# Patient Record
Sex: Male | Born: 1940 | Race: White | Hispanic: No | Marital: Married | Smoking: Never smoker
Health system: Southern US, Community
[De-identification: ages and names within clinical notes are randomized; demographics above are authoritative.]

## PROBLEM LIST (undated history)

## (undated) DIAGNOSIS — M1990 Unspecified osteoarthritis, unspecified site: Secondary | ICD-10-CM

## (undated) DIAGNOSIS — K219 Gastro-esophageal reflux disease without esophagitis: Secondary | ICD-10-CM

## (undated) DIAGNOSIS — Z8639 Personal history of other endocrine, nutritional and metabolic disease: Secondary | ICD-10-CM

## (undated) DIAGNOSIS — Z87442 Personal history of urinary calculi: Secondary | ICD-10-CM

## (undated) DIAGNOSIS — M1712 Unilateral primary osteoarthritis, left knee: Secondary | ICD-10-CM

## (undated) DIAGNOSIS — F0390 Unspecified dementia without behavioral disturbance: Secondary | ICD-10-CM

## (undated) DIAGNOSIS — K589 Irritable bowel syndrome without diarrhea: Secondary | ICD-10-CM

## (undated) DIAGNOSIS — I10 Essential (primary) hypertension: Secondary | ICD-10-CM

## (undated) DIAGNOSIS — K449 Diaphragmatic hernia without obstruction or gangrene: Secondary | ICD-10-CM

## (undated) DIAGNOSIS — I480 Paroxysmal atrial fibrillation: Secondary | ICD-10-CM

## (undated) DIAGNOSIS — Z9989 Dependence on other enabling machines and devices: Secondary | ICD-10-CM

## (undated) DIAGNOSIS — N200 Calculus of kidney: Secondary | ICD-10-CM

## (undated) DIAGNOSIS — N201 Calculus of ureter: Secondary | ICD-10-CM

## (undated) DIAGNOSIS — Z8709 Personal history of other diseases of the respiratory system: Secondary | ICD-10-CM

## (undated) DIAGNOSIS — G4733 Obstructive sleep apnea (adult) (pediatric): Secondary | ICD-10-CM

## (undated) HISTORY — DX: Unspecified dementia, unspecified severity, without behavioral disturbance, psychotic disturbance, mood disturbance, and anxiety: F03.90

## (undated) HISTORY — PX: EXTRACORPOREAL SHOCK WAVE LITHOTRIPSY: SHX1557

## (undated) HISTORY — PX: TONSILLECTOMY: SUR1361

## (undated) HISTORY — PX: KIDNEY STONE SURGERY: SHX686

## (undated) HISTORY — PX: CARPAL TUNNEL RELEASE: SHX101

## (undated) HISTORY — DX: Irritable bowel syndrome, unspecified: K58.9

## (undated) HISTORY — PX: CATARACT EXTRACTION W/ INTRAOCULAR LENS  IMPLANT, BILATERAL: SHX1307

---

## 1965-06-30 HISTORY — PX: THYROID LOBECTOMY: SHX420

## 1998-01-07 ENCOUNTER — Inpatient Hospital Stay (HOSPITAL_COMMUNITY): Admission: EM | Admit: 1998-01-07 | Discharge: 1998-01-09 | Payer: Self-pay

## 1999-08-01 ENCOUNTER — Ambulatory Visit (HOSPITAL_COMMUNITY): Admission: RE | Admit: 1999-08-01 | Discharge: 1999-08-01 | Payer: Self-pay | Admitting: Urology

## 1999-08-01 ENCOUNTER — Encounter: Payer: Self-pay | Admitting: Urology

## 1999-08-05 ENCOUNTER — Emergency Department (HOSPITAL_COMMUNITY): Admission: EM | Admit: 1999-08-05 | Discharge: 1999-08-05 | Payer: Self-pay | Admitting: Emergency Medicine

## 1999-08-06 ENCOUNTER — Encounter: Payer: Self-pay | Admitting: Emergency Medicine

## 2000-06-30 HISTORY — PX: OTHER SURGICAL HISTORY: SHX169

## 2001-01-26 ENCOUNTER — Encounter: Admission: RE | Admit: 2001-01-26 | Discharge: 2001-01-26 | Payer: Self-pay | Admitting: Gastroenterology

## 2001-01-26 ENCOUNTER — Encounter: Payer: Self-pay | Admitting: Gastroenterology

## 2003-05-23 ENCOUNTER — Encounter: Admission: RE | Admit: 2003-05-23 | Discharge: 2003-05-23 | Payer: Self-pay | Admitting: Gastroenterology

## 2004-06-30 HISTORY — PX: TRIGGER FINGER RELEASE: SHX641

## 2005-06-30 HISTORY — PX: OTHER SURGICAL HISTORY: SHX169

## 2006-03-26 ENCOUNTER — Inpatient Hospital Stay (HOSPITAL_COMMUNITY): Admission: RE | Admit: 2006-03-26 | Discharge: 2006-03-27 | Payer: Self-pay | Admitting: Orthopedic Surgery

## 2011-08-28 ENCOUNTER — Other Ambulatory Visit: Payer: Self-pay | Admitting: Orthopedic Surgery

## 2011-09-26 ENCOUNTER — Encounter (HOSPITAL_COMMUNITY): Payer: Self-pay | Admitting: Pharmacy Technician

## 2011-10-02 ENCOUNTER — Other Ambulatory Visit: Payer: Self-pay

## 2011-10-02 ENCOUNTER — Encounter (HOSPITAL_COMMUNITY)
Admission: RE | Admit: 2011-10-02 | Discharge: 2011-10-02 | Disposition: A | Discharge status: Home / Self Care | Payer: Medicare Other | Source: Ambulatory Visit | Attending: Orthopedic Surgery | Admitting: Orthopedic Surgery

## 2011-10-02 ENCOUNTER — Encounter (HOSPITAL_COMMUNITY): Payer: Self-pay

## 2011-10-02 ENCOUNTER — Encounter (HOSPITAL_COMMUNITY)
Admission: RE | Admit: 2011-10-02 | Discharge: 2011-10-02 | Disposition: A | Discharge status: Home / Self Care | Payer: Medicare Other | Source: Ambulatory Visit | Attending: Anesthesiology | Admitting: Anesthesiology

## 2011-10-02 HISTORY — DX: Gastro-esophageal reflux disease without esophagitis: K21.9

## 2011-10-02 HISTORY — DX: Essential (primary) hypertension: I10

## 2011-10-02 HISTORY — DX: Unspecified osteoarthritis, unspecified site: M19.90

## 2011-10-02 LAB — CBC
Platelets: 347 10*3/uL (ref 150–400)
RBC: 4.93 MIL/uL (ref 4.22–5.81)
WBC: 7.5 10*3/uL (ref 4.0–10.5)

## 2011-10-02 LAB — BASIC METABOLIC PANEL
Calcium: 9 mg/dL (ref 8.4–10.5)
GFR calc non Af Amer: 68 mL/min — ABNORMAL LOW (ref >90)
Potassium: 3.1 mEq/L — ABNORMAL LOW (ref 3.5–5.1)
Sodium: 140 mEq/L (ref 135–145)

## 2011-10-02 LAB — SEDIMENTATION RATE: Sed Rate: 14 mm/hr (ref 0–16)

## 2011-10-02 LAB — TYPE AND SCREEN
ABO/RH(D): O POS
Antibody Screen: NEGATIVE

## 2011-10-02 LAB — SURGICAL PCR SCREEN: MRSA, PCR: NEGATIVE

## 2011-10-02 MED ORDER — CHLORHEXIDINE GLUCONATE 4 % EX LIQD: 60.0000 mL | Freq: Once | CUTANEOUS | Status: DC

## 2011-10-02 NOTE — Progress Notes (Signed)
Spoke with Nicki Guadalajara at dr. Karma Greaser office ... She will fax sleep study .

## 2011-10-02 NOTE — Pre-Procedure Instructions (Signed)
20 Darrell Perez  10/02/2011  Your procedure is scheduled on:  10/09/11  Report to [facility] at 800amAM.  Call this number if you have problems the morning of surgery: [phone]   Remember:   Do not eat food:After Midnight.  May have clear liquids: up to 4 Hours before arrival.do not drink liquids after 400am the day of surgery  Clear liquids include soda, tea, black coffee, apple or grape juice, broth.  Take these medicines the morning of surgery with A SIP OF WATER: hydrocodone(vicodin)  lozol  protonix  pindolol   Do not wear jewelry, make-up or nail polish.  Do not wear lotions, powders, or perfumes. You may wear deodorant.  Do not shave 48 hours prior to surgery.  Do not bring valuables to the hospital.  Contacts, dentures or bridgework may not be worn into surgery.  Leave suitcase in the car. After surgery it may be brought to your room.  For patients admitted to the hospital, checkout time is 11:00 AM the day of discharge.   Patients discharged the day of surgery will not be allowed to drive home.  Name and phone number of your driver: [name]  [phone]  Special Instructions: CHG Shower Use Special Wash: 1/2 bottle night before surgery and 1/2 bottle morning of surgery.   Please read over the following fact sheets that you were given: Pain Booklet, Coughing and Deep Breathing, MRSA Information and Surgical Site Infection Prevention

## 2011-10-03 NOTE — Consult Note (Signed)
Anesthesia:  Patient is a 71 year old male scheduled for a right shoulder revision arthroplasty possible reverse shoulder arthroplasty on 10/09/11.  History includes HTN, OSA, GERD, arthritis, HH, kidney stones, asthma.  Smoking status was not assessed at PAT.  Labs noted. K 3.1  Cr 1.08. H/H 12.8/39.7.  CXR on 10/02/11 showed stable cardiopulmonary appearance with no new focal or acute abnormality seen.  EKG on 10/02/11 showed SB at 50 bpm, LVH.  It was not felt significantly changed form her EKGs from 2001 or 2007.    Plan to proceed.

## 2011-10-08 MED ORDER — CEFAZOLIN SODIUM-DEXTROSE 2-3 GM-% IV SOLR
2.0000 g | INTRAVENOUS | Status: AC
  Administered 2011-10-09: 2 g via INTRAVENOUS
  Filled 2011-10-08: qty 50

## 2011-10-09 ENCOUNTER — Encounter (HOSPITAL_COMMUNITY): Payer: Self-pay | Admitting: Vascular Surgery

## 2011-10-09 ENCOUNTER — Ambulatory Visit (HOSPITAL_COMMUNITY): Payer: Medicare Other | Admitting: Vascular Surgery

## 2011-10-09 ENCOUNTER — Encounter (HOSPITAL_COMMUNITY)
Admission: RE | Disposition: A | Discharge status: Home / Self Care | Payer: Self-pay | Source: Ambulatory Visit | Attending: Orthopedic Surgery

## 2011-10-09 ENCOUNTER — Encounter (HOSPITAL_COMMUNITY): Payer: Self-pay | Admitting: General Practice

## 2011-10-09 ENCOUNTER — Encounter (HOSPITAL_COMMUNITY): Payer: Self-pay | Admitting: Surgery

## 2011-10-09 ENCOUNTER — Inpatient Hospital Stay (HOSPITAL_COMMUNITY)
Admission: RE | Admit: 2011-10-09 | Discharge: 2011-10-10 | DRG: 517 | Disposition: A | Discharge status: Home / Self Care | Payer: Medicare Other | Source: Ambulatory Visit | Attending: Orthopedic Surgery | Admitting: Orthopedic Surgery

## 2011-10-09 DIAGNOSIS — M25519 Pain in unspecified shoulder: Secondary | ICD-10-CM | POA: Diagnosis present

## 2011-10-09 DIAGNOSIS — T8489XA Other specified complication of internal orthopedic prosthetic devices, implants and grafts, initial encounter: Principal | ICD-10-CM | POA: Diagnosis present

## 2011-10-09 DIAGNOSIS — N200 Calculus of kidney: Secondary | ICD-10-CM | POA: Diagnosis present

## 2011-10-09 DIAGNOSIS — Z01818 Encounter for other preprocedural examination: Secondary | ICD-10-CM

## 2011-10-09 DIAGNOSIS — Z96619 Presence of unspecified artificial shoulder joint: Secondary | ICD-10-CM

## 2011-10-09 DIAGNOSIS — Y849 Medical procedure, unspecified as the cause of abnormal reaction of the patient, or of later complication, without mention of misadventure at the time of the procedure: Secondary | ICD-10-CM | POA: Diagnosis present

## 2011-10-09 DIAGNOSIS — Z01810 Encounter for preprocedural cardiovascular examination: Secondary | ICD-10-CM

## 2011-10-09 DIAGNOSIS — Z01812 Encounter for preprocedural laboratory examination: Secondary | ICD-10-CM

## 2011-10-09 HISTORY — PX: REVERSE SHOULDER ARTHROPLASTY: SHX5054

## 2011-10-09 SURGERY — ARTHROPLASTY, SHOULDER, TOTAL, REVERSE
Anesthesia: General | Site: Shoulder | Laterality: Right | Wound class: Clean

## 2011-10-09 MED ORDER — INDAPAMIDE 2.5 MG PO TABS
2.5000 mg | ORAL_TABLET | Freq: Every day | ORAL | Status: DC
  Administered 2011-10-09 – 2011-10-10 (×2): 2.5 mg via ORAL
  Filled 2011-10-09 (×3): qty 1

## 2011-10-09 MED ORDER — ACETAMINOPHEN 10 MG/ML IV SOLN
INTRAVENOUS | Status: AC
  Filled 2011-10-09: qty 100

## 2011-10-09 MED ORDER — MIDAZOLAM HCL 2 MG/2ML IJ SOLN
INTRAMUSCULAR | Status: AC
  Filled 2011-10-09: qty 2

## 2011-10-09 MED ORDER — CEFAZOLIN SODIUM 1-5 GM-% IV SOLN
1.0000 g | Freq: Four times a day (QID) | INTRAVENOUS | Status: AC
  Administered 2011-10-09 – 2011-10-10 (×3): 1 g via INTRAVENOUS
  Filled 2011-10-09 (×4): qty 50

## 2011-10-09 MED ORDER — LACTATED RINGERS IV SOLN
INTRAVENOUS | Status: DC | PRN
  Administered 2011-10-09 (×3): via INTRAVENOUS

## 2011-10-09 MED ORDER — LIDOCAINE HCL 4 % MT SOLN
OROMUCOSAL | Status: DC | PRN
  Administered 2011-10-09: 4 mL via TOPICAL

## 2011-10-09 MED ORDER — ONDANSETRON HCL 4 MG PO TABS: 4.0000 mg | ORAL_TABLET | Freq: Four times a day (QID) | ORAL | Status: DC | PRN

## 2011-10-09 MED ORDER — ACETAMINOPHEN 325 MG PO TABS: 650.0000 mg | ORAL_TABLET | Freq: Four times a day (QID) | ORAL | Status: DC | PRN

## 2011-10-09 MED ORDER — LACTATED RINGERS IV SOLN
INTRAVENOUS | Status: DC
  Administered 2011-10-09: 50 mL/h via INTRAVENOUS

## 2011-10-09 MED ORDER — ALLOPURINOL 100 MG PO TABS
100.0000 mg | ORAL_TABLET | Freq: Every day | ORAL | Status: DC
  Administered 2011-10-09: 100 mg via ORAL
  Filled 2011-10-09 (×3): qty 1

## 2011-10-09 MED ORDER — LACTATED RINGERS IV SOLN
INTRAVENOUS | Status: DC
  Administered 2011-10-10: 02:00:00 via INTRAVENOUS

## 2011-10-09 MED ORDER — ACETAMINOPHEN 10 MG/ML IV SOLN
INTRAVENOUS | Status: DC | PRN
  Administered 2011-10-09: 1000 mg via INTRAVENOUS

## 2011-10-09 MED ORDER — PROPOFOL 10 MG/ML IV EMUL
INTRAVENOUS | Status: DC | PRN
  Administered 2011-10-09: 160 mg via INTRAVENOUS

## 2011-10-09 MED ORDER — ROCURONIUM BROMIDE 100 MG/10ML IV SOLN
INTRAVENOUS | Status: DC | PRN
  Administered 2011-10-09: 50 mg via INTRAVENOUS

## 2011-10-09 MED ORDER — PANTOPRAZOLE SODIUM 40 MG PO TBEC
40.0000 mg | DELAYED_RELEASE_TABLET | Freq: Every day | ORAL | Status: DC
  Administered 2011-10-09 – 2011-10-10 (×2): 40 mg via ORAL
  Filled 2011-10-09 (×2): qty 1

## 2011-10-09 MED ORDER — HYDROMORPHONE HCL PF 1 MG/ML IJ SOLN: 0.5000 mg | INTRAMUSCULAR | Status: DC | PRN

## 2011-10-09 MED ORDER — METHOCARBAMOL 500 MG PO TABS
500.0000 mg | ORAL_TABLET | Freq: Four times a day (QID) | ORAL | Status: DC | PRN
  Administered 2011-10-10: 500 mg via ORAL
  Filled 2011-10-09: qty 1

## 2011-10-09 MED ORDER — FENTANYL CITRATE 0.05 MG/ML IJ SOLN
INTRAMUSCULAR | Status: DC | PRN
  Administered 2011-10-09: 75 ug via INTRAVENOUS

## 2011-10-09 MED ORDER — METOCLOPRAMIDE HCL 5 MG/ML IJ SOLN: 5.0000 mg | Freq: Three times a day (TID) | INTRAMUSCULAR | Status: DC | PRN

## 2011-10-09 MED ORDER — SUFENTANIL CITRATE 50 MCG/ML IV SOLN
INTRAVENOUS | Status: DC | PRN
  Administered 2011-10-09 (×3): 10 ug via INTRAVENOUS

## 2011-10-09 MED ORDER — OXYCODONE-ACETAMINOPHEN 5-325 MG PO TABS
1.0000 | ORAL_TABLET | ORAL | Status: DC | PRN
  Administered 2011-10-10 (×2): 2 via ORAL
  Filled 2011-10-09 (×2): qty 2

## 2011-10-09 MED ORDER — MIDAZOLAM HCL 5 MG/5ML IJ SOLN
INTRAMUSCULAR | Status: DC | PRN
  Administered 2011-10-09: 1 mg via INTRAVENOUS
  Administered 2011-10-09: 2 mg via INTRAVENOUS

## 2011-10-09 MED ORDER — EPHEDRINE SULFATE 50 MG/ML IJ SOLN
INTRAMUSCULAR | Status: DC | PRN
  Administered 2011-10-09 (×5): 10 mg via INTRAVENOUS

## 2011-10-09 MED ORDER — ZOLPIDEM TARTRATE 5 MG PO TABS: 5.0000 mg | ORAL_TABLET | Freq: Every evening | ORAL | Status: DC | PRN

## 2011-10-09 MED ORDER — SODIUM CHLORIDE 0.9 % IR SOLN
Status: DC | PRN
  Administered 2011-10-09: 1000 mL

## 2011-10-09 MED ORDER — GLYCOPYRROLATE 0.2 MG/ML IJ SOLN
INTRAMUSCULAR | Status: DC | PRN
  Administered 2011-10-09: 0.4 mg via INTRAVENOUS
  Administered 2011-10-09: 0.2 mg via INTRAVENOUS

## 2011-10-09 MED ORDER — DEXAMETHASONE SODIUM PHOSPHATE 4 MG/ML IJ SOLN
INTRAMUSCULAR | Status: DC | PRN
  Administered 2011-10-09: 4 mg via INTRAVENOUS

## 2011-10-09 MED ORDER — METHOCARBAMOL 100 MG/ML IJ SOLN
500.0000 mg | Freq: Four times a day (QID) | INTRAVENOUS | Status: DC | PRN
  Filled 2011-10-09: qty 5

## 2011-10-09 MED ORDER — ACETAMINOPHEN 650 MG RE SUPP: 650.0000 mg | Freq: Four times a day (QID) | RECTAL | Status: DC | PRN

## 2011-10-09 MED ORDER — FENTANYL CITRATE 0.05 MG/ML IJ SOLN
INTRAMUSCULAR | Status: AC
  Filled 2011-10-09: qty 2

## 2011-10-09 MED ORDER — ASPIRIN EC 81 MG PO TBEC
81.0000 mg | DELAYED_RELEASE_TABLET | Freq: Every day | ORAL | Status: DC
  Administered 2011-10-09 – 2011-10-10 (×2): 81 mg via ORAL
  Filled 2011-10-09 (×2): qty 1

## 2011-10-09 MED ORDER — MENTHOL 3 MG MT LOZG: 1.0000 | LOZENGE | OROMUCOSAL | Status: DC | PRN

## 2011-10-09 MED ORDER — NEOSTIGMINE METHYLSULFATE 1 MG/ML IJ SOLN
INTRAMUSCULAR | Status: DC | PRN
  Administered 2011-10-09: 3 mg via INTRAVENOUS

## 2011-10-09 MED ORDER — ONDANSETRON HCL 4 MG/2ML IJ SOLN: 4.0000 mg | Freq: Four times a day (QID) | INTRAMUSCULAR | Status: DC | PRN

## 2011-10-09 MED ORDER — DROPERIDOL 2.5 MG/ML IJ SOLN
INTRAMUSCULAR | Status: DC | PRN
  Administered 2011-10-09: 0.625 mg via INTRAVENOUS

## 2011-10-09 MED ORDER — METOCLOPRAMIDE HCL 10 MG PO TABS: 5.0000 mg | ORAL_TABLET | Freq: Three times a day (TID) | ORAL | Status: DC | PRN

## 2011-10-09 MED ORDER — PINDOLOL 5 MG PO TABS
5.0000 mg | ORAL_TABLET | Freq: Every day | ORAL | Status: DC
  Administered 2011-10-09 – 2011-10-10 (×2): 5 mg via ORAL
  Filled 2011-10-09 (×2): qty 1

## 2011-10-09 MED ORDER — ONDANSETRON HCL 4 MG/2ML IJ SOLN
INTRAMUSCULAR | Status: DC | PRN
  Administered 2011-10-09: 4 mg via INTRAVENOUS

## 2011-10-09 MED ORDER — ALBUMIN HUMAN 5 % IV SOLN
INTRAVENOUS | Status: DC | PRN
  Administered 2011-10-09: 12:00:00 via INTRAVENOUS

## 2011-10-09 MED ORDER — DOCUSATE SODIUM 100 MG PO CAPS
100.0000 mg | ORAL_CAPSULE | Freq: Two times a day (BID) | ORAL | Status: DC
  Administered 2011-10-09 – 2011-10-10 (×2): 100 mg via ORAL
  Filled 2011-10-09 (×3): qty 1

## 2011-10-09 MED ORDER — PHENOL 1.4 % MT LIQD: 1.0000 | OROMUCOSAL | Status: DC | PRN

## 2011-10-09 MED ORDER — DIPHENHYDRAMINE HCL 12.5 MG/5ML PO ELIX: 12.5000 mg | ORAL_SOLUTION | ORAL | Status: DC | PRN

## 2011-10-09 SURGICAL SUPPLY — 81 items
BIT DRILL 170X2.5X (BIT) ×1 IMPLANT
BIT DRL 170X2.5X (BIT) ×1
BLADE SAW SGTL 83.5X18.5 (BLADE) ×2 IMPLANT
BRUSH FEMORAL CANAL (MISCELLANEOUS) ×2 IMPLANT
CEMENT BONE DEPUY (Cement) ×3 IMPLANT
CLOTH BEACON ORANGE TIMEOUT ST (SAFETY) ×2 IMPLANT
CLSR STERI-STRIP ANTIMIC 1/2X4 (GAUZE/BANDAGES/DRESSINGS) ×1 IMPLANT
CONT SPEC 4OZ CLIKSEAL STRL BL (MISCELLANEOUS) ×1 IMPLANT
COVER SURGICAL LIGHT HANDLE (MISCELLANEOUS) ×2 IMPLANT
DRAPE INCISE IOBAN 66X45 STRL (DRAPES) ×2 IMPLANT
DRAPE SURG 17X11 SM STRL (DRAPES) ×2 IMPLANT
DRAPE U-SHAPE 47X51 STRL (DRAPES) ×2 IMPLANT
DRILL 2.5 (BIT) ×2
DRILL BIT 7/64X5 (BIT) ×1 IMPLANT
DRSG MEPILEX BORDER 4X8 (GAUZE/BANDAGES/DRESSINGS) IMPLANT
DURAPREP 26ML APPLICATOR (WOUND CARE) ×2 IMPLANT
ELECT BLADE 4.0 EZ CLEAN MEGAD (MISCELLANEOUS) ×2
ELECT CAUTERY BLADE 6.4 (BLADE) ×2 IMPLANT
ELECT REM PT RETURN 9FT ADLT (ELECTROSURGICAL) ×2
ELECTRODE BLDE 4.0 EZ CLN MEGD (MISCELLANEOUS) ×1 IMPLANT
ELECTRODE REM PT RTRN 9FT ADLT (ELECTROSURGICAL) ×1 IMPLANT
FACESHIELD LNG OPTICON STERILE (SAFETY) ×6 IMPLANT
GLOVE BIO SURGEON STRL SZ7.5 (GLOVE) ×2 IMPLANT
GLOVE BIO SURGEON STRL SZ8 (GLOVE) ×2 IMPLANT
GLOVE BIOGEL PI IND STRL 6.5 (GLOVE) IMPLANT
GLOVE BIOGEL PI IND STRL 8 (GLOVE) ×1 IMPLANT
GLOVE BIOGEL PI INDICATOR 6.5 (GLOVE) ×1
GLOVE BIOGEL PI INDICATOR 8 (GLOVE) ×1
GLOVE EUDERMIC 7 POWDERFREE (GLOVE) ×3 IMPLANT
GLOVE SS BIOGEL STRL SZ 6.5 (GLOVE) ×1 IMPLANT
GLOVE SS BIOGEL STRL SZ 7.5 (GLOVE) ×1 IMPLANT
GLOVE SUPERSENSE BIOGEL SZ 6.5 (GLOVE) ×1
GLOVE SUPERSENSE BIOGEL SZ 7.5 (GLOVE) ×2
GLOVE SURG SS PI 7.5 STRL IVOR (GLOVE) ×2 IMPLANT
GOWN STRL NON-REIN LRG LVL3 (GOWN DISPOSABLE) ×2 IMPLANT
GOWN STRL REIN XL XLG (GOWN DISPOSABLE) ×6 IMPLANT
HANDPIECE INTERPULSE COAX TIP (DISPOSABLE)
HUMERAL SPACER (Trauma) ×2 IMPLANT
KIT BASIN OR (CUSTOM PROCEDURE TRAY) ×2 IMPLANT
KIT ROOM TURNOVER OR (KITS) ×2 IMPLANT
MANIFOLD NEPTUNE II (INSTRUMENTS) ×2 IMPLANT
MARKER SKIN DUAL TIP RULER LAB (MISCELLANEOUS) ×2 IMPLANT
NDL HYPO 25GX1X1/2 BEV (NEEDLE) IMPLANT
NDL SUT 6 .5 CRC .975X.05 MAYO (NEEDLE) IMPLANT
NEEDLE HYPO 25GX1X1/2 BEV (NEEDLE) IMPLANT
NEEDLE MAYO TAPER (NEEDLE) ×2
NS IRRIG 1000ML POUR BTL (IV SOLUTION) ×2 IMPLANT
PACK SHOULDER (CUSTOM PROCEDURE TRAY) ×2 IMPLANT
PAD ARMBOARD 7.5X6 YLW CONV (MISCELLANEOUS) ×4 IMPLANT
PASSER SUT SWANSON 36MM LOOP (INSTRUMENTS) ×2 IMPLANT
PIN METAGLENE 2.5 (PIN) IMPLANT
PRESSURIZER FEMORAL UNIV (MISCELLANEOUS) IMPLANT
RESTRICTOR CEMENT PE SZ 2 (Cement) ×1 IMPLANT
SET HNDPC FAN SPRY TIP SCT (DISPOSABLE) IMPLANT
SLING ARM FOAM STRAP LRG (SOFTGOODS) ×1 IMPLANT
SLING ARM IMMOBILIZER LRG (SOFTGOODS) IMPLANT
SPACER HUMERAL (Trauma) IMPLANT
SPONGE LAP 18X18 X RAY DECT (DISPOSABLE) ×4 IMPLANT
SPONGE LAP 4X18 X RAY DECT (DISPOSABLE) ×1 IMPLANT
STRIP CLOSURE SKIN 1/2X4 (GAUZE/BANDAGES/DRESSINGS) ×1 IMPLANT
SUCTION FRAZIER TIP 10 FR DISP (SUCTIONS) ×2 IMPLANT
SUT BONE WAX W31G (SUTURE) ×1 IMPLANT
SUT FIBERWIRE #2 38 T-5 BLUE (SUTURE) ×4
SUT MNCRL AB 3-0 PS2 18 (SUTURE) ×3 IMPLANT
SUT VIC AB 1 CT1 27 (SUTURE) ×2
SUT VIC AB 1 CT1 27XBRD ANBCTR (SUTURE) ×3 IMPLANT
SUT VIC AB 2-0 CT1 27 (SUTURE) ×2
SUT VIC AB 2-0 CT1 TAPERPNT 27 (SUTURE) ×2 IMPLANT
SUT VIC AB 2-0 SH 27 (SUTURE)
SUT VIC AB 2-0 SH 27X BRD (SUTURE) IMPLANT
SUTURE FIBERWR #2 38 T-5 BLUE (SUTURE) ×2 IMPLANT
SWAB COLLECTION DEVICE MRSA (MISCELLANEOUS) ×1 IMPLANT
SYR 30ML SLIP (SYRINGE) ×1 IMPLANT
SYR BULB IRRIGATION 50ML (SYRINGE) ×2 IMPLANT
SYR CONTROL 10ML LL (SYRINGE) IMPLANT
TOWEL OR 17X24 6PK STRL BLUE (TOWEL DISPOSABLE) ×2 IMPLANT
TOWEL OR 17X26 10 PK STRL BLUE (TOWEL DISPOSABLE) ×2 IMPLANT
TOWER CARTRIDGE SMART MIX (DISPOSABLE) ×1 IMPLANT
TRAY FOLEY CATH 14FR (SET/KITS/TRAYS/PACK) IMPLANT
TUBE ANAEROBIC SPECIMEN COL (MISCELLANEOUS) ×2 IMPLANT
WATER STERILE IRR 1000ML POUR (IV SOLUTION) ×2 IMPLANT

## 2011-10-09 NOTE — Op Note (Signed)
10/09/2011  1:20 PM  PATIENT:   Darrell Perez  71 y.o. male  PRE-OPERATIVE DIAGNOSIS:  right shoulder painful hemiarthroplasty  POST-OPERATIVE DIAGNOSIS:  Same with find of glenoid erosion  PROCEDURE:  Conversion to reverse shoulder arthroplasty right  SURGEON:  Zora Glendenning, Vania Rea M.D.  ASSISTANTS: Shuford, pac   ANESTHESIA:   GET + ISB  EBL: 300  SPECIMEN:  Joint fluid for routine culture, tissue for frozen section  Drains: none   PATIENT DISPOSITION:  PACU - hemodynamically stable.    PLAN OF CARE: Admit to inpatient   Dictation# [id number]

## 2011-10-09 NOTE — Transfer of Care (Signed)
Immediate Anesthesia Transfer of Care Note  Patient: Darrell Perez  Procedure(s) Performed: Procedure(s) (LRB): REVERSE SHOULDER ARTHROPLASTY (Right)  Patient Location: PACU  Anesthesia Type: General  Level of Consciousness: sedated  Airway & Oxygen Therapy: Patient Spontanous Breathing  Post-op Assessment: Report given to PACU RN and Post -op Vital signs reviewed and stable  Post vital signs: Reviewed and stable  Complications: No apparent anesthesia complications

## 2011-10-09 NOTE — Anesthesia Procedure Notes (Addendum)
Anesthesia Regional Block:  Interscalene brachial plexus block  Pre-Anesthetic Checklist: ,, timeout performed, Correct Patient, Correct Site, Correct Laterality, Correct Procedure, Correct Position, site marked, Risks and benefits discussed,  Surgical consent,  Pre-op evaluation,  At surgeon's request and post-op pain management  Laterality: Right  Prep: chloraprep       Needles:  Injection technique: Single-shot  Needle Type: Stimulator Needle - 40          Additional Needles:  Procedures: Doppler guided and ultrasound guided Interscalene brachial plexus block  Nerve Stimulator or Paresthesia:  Response: 0.5 mA,   Additional Responses:   Narrative:  Start time: 10/09/2011 9:40 AM End time: 10/09/2011 10:00 AM  Performed by: Personally  Anesthesiologist: Dr. Chaney Malling   Anesthesia Regional Block:   Narrative:     LA: 0.5% marcaine with epi 1:200,000 50:50 1.5% lidocaine with epi 1:200,000 30 cc total with 5cc inc dosing. CE

## 2011-10-09 NOTE — Anesthesia Postprocedure Evaluation (Signed)
  Anesthesia Post-op Note  Patient: Darrell Perez  Procedure(s) Performed: Procedure(s) (LRB): REVERSE SHOULDER ARTHROPLASTY (Right)  Patient Location: PACU  Anesthesia Type: General  Level of Consciousness: awake  Airway and Oxygen Therapy: Patient Spontanous Breathing  Post-op Pain: mild  Post-op Assessment: Post-op Vital signs reviewed  Post-op Vital Signs: Reviewed  Complications: No apparent anesthesia complications 

## 2011-10-09 NOTE — H&P (Signed)
Darrell Perez    Chief Complaint: right shoulder painful hemiarthroplasty HPI: The patient is a 71 y.o. male s/p previous right shoulder hemiarthroplasty and now with progressively increasing right shoulder pain related to glenoid erosion.  Past Medical History  Diagnosis Date  . Hypertension   . Asthma     15-20 yrs ago  . Sleep apnea     2-3 yrs ago   . Kidney stones     chronic   . GERD (gastroesophageal reflux disease)   . H/O hiatal hernia 20 yrs ago     no problems now   . Arthritis     Past Surgical History  Procedure Date  . Tonsillectomy   . Eye surgery 2012   2005    cat ext bilateral   . Joint replacement 2007    right  shoulder   . Carpal tunnel release 89  91    bilateral releases  . Trigger finger release 2006    right  . 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 Kidney stone surgery 1972 and 1977    removal of stones.     Family History  Problem Relation Age of Onset  . Anesthesia problems Neg Hx   . Hypotension Neg Hx   . Malignant hyperthermia Neg Hx   . Pseudochol deficiency Neg Hx     Social History:  reports that he has never smoked. He does not have any smokeless tobacco history on file. He reports that he does not drink alcohol or use illicit drugs.  Allergies:  Allergies  Allergen Reactions  . Naprosyn (Naproxen) Other (See Comments)    "makes me jittery/nervous"    Medications Prior to Admission  Medication Dose Route Frequency Provider Last Rate Last Dose  . ceFAZolin (ANCEF) IVPB 2 g/50 mL premix  2 g Intravenous 60 min Pre-Op Ralene Bathe, PA      . lactated ringers infusion   Intravenous Continuous Ralene Bathe, PA 50 mL/hr at 10/09/11 0912 50 mL/hr at 10/09/11 0912   Medications Prior to Admission  Medication Sig Dispense Refill  . allopurinol (ZYLOPRIM) 100 MG tablet Take 100 mg by mouth daily at 12 noon.       [name] aspirin EC 81 MG tablet Take 81 mg by mouth daily.      . cholecalciferol (VITAMIN D) 1000 UNITS tablet Take  1,000 Units by mouth daily.      [name] HYDROcodone-acetaminophen (VICODIN) 5-500 MG per tablet Take 1 tablet by mouth daily as needed. For pain      . indapamide (LOZOL) 2.5 MG tablet Take 2.5 mg by mouth daily.      . pantoprazole (PROTONIX) 40 MG tablet Take 40 mg by mouth daily.      . pindolol (VISKEN) 5 MG tablet Take 5 mg by mouth daily.      . vitamin B-12 (CYANOCOBALAMIN) 1000 MCG tablet Take 1,000 mcg by mouth daily.         Physical Exam: Right shoulder examined with motion as noted at 2/25 13 office visit.[name] xrays with glenoid erosion  Vitals  Temp:  [97.9 F (36.6 C)] 97.9 F (36.6 C) (04/11 0807) Pulse Rate:  [47] 47  (04/11 0807) Resp:  [18] 18  (04/11 0807) BP: (135)/(79) 135/79 mmHg (04/11 0807) SpO2:  [97 %] 97 % (04/11 0807)  Assessment/Plan  Impression: right shoulder painful hemiarthroplasty  Plan of Action: Procedure(s): REVERSE SHOULDER ARTHROPLASTY vs conversion to total shoulder arthroplasty  Darrell Perez M 10/09/2011,  9:42 AM

## 2011-10-09 NOTE — Anesthesia Preprocedure Evaluation (Addendum)
Anesthesia Evaluation  Patient identified by MRN, date of birth, ID band Patient awake    Reviewed: Allergy & Precautions, H&P , NPO status , Patient's Chart, lab work & pertinent test results  Airway Mallampati: II      Dental   Pulmonary asthma , sleep apnea ,  breath sounds clear to auscultation        Cardiovascular hypertension, Pt. on medications Rhythm:Regular Rate:Normal     Neuro/Psych negative neurological ROS     GI/Hepatic Neg liver ROS, hiatal hernia, GERD-  ,  Endo/Other  negative endocrine ROS  Renal/GU negative Renal ROS     Musculoskeletal   Abdominal   Peds  Hematology negative hematology ROS (+)   Anesthesia Other Findings   Reproductive/Obstetrics                          Anesthesia Physical Anesthesia Plan  ASA: III  Anesthesia Plan: General   Post-op Pain Management:    Induction: Intravenous  Airway Management Planned: Oral ETT  Additional Equipment:   Intra-op Plan:   Post-operative Plan: Extubation in OR  Informed Consent: I have reviewed the patients History and Physical, chart, labs and discussed the procedure including the risks, benefits and alternatives for the proposed anesthesia with the patient or authorized representative who has indicated his/her understanding and acceptance.   Dental advisory given  Plan Discussed with: CRNA, Anesthesiologist and Surgeon  Anesthesia Plan Comments:        Anesthesia Quick Evaluation

## 2011-10-09 NOTE — Anesthesia Postprocedure Evaluation (Signed)
  Anesthesia Post-op Note  Patient: Darrell Perez  Procedure(s) Performed: Procedure(s) (LRB): REVERSE SHOULDER ARTHROPLASTY (Right)  Patient Location: PACU  Anesthesia Type: General  Level of Consciousness: awake  Airway and Oxygen Therapy: Patient Spontanous Breathing  Post-op Pain: mild  Post-op Assessment: Post-op Vital signs reviewed  Post-op Vital Signs: Reviewed  Complications: No apparent anesthesia complications

## 2011-10-10 ENCOUNTER — Encounter (HOSPITAL_COMMUNITY): Payer: Self-pay | Admitting: Orthopedic Surgery

## 2011-10-10 MED ORDER — OXYCODONE-ACETAMINOPHEN 5-325 MG PO TABS: 1.0000 | ORAL_TABLET | ORAL | Status: AC | PRN

## 2011-10-10 MED ORDER — METHOCARBAMOL 500 MG PO TABS: 500.0000 mg | ORAL_TABLET | Freq: Three times a day (TID) | ORAL | Status: AC | PRN

## 2011-10-10 NOTE — Progress Notes (Signed)
Occupational Therapy Evaluation and Discharge Patient Details Name: Darrell Perez MRN: [id number] DOB: 12-17-1940 Today's Date: 10/10/2011  Problem List: There is no problem list on file for this patient.   Past Medical History:  Past Medical History  Diagnosis Date  . Hypertension   . Asthma     15-20 yrs ago  . Sleep apnea     2-3 yrs ago   . Kidney stones     chronic   . GERD (gastroesophageal reflux disease)   . H/O hiatal hernia 20 yrs ago     no problems now   . Arthritis    Past Surgical History:  Past Surgical History  Procedure Date  . Tonsillectomy   . Eye surgery 2012   2005    cat ext bilateral   . Joint replacement 2007    right  shoulder   . Carpal tunnel release 89  91    bilateral releases  . Trigger finger release 2006    right  . 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 Kidney stone surgery 1972 and 1977    removal of stones.   . Reverse shoulder arthroplasty 10/09/2011   *See shadow chart for full evaluation*  OT Assessment/Plan/Recommendation  Pt. Moving well and recommend D/C home with wife assist PRN.      Asahd Can, OTR/L Pager [phone] 10/10/2011, 12:00 PM

## 2011-10-10 NOTE — Op Note (Signed)
NAMEMarland Perez  Darrell Perez, PRESSNELL NO.:  [id number]  MEDICAL RECORD NO.:  [id number]  LOCATION:  5002                         FACILITY:  [facility]  PHYSICIAN:  Darrell Perez. Darrell Perez, M.D.  DATE OF BIRTH:  1941-02-15  DATE OF PROCEDURE:  10/09/2011 DATE OF DISCHARGE:                              OPERATIVE REPORT   PREOPERATIVE DIAGNOSIS:  Painful right shoulder hemiarthroplasty.  POSTOPERATIVE DIAGNOSIS:  Painful right shoulder hemiarthroplasty with intraoperative findings of severe erosion of the glenoid.  PROCEDURE:  Right shoulder conversion to a reverse shoulder arthroplasty with removal of the initial implant.  SURGEON:  Darrell Perez. Darrell Glassco, MD  ASSISTANT:  Darrell Perez. Shuford, PA-C  ANESTHESIA:  General endotracheal as well as an interscalene block.  ESTIMATED BLOOD LOSS:  300 mL.  DRAINS:  None.  SPECIMENS:  Joint fluid was sent for routine culture.  Also sent tissue for frozen section, which did come back as normal with no evidence for increased cellularity.  HISTORY:  Darrell Perez is a 71 year old gentleman who is status post a previous right shoulder hemiarthroplasty for glenohumeral arthrosis.  At the time, it was found to be predominantly involving the humeral head, but has unfortunately gone onto progressive increasing right shoulder pain with restricted mobility with current radiograph showing marked erosion of glenoid and medialization.  He has also had significantly increased pain as well as functional limitations.  He was brought to the operating room at this time for planned right shoulder conversion to total shoulder arthroplasty versus conversion to a reverse shoulder arthroplasty.  Preoperatively, I counseled Darrell Perez on treatment options as well as risks versus benefits thereof.  Possible surgical complications were reviewed including the potential for bleeding, infection, neurovascular injury, persistent pain, loss of motion, failure of the  implant, anesthetic complication, possible need for additional surgery.  He understands and accepts and agrees with our planned procedure.  PROCEDURE IN DETAIL:  After undergoing routine preop evaluation, the patient received prophylactic antibiotics.  Brought to the operating room and placed supine on the operating table, underwent smooth induction of a general endotracheal anesthesia.  Placed in the beach- chair position and appropriately padded and protected.  The right shoulder region was then sterilely prepped and draped in standard fashion.  Time-out was called.  An anterior 20-cm incision was made along the deltopectoral interval along the lines of the previous incision beginning at the coracoid extending distally and laterally. Skin flaps were elevated, and electrocautery was used for hemostasis. We then identified the deltopectoral interval, which was markedly scarified related to his previous surgery.  This was then developed from proximal to distal.  Adhesions throughout the subacromial/subdeltoid bursa were divided and also mobilized the pectoralis and then identified and mobilized the conjoined tendon and dissected this free from the underlying subscapularis.  Dissection carried distally and the upper 2 cm of the pec major was tenotomized and we also dissected subperiosteally about the humeral neck to allow delivery of the humeral head.  We then dissected the subscapularis away from the lesser tuberosity and then split the rotator interval back to the base of the coracoid and then tagged the free margin of the subscapularis with a series of  grasping #2 FiberWire sutures.  We did find, however, that the quality of the subscapularis tissue was markedly compromised with very little elasticity suggesting that the muscle had scarified with fatty infiltration and atrophy.  We were subsequently able to deliver the humeral head and again completed the resection, subperiosteal  dissection of the soft tissues from the neck of the humerus back to the 4 o'clock position.  The rotator cuff tissue superiorly appeared significantly attenuated and once again did not appear to be any significant elasticity of the rotator cuff tissues suggesting quite dysfunctional and compromised tissues, and given these considerations, we elected to proceed with a reverse shoulder arthroplasty.  With this in mind, we went ahead and dissected the superior aspect of the rotator cuff from the greater tuberosity and allowed this to retract and then was excised. We then delivered the implant through the wound, removed the humeral head and then used osteotomes to sequentially free up the press-fit implant and this was quite well fixed unfortunately and required significant bony released and ultimately had defects anteriorly and posteriorly of the metaphyseal section of the humerus, but we were ultimately able to remove the implant.  However, given the compromise of the metaphyseal bone, we elected to proceed with a long-stem implantation.  We identified the humeral canal and there had been a pedestal that formed to the distal aspect of the stems.  We broke through this with a 6-mm reamer.  I then performed sequential hand reaming up to size 12, which coincided with that stent that we had removed.  We then confirmed that we could properly seat a 12 reamer to the appropriate depth into the humerus.  Following this, we placed a trial stem into the humeral canal to allow protection of the remaining metaphyseal bone as we performed our exposure of the glenoid.  We then exposed the glenoid with a combination of Fukuda and snake tongue retractors.  We performed a circumferential resection of the soft tissues.  It should mention that we removed synovial tissue that had infiltrated around the humeral head and stem interface and this was sent for frozen section that came back with a normal cell count  per high- power field.  We then completed our circumferential labral and capsular resection about the glenoid and then placed our central guidepin, reamed the glenoid to subchondral base and the peripheral reamer was then used to remove residual bone at the margins of the glenoid.  The glenoid baseplate was then inserted after the central drill hole created, impacted and then we placed the peripheral locking screws using standard technique with excellent bony purchase and fixation.  The posterior screw was nonlocking, the other three screws were all locking.  We then used a 42 eccentric glenosphere and this was passed over the guidewire onto the glenosphere.  It should onto the baseplate, was impacted and serially tightened with excellent fixation.  At this point, we then removed the retractors, irrigated the joint, we turned our attention to the proximal humerus where we again confirmed proper size of the canal, placed a distal cement plug, cleaned and dried the canal, neck cement and introduced the cement into the canal in retrograde fashion, and placed our long stem size 12 humeral stem.  This was placed at 0 degree retroversion.  This was set to the appropriate height.  Once the cement had hardened, we removed all extra cement.  We then performed trial reductions and ultimately found that a +9 extender and a +3 poly  gave [place] the proper soft tissue balance.  The final +9 extender and +3 poly were impacted into position.  Final reduction was performed.  We found good soft tissue balance about the shoulder with excellent stability and good motion.  The wounds were then irrigated.  Hemostasis was obtained.  We elected not to repair the subscap due to his poor quality and loss of proximal bone.  The deltopectoral interval was then reapproximated with 0 Vicryl, 2-0 Vicryl was used for the subcu layer and intracuticular 3-0 Monocryl for the skin followed by Steri-Strips.  Dry dressing  was applied.  Ralene Bathe, PA-C, was utilized throughout this case as a Product manager for assistance with positioning of the extremity, exposure, retraction, manipulation of the tissues, implantation of the implant, wound closure and intraoperative decision making.  The right arm was then placed in a sling.  The patient was awakened, extubated, and taken to the recovery room in stable condition.     Darrell Perez. Gunner Iodice, M.D.     KMS/MEDQ  D:  10/09/2011  T:  10/10/2011  Job:  [id number]

## 2011-10-10 NOTE — Discharge Instructions (Signed)
   Darrell Perez. Supple, M.D., F.A.A.O.S. Orthopaedic Surgery Specializing in Arthroscopic and Reconstructive Surgery of the Shoulder and Knee [phone] [address] [place], [place] [address] - Fax [phone]   POST-OP TOTAL SHOULDER REPLACEMENT/SHOULDER HEMIARTHROPLASTY INSTRUCTIONS  1. Call the office at [phone] to schedule your first post-op appointment 10-14 days from the date of your surgery.  2. Leave the steri-strips in place over your incisions when performing dressing changes and showering. You may remove your dressings and begin showering on day 5 from surgery. You can expect drainage that is bloody or yellow in nature that should gradually decrease from day of surgery. Change your dressing daily until drainage is completely resolved, then you may feel free to go without a bandage. You can also expect significant bruising around your shoulder that will drift down your arm and into your chest wall. This is very normal and should resolve over several days.  3. Wear your sling  except to perform the exercises below or to occasionally let your arm dangle by your side to stretch your elbow. You also need to sleep in your sling until instructed otherwise.  4. Range of motion to your elbow, wrist, and hand are encouraged 3-5 times daily. Exercise to your hand and fingers helps to reduce swelling you may experience.  5. Utilize ice to the shoulder 3-5 times minimum a day and additionally if you are experiencing pain.  6. Prescriptions for a pain medication and a muscle relaxant are provided for you. It is recommended that if you are experiencing pain that you pain medication alone is not controlling, add the muscle relaxant along with the pain medication which can give additional pain relief. The first 1-2 days is generally the most severe of your pain and then should gradually decrease. As your pain lessens it is recommended that you decrease your use of the pain  medications to an "as needed basis'" only and to always comply with the recommended dosages of the pain medications.  7. Pain medications can produce constipation along with their use. If you experience this, the use of an over the counter stool softener or laxative daily is recommended.   8. For most patients, if insurance allows, home health services to include therapy has been arranged.  9. For additional questions or concerns, please do not hesitate to call the office. If after hours there is an answering service to forward your concerns to the physician on call.  POST-OP EXERCISES  Pendulum Exercises  Perform pendulum exercises while standing and bending at the waist. Support your uninvolved arm on a table or chair and allow your operated arm to hang freely. Make sure to do these exercises passively - not using you shoulder muscles.  Repeat 20 times. Do 3 sessions per day.

## 2011-10-10 NOTE — Discharge Summary (Signed)
  PATIENT ID:      Darrell Perez  MRN:     [id number] DOB/AGE:    June 09, 1941 / 71 y.o.     DISCHARGE SUMMARY  ADMISSION DATE:    10/09/2011 DISCHARGE DATE:   10/10/2011   ADMISSION DIAGNOSIS: right shoulder painful hemiarthroplasty  (right shoulder painful hemiarthroplasty)  DISCHARGE DIAGNOSIS:  right shoulder painful hemiarthroplasty    ADDITIONAL DIAGNOSIS: Active Problems:  * No active hospital problems. *   Past Medical History  Diagnosis Date  . Hypertension   . Asthma     15-20 yrs ago  . Sleep apnea     2-3 yrs ago   . Kidney stones     chronic   . GERD (gastroesophageal reflux disease)   . H/O hiatal hernia 20 yrs ago     no problems now   . Arthritis     PROCEDURE: Procedure(s): REVERSE SHOULDER ARTHROPLASTY on 10/09/2011    HISTORY:  See H&P in chart  HOSPITAL COURSE:  Darrell Perez is a 71 y.o. admitted on 10/09/2011 and found to have a diagnosis of right shoulder painful hemiarthroplasty.  After appropriate laboratory studies were obtained  they were taken to the operating room on 10/09/2011 and underwent Procedure(s): REVERSE SHOULDER ARTHROPLASTY.   They were given perioperative antibiotics:  Anti-infectives     Start     Dose/Rate Route Frequency Ordered Stop   10/09/11 1600   ceFAZolin (ANCEF) IVPB 1 g/50 mL premix        1 g 100 mL/hr over 30 Minutes Intravenous Every 6 hours 10/09/11 1551 10/10/11 0450   10/08/11 1425   ceFAZolin (ANCEF) IVPB 2 g/50 mL premix        2 g 100 mL/hr over 30 Minutes Intravenous 60 min pre-op 10/08/11 1425 10/09/11 1055        . Blood products given:none  Pt was doing extremely well pod1 following surgery. He was NVI with scant bloody drainage The remainder of the hospital course was dedicated to ambulation and strengthening.   The patient was discharged on 1 Day Post-Op in  Good condition.

## 2011-10-13 LAB — BODY FLUID CULTURE

## 2011-10-14 LAB — ANAEROBIC CULTURE

## 2012-05-26 ENCOUNTER — Other Ambulatory Visit: Payer: Self-pay | Admitting: Dermatology

## 2012-06-07 ENCOUNTER — Other Ambulatory Visit: Payer: Self-pay | Admitting: Orthopedic Surgery

## 2012-07-08 ENCOUNTER — Encounter (HOSPITAL_COMMUNITY): Payer: Self-pay | Admitting: Pharmacy Technician

## 2012-07-13 ENCOUNTER — Other Ambulatory Visit: Payer: Self-pay | Admitting: Orthopedic Surgery

## 2012-07-13 NOTE — Patient Instructions (Signed)
Darrell Perez  07/13/2012   Your procedure is scheduled on:  07/22/12   Report to [facility] Stay [facility] at 0530 AM.  Call this number if you have problems the morning of surgery: [phone]   Remember:   Do not eat food or drink liquids after midnight.   Take these medicines the morning of surgery with A SIP OF WATER:    Do not wear jewelry,   Do not wear lotions, powders, or perfumes.                Men may shave face and neck.  Do not bring valuables to the hospital.  Contacts, dentures or bridgework may not be worn into surgery.  Leave suitcase in the car. After surgery it may be brought to your room.  For patients admitted to the hospital, checkout time is 11:00 AM the day of  discharge.     SEE CHG INSTRUCTION SHEET    Please read over the following fact sheets that you were given: MRSA Information, Incentive Spirometry FAct sheet, Blood Transfusion Fact Sheet, coughing and deep breathing exercises, leg exercises .               Failure to comply with these instructions may result in cancellation of your surgery.                Patient Signature _________________________             Nurse Signature __________________________

## 2012-07-13 NOTE — H&P (Signed)
Darrell Perez DOB: 11/17/1940 Married / Language: English / Race: White Male  H&P date: 07/13/12  Chief Complaint: right knee pain  History of Present Illness The patient is a 71 year old male who comes in today for a preoperative History and Physical. The patient is scheduled for a right total knee arthroplasty to be performed by Dr. Jeffrey C. Beane, MD at [facility] on 07/22/2012. They are now 4 years out from when symptoms began. He reports bilateral knee pain, right worse than left, worse with activities, better with rest, recently aggravated. Symptoms reported today include: pain and giving way (occasional). Pt reports their pain level to be moderate. Current treatment includes: NSAIDs (Advil prn) and pain medications (Hydrocodone or Oxycodone prn, most recently Hydrocodone). The patient has reported improvement of their symptoms with: Cortisone injections intermittently, last injection in September 2013. He has worked on quad strengthening exercises. He has worn a knee brace as needed for support.  Dr. Beane and the patient have mutually agreed to proceed with a right total knee replacement. Risks and benefits of the procedure were discussed including stiffness, suboptimal range of motion, persistent pain, infection requiring removal of prosthesis and reinsertion, need for prophylactic antibiotics in the future, for example, dental procedures, possible need for manipulation, revision in the future and also anesthetic complications including DVT, PE, etc. We discussed the perioperative course, time in the hospital, postoperative recovery and the need for elevation to control swelling. We also discussed the predicted range of motion and the probability that squatting and kneeling would be unobtainable in the future. In addition, postoperative anticoagulation was discussed. We have obtained preoperative medical clearance as necessary (Dr. James Osborne, PCP). Provided illustrated  handout and discussed it in detail. They will enroll in the total joint replacement educational forum at the hospital.  PAT appt at [facility] scheduled for tomorrow, 07/14/12.   Past MedicalHx Degeneration, lumbar/lumbosacral disc  Plantar fasciitis Osteoarthrosis, shoulder  Trigger finger s/p release Tear, medial meniscus, knee  High blood pressure Kidney Stone Asthma Gastroesophageal Reflux Disease Sleep Apnea. uses CPAP Impaired Hearing Vertigo. intermittent and transient Cataract. s/p surgery Hiatal Hernia Hemorrhoids Osteoarthritis B/L knees Measles Mumps   Allergies Naprosyn *ANALGESICS - ANTI-INFLAMMATORY*. jittery, nervous   Family History Heart Disease. mother Hypertension. mother Cerebrovascular Accident. mother   Social History Tobacco use. Never smoker. never smoker Living situation. lives with spouse. one story home with 4 steps to enter home. has walker at home. Current work status. retired accountant Exercise. Exercises monthly; does running / walking Alcohol use. current drinker; drinks beer and wine; only occasionally per week Tobacco / smoke exposure. no Children. 2 Marital status. married Advance Directives. Living Will   Medication History Meperidine HCl (50MG Tablet, Oral) Active. Pantoprazole Sodium (40MG Tablet DR, Oral) Active. Pindolol (5MG Tablet, Oral) Active. Indapamide (2.5MG Tablet, Oral) Active. Allopurinol (100MG Tablet, Oral) Active. Aspirin EC (81MG Tablet DR, Oral) Active. Potassium ( Oral) Specific dose unknown - Active. Tamsulosin HCl (0.4MG Capsule, Oral) Active. Vitamin D3 (1000UNIT Capsule, Oral) Active. Amoxicillin as needed for dental ppx s/p reverse TSA   Past Surgical History Carpal Tunnel Repair. bilateral- 1989, 1991 Arthroscopy of Shoulder. right Cataract Surgery. bilateral- 2005, 2012 Lithotripsy. kidney stones 1972, 1977 Hand Surgery. trigger finger release 2006 Shoulder  Surgery. right shoulder hemiarthroplasty 2007, conversion R shoulder hemi to reverse shoulder arthroplasty 2013 Neck Surgery. goiter, 1967   Review of Systems General:Not Present- Chills, Fever, Night Sweats, Fatigue, Weight Gain, Weight Loss and Memory Loss. Skin:Present- Itching.   Not Present- Hives, Rash, Eczema and Lesions. HEENT:Present- Hearing Loss. Not Present- Tinnitus, Headache, Double Vision, Visual Loss and Dentures. Respiratory:Not Present- Shortness of breath with exertion, Shortness of breath at rest, Allergies, Coughing up blood and Chronic Cough. Note:last CXR 10/09/11 Cardiovascular:Not Present- Chest Pain, Racing/skipping heartbeats, Difficulty Breathing Lying Down, Murmur, Swelling and Palpitations. Note:last EKG 10/09/11 Gastrointestinal:Present- Diarrhea. Not Present- Bloody Stool, Heartburn, Abdominal Pain, Vomiting, Nausea, Constipation, Difficulty Swallowing, Jaundice and Loss of appetitie. Male Genitourinary:Present- Urinating at Night. Not Present- Urinary frequency, Blood in Urine, Weak urinary stream, Discharge, Flank Pain, Incontinence, Painful Urination, Urgency and Urinary Retention. Musculoskeletal:Present- Joint Pain and Back Pain (intermittent, with increased activity, denies current back pain). Not Present- Muscle Weakness, Muscle Pain, Joint Swelling, Morning Stiffness and Spasms. Neurological:Present- Dizziness (intermittent and transient, denies any current dizziness). Not Present- Tremor, Blackout spells, Paralysis, Difficulty with balance and Weakness. Psychiatric:Not Present- Insomnia.   Vitals 07/13/2012 9:23 AM Weight: 214 lb Height: 68 in Body Surface Area: 2.16 m Body Mass Index: 32.54 kg/m Pulse: 57 (Regular) BP: 146/61 (Sitting, Left Arm, Standard)  Physical Exam The physical exam findings are as follows:  General Mental Status - Alert, cooperative and good historian. General Appearance- pleasant. Not in acute  distress. Orientation- Oriented X3. Build & Nutrition- Well nourished and Well developed. Gait- Limping.  Head and Neck Head- normocephalic, atraumatic . Neck Global Assessment- supple. no bruit auscultated on the right and no bruit auscultated on the left  Eye Pupil- Bilateral- Regular and Round. Motion- Bilateral- EOMI.  Chest and Lung Exam Auscultation: Breath sounds:- clear at anterior chest wall and - clear at posterior chest wall. Adventitious sounds:- No Adventitious sounds.  Cardiovascular Auscultation:Rhythm- Regular rate and rhythm. Heart Sounds- S1 WNL and S2 WNL. Murmurs & Other Heart Sounds:Auscultation of the heart reveals - No Murmurs.  Abdomen Palpation/Percussion:Tenderness- Abdomen is non-tender to palpation. Rigidity (guarding)- Abdomen is soft. Auscultation:Auscultation of the abdomen reveals - Bowel sounds normal.  Male Genitourinary Note: Not done, not pertinent to present illness  Musculoskeletal Note: On exam he is tender along the medial joint line on the right knee as well as the left. Patellofemoral pain with compression. Trace effusion is noted bilaterally. On the right range is -5 to 90 and on the left -3 to 120. Knee exam on inspection reveals no evidence of soft tissue swelling, ecchymosis, deformity, or erythema. On palpation there is no tenderness in the lateral joint line. Nontender over the fibular head of the peroneal nerve. Nontender over the quadriceps insertion of the patellar ligament insertion. Provocative maneuvers revealed a negative Lachman, negative anterior and posterior drawer, and a negative McMurray's. No instability was noted with varus and valgus stressing at 0 or 30 degrees. On manual motor test the quadriceps and hamstrings were 5/5. Sensory exam was intact to light touch.  Assessment & Plan Osteoarthrosis right knee  Note: X-rays (January 2013) demonstrate endstage osteoarthrosis medial compartment  bilaterally, left greater than right.  Pt is scheduled for right total knee arthroplasty by Dr. Beane 07/22/12. He has been cleared medically by his PCP Dr. James Osborne. We discussed R TKA procedure itself as well as risks, complications, and alternatives including but not limited to DVT, PE, infx, bleeding, failure of procedure, need for secondary procedure, nerve injury, anesthesia risk, even death. Pt desires to proceed with surgery as scheduled next week. All questions answered. He will continue with Norco as needed for pain pre-op (refilled today). Plan to D/C from hospital with Percocet as needed for pain as well   as Robaxin prn. Pt will be placed on Xarelto post-op for DVT ppx. Hold ASA and NSAIDs at this point. Remain NPO after MN night before surgery. Plan for D/C home with home health vs. SNF/rehab placement, pt and wife are open to both options depending on pain level and PT progress while inpt. Discussed post-op protocol. Pt will schedule follow up for 10-14 days post-op for suture removal.   Signed electronically by Surya Schroeter M Konstantin Lehnen PA-C        

## 2012-07-14 ENCOUNTER — Encounter (HOSPITAL_COMMUNITY): Payer: Self-pay

## 2012-07-14 ENCOUNTER — Encounter (HOSPITAL_COMMUNITY)
Admission: RE | Admit: 2012-07-14 | Discharge: 2012-07-14 | Disposition: A | Discharge status: Home / Self Care | Payer: Medicare Other | Source: Ambulatory Visit | Attending: Specialist | Admitting: Specialist

## 2012-07-14 ENCOUNTER — Ambulatory Visit (HOSPITAL_COMMUNITY)
Admission: RE | Admit: 2012-07-14 | Discharge: 2012-07-14 | Disposition: A | Discharge status: Home / Self Care | Payer: Medicare Other | Source: Ambulatory Visit | Attending: Specialist | Admitting: Specialist

## 2012-07-14 DIAGNOSIS — Z01818 Encounter for other preprocedural examination: Secondary | ICD-10-CM | POA: Insufficient documentation

## 2012-07-14 DIAGNOSIS — Z01812 Encounter for preprocedural laboratory examination: Secondary | ICD-10-CM | POA: Insufficient documentation

## 2012-07-14 LAB — URINALYSIS, ROUTINE W REFLEX MICROSCOPIC
Bilirubin Urine: NEGATIVE
Glucose, UA: NEGATIVE mg/dL
Hgb urine dipstick: NEGATIVE
Ketones, ur: NEGATIVE mg/dL
Leukocytes, UA: NEGATIVE
Nitrite: NEGATIVE
Protein, ur: NEGATIVE mg/dL
Specific Gravity, Urine: 1.02 (ref 1.005–1.030)
Urobilinogen, UA: 0.2 mg/dL (ref 0.0–1.0)
pH: 5 (ref 5.0–8.0)

## 2012-07-14 LAB — PROTIME-INR
INR: 0.94 (ref 0.00–1.49)
Prothrombin Time: 12.5 seconds (ref 11.6–15.2)

## 2012-07-14 LAB — SURGICAL PCR SCREEN
MRSA, PCR: NEGATIVE
Staphylococcus aureus: NEGATIVE

## 2012-07-14 LAB — CBC
HCT: 44.5 % (ref 39.0–52.0)
Hemoglobin: 14.4 g/dL (ref 13.0–17.0)
MCH: 26.2 pg (ref 26.0–34.0)
MCHC: 32.4 g/dL (ref 30.0–36.0)
MCV: 80.9 fL (ref 78.0–100.0)
Platelets: 276 10*3/uL (ref 150–400)
RBC: 5.5 MIL/uL (ref 4.22–5.81)
RDW: 16.7 % — ABNORMAL HIGH (ref 11.5–15.5)
WBC: 7.8 10*3/uL (ref 4.0–10.5)

## 2012-07-14 LAB — BASIC METABOLIC PANEL
GFR calc Af Amer: 71 mL/min — ABNORMAL LOW (ref >90)
GFR calc non Af Amer: 61 mL/min — ABNORMAL LOW (ref >90)
Glucose, Bld: 84 mg/dL (ref 70–99)
Potassium: 3.6 mEq/L (ref 3.5–5.1)
Sodium: 145 mEq/L (ref 135–145)

## 2012-07-21 NOTE — Anesthesia Preprocedure Evaluation (Addendum)
Anesthesia Evaluation  Patient identified by MRN, date of birth, ID band Patient awake    Reviewed: Allergy & Precautions, H&P , NPO status , Patient's Chart, lab work & pertinent test results, reviewed documented beta blocker date and time   Airway Mallampati: II TM Distance: >3 FB Neck ROM: full    Dental No notable dental hx.    Pulmonary asthma , sleep apnea and Continuous Positive Airway Pressure Ventilation ,  breath sounds clear to auscultation  Pulmonary exam normal       Cardiovascular Exercise Tolerance: Good hypertension, Rhythm:regular Rate:Normal     Neuro/Psych negative neurological ROS  negative psych ROS   GI/Hepatic Neg liver ROS, hiatal hernia, GERD-  Medicated,  Endo/Other  negative endocrine ROS  Renal/GU Renal disease  negative genitourinary   Musculoskeletal   Abdominal   Peds  Hematology negative hematology ROS (+)   Anesthesia Other Findings   Reproductive/Obstetrics negative OB ROS                           Anesthesia Physical Anesthesia Plan  ASA: II  Anesthesia Plan: General ETT   Post-op Pain Management:    Induction:   Airway Management Planned:   Additional Equipment:   Intra-op Plan:   Post-operative Plan:   Informed Consent: I have reviewed the patients History and Physical, chart, labs and discussed the procedure including the risks, benefits and alternatives for the proposed anesthesia with the patient or authorized representative who has indicated his/her understanding and acceptance.   Dental Advisory Given  Plan Discussed with: CRNA  Anesthesia Plan Comments: (Discussed r/b general versus spinal and femoral nerve block. Scheduled general. Patient consents to general.)       Anesthesia Quick Evaluation

## 2012-07-22 ENCOUNTER — Ambulatory Visit (HOSPITAL_COMMUNITY): Payer: Medicare Other | Admitting: Anesthesiology

## 2012-07-22 ENCOUNTER — Ambulatory Visit (HOSPITAL_COMMUNITY): Payer: Medicare Other

## 2012-07-22 ENCOUNTER — Encounter (HOSPITAL_COMMUNITY)
Admission: RE | Disposition: A | Discharge status: Skilled Nursing Facility | Payer: Self-pay | Source: Ambulatory Visit | Attending: Specialist

## 2012-07-22 ENCOUNTER — Encounter (HOSPITAL_COMMUNITY): Payer: Self-pay | Admitting: Anesthesiology

## 2012-07-22 ENCOUNTER — Encounter (HOSPITAL_COMMUNITY): Payer: Self-pay | Admitting: *Deleted

## 2012-07-22 ENCOUNTER — Inpatient Hospital Stay (HOSPITAL_COMMUNITY)
Admission: RE | Admit: 2012-07-22 | Discharge: 2012-07-25 | DRG: 470 | Disposition: A | Discharge status: Skilled Nursing Facility | Payer: Medicare Other | Source: Ambulatory Visit | Attending: Specialist | Admitting: Specialist

## 2012-07-22 DIAGNOSIS — K449 Diaphragmatic hernia without obstruction or gangrene: Secondary | ICD-10-CM | POA: Diagnosis present

## 2012-07-22 DIAGNOSIS — G4730 Sleep apnea, unspecified: Secondary | ICD-10-CM | POA: Diagnosis present

## 2012-07-22 DIAGNOSIS — Z79899 Other long term (current) drug therapy: Secondary | ICD-10-CM

## 2012-07-22 DIAGNOSIS — Z87442 Personal history of urinary calculi: Secondary | ICD-10-CM

## 2012-07-22 DIAGNOSIS — M1710 Unilateral primary osteoarthritis, unspecified knee: Principal | ICD-10-CM | POA: Diagnosis present

## 2012-07-22 DIAGNOSIS — M1711 Unilateral primary osteoarthritis, right knee: Secondary | ICD-10-CM

## 2012-07-22 DIAGNOSIS — E669 Obesity, unspecified: Secondary | ICD-10-CM | POA: Diagnosis present

## 2012-07-22 DIAGNOSIS — I10 Essential (primary) hypertension: Secondary | ICD-10-CM | POA: Diagnosis present

## 2012-07-22 DIAGNOSIS — J45909 Unspecified asthma, uncomplicated: Secondary | ICD-10-CM | POA: Diagnosis present

## 2012-07-22 DIAGNOSIS — K219 Gastro-esophageal reflux disease without esophagitis: Secondary | ICD-10-CM | POA: Diagnosis present

## 2012-07-22 DIAGNOSIS — Z6831 Body mass index (BMI) 31.0-31.9, adult: Secondary | ICD-10-CM

## 2012-07-22 DIAGNOSIS — H9190 Unspecified hearing loss, unspecified ear: Secondary | ICD-10-CM | POA: Diagnosis present

## 2012-07-22 DIAGNOSIS — M5137 Other intervertebral disc degeneration, lumbosacral region: Secondary | ICD-10-CM | POA: Diagnosis present

## 2012-07-22 DIAGNOSIS — Z888 Allergy status to other drugs, medicaments and biological substances status: Secondary | ICD-10-CM

## 2012-07-22 DIAGNOSIS — Z8719 Personal history of other diseases of the digestive system: Secondary | ICD-10-CM

## 2012-07-22 DIAGNOSIS — N289 Disorder of kidney and ureter, unspecified: Secondary | ICD-10-CM | POA: Diagnosis present

## 2012-07-22 DIAGNOSIS — M51379 Other intervertebral disc degeneration, lumbosacral region without mention of lumbar back pain or lower extremity pain: Secondary | ICD-10-CM | POA: Diagnosis present

## 2012-07-22 DIAGNOSIS — Z7982 Long term (current) use of aspirin: Secondary | ICD-10-CM

## 2012-07-22 HISTORY — PX: TOTAL KNEE ARTHROPLASTY: SHX125

## 2012-07-22 LAB — TYPE AND SCREEN
ABO/RH(D): O POS
Antibody Screen: NEGATIVE

## 2012-07-22 SURGERY — ARTHROPLASTY, KNEE, TOTAL
Anesthesia: General | Site: Knee | Laterality: Right | Wound class: Clean

## 2012-07-22 MED ORDER — METHOCARBAMOL 100 MG/ML IJ SOLN
500.0000 mg | Freq: Four times a day (QID) | INTRAVENOUS | Status: DC | PRN
  Administered 2012-07-22 (×2): 500 mg via INTRAVENOUS
  Filled 2012-07-22 (×2): qty 5

## 2012-07-22 MED ORDER — HYDROMORPHONE HCL PF 1 MG/ML IJ SOLN
0.2500 mg | INTRAMUSCULAR | Status: DC | PRN
  Administered 2012-07-22 (×2): 0.5 mg via INTRAVENOUS

## 2012-07-22 MED ORDER — HYDROCODONE-ACETAMINOPHEN 7.5-325 MG PO TABS
1.0000 | ORAL_TABLET | ORAL | Status: DC | PRN
  Administered 2012-07-22 – 2012-07-25 (×12): 2 via ORAL
  Filled 2012-07-22 (×12): qty 2

## 2012-07-22 MED ORDER — ONDANSETRON HCL 4 MG/2ML IJ SOLN
4.0000 mg | Freq: Four times a day (QID) | INTRAMUSCULAR | Status: DC | PRN
  Administered 2012-07-22 – 2012-07-23 (×2): 4 mg via INTRAVENOUS
  Filled 2012-07-22 (×2): qty 2

## 2012-07-22 MED ORDER — DOCUSATE SODIUM 100 MG PO CAPS
100.0000 mg | ORAL_CAPSULE | Freq: Two times a day (BID) | ORAL | Status: DC
Start: 2012-07-22 — End: 2012-07-25
  Administered 2012-07-22 – 2012-07-25 (×7): 100 mg via ORAL

## 2012-07-22 MED ORDER — PINDOLOL 5 MG PO TABS
5.0000 mg | ORAL_TABLET | Freq: Every day | ORAL | Status: DC
  Administered 2012-07-22 – 2012-07-25 (×4): 5 mg via ORAL
  Filled 2012-07-22 (×4): qty 1

## 2012-07-22 MED ORDER — HYDROMORPHONE HCL PF 1 MG/ML IJ SOLN
0.5000 mg | INTRAMUSCULAR | Status: DC | PRN
  Administered 2012-07-22 – 2012-07-23 (×6): 0.5 mg via INTRAVENOUS
  Filled 2012-07-22 (×6): qty 1

## 2012-07-22 MED ORDER — INDAPAMIDE 2.5 MG PO TABS
2.5000 mg | ORAL_TABLET | Freq: Every day | ORAL | Status: DC
Start: 2012-07-22 — End: 2012-07-25
  Administered 2012-07-22 – 2012-07-25 (×3): 2.5 mg via ORAL
  Filled 2012-07-22 (×4): qty 1

## 2012-07-22 MED ORDER — METOCLOPRAMIDE HCL 10 MG PO TABS: 5.0000 mg | ORAL_TABLET | Freq: Three times a day (TID) | ORAL | Status: DC | PRN

## 2012-07-22 MED ORDER — PHENOL 1.4 % MT LIQD: 1.0000 | OROMUCOSAL | Status: DC | PRN

## 2012-07-22 MED ORDER — METHOCARBAMOL 500 MG PO TABS: 500.0000 mg | ORAL_TABLET | Freq: Four times a day (QID) | ORAL | Status: DC

## 2012-07-22 MED ORDER — METHOCARBAMOL 500 MG PO TABS
500.0000 mg | ORAL_TABLET | Freq: Four times a day (QID) | ORAL | Status: DC | PRN
  Administered 2012-07-22 – 2012-07-24 (×3): 500 mg via ORAL
  Filled 2012-07-22 (×3): qty 1

## 2012-07-22 MED ORDER — LORATADINE 10 MG PO TABS
10.0000 mg | ORAL_TABLET | Freq: Every day | ORAL | Status: DC
  Administered 2012-07-22 – 2012-07-25 (×4): 10 mg via ORAL
  Filled 2012-07-22 (×4): qty 1

## 2012-07-22 MED ORDER — GLYCOPYRROLATE 0.2 MG/ML IJ SOLN
INTRAMUSCULAR | Status: DC | PRN
  Administered 2012-07-22: 0.1 mg via INTRAVENOUS
  Administered 2012-07-22: 0.4 mg via INTRAVENOUS
  Administered 2012-07-22: 0.2 mg via INTRAVENOUS

## 2012-07-22 MED ORDER — HYDROCODONE-ACETAMINOPHEN 5-325 MG PO TABS: 1.0000 | ORAL_TABLET | Freq: Four times a day (QID) | ORAL | Status: DC | PRN

## 2012-07-22 MED ORDER — FENTANYL CITRATE 0.05 MG/ML IJ SOLN
INTRAMUSCULAR | Status: DC | PRN
  Administered 2012-07-22 (×2): 25 ug via INTRAVENOUS
  Administered 2012-07-22 (×2): 50 ug via INTRAVENOUS
  Administered 2012-07-22 (×2): 25 ug via INTRAVENOUS
  Administered 2012-07-22: 50 ug via INTRAVENOUS
  Administered 2012-07-22: 75 ug via INTRAVENOUS
  Administered 2012-07-22: 25 ug via INTRAVENOUS

## 2012-07-22 MED ORDER — SODIUM CHLORIDE 0.45 % IV SOLN
INTRAVENOUS | Status: DC
  Administered 2012-07-22: 13:00:00 via INTRAVENOUS

## 2012-07-22 MED ORDER — CHLORHEXIDINE GLUCONATE 4 % EX LIQD
60.0000 mL | Freq: Once | CUTANEOUS | Status: DC
  Filled 2012-07-22: qty 60

## 2012-07-22 MED ORDER — LACTATED RINGERS IV SOLN
INTRAVENOUS | Status: DC | PRN
  Administered 2012-07-22 (×2): via INTRAVENOUS

## 2012-07-22 MED ORDER — MENTHOL 3 MG MT LOZG: 1.0000 | LOZENGE | OROMUCOSAL | Status: DC | PRN

## 2012-07-22 MED ORDER — HYDROMORPHONE HCL PF 1 MG/ML IJ SOLN
INTRAMUSCULAR | Status: DC | PRN
  Administered 2012-07-22 (×4): 0.5 mg via INTRAVENOUS

## 2012-07-22 MED ORDER — HYDROCORTISONE 1 % EX CREA
1.0000 "application " | TOPICAL_CREAM | Freq: Two times a day (BID) | CUTANEOUS | Status: DC | PRN
  Administered 2012-07-22: 1 via TOPICAL
  Filled 2012-07-22: qty 28

## 2012-07-22 MED ORDER — PROPOFOL 10 MG/ML IV EMUL
INTRAVENOUS | Status: DC | PRN
  Administered 2012-07-22: 20 mg via INTRAVENOUS
  Administered 2012-07-22: 150 mg via INTRAVENOUS

## 2012-07-22 MED ORDER — CEFAZOLIN SODIUM-DEXTROSE 2-3 GM-% IV SOLR
2.0000 g | INTRAVENOUS | Status: AC
  Administered 2012-07-22: 2 g via INTRAVENOUS

## 2012-07-22 MED ORDER — RIVAROXABAN 10 MG PO TABS: 10.0000 mg | ORAL_TABLET | Freq: Every day | ORAL | Status: DC

## 2012-07-22 MED ORDER — TAMSULOSIN HCL 0.4 MG PO CAPS
0.4000 mg | ORAL_CAPSULE | Freq: Every day | ORAL | Status: DC
  Administered 2012-07-22 – 2012-07-25 (×4): 0.4 mg via ORAL
  Filled 2012-07-22 (×4): qty 1

## 2012-07-22 MED ORDER — NEOSTIGMINE METHYLSULFATE 1 MG/ML IJ SOLN
INTRAMUSCULAR | Status: DC | PRN
  Administered 2012-07-22: 3 mg via INTRAVENOUS

## 2012-07-22 MED ORDER — HYDROMORPHONE HCL PF 1 MG/ML IJ SOLN
INTRAMUSCULAR | Status: AC
  Filled 2012-07-22: qty 1

## 2012-07-22 MED ORDER — LACTATED RINGERS IV SOLN: INTRAVENOUS | Status: DC

## 2012-07-22 MED ORDER — ALLOPURINOL 100 MG PO TABS
100.0000 mg | ORAL_TABLET | Freq: Every day | ORAL | Status: DC
  Administered 2012-07-22 – 2012-07-24 (×3): 100 mg via ORAL
  Filled 2012-07-22 (×4): qty 1

## 2012-07-22 MED ORDER — RIVAROXABAN 10 MG PO TABS
10.0000 mg | ORAL_TABLET | Freq: Every day | ORAL | Status: DC
  Administered 2012-07-23 – 2012-07-25 (×3): 10 mg via ORAL
  Filled 2012-07-22 (×4): qty 1

## 2012-07-22 MED ORDER — METOCLOPRAMIDE HCL 5 MG/ML IJ SOLN
5.0000 mg | Freq: Three times a day (TID) | INTRAMUSCULAR | Status: DC | PRN
  Administered 2012-07-23: 10 mg via INTRAVENOUS
  Filled 2012-07-22: qty 2

## 2012-07-22 MED ORDER — PROMETHAZINE HCL 25 MG/ML IJ SOLN: 6.2500 mg | INTRAMUSCULAR | Status: DC | PRN

## 2012-07-22 MED ORDER — SODIUM CHLORIDE 0.9 % IR SOLN
Status: DC | PRN
  Administered 2012-07-22: 3000 mL

## 2012-07-22 MED ORDER — BUPIVACAINE-EPINEPHRINE 0.5% -1:200000 IJ SOLN
INTRAMUSCULAR | Status: DC | PRN
  Administered 2012-07-22: 50 mL

## 2012-07-22 MED ORDER — ONDANSETRON HCL 4 MG/2ML IJ SOLN
INTRAMUSCULAR | Status: DC | PRN
  Administered 2012-07-22 (×2): 2 mg via INTRAVENOUS

## 2012-07-22 MED ORDER — BUPIVACAINE-EPINEPHRINE 0.5% -1:200000 IJ SOLN
INTRAMUSCULAR | Status: AC
  Filled 2012-07-22: qty 1

## 2012-07-22 MED ORDER — LIDOCAINE HCL (CARDIAC) 20 MG/ML IV SOLN
INTRAVENOUS | Status: DC | PRN
  Administered 2012-07-22: 20 mg via INTRAVENOUS

## 2012-07-22 MED ORDER — ONDANSETRON HCL 4 MG PO TABS: 4.0000 mg | ORAL_TABLET | Freq: Four times a day (QID) | ORAL | Status: DC | PRN

## 2012-07-22 MED ORDER — SUCCINYLCHOLINE CHLORIDE 20 MG/ML IJ SOLN
INTRAMUSCULAR | Status: DC | PRN
  Administered 2012-07-22: 140 mg via INTRAVENOUS

## 2012-07-22 MED ORDER — ACETAMINOPHEN 325 MG PO TABS: 650.0000 mg | ORAL_TABLET | Freq: Four times a day (QID) | ORAL | Status: DC | PRN

## 2012-07-22 MED ORDER — POTASSIUM CHLORIDE CRYS ER 20 MEQ PO TBCR
20.0000 meq | EXTENDED_RELEASE_TABLET | Freq: Two times a day (BID) | ORAL | Status: DC
  Administered 2012-07-22 – 2012-07-25 (×7): 20 meq via ORAL
  Filled 2012-07-22 (×8): qty 1

## 2012-07-22 MED ORDER — CISATRACURIUM BESYLATE (PF) 10 MG/5ML IV SOLN
INTRAVENOUS | Status: DC | PRN
  Administered 2012-07-22: 6 mg via INTRAVENOUS

## 2012-07-22 MED ORDER — ACETAMINOPHEN 10 MG/ML IV SOLN
INTRAVENOUS | Status: DC | PRN
  Administered 2012-07-22: 1000 mg via INTRAVENOUS

## 2012-07-22 MED ORDER — SODIUM CHLORIDE 0.9 % IR SOLN
Status: DC | PRN
  Administered 2012-07-22: 08:00:00

## 2012-07-22 MED ORDER — ACETAMINOPHEN 650 MG RE SUPP: 650.0000 mg | Freq: Four times a day (QID) | RECTAL | Status: DC | PRN

## 2012-07-22 SURGICAL SUPPLY — 65 items
BAG SPEC THK2 15X12 ZIP CLS (MISCELLANEOUS) ×1
BAG ZIPLOCK 12X15 (MISCELLANEOUS) ×2 IMPLANT
BANDAGE ELASTIC 4 VELCRO ST LF (GAUZE/BANDAGES/DRESSINGS) ×2 IMPLANT
BANDAGE ELASTIC 6 VELCRO ST LF (GAUZE/BANDAGES/DRESSINGS) ×2 IMPLANT
BANDAGE ESMARK 6X9 LF (GAUZE/BANDAGES/DRESSINGS) ×1 IMPLANT
BLADE SAG 18X100X1.27 (BLADE) ×2 IMPLANT
BLADE SAW SGTL 13.0X1.19X90.0M (BLADE) ×2 IMPLANT
BNDG CMPR 9X6 STRL LF SNTH (GAUZE/BANDAGES/DRESSINGS) ×1
BNDG ESMARK 6X9 LF (GAUZE/BANDAGES/DRESSINGS) ×2
CEMENT HV SMART SET (Cement) ×4 IMPLANT
CHLORAPREP W/TINT 26ML (MISCELLANEOUS) IMPLANT
CLOTH BEACON ORANGE TIMEOUT ST (SAFETY) ×2 IMPLANT
CUFF TOURN SGL QUICK 34 (TOURNIQUET CUFF) ×2
CUFF TRNQT CYL 34X4X40X1 (TOURNIQUET CUFF) ×1 IMPLANT
DECANTER SPIKE VIAL GLASS SM (MISCELLANEOUS) ×2 IMPLANT
DRAPE LG THREE QUARTER DISP (DRAPES) ×2 IMPLANT
DRAPE ORTHO SPLIT 77X108 STRL (DRAPES) ×4
DRAPE POUCH INSTRU U-SHP 10X18 (DRAPES) ×2 IMPLANT
DRAPE SURG ORHT 6 SPLT 77X108 (DRAPES) ×2 IMPLANT
DRAPE U-SHAPE 47X51 STRL (DRAPES) ×2 IMPLANT
DRSG ADAPTIC 3X8 NADH LF (GAUZE/BANDAGES/DRESSINGS) ×2 IMPLANT
DRSG EMULSION OIL 3X16 NADH (GAUZE/BANDAGES/DRESSINGS) ×1 IMPLANT
DRSG PAD ABDOMINAL 8X10 ST (GAUZE/BANDAGES/DRESSINGS) ×2 IMPLANT
DURAPREP 26ML APPLICATOR (WOUND CARE) ×2 IMPLANT
ELECT REM PT RETURN 9FT ADLT (ELECTROSURGICAL) ×2
ELECTRODE REM PT RTRN 9FT ADLT (ELECTROSURGICAL) ×1 IMPLANT
EVACUATOR 1/8 PVC DRAIN (DRAIN) ×2 IMPLANT
FACESHIELD LNG OPTICON STERILE (SAFETY) ×10 IMPLANT
GLOVE BIOGEL PI IND STRL 7.5 (GLOVE) ×1 IMPLANT
GLOVE BIOGEL PI IND STRL 8 (GLOVE) ×1 IMPLANT
GLOVE BIOGEL PI INDICATOR 7.5 (GLOVE) ×1
GLOVE BIOGEL PI INDICATOR 8 (GLOVE) ×1
GLOVE SURG SS PI 7.5 STRL IVOR (GLOVE) ×2 IMPLANT
GLOVE SURG SS PI 8.0 STRL IVOR (GLOVE) ×4 IMPLANT
GOWN PREVENTION PLUS LG XLONG (DISPOSABLE) ×2 IMPLANT
GOWN PREVENTION PLUS XLARGE (GOWN DISPOSABLE) ×2 IMPLANT
GOWN STRL REIN XL XLG (GOWN DISPOSABLE) ×4 IMPLANT
HANDPIECE INTERPULSE COAX TIP (DISPOSABLE) ×2
IMMOBILIZER KNEE 20 (SOFTGOODS) ×2
IMMOBILIZER KNEE 20 THIGH 36 (SOFTGOODS) ×1 IMPLANT
KIT BASIN OR (CUSTOM PROCEDURE TRAY) ×2 IMPLANT
MANIFOLD NEPTUNE II (INSTRUMENTS) ×2 IMPLANT
NDL SAFETY ECLIPSE 18X1.5 (NEEDLE) IMPLANT
NEEDLE HYPO 18GX1.5 SHARP (NEEDLE) ×2
NS IRRIG 1000ML POUR BTL (IV SOLUTION) ×2 IMPLANT
PACK TOTAL JOINT (CUSTOM PROCEDURE TRAY) ×2 IMPLANT
PADDING CAST COTTON 6X4 STRL (CAST SUPPLIES) ×2 IMPLANT
POSITIONER SURGICAL ARM (MISCELLANEOUS) ×2 IMPLANT
SET HNDPC FAN SPRY TIP SCT (DISPOSABLE) ×1 IMPLANT
SPONGE GAUZE 4X4 12PLY (GAUZE/BANDAGES/DRESSINGS) ×1 IMPLANT
SPONGE SURGIFOAM ABS GEL 100 (HEMOSTASIS) ×2 IMPLANT
STAPLER VISISTAT (STAPLE) ×2 IMPLANT
SUCTION FRAZIER 12FR DISP (SUCTIONS) ×2 IMPLANT
SUT BONE WAX W31G (SUTURE) ×2 IMPLANT
SUT VIC AB 1 CT1 27 (SUTURE) ×8
SUT VIC AB 1 CT1 27XBRD ANTBC (SUTURE) ×4 IMPLANT
SUT VIC AB 2-0 CT1 27 (SUTURE) ×6
SUT VIC AB 2-0 CT1 TAPERPNT 27 (SUTURE) ×3 IMPLANT
SUT VLOC 180 0 24IN GS25 (SUTURE) ×2 IMPLANT
SYR 30ML LL (SYRINGE) ×2 IMPLANT
TOWEL OR 17X26 10 PK STRL BLUE (TOWEL DISPOSABLE) ×2 IMPLANT
TOWER CARTRIDGE SMART MIX (DISPOSABLE) ×2 IMPLANT
TRAY FOLEY CATH 14FRSI W/METER (CATHETERS) ×2 IMPLANT
WATER STERILE IRR 1500ML POUR (IV SOLUTION) ×2 IMPLANT
WRAP KNEE MAXI GEL POST OP (GAUZE/BANDAGES/DRESSINGS) ×2 IMPLANT

## 2012-07-22 NOTE — Brief Op Note (Signed)
07/22/2012  9:18 AM  PATIENT:  Darrell Perez  72 y.o. male  PRE-OPERATIVE DIAGNOSIS:  RIGHT KNEE DJD  POST-OPERATIVE DIAGNOSIS:  RIGHT KNEE DJD  PROCEDURE:  Procedure(s) (LRB) with comments: TOTAL KNEE ARTHROPLASTY (Right)  SURGEON:  Surgeon(s) and Role:    * Javier Docker, MD - Primary  PHYSICIAN ASSISTANT:   ASSISTANTS: Bissell   ANESTHESIA:   general  EBL:  Total I/O In: -  Out: 375 [Urine:300; Blood:75]  BLOOD ADMINISTERED:none  DRAINS: hemovac    LOCAL MEDICATIONS USED:  MARCAINE     SPECIMEN:  No Specimen  DISPOSITION OF SPECIMEN:  N/A  COUNTS:  YES  TOURNIQUET:  * Missing tourniquet times found for documented tourniquets in log:  [id number] *  DICTATION: .Other Dictation: Dictation Number [id number]  PLAN OF CARE: Admit to inpatient   PATIENT DISPOSITION:  PACU - hemodynamically stable.   Delay start of Pharmacological VTE agent (>24hrs) due to surgical blood loss or risk of bleeding: no

## 2012-07-22 NOTE — Anesthesia Postprocedure Evaluation (Signed)
  Anesthesia Post-op Note  Patient: Darrell Perez  Procedure(s) Performed: Procedure(s) (LRB): TOTAL KNEE ARTHROPLASTY (Right)  Patient Location: PACU  Anesthesia Type: General  Level of Consciousness: awake and alert   Airway and Oxygen Therapy: Patient Spontanous Breathing  Post-op Pain: mild  Post-op Assessment: Post-op Vital signs reviewed, Patient's Cardiovascular Status Stable, Respiratory Function Stable, Patent Airway and No signs of Nausea or vomiting  Last Vitals:  Filed Vitals:   07/22/12 1015  BP:   Pulse: 58  Temp: 36.8 C  Resp: 10    Post-op Vital Signs: stable   Complications: No apparent anesthesia complications. Bulge left flank noted postoperatively, but after discussion with patient, he states this bulge is normal for him.

## 2012-07-22 NOTE — Transfer of Care (Signed)
Immediate Anesthesia Transfer of Care Note  Patient: Darrell Perez  Procedure(s) Performed: Procedure(s) (LRB) with comments: TOTAL KNEE ARTHROPLASTY (Right)  Patient Location: PACU  Anesthesia Type:General  Level of Consciousness: sedated  Airway & Oxygen Therapy: Patient Spontanous Breathing and Patient connected to face mask  Post-op Assessment: Report given to PACU RN and Post -op Vital signs reviewed and stable  Post vital signs: Reviewed and stable  Complications: No apparent anesthesia complications

## 2012-07-22 NOTE — Interval H&P Note (Signed)
History and Physical Interval Note:  07/22/2012 7:08 AM  Darrell Perez  has presented today for surgery, with the diagnosis of RIGHT KNEE DJD  The various methods of treatment have been discussed with the patient and family. After consideration of risks, benefits and other options for treatment, the patient has consented to  Procedure(s) (LRB) with comments: TOTAL KNEE ARTHROPLASTY (Right) as a surgical intervention .  The patient's history has been reviewed, patient examined, no change in status, stable for surgery.  I have reviewed the patient's chart and labs.  Questions were answered to the patient's satisfaction.     Cerise Lieber C

## 2012-07-22 NOTE — Evaluation (Signed)
Physical Therapy Evaluation Patient Details Name: Darrell Perez MRN: [id number] DOB: 1941-03-11 Today's Date: 07/22/2012 Time: 4098-1191 PT Time Calculation (min): 39 min  PT Assessment / Plan / Recommendation Clinical Impression  Pt s/p R TKR presents with decreased R LE strength/ROM  and post op pain and nausea limiting functional mobility    PT Assessment  Patient needs continued PT services    Follow Up Recommendations  SNF    Does the patient have the potential to tolerate intense rehabilitation      Barriers to Discharge Decreased caregiver support      Equipment Recommendations  None recommended by PT    Recommendations for Other Services OT consult   Frequency 7X/week    Precautions / Restrictions Precautions Precautions: Knee Required Braces or Orthoses: Knee Immobilizer - Right Knee Immobilizer - Right: Discontinue once straight leg raise with < 10 degree lag Restrictions Weight Bearing Restrictions: No Other Position/Activity Restrictions: WBAT   Pertinent Vitals/Pain      Mobility  Bed Mobility Bed Mobility: Supine to Sit;Sit to Supine Supine to Sit: 3: Mod assist Sit to Supine: 3: Mod assist Details for Bed Mobility Assistance: cues for sequence with assist for R LE and to bring trunk to upright position Transfers Transfers: Sit to Stand;Stand to Sit Sit to Stand: 3: Mod assist Stand to Sit: 3: Mod assist Details for Transfer Assistance: cues for use of UEs and for LE management Ambulation/Gait Ambulation/Gait Assistance: 3: Mod assist Ambulation Distance (Feet): 4 Feet Assistive device: Rolling walker Ambulation/Gait Assistance Details: cues for sequence - pt sidestepped up bed Gait Pattern: Step-to pattern;Decreased stance time - right    Shoulder Instructions     Exercises Total Joint Exercises Ankle Circles/Pumps: AROM;10 reps;Supine;Both Quad Sets: AROM;Both;10 reps;Supine Heel Slides: AAROM;Right;10 reps;Supine Straight Leg Raises:  AAROM;Right;10 reps;Supine   PT Diagnosis: Difficulty walking  PT Problem List: Decreased strength;Decreased range of motion;Decreased activity tolerance;Decreased mobility;Pain;Obesity;Decreased knowledge of use of DME PT Treatment Interventions: DME instruction;Gait training;Stair training;Functional mobility training;Therapeutic activities;Therapeutic exercise;Patient/family education   PT Goals Acute Rehab PT Goals PT Goal Formulation: With patient Time For Goal Achievement: 07/27/12 Potential to Achieve Goals: Good Pt will go Supine/Side to Sit: with supervision PT Goal: Supine/Side to Sit - Progress: Goal set today Pt will go Sit to Supine/Side: with supervision PT Goal: Sit to Supine/Side - Progress: Goal set today Pt will go Sit to Stand: with supervision PT Goal: Sit to Stand - Progress: Goal set today Pt will go Stand to Sit: with supervision PT Goal: Stand to Sit - Progress: Goal set today Pt will Ambulate: >150 feet;with supervision;with least restrictive assistive device PT Goal: Ambulate - Progress: Goal set today  Visit Information  Last PT Received On: 07/22/12 Assistance Needed: +1    Subjective Data  Subjective: i've been putting this off for 3 years Patient Stated Goal: Rehab and home with wife   Prior Functioning  Home Living Lives With: Spouse Home Adaptive Equipment: Walker - rolling;Bedside commode/3-in-1 Prior Function Level of Independence: Independent Able to Take Stairs?: Yes Driving: Yes Vocation: Retired Musician: No difficulties    Cognition  Overall Cognitive Status: Appears within functional limits for tasks assessed/performed Arousal/Alertness: Awake/alert Orientation Level: Appears intact for tasks assessed Behavior During Session: [facility] for tasks performed    Extremity/Trunk Assessment Right Upper Extremity Assessment RUE ROM/Strength/Tone: Deficits RUE ROM/Strength/Tone Deficits: ROM WFL but pt favors this limb,  not utilizing to max 2* shoulder replacement x 2 Left Upper Extremity Assessment LUE ROM/Strength/Tone: [facility]  for tasks assessed Right Lower Extremity Assessment RLE ROM/Strength/Tone: Deficits RLE ROM/Strength/Tone Deficits: quads 2+/5 with AAROM -15 - 45 Left Lower Extremity Assessment LLE ROM/Strength/Tone: WFL for tasks assessed   Balance    End of Session PT - End of Session Equipment Utilized During Treatment: Gait belt;Right knee immobilizer Activity Tolerance: Patient limited by fatigue;Other (comment) (and nausea) Patient left: in bed;with call bell/phone within reach;with family/visitor present Nurse Communication: Mobility status;Other (comment) (pt requests nausea meds) CPM Right Knee CPM Right Knee: Off  GP     Darrell Perez 07/22/2012, 4:46 PM

## 2012-07-22 NOTE — H&P (View-Only) (Signed)
Danella Sensing DOB: 1940/11/21 Married / Language: English / Race: White Male  H&P date: 07/13/12  Chief Complaint: right knee pain  History of Present Illness The patient is a 72 year old male who comes in today for a preoperative History and Physical. The patient is scheduled for a right total knee arthroplasty to be performed by Dr. Javier Docker, MD at [facility] on 07/22/2012. They are now 4 years out from when symptoms began. He reports bilateral knee pain, right worse than left, worse with activities, better with rest, recently aggravated. Symptoms reported today include: pain and giving way (occasional). Pt reports their pain level to be moderate. Current treatment includes: NSAIDs (Advil prn) and pain medications (Hydrocodone or Oxycodone prn, most recently Hydrocodone). The patient has reported improvement of their symptoms with: Cortisone injections intermittently, last injection in September 2013. He has worked on Freight forwarder. He has worn a knee brace as needed for support.  Dr. Shelle Iron and the patient have mutually agreed to proceed with a right total knee replacement. Risks and benefits of the procedure were discussed including stiffness, suboptimal range of motion, persistent pain, infection requiring removal of prosthesis and reinsertion, need for prophylactic antibiotics in the future, for example, dental procedures, possible need for manipulation, revision in the future and also anesthetic complications including DVT, PE, etc. We discussed the perioperative course, time in the hospital, postoperative recovery and the need for elevation to control swelling. We also discussed the predicted range of motion and the probability that squatting and kneeling would be unobtainable in the future. In addition, postoperative anticoagulation was discussed. We have obtained preoperative medical clearance as necessary (Dr. Theressa Millard, PCP). Provided illustrated  handout and discussed it in detail. They will enroll in the total joint replacement educational forum at the hospital.  PAT appt at [facility] scheduled for tomorrow, 07/14/12.   Past MedicalHx Degeneration, lumbar/lumbosacral disc  Plantar fasciitis Osteoarthrosis, shoulder  Trigger finger s/p release Tear, medial meniscus, knee  High blood pressure Kidney Stone Asthma Gastroesophageal Reflux Disease Sleep Apnea. uses CPAP Impaired Hearing Vertigo. intermittent and transient Cataract. s/p surgery Hiatal Hernia Hemorrhoids Osteoarthritis B/L knees Measles Mumps   Allergies Naprosyn *ANALGESICS - ANTI-INFLAMMATORY*. jittery, nervous   Family History Heart Disease. mother Hypertension. mother Cerebrovascular Accident. mother   Social History Tobacco use. Never smoker. never smoker Living situation. lives with spouse. one story home with 4 steps to enter home. has walker at home. Current work status. retired Airline pilot Exercise. Exercises monthly; does running / walking Alcohol use. current drinker; drinks beer and wine; only occasionally per week Tobacco / smoke exposure. no Children. 2 Marital status. married Merchant navy officer. Living Will   Medication History Meperidine HCl (50MG  Tablet, Oral) Active. Pantoprazole Sodium (40MG  Tablet DR, Oral) Active. Pindolol (5MG  Tablet, Oral) Active. Indapamide (2.5MG  Tablet, Oral) Active. Allopurinol (100MG  Tablet, Oral) Active. Aspirin EC (81MG  Tablet DR, Oral) Active. Potassium ( Oral) Specific dose unknown - Active. Tamsulosin HCl (0.4MG  Capsule, Oral) Active. Vitamin D3 (1000UNIT Capsule, Oral) Active. Amoxicillin as needed for dental ppx s/p reverse TSA   Past Surgical History Carpal Tunnel Repair. bilateral- 1989, 1991 Arthroscopy of Shoulder. right Cataract Surgery. bilateral- 2005, 2012 Lithotripsy. kidney stones 1972, 1977 Hand Surgery. trigger finger release 2006 Shoulder  Surgery. right shoulder hemiarthroplasty 2007, conversion R shoulder hemi to reverse shoulder arthroplasty 2013 Neck Surgery. goiter, 1967   Review of Systems General:Not Present- Chills, Fever, Night Sweats, Fatigue, Weight Gain, Weight Loss and Memory Loss. Skin:Present- Itching.  Not Present- Hives, Rash, Eczema and Lesions. HEENT:Present- Hearing Loss. Not Present- Tinnitus, Headache, Double Vision, Visual Loss and Dentures. Respiratory:Not Present- Shortness of breath with exertion, Shortness of breath at rest, Allergies, Coughing up blood and Chronic Cough. Note:last CXR 10/09/11 Cardiovascular:Not Present- Chest Pain, Racing/skipping heartbeats, Difficulty Breathing Lying Down, Murmur, Swelling and Palpitations. Note:last EKG 10/09/11 Gastrointestinal:Present- Diarrhea. Not Present- Bloody Stool, Heartburn, Abdominal Pain, Vomiting, Nausea, Constipation, Difficulty Swallowing, Jaundice and Loss of appetitie. Male Genitourinary:Present- Urinating at Night. Not Present- Urinary frequency, Blood in Urine, Weak urinary stream, Discharge, Flank Pain, Incontinence, Painful Urination, Urgency and Urinary Retention. Musculoskeletal:Present- Joint Pain and Back Pain (intermittent, with increased activity, denies current back pain). Not Present- Muscle Weakness, Muscle Pain, Joint Swelling, Morning Stiffness and Spasms. Neurological:Present- Dizziness (intermittent and transient, denies any current dizziness). Not Present- Tremor, Blackout spells, Paralysis, Difficulty with balance and Weakness. Psychiatric:Not Present- Insomnia.   Vitals 07/13/2012 9:23 AM Weight: 214 lb Height: 68 in Body Surface Area: 2.16 m Body Mass Index: 32.54 kg/m Pulse: 57 (Regular) BP: 146/61 (Sitting, Left Arm, Standard)  Physical Exam The physical exam findings are as follows:  General Mental Status - Alert, cooperative and good historian. General Appearance- pleasant. Not in acute  distress. Orientation- Oriented X3. Build & Nutrition- Well nourished and Well developed. Gait- Limping.  Head and Neck Head- normocephalic, atraumatic . Neck Global Assessment- supple. no bruit auscultated on the right and no bruit auscultated on the left  Eye Pupil- Bilateral- Regular and Round. Motion- Bilateral- EOMI.  Chest and Lung Exam Auscultation: Breath sounds:- clear at anterior chest wall and - clear at posterior chest wall. Adventitious sounds:- No Adventitious sounds.  Cardiovascular Auscultation:Rhythm- Regular rate and rhythm. Heart Sounds- S1 WNL and S2 WNL. Murmurs & Other Heart Sounds:Auscultation of the heart reveals - No Murmurs.  Abdomen Palpation/Percussion:Tenderness- Abdomen is non-tender to palpation. Rigidity (guarding)- Abdomen is soft. Auscultation:Auscultation of the abdomen reveals - Bowel sounds normal.  Male Genitourinary Note: Not done, not pertinent to present illness  Musculoskeletal Note: On exam he is tender along the medial joint line on the right knee as well as the left. Patellofemoral pain with compression. Trace effusion is noted bilaterally. On the right range is -5 to 90 and on the left -3 to 120. Knee exam on inspection reveals no evidence of soft tissue swelling, ecchymosis, deformity, or erythema. On palpation there is no tenderness in the lateral joint line. Nontender over the fibular head of the peroneal nerve. Nontender over the quadriceps insertion of the patellar ligament insertion. Provocative maneuvers revealed a negative Lachman, negative anterior and posterior drawer, and a negative McMurray's. No instability was noted with varus and valgus stressing at 0 or 30 degrees. On manual motor test the quadriceps and hamstrings were 5/5. Sensory exam was intact to light touch.  Assessment & Plan Osteoarthrosis right knee  Note: X-rays (January 2013) demonstrate endstage osteoarthrosis medial compartment  bilaterally, left greater than right.  Pt is scheduled for right total knee arthroplasty by Dr. Shelle Iron 07/22/12. He has been cleared medically by his PCP Dr. Theressa Millard. We discussed R TKA procedure itself as well as risks, complications, and alternatives including but not limited to DVT, PE, infx, bleeding, failure of procedure, need for secondary procedure, nerve injury, anesthesia risk, even death. Pt desires to proceed with surgery as scheduled next week. All questions answered. He will continue with Norco as needed for pain pre-op (refilled today). Plan to D/C from hospital with Percocet as needed for pain as well  as Robaxin prn. Pt will be placed on Xarelto post-op for DVT ppx. Hold ASA and NSAIDs at this point. Remain NPO after MN night before surgery. Plan for D/C home with home health vs. SNF/rehab placement, pt and wife are open to both options depending on pain level and PT progress while inpt. Discussed post-op protocol. Pt will schedule follow up for 10-14 days post-op for suture removal.   Signed electronically by Dorothy Spark PA-C

## 2012-07-23 LAB — BASIC METABOLIC PANEL
Chloride: 95 mEq/L — ABNORMAL LOW (ref 96–112)
GFR calc Af Amer: >90 mL/min (ref >90)
GFR calc non Af Amer: 82 mL/min — ABNORMAL LOW (ref >90)
Potassium: 2.8 mEq/L — ABNORMAL LOW (ref 3.5–5.1)
Sodium: 131 mEq/L — ABNORMAL LOW (ref 135–145)

## 2012-07-23 LAB — ABO/RH: ABO/RH(D): O POS

## 2012-07-23 LAB — CBC
MCHC: 32.3 g/dL (ref 30.0–36.0)
RDW: 16.6 % — ABNORMAL HIGH (ref 11.5–15.5)
WBC: 12.1 10*3/uL — ABNORMAL HIGH (ref 4.0–10.5)

## 2012-07-23 NOTE — Evaluation (Signed)
Occupational Therapy Evaluation Patient Details Name: Darrell Perez MRN: [id number] DOB: 02-05-1941 Today's Date: 07/23/2012 Time: 0911- 918; 932-942 and 1107 - 1113 21 minutes    OT Assessment / Plan / Recommendation Clinical Impression  This 72 year old man was admitted for R TKA.  He will benefit from continued OT to increase independence and safety with ADLs.  Focus in acute will be on safety with transfer to 3:1 and increased activity tolerance    OT Assessment  Patient needs continued OT Services    Follow Up Recommendations  SNF    Barriers to Discharge      Equipment Recommendations  None recommended by OT    Recommendations for Other Services    Frequency  Min 2X/week    Precautions / Restrictions Precautions Precautions: Knee Required Braces or Orthoses: Knee Immobilizer - Right Knee Immobilizer - Right: Discontinue once straight leg raise with < 10 degree lag Restrictions Other Position/Activity Restrictions: WBAT   Pertinent Vitals/Pain No pain, but nausea present during first 2 sessions    ADL  Grooming: Set up Where Assessed - Grooming: Supported sitting Upper Body Bathing: Set up Where Assessed - Upper Body Bathing: Supported sitting Lower Body Bathing: Moderate assistance Where Assessed - Lower Body Bathing: Supported sit to stand Upper Body Dressing: Minimal assistance (iv) Where Assessed - Upper Body Dressing: Supported sitting Lower Body Dressing: Moderate assistance Where Assessed - Lower Body Dressing: Supported sit to Pharmacist, hospital: Minimal assistance Toilet Transfer Method: Stand pivot (cues for hand placement and extending RLE) Toileting - Clothing Manipulation and Hygiene: Minimal assistance Where Assessed - Toileting Clothing Manipulation and Hygiene: Standing Transfers/Ambulation Related to ADLs: Pt limited by nausea:  seen in 3 short sessions to complete eval ADL Comments: Educated on AE for adls but did not practice with this  today    OT Diagnosis: Generalized weakness  OT Problem List: Decreased strength;Decreased activity tolerance;Decreased knowledge of use of DME or AE (nausea) OT Treatment Interventions: Self-care/ADL training;DME and/or AE instruction;Patient/family education   OT Goals Acute Rehab OT Goals OT Goal Formulation: With patient Time For Goal Achievement: 07/30/12 Potential to Achieve Goals: Good ADL Goals Pt Will Perform Grooming: with supervision;Standing at sink;Supported ADL Goal: Grooming - Progress: Goal set today Pt Will Transfer to Toilet: with min assist;Ambulation;3-in-1 (min guard) ADL Goal: Statistician - Progress: Goal set today Pt Will Perform Toileting - Hygiene: with supervision;Standing at 3-in-1/toilet ADL Goal: Toileting - Hygiene - Progress: Goal set today  Visit Information  Last OT Received On: 07/23/12 Assistance Needed: +1    Subjective Data  Subjective: I have to go to the bathroom.  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   Prior Functioning     Home Living Lives With: Spouse Home Adaptive Equipment: Walker - rolling;Bedside commode/3-in-1 Additional Comments: plans [facility] Prior Function Level of Independence: Independent Able to Take Stairs?: Yes Driving: Yes Communication Communication: No difficulties Dominant Hand: Right         Vision/Perception     Cognition  Overall Cognitive Status: Appears within functional limits for tasks assessed/performed Arousal/Alertness: Awake/alert Orientation Level: Appears intact for tasks assessed Behavior During Session: [facility] for tasks performed    Extremity/Trunk Assessment Right Upper Extremity Assessment RUE ROM/Strength/Tone: Deficits RUE ROM/Strength/Tone Deficits: ROM WFL but pt favors this limb, not utilizing to max 2* shoulder replacement x 2 Left Upper Extremity Assessment LUE ROM/Strength/Tone: [facility] for tasks assessed  Mobility  Transfers Sit to Stand: 4: Min assist;With armrests;From chair/3-in-1 Stand to Sit: 4: Min assist Details for Transfer Assistance: cues for hand placement and extending RLE     Shoulder Instructions     Exercise     Balance     End of Session OT - End of Session Activity Tolerance:  (limited by nausea) Patient left: in chair;with call bell/phone within reach;with family/visitor present  GO     Darrell Perez 07/23/2012, 11:14 AM Marica Otter, OTR/L [phone] 07/23/2012

## 2012-07-23 NOTE — Progress Notes (Signed)
RT set patient up on CPAP with home tubing and nasal pillows. 2L of oxygen bled in and set to 12 cm H2O per home settings. Sterile water added to fill line. Spouse at bedside, patient states he is comfortable. Rt encouraged both to call if they needed any assistance.

## 2012-07-23 NOTE — Progress Notes (Signed)
Clinical Social Work Department CLINICAL SOCIAL WORK PLACEMENT NOTE 07/23/2012  Patient:  Darrell Perez, Darrell Perez  Account Number:  [id number] Admit date:  07/22/2012  Clinical Social Worker:  Cori Razor, LCSW  Date/time:  07/23/2012 02:36 PM  Clinical Social Work is seeking post-discharge placement for this patient at the following level of care:   SKILLED NURSING   (*CSW will update this form in Epic as items are completed)     Patient/family provided with [facility] Department of Clinical Social Work's list of facilities offering this level of care within the geographic area requested by the patient (or if unable, by the patient's family).  07/23/2012  Patient/family informed of their freedom to choose among providers that offer the needed level of care, that participate in Medicare, Medicaid or managed care program needed by the patient, have an available bed and are willing to accept the patient.    Patient/family informed of [facility]' ownership interest in [facility], as well as of the fact that they are under no obligation to receive care at this facility.  PASARR submitted to EDS on 07/22/2012 PASARR number received from EDS on 07/22/2012  FL2 transmitted to all facilities in geographic area requested by pt/family on  07/22/2012 FL2 transmitted to all facilities within larger geographic area on   Patient informed that his/her managed care company has contracts with or will negotiate with  certain facilities, including the following:     Patient/family informed of bed offers received:  07/22/2012 Patient chooses bed at [facility] Physician recommends and patient chooses bed at    Patient to be transferred to [facility] on   Patient to be transferred to facility by   The following physician request were entered in Epic:   Additional Comments:  Cori Razor LCSW [phone]

## 2012-07-23 NOTE — Progress Notes (Signed)
CSW assisting with d/c planning. Pt has a ST SNF bed at [facility] this weekend if pt is ready for d/c. [facility] has been contacted and prior approval has been requested for SNF placement and Ambulance transport. Authorization will be needed prior to d/c to SNF. D/C planning has been discussed with pt/family. CSW will continue to follow pt to assist with d/c planning needs.   Cori Razor LCSW [phone]

## 2012-07-23 NOTE — Progress Notes (Signed)
Physical Therapy Treatment Patient Details Name: Darrell Perez MRN: [id number] DOB: May 27, 1941 Today's Date: 07/23/2012 Time: 4010-2725 PT Time Calculation (min): 32 min  PT Assessment / Plan / Recommendation Comments on Treatment Session  POD # 1 R TKR am session.  Instructed pt and spouse on KI use for amb.  Assisted pt OOB with increased time due to mild c/o dizzyness. Attempted amb however pt only able to progress 5 feet due to MAX c/o nausea and overall not feeling well.  Spouse assisted by following with chair. Reported to RN.    Follow Up Recommendations  SNF     Does the patient have the potential to tolerate intense rehabilitation     Barriers to Discharge        Equipment Recommendations  None recommended by PT    Recommendations for Other Services    Frequency 7X/week   Plan Discharge plan remains appropriate    Precautions / Restrictions Precautions Precautions: Knee Precaution Comments: Instructed pt and spouse on KI use for amb Required Braces or Orthoses: Knee Immobilizer - Right Knee Immobilizer - Right: Discontinue once straight leg raise with < 10 degree lag Restrictions Weight Bearing Restrictions: No Other Position/Activity Restrictions: WBAT   Pertinent Vitals/Pain C/o "some" R knee pain but MAX nausea (reported to RN)    Mobility  Bed Mobility Bed Mobility: Supine to Sit;Sitting - Scoot to Edge of Bed Supine to Sit: 4: Min assist;3: Mod assist Sitting - Scoot to Edge of Bed: 4: Min assist;3: Mod assist Details for Bed Mobility Assistance: cues for sequence with assist for R LE and to bring trunk to upright position  Transfers Transfers: Sit to Stand;Stand to Sit Sit to Stand: 4: Min assist;From bed;From elevated surface;3: Mod assist Stand to Sit: 4: Min assist;3: Mod assist;To chair/3-in-1 Details for Transfer Assistance: 50% VC's on proper tech and hand placement. Ambulation/Gait Ambulation/Gait Assistance: 3: Mod assist Ambulation Distance  (Feet): 5 Feet Assistive device: Rolling walker Ambulation/Gait Assistance Details: spouse assisted by following with chair.  Pt c/o max nausea and was unable to amb much. Unsteady, shaky gait requiring 75% VC's on proper sequencing and walker placement. Gait Pattern: Step-to pattern;Decreased stance time - right;Decreased step length - right;Decreased step length - left;Trunk flexed Gait velocity: decreased     PT Goals                                    Progressing slowly    Visit Information  Last PT Received On: 07/23/12 Assistance Needed: +1    Subjective Data  Subjective: I don't feel good Patient Stated Goal: none stated   Cognition       Balance     End of Session PT - End of Session Equipment Utilized During Treatment: Gait belt Activity Tolerance: Other (comment) (limited by nausea/overall not feeling well)   Felecia Shelling  PTA [facility]  Acute  Rehab Pager      [phone]

## 2012-07-23 NOTE — Discharge Summary (Addendum)
Physician Discharge Summary   Patient ID: Darrell Perez MRN: [id number] DOB/AGE: 72-Feb-1942 72 y.o.  Admit date: 07/22/2012 Discharge date: planned for 07/25/12  Primary Diagnosis: right knee DJD    Admission Diagnoses:  Past Medical History  Diagnosis Date  . Hypertension   . Asthma     15-20 yrs ago  . Kidney stones     chronic   . GERD (gastroesophageal reflux disease)   . H/O hiatal hernia 20 yrs ago     no problems now   . Arthritis   . Sleep apnea     2-3 yrs ago uses CPAP machine    Discharge Diagnoses:   Principal Problem:  *Right knee DJD  Estimated Body mass index is 31.01 kg/(m^2) as calculated from the following:   Height as of this encounter: 5\' 9" (1.753 m).   Weight as of this encounter: 210 lb(95.255 kg).  Classification of overweight in adults according to BMI (WHO, 1998)   Procedure:  Procedure(s) (LRB): TOTAL KNEE ARTHROPLASTY (Right)   Consults: None  HPI: see H&P Laboratory Data: Admission on 07/22/2012  Component Date Value Range Status  . ABO/RH(D) 07/22/2012 O POS   Final  . Antibody Screen 07/22/2012 NEG   Final  . Sample Expiration 07/22/2012 07/25/2012   Final  . ABO/RH(D) 07/22/2012 O POS   Final  . WBC 07/23/2012 12.1* 4.0 - 10.5 K/uL Final  . RBC 07/23/2012 4.24  4.22 - 5.81 MIL/uL Final  . Hemoglobin 07/23/2012 11.0* 13.0 - 17.0 g/dL Final  . HCT 56/21/3086 34.1* 39.0 - 52.0 % Final  . MCV 07/23/2012 80.4  78.0 - 100.0 fL Final  . MCH 07/23/2012 25.9* 26.0 - 34.0 pg Final  . MCHC 07/23/2012 32.3  30.0 - 36.0 g/dL Final  . RDW 57/84/6962 16.6* 11.5 - 15.5 % Final  . Platelets 07/23/2012 221  150 - 400 K/uL Final  . Sodium 07/23/2012 131* 135 - 145 mEq/L Final  . Potassium 07/23/2012 2.8* 3.5 - 5.1 mEq/L Final  . Chloride 07/23/2012 95* 96 - 112 mEq/L Final  . CO2 07/23/2012 29  19 - 32 mEq/L Final  . Glucose, Bld 07/23/2012 117* 70 - 99 mg/dL Final  . BUN 95/28/4132 11  6 - 23 mg/dL Final  . Creatinine, Ser 07/23/2012 0.94   0.50 - 1.35 mg/dL Final  . Calcium 44/06/270 7.7* 8.4 - 10.5 mg/dL Final  . GFR calc non Af Amer 07/23/2012 82* >90 mL/min Final  . GFR calc Af Amer 07/23/2012 >90  >90 mL/min Final   Comment:                                 The eGFR has been calculated                          using the CKD EPI equation.                          This calculation has not been                          validated in all clinical                          situations.  eGFR's persistently                          <90 mL/min signify                          possible Chronic Kidney Disease.  Hospital Outpatient Visit on 07/14/2012  Component Date Value Range Status  . aPTT 07/14/2012 38* 24 - 37 seconds Final   Comment:                                 IF BASELINE aPTT IS ELEVATED,                          SUGGEST PATIENT RISK ASSESSMENT                          BE USED TO DETERMINE APPROPRIATE                          ANTICOAGULANT THERAPY.  . MRSA, PCR 07/14/2012 NEGATIVE  NEGATIVE Final  . Staphylococcus aureus 07/14/2012 NEGATIVE  NEGATIVE Final   Comment:                                 The Xpert SA Assay (FDA                          approved for NASAL specimens                          in patients over 72 years of age),                          is one component of                          a comprehensive surveillance                          program.  Test performance has                          been validated by [employer] for patients greater                          than or equal to 72 year old.                          It is not intended                          to diagnose infection nor to                          guide or monitor treatment.  . WBC 07/14/2012 7.8  4.0 - 10.5  K/uL Final  . RBC 07/14/2012 5.50  4.22 - 5.81 MIL/uL Final  . Hemoglobin 07/14/2012 14.4  13.0 - 17.0 g/dL Final  . HCT 40/98/1191 44.5  39.0 - 52.0 % Final  . MCV  07/14/2012 80.9  78.0 - 100.0 fL Final  . MCH 07/14/2012 26.2  26.0 - 34.0 pg Final  . MCHC 07/14/2012 32.4  30.0 - 36.0 g/dL Final  . RDW 47/82/9562 16.7* 11.5 - 15.5 % Final  . Platelets 07/14/2012 276  150 - 400 K/uL Final  . Sodium 07/14/2012 145  135 - 145 mEq/L Final  . Potassium 07/14/2012 3.6  3.5 - 5.1 mEq/L Final  . Chloride 07/14/2012 102  96 - 112 mEq/L Final  . CO2 07/14/2012 32  19 - 32 mEq/L Final  . Glucose, Bld 07/14/2012 84  70 - 99 mg/dL Final  . BUN 13/01/6577 22  6 - 23 mg/dL Final  . Creatinine, Ser 07/14/2012 1.17  0.50 - 1.35 mg/dL Final  . Calcium 46/96/2952 9.6  8.4 - 10.5 mg/dL Final  . GFR calc non Af Amer 07/14/2012 61* >90 mL/min Final  . GFR calc Af Amer 07/14/2012 71* >90 mL/min Final   Comment:                                 The eGFR has been calculated                          using the CKD EPI equation.                          This calculation has not been                          validated in all clinical                          situations.                          eGFR's persistently                          <90 mL/min signify                          possible Chronic Kidney Disease.  [name] Prothrombin Time 07/14/2012 12.5  11.6 - 15.2 seconds Final  . INR 07/14/2012 0.94  0.00 - 1.49 Final  . Color, Urine 07/14/2012 YELLOW  YELLOW Final  . APPearance 07/14/2012 CLEAR  CLEAR Final  . Specific Gravity, Urine 07/14/2012 1.020  1.005 - 1.030 Final  . pH 07/14/2012 5.0  5.0 - 8.0 Final  . Glucose, UA 07/14/2012 NEGATIVE  NEGATIVE mg/dL Final  . Hgb urine dipstick 07/14/2012 NEGATIVE  NEGATIVE Final  . Bilirubin Urine 07/14/2012 NEGATIVE  NEGATIVE Final  . Ketones, ur 07/14/2012 NEGATIVE  NEGATIVE mg/dL Final  . Protein, ur 84/13/2440 NEGATIVE  NEGATIVE mg/dL Final  . Urobilinogen, UA 07/14/2012 0.2  0.0 - 1.0 mg/dL Final  . Nitrite 04/26/2535 NEGATIVE  NEGATIVE Final  . Leukocytes, UA 07/14/2012 NEGATIVE  NEGATIVE Final   MICROSCOPIC NOT DONE ON  URINES WITH NEGATIVE PROTEIN, BLOOD, LEUKOCYTES, NITRITE, OR GLUCOSE <1000 mg/dL.  X-Rays:Dg Knee 2 Views Right  07/14/2012  *RADIOLOGY REPORT*  Clinical Data: Preop for right knee replacement  RIGHT KNEE - 1-2 VIEW  Comparison: None.  Findings: Two views of the right knee submitted.  There is narrowing of medial joint compartment.  Mild spurring of medial and lateral tibial plateau and femoral condyles.  Mild narrowing of patellofemoral joint space.  No joint effusion. No acute fracture or subluxation.  IMPRESSION: No acute fracture or subluxation.  Degenerative changes as described above.   Original Report Authenticated By: Natasha Mead, M.D.    Dg Knee Right Port  07/22/2012  *RADIOLOGY REPORT*  Clinical Data: Status post total knee arthroplasty.  PORTABLE RIGHT KNEE - 1-2 VIEW  Comparison: 07/14/2012.  Findings: AP and lateral views of the right knee demonstrate postoperative changes of total knee arthroplasty.  The femoral and tibial components of the prosthesis appear to be well seated, without definite periprosthetic fracture or other immediate complicating features.  Alignment is anatomic.  A surgical drain is in place within the joint, and there are surgical staples anterior to the joint.  A small amount of gas in the joint and overlying soft tissues is noted.  IMPRESSION: 1.  Expected postoperative appearance of the right knee status post total knee arthroplasty, without acute complicating features.   Original Report Authenticated By: Trudie Reed, M.D.     EKG: Orders placed during the hospital encounter of 10/09/11  . EKG     Hospital Course: Darrell Perez is a 72 y.o. who was admitted to [facility]. They were brought to the operating room on 07/22/2012 and underwent Procedure(s): TOTAL KNEE ARTHROPLASTY.  Patient tolerated the procedure well and was later transferred to the recovery room and then to the orthopaedic floor for postoperative care.  They were given PO and IV  analgesics for pain control following their surgery.  They were given 24 hours of postoperative antibiotics of  Anti-infectives     Start     Dose/Rate Route Frequency Ordered Stop   07/22/12 0805   polymyxin B 500,000 Units, bacitracin 50,000 Units in sodium chloride irrigation 0.9 % 500 mL irrigation  Status:  Discontinued          As needed 07/22/12 0805 07/22/12 0938   07/22/12 0546   ceFAZolin (ANCEF) IVPB 2 g/50 mL premix        2 g 100 mL/hr over 30 Minutes Intravenous 60 min pre-op 07/22/12 0546 07/22/12 0715         and started on DVT prophylaxis in the form of Xarelto.   PT and OT were ordered for total joint protocol.  Discharge planning consulted to help with postop disposition and equipment needs.  Patient had a good night on the evening of surgery, pain well controlled, and started to get up OOB with therapy on day one. Hemovac drain was pulled without difficulty.  Continued to work with therapy. Plan to change dressing day 2, continue PT. Plan for D/C day 2-3 depending on progress with PT. Plan to D/C to [facility].   Discharge Medications: Prior to Admission medications   Medication Sig Start Date End Date Taking? Authorizing Provider  allopurinol (ZYLOPRIM) 100 MG tablet Take 100 mg by mouth daily at 12 noon.    Yes Historical Provider, MD  aspirin EC 81 MG tablet Take 81 mg by mouth daily.   Yes Historical Provider, MD  cetirizine (ZYRTEC) 10 MG tablet Take 10 mg by mouth daily. [facility] version of Zyrtec  Yes Historical Provider, MD  cholecalciferol (VITAMIN D) 1000 UNITS tablet Take 1,000 Units by mouth daily.   Yes Historical Provider, MD  hydrocortisone cream 1 % Apply 1 application topically 2 (two) times daily as needed. For itching   Yes Historical Provider, MD  indapamide (LOZOL) 2.5 MG tablet Take 2.5 mg by mouth daily.   Yes Historical Provider, MD  pindolol (VISKEN) 5 MG tablet Take 5 mg by mouth daily.   Yes Historical Provider, MD  potassium chloride SA  (K-DUR,KLOR-CON) 20 MEQ tablet Take 20 mEq by mouth 2 (two) times daily.   Yes Historical Provider, MD  Tamsulosin HCl (FLOMAX) 0.4 MG CAPS Take 0.4 mg by mouth daily.   Yes Historical Provider, MD  amoxicillin (AMOXIL) 500 MG capsule Take 2,000 mg by mouth daily as needed. For dental procedure    Historical Provider, MD  HYDROcodone-acetaminophen (NORCO) 5-325 MG per tablet Take 1-2 tablets by mouth every 6 (six) hours as needed for pain. 07/22/12   Dayna Barker. Magenta Schmiesing, PA-C  HYDROcodone-acetaminophen (NORCO/VICODIN) 5-325 MG per tablet Take 1 tablet by mouth every 6 (six) hours as needed.    Historical Provider, MD  methocarbamol (ROBAXIN) 500 MG tablet Take 1 tablet (500 mg total) by mouth 4 (four) times daily. 07/22/12   Dayna Barker. Quantavious Eggert, PA-C  rivaroxaban (XARELTO) 10 MG TABS tablet Take 1 tablet (10 mg total) by mouth daily. 07/22/12   Dayna Barker. Christene Lye, PA-C    Diet: Regular diet Activity:WBAT Follow-up:in 10-14 days Disposition - Skilled nursing facility Discharged Condition: good      Medication List     As of 07/23/2012 11:20 AM    TAKE these medications         HYDROcodone-acetaminophen 5-325 MG per tablet   Commonly known as: NORCO/VICODIN   Take 1-2 tablets by mouth every 6 (six) hours as needed for pain.      methocarbamol 500 MG tablet   Commonly known as: ROBAXIN   Take 1 tablet (500 mg total) by mouth 4 (four) times daily.      rivaroxaban 10 MG Tabs tablet   Commonly known as: XARELTO   Take 1 tablet (10 mg total) by mouth daily.      ASK your doctor about these medications         allopurinol 100 MG tablet   Commonly known as: ZYLOPRIM   Take 100 mg by mouth daily at 12 noon.      amoxicillin 500 MG capsule   Commonly known as: AMOXIL   Take 2,000 mg by mouth daily as needed. For dental procedure      aspirin EC 81 MG tablet   Take 81 mg by mouth daily.      cetirizine 10 MG tablet   Commonly known as: ZYRTEC   Take 10 mg by mouth daily. [facility] version  of Zyrtec      cholecalciferol 1000 UNITS tablet   Commonly known as: VITAMIN D   Take 1,000 Units by mouth daily.      HYDROcodone-acetaminophen 5-325 MG per tablet   Commonly known as: NORCO/VICODIN   Take 1 tablet by mouth every 6 (six) hours as needed.      hydrocortisone cream 1 %   Apply 1 application topically 2 (two) times daily as needed. For itching      indapamide 2.5 MG tablet   Commonly known as: LOZOL   Take 2.5 mg by mouth daily.      pindolol 5 MG tablet  Commonly known as: VISKEN   Take 5 mg by mouth daily.      potassium chloride SA 20 MEQ tablet   Commonly known as: K-DUR,KLOR-CON   Take 20 mEq by mouth 2 (two) times daily.      Tamsulosin HCl 0.4 MG Caps   Commonly known as: FLOMAX   Take 0.4 mg by mouth daily.           Follow-up Information    Follow up with Javier Docker, MD.   Contact information:   [address] [place] [address] [place] [address] [phone]           Signed: Dorothy Spark. 07/23/2012, 11:20 AM    Pt continued to tolerate PT well throughout his stay. Pain well controlled. D/C'd 07/25/12 as planned to [facility]. No change to above summary.  Andrez Grime PA-C

## 2012-07-23 NOTE — Op Note (Signed)
NAME:  Darrell Perez, Darrell Perez NO.:  [id number]  MEDICAL RECORD NO.:  [id number]  LOCATION:  1616                         FACILITY:  [facility]  PHYSICIAN:  Jene Every, M.D.    DATE OF BIRTH:  [birth year]  DATE OF PROCEDURE:  07/22/2012 DATE OF DISCHARGE:                              OPERATIVE REPORT   PREOPERATIVE DIAGNOSES:  Degenerative joint disease end-stage, osteoarthrosis of the right knee.  POSTOPERATIVE DIAGNOSES:  Degenerative joint disease end-stage, osteoarthrosis of the right knee.  PROCEDURE PERFORMED:  Right total knee arthroplasty.  ANESTHESIA:  General.  ASSISTANT:  Lanna Poche, PA.  COMPONENTS UTILIZED:  DePuy rotating platform, 4 femur, 4 tibia, 12.5 insert, 38 patella.  HISTORY:  A 72 year old, refractory end-stage bone-on-bone arthritis of medial compartment, refractory cortisone therapy injections, indicated for replacement.  Risks and benefits discussed including bleeding, infection, damage to vascular structures, DVT, PE, anesthetic complications, suboptimal range of motion, etc.  TECHNIQUE:  With the patient in supine position, after induction of adequate general anesthesia, 2 g Kefzol, the right lower extremity was prepped and draped and exsanguinated in usual sterile fashion.  Thigh tourniquet inflated to 300.  Midline incision was made, knee flexed, full-thickness flaps developed.  Median and parapatellar arthrotomy was performed.  Patella everted, knee flexed, elevated soft tissues medially preserving the MCL.  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  Step drill utilized in the femoral canal, 5 degree right was placed intramedullary, 11 off the distal femur.  Oscillating saw performed the distal femoral cut.  Then we subluxed tibia, removed the remnants of medial and lateral menisci. Cauterized the geniculates, removed the ACL, osteoarthrosis noted, bone on bone.   External alignment guide bisecting the ankle, 10 off the high side which was laterally pinned in the distal and medial aspect of the tibial tubercle.  Oscillating saw performed the tibial cut.  We had prior to that sized the distal femur 4 from the anterior cortex and then the distal femoral cutting jig in 3 degrees of external rotation, pinned, and performed anterior and posterior chamfer cuts.  This was after noting that the flexion extension gaps were equivalent.  Next, we completed the tibia, tibia subluxed, McCullough placed, soft tissues protected posteriorly all times for all the cuts and distal femoral cut with a curved Crego posteriorly.  We sized the tibia to a 4 just to medial aspect of the tibial tubercle, pinned it, centrally drilled it, used a punch fin guide.  Then turned our attention towards completing the distal femur.  We had maximal coverage of the tibia.  Box cut was then performed and after placement of the jig, cuts performed.  Femoral trial placed as well as the tibia 12.5 insert, full extension, full flexion, good stability, varus valgus stressing, 0-30 degrees. Attention turned towards the patella, measured 26 cut to a 16, 38 size. We drilled our PEG holes and medialized them.  Trial patella was placed and we had good patellofemoral tracking.  All trials were removed, we checked posteriorly.  Popliteus was intact.  No osteophytes.  Copiously irrigated with pulsatile lavage, low pressure against the soft tissue, knee flexed, all surface dried, mixed the  cement on the back table in appropriate fashion, injected under pressure in the proximal tibia, digitally pressurized it, impacted the 4 tray, impacted the 4 cemented femur, 12.5 insert placed, held in extension, axial load applied to the curing of the cement, redundant cement removed.  We cemented patellar button as well.  After appropriate curing of the cement, redundant cement removed with an osteotome, removed  the trial and then copiously irrigated with antibiotic irrigation, placed a permanent 12.5 insert, full extension after reduction, full flexion.  Negative anterior drawer. Good stability of varus valgus stressing 0-30 degrees and good patellofemoral tracking.  Following this, placed bone wax in the cancellous surfaces.  Hemovac brought through a lateral stab wound in the skin.  Knee was slightly flexed, reapproximated patellar tendon with 1 Vicryl in a running V-Loc, subcu with 2-0, skin staples, had flexion to gravity 90 degrees after skin closure and good stability and good patellofemoral tracking.  Next, wound was dressed sterilely.  Again the tibial cut was performed with the external alignment guide bisecting the ankle parallel to the tibial shaft and pinned off the high side.  Next sterile dressing was applied.  Tourniquet was deflated with adequate revascularization of lower extremity appreciated.  The patient tolerated the procedure well.  No complications.  Tourniquet time 90 minutes.  No blood loss.     Jene Every, M.D.     Cordelia Pen  D:  07/22/2012  T:  07/23/2012  Job:  [id number]

## 2012-07-23 NOTE — Progress Notes (Signed)
Physical Therapy Treatment Patient Details Name: Darrell Perez MRN: [id number] DOB: 09-25-1940 Today's Date: 07/23/2012 Time: 2956-2130 PT Time Calculation (min): 32 min  PT Assessment / Plan / Recommendation Comments on Treatment Session  POD # 1 R TKR am session.  Instructed pt and spouse on KI use for amb.  Assisted pt OOB with increased time due to mild c/o dizzyness. Attempted amb however pt only able to progress 5 feet due to MAX c/o nausea and overall not feeling well.  Spouse assisted by following with chair. Reported to RN.    Follow Up Recommendations  SNF     Does the patient have the potential to tolerate intense rehabilitation     Barriers to Discharge        Equipment Recommendations  None recommended by PT    Recommendations for Other Services    Frequency 7X/week   Plan Discharge plan remains appropriate    Precautions / Restrictions Precautions Precautions: Knee Precaution Comments: Instructed pt and spouse on KI use for amb Required Braces or Orthoses: Knee Immobilizer - Right Knee Immobilizer - Right: Discontinue once straight leg raise with < 10 degree lag Restrictions Weight Bearing Restrictions: No Other Position/Activity Restrictions: WBAT   Pertinent Vitals/Pain C/o "some" R knee pain but MAX nausea    Mobility  Bed Mobility Bed Mobility: Supine to Sit;Sitting - Scoot to Edge of Bed Supine to Sit: 4: Min assist;3: Mod assist Sitting - Scoot to Edge of Bed: 4: Min assist;3: Mod assist Details for Bed Mobility Assistance: cues for sequence with assist for R LE and to bring trunk to upright position  Transfers Transfers: Sit to Stand;Stand to Sit Sit to Stand: 4: Min assist;From bed;From elevated surface;3: Mod assist Stand to Sit: 4: Min assist;3: Mod assist;To chair/3-in-1 Details for Transfer Assistance: 50% VC's on proper tech and hand placement. Ambulation/Gait Ambulation/Gait Assistance: 3: Mod assist Ambulation Distance (Feet): 5  Feet Assistive device: Rolling walker Ambulation/Gait Assistance Details: spouse assisted by following with chair.  Pt c/o max nausea and was unable to amb much. Unsteady, shaky gait requiring 75% VC's on proper sequencing and walker placement. Gait Pattern: Step-to pattern;Decreased stance time - right;Decreased step length - right;Decreased step length - left;Trunk flexed Gait velocity: decreased    PT Goals                                 Progressing slowly    Visit Information  Last PT Received On: 07/23/12 Assistance Needed: +1    Subjective Data  Subjective: I don't feel good Patient Stated Goal: none stated   Cognition       Balance     End of Session PT - End of Session Equipment Utilized During Treatment: Gait belt Activity Tolerance: Other (comment) (limited by nausea/overall not feeling well)   Felecia Shelling  PTA [facility]  Acute  Rehab Pager      [phone]

## 2012-07-23 NOTE — Progress Notes (Signed)
Set patient up on CPAP with home tubing and nasal pillows. Set at 12 cm H2O per home settings and 2L of oxygen bled in. Sterile water added to fill line. Spouse at bedside, patient states he is comfortable and RT encouraged them to call should they need further assistance.

## 2012-07-23 NOTE — Progress Notes (Signed)
Patient ID: Darrell Perez, male   DOB: 1941/01/30, 72 y.o.   MRN: [id number] Subjective: 1 Day Post-Op Procedure(s) (LRB): TOTAL KNEE ARTHROPLASTY (Right) Patient reports pain as moderate.    Patient has complaints of incisional right knee pain.   We will start therapy today. Plan is to go home with home health vs. [facility] after hospital stay.  Objective: Vital signs in last 24 hours: Temp:  [97.3 F (36.3 C)-98.8 F (37.1 C)] 98.4 F (36.9 C) (01/24 0500) Pulse Rate:  [56-75] 62  (01/24 0500) Resp:  [13-18] 18  (01/24 0500) BP: (134-172)/(68-86) 134/75 mmHg (01/24 0500) SpO2:  [97 %-100 %] 97 % (01/24 0500) FiO2 (%):  [100 %] 100 % (01/23 1050) Weight:  [95.255 kg (210 lb)] 95.255 kg (210 lb) (01/23 1050)  Intake/Output from previous day:  Intake/Output Summary (Last 24 hours) at 07/23/12 0730 Last data filed at 07/23/12 0600  Gross per 24 hour  Intake   2320 ml  Output   2612 ml  Net   -292 ml    Intake/Output this shift:    Labs: Results for orders placed during the hospital encounter of 07/22/12  TYPE AND SCREEN      Component Value Range   ABO/RH(D) O POS     Antibody Screen NEG     Sample Expiration 07/25/2012    ABO/RH      Component Value Range   ABO/RH(D) O POS    CBC      Component Value Range   WBC 12.1 (*) 4.0 - 10.5 K/uL   RBC 4.24  4.22 - 5.81 MIL/uL   Hemoglobin 11.0 (*) 13.0 - 17.0 g/dL   HCT 40.9 (*) 81.1 - 91.4 %   MCV 80.4  78.0 - 100.0 fL   MCH 25.9 (*) 26.0 - 34.0 pg   MCHC 32.3  30.0 - 36.0 g/dL   RDW 78.2 (*) 95.6 - 21.3 %   Platelets 221  150 - 400 K/uL  BASIC METABOLIC PANEL      Component Value Range   Sodium 131 (*) 135 - 145 mEq/L   Potassium 2.8 (*) 3.5 - 5.1 mEq/L   Chloride 95 (*) 96 - 112 mEq/L   CO2 29  19 - 32 mEq/L   Glucose, Bld 117 (*) 70 - 99 mg/dL   BUN 11  6 - 23 mg/dL   Creatinine, Ser 0.86  0.50 - 1.35 mg/dL   Calcium 7.7 (*) 8.4 - 10.5 mg/dL   GFR calc non Af Amer 82 (*) >90 mL/min   GFR calc Af Amer >90   >90 mL/min    Exam - Neurologically intact Neurovascular intact Sensation intact distally Intact pulses distally Dorsiflexion/Plantar flexion intact Incision: dressing C/D/I No cellulitis present Compartment soft Dressing - clean, dry, no drainage Motor function intact - moving foot and toes well on exam.  No calf pain or evidence of DVT Hemovac pulled without difficulty.  Assessment/Plan: 1 Day Post-Op Procedure(s) (LRB): TOTAL KNEE ARTHROPLASTY (Right)  Advance diet Up with therapy Past Medical History  Diagnosis Date  . Hypertension   . Asthma     15-20 yrs ago  . Kidney stones     chronic   . GERD (gastroesophageal reflux disease)   . H/O hiatal hernia 20 yrs ago     no problems now   . Arthritis   . Sleep apnea     2-3 yrs ago uses CPAP machine     DVT Prophylaxis - Xarelto  Protocol Weight-Bearing as tolerated to right leg Keep foley until tomorrow. No vaccines. Will discuss with Dr. Elissa Lovett, Dayna Barker. 07/23/2012, 7:30 AM

## 2012-07-23 NOTE — Progress Notes (Signed)
Clinical Social Work Department BRIEF PSYCHOSOCIAL ASSESSMENT 07/23/2012  Patient:  EQUAN, COGBILL     Account Number:  [id number]     Admit date:  07/22/2012  Clinical Social Worker:  Candie Chroman  Date/Time:  07/23/2012 02:25 PM  Referred by:  Physician  Date Referred:  07/23/2012 Referred for  SNF Placement   Other Referral:   Interview type:  Patient Other interview type:    PSYCHOSOCIAL DATA Living Status:  WIFE Admitted from facility:   Level of care:   Primary support name:  betty Montez Morita Primary support relationship to patient:  SPOUSE Degree of support available:   supportive    CURRENT CONCERNS Current Concerns  Post-Acute Placement   Other Concerns:    SOCIAL WORK ASSESSMENT / PLAN Pt is a 72 yr old gentleman living at home prior to hospitalization. CSW met with pt / spouse to assist with d/c planning. Pt has made prior arrangements to have ST rehab at [facility] following hospital d/c. CSW has confirmed d/c plan with SNF. Pt has [employer]. CSW has received insurance auth for SNF placement and amb transport for possible d/c this week end.   Assessment/plan status:  Psychosocial Support/Ongoing Assessment of Needs Other assessment/ plan:   Information/referral to community resources:   Insurance coverage reviewed.    PATIENT'S/FAMILY'S RESPONSE TO PLAN OF CARE: Pt is looking forward to rehab at [facility]   Cori Razor LCSW [phone]

## 2012-07-24 LAB — BASIC METABOLIC PANEL
Chloride: 96 mEq/L (ref 96–112)
GFR calc Af Amer: >90 mL/min (ref >90)
GFR calc non Af Amer: 81 mL/min — ABNORMAL LOW (ref >90)
Potassium: 3.2 mEq/L — ABNORMAL LOW (ref 3.5–5.1)
Sodium: 135 mEq/L (ref 135–145)

## 2012-07-24 LAB — CBC
MCHC: 32.3 g/dL (ref 30.0–36.0)
RDW: 16.7 % — ABNORMAL HIGH (ref 11.5–15.5)
WBC: 13 10*3/uL — ABNORMAL HIGH (ref 4.0–10.5)

## 2012-07-24 MED ORDER — BISACODYL 5 MG PO TBEC
5.0000 mg | DELAYED_RELEASE_TABLET | Freq: Every day | ORAL | Status: DC | PRN
  Administered 2012-07-24: 5 mg via ORAL
  Filled 2012-07-24: qty 1

## 2012-07-24 MED ORDER — FLEET ENEMA 7-19 GM/118ML RE ENEM: 1.0000 | ENEMA | Freq: Every day | RECTAL | Status: DC | PRN

## 2012-07-24 MED ORDER — BISACODYL 10 MG RE SUPP: 10.0000 mg | Freq: Every day | RECTAL | Status: DC | PRN

## 2012-07-24 NOTE — Progress Notes (Signed)
Subjective: 2 Days Post-Op Procedure(s) (LRB): TOTAL KNEE ARTHROPLASTY (Right) Patient reports pain as 3 on 0-10 scale.    Objective: Vital signs in last 24 hours: Temp:  [97.9 F (36.6 C)-99.1 F (37.3 C)] 99.1 F (37.3 C) (01/25 0616) Pulse Rate:  [57-64] 62  (01/25 0616) Resp:  [16-18] 16  (01/25 0616) BP: (130-152)/(66-73) 146/68 mmHg (01/25 0616) SpO2:  [93 %-99 %] 98 % (01/25 0616)  Intake/Output from previous day: 01/24 0701 - 01/25 0700 In: 1456 [P.O.:900; I.V.:550; IV Piggyback:6] Out: 2650 [Urine:2650] Intake/Output this shift: Total I/O In: 420 [P.O.:420] Out: 1250 [Urine:1250]   Basename 07/24/12 0446 07/23/12 0433  HGB 11.9* 11.0*    Basename 07/24/12 0446 07/23/12 0433  WBC 13.0* 12.1*  RBC 4.54 4.24  HCT 36.8* 34.1*  PLT 237 221    Basename 07/23/12 0433  NA 131*  K 2.8*  CL 95*  CO2 29  BUN 11  CREATININE 0.94  GLUCOSE 117*  CALCIUM 7.7*   No results found for this basename: LABPT:2,INR:2 in the last 72 hours  Neurologically intact Neurovascular intact Dorsiflexion/Plantar flexion intact Incision: I: C/D/I  Assessment/Plan: 2 Days Post-Op Procedure(s) (LRB): TOTAL KNEE ARTHROPLASTY (Right) Advance diet Up with therapy D/C IV fluids Discharge home with home health Check potassium  Darrell Perez C 07/24/2012, 6:42 AM

## 2012-07-24 NOTE — Progress Notes (Signed)
Physical Therapy Treatment Patient Details Name: Darrell Perez MRN: [id number] DOB: Oct 31, 1940 Today's Date: 07/24/2012 Time: 0800-0840 PT Time Calculation (min): 40 min  PT Assessment / Plan / Recommendation Comments on Treatment Session  Pt. tolerated ambulation in hall, feels much better. Pt. plans to go to rehab tomorrow.    Follow Up Recommendations  SNF     Does the patient have the potential to tolerate intense rehabilitation     Barriers to Discharge        Equipment Recommendations  None recommended by PT    Recommendations for Other Services    Frequency 7X/week   Plan Discharge plan remains appropriate;Frequency remains appropriate    Precautions / Restrictions Precautions Precautions: Knee Required Braces or Orthoses: Knee Immobilizer - Right Knee Immobilizer - Right: Discontinue once straight leg raise with < 10 degree lag   Pertinent Vitals/Pain No c/o    Mobility  Transfers Sit to Stand: 3: Mod assist;With upper extremity assist;From chair/3-in-1 Stand to Sit: To chair/3-in-1;With upper extremity assist;4: Min assist Details for Transfer Assistance: cues to use UE to push up and reach back. Ambulation/Gait Ambulation/Gait Assistance: 3: Mod assist Ambulation Distance (Feet): 20 Feet Assistive device: Rolling walker Ambulation/Gait Assistance Details: state/demo 3/3 posterior precautions.cues for sequence and posture, step length. Pt. much improved. Gait Pattern: Step-to pattern;Antalgic;Decreased stance time - right Gait velocity: decr    Exercises     PT Diagnosis:    PT Problem List:   PT Treatment Interventions:     PT Goals Acute Rehab PT Goals Pt will go Sit to Stand: with supervision PT Goal: Sit to Stand - Progress: Progressing toward goal Pt will go Stand to Sit: with supervision PT Goal: Stand to Sit - Progress: Progressing toward goal Pt will Ambulate: >150 feet;with supervision;with least restrictive assistive device PT Goal:  Ambulate - Progress: Progressing toward goal  Visit Information  Last PT Received On: 07/24/12 Assistance Needed: +2    Subjective Data  Subjective: I want to walk . I feel better.   Cognition  Overall Cognitive Status: Appears within functional limits for tasks assessed/performed Arousal/Alertness: Awake/alert Orientation Level: Appears intact for tasks assessed Behavior During Session: [facility] for tasks performed    Balance     End of Session PT - End of Session Equipment Utilized During Treatment: Gait belt Activity Tolerance: Patient tolerated treatment well Patient left: in chair;with call bell/phone within reach;with family/visitor present Nurse Communication: Mobility status   GP     Rada Hay 07/24/2012, 3:40 PM

## 2012-07-24 NOTE — Progress Notes (Signed)
Physical Therapy Treatment Patient Details Name: Darrell Perez MRN: [id number] DOB: 11-May-1941 Today's Date: 07/24/2012 Time: 4098-1191 PT Time Calculation (min): 33 min  PT Assessment / Plan / Recommendation Comments on Treatment Session  Pt improved in ambulating  this PM    Follow Up Recommendations  SNF     Does the patient have the potential to tolerate intense rehabilitation     Barriers to Discharge        Equipment Recommendations  None recommended by PT    Recommendations for Other Services    Frequency 7X/week   Plan Discharge plan remains appropriate;Frequency remains appropriate    Precautions / Restrictions Precautions Precautions: Knee Required Braces or Orthoses: Knee Immobilizer - Right Knee Immobilizer - Right: Discontinue once straight leg raise with < 10 degree lag   Pertinent Vitals/Pain No c/o    Mobility  Bed Mobility Sit to Supine: 4: Min assist Details for Bed Mobility Assistance: support R leg onto bed. Transfers Sit to Stand: From chair/3-in-1;4: Min assist Stand to Sit: To bed;4: Min guard Details for Transfer Assistance: cues for RLE position to sit down, Ambulation/Gait Ambulation/Gait Assistance: 4: Min assist (chair brought behind by wife.) Ambulation Distance (Feet): 30 Feet (x2) Assistive device: Rolling walker Ambulation/Gait Assistance Details: cues for sequence and posture, step length. Pt. much improved Gait Pattern: Step-to pattern;Decreased step length - right;Decreased stance time - right;Antalgic Gait velocity: decr    Exercises Total Joint Exercises Quad Sets: AROM;Right;10 reps;Supine Short Arc Quad: AAROM;Right;10 reps;Supine Heel Slides: AAROM;Right;10 reps;Supine Straight Leg Raises: AAROM;Right;10 reps;Supine   PT Diagnosis:    PT Problem List:   PT Treatment Interventions:     PT Goals Acute Rehab PT Goals Pt will go Sit to Supine/Side: with supervision PT Goal: Sit to Supine/Side - Progress: Progressing  toward goal Pt will go Sit to Stand: with supervision PT Goal: Sit to Stand - Progress: Progressing toward goal Pt will go Stand to Sit: with supervision PT Goal: Stand to Sit - Progress: Progressing toward goal Pt will Ambulate: >150 feet;with supervision;with least restrictive assistive device PT Goal: Ambulate - Progress: Progressing toward goal  Visit Information  Last PT Received On: 07/24/12 Assistance Needed: +1 (wife followed w/ chair.)    Subjective Data  Subjective: I feel better.   Cognition  Overall Cognitive Status: Appears within functional limits for tasks assessed/performed Arousal/Alertness: Awake/alert Orientation Level: Appears intact for tasks assessed Behavior During Session: [facility] for tasks performed    Balance     End of Session PT - End of Session Equipment Utilized During Treatment: Right knee immobilizer Activity Tolerance: Patient tolerated treatment well Patient left: in bed;with call bell/phone within reach;with family/visitor present Nurse Communication: Mobility status   GP     Rada Hay 07/24/2012, 3:46 PM

## 2012-07-24 NOTE — Progress Notes (Signed)
Subjective: 2 Days Post-Op Procedure(s) (LRB): TOTAL KNEE ARTHROPLASTY (Right) Patient reports pain as  3.    Objective: Vital signs in last 24 hours: Temp:  [97.9 F (36.6 C)-99.1 F (37.3 C)] 99.1 F (37.3 C) (01/25 0616) Pulse Rate:  [57-64] 62  (01/25 0616) Resp:  [16-18] 16  (01/25 0616) BP: (130-152)/(66-73) 146/68 mmHg (01/25 0616) SpO2:  [93 %-99 %] 98 % (01/25 0616)  Intake/Output from previous day: 01/24 0701 - 01/25 0700 In: 1456 [P.O.:900; I.V.:550; IV Piggyback:6] Out: 2650 [Urine:2650] Intake/Output this shift: Total I/O In: 420 [P.O.:420] Out: 1250 [Urine:1250]   Basename 07/24/12 0446 07/23/12 0433  HGB 11.9* 11.0*    Basename 07/24/12 0446 07/23/12 0433  WBC 13.0* 12.1*  RBC 4.54 4.24  HCT 36.8* 34.1*  PLT 237 221    Basename 07/23/12 0433  NA 131*  K 2.8*  CL 95*  CO2 29  BUN 11  CREATININE 0.94  GLUCOSE 117*  CALCIUM 7.7*   No results found for this basename: LABPT:2,INR:2 in the last 72 hours  NVI. No DVT I: C/D/I  Assessment/Plan: 2 Days Post-Op Procedure(s) (LRB): TOTAL KNEE ARTHROPLASTY (Right) PT OOB D/C tomorrow Check K+  Marcques Wrightsman C 07/24/2012, 6:38 AM

## 2012-07-25 LAB — CBC
HCT: 36.3 % — ABNORMAL LOW (ref 39.0–52.0)
Hemoglobin: 12 g/dL — ABNORMAL LOW (ref 13.0–17.0)
RBC: 4.47 MIL/uL (ref 4.22–5.81)
WBC: 11.7 10*3/uL — ABNORMAL HIGH (ref 4.0–10.5)

## 2012-07-25 NOTE — Progress Notes (Signed)
Pt transported via [facility] to [facility].  All paperwork sent with pt and [facility].

## 2012-07-25 NOTE — Progress Notes (Signed)
   Subjective: 3 Days Post-Op Procedure(s) (LRB): TOTAL KNEE ARTHROPLASTY (Right)   Patient reports pain as mild, pain well controlled. No events throughout the night. Ready to be discharged to SNF.  Objective:   VITALS:   Filed Vitals:   07/25/12  BP: 146/70  Pulse: 69  Temp: 98.5 F (36.9 C)   Resp: 16    Neurovascular intact Dorsiflexion/Plantar flexion intact Incision: dressing C/D/I No cellulitis present Compartment soft  LABS  Basename 07/25/12 0435 07/24/12 0446 07/23/12 0433  HGB 12.0* 11.9* 11.0*  HCT 36.3* 36.8* 34.1*  WBC 11.7* 13.0* 12.1*  PLT 249 237 221     Basename 07/24/12 0700 07/23/12 0433  NA 135 131*  K 3.2* 2.8*  BUN 10 11  CREATININE 0.97 0.94  GLUCOSE 133* 117*     Assessment/Plan: 3 Days Post-Op Procedure(s) (LRB): TOTAL KNEE ARTHROPLASTY (Right)  Dressing changed Up with therapy Discharge to SNF Follow up in 2 weeks at [facility]. Follow up with Dr. Shelle Iron D in 2 weeks.  Contact information:  [facility] [address], [address] [place] [address] [phone]        Anastasio Auerbach. Christoher Drudge   PAC  07/25/2012, 9:31 AM

## 2012-07-25 NOTE — Progress Notes (Signed)
Pt to be d/c today to [facility]   Pt and family agreeable. Confirmed plans with facility.  Plan transfer via EMS.   Makena Mcgrady, LCSWA [employer] Coverage [phone]   

## 2012-07-25 NOTE — Progress Notes (Signed)
Report called to [facility], Charity fundraiser at [facility].

## 2012-10-19 ENCOUNTER — Other Ambulatory Visit: Payer: Self-pay | Admitting: Orthopedic Surgery

## 2012-11-10 ENCOUNTER — Other Ambulatory Visit: Payer: Self-pay | Admitting: Urology

## 2012-11-16 ENCOUNTER — Encounter (HOSPITAL_BASED_OUTPATIENT_CLINIC_OR_DEPARTMENT_OTHER): Payer: Self-pay | Admitting: *Deleted

## 2012-11-16 NOTE — Progress Notes (Signed)
NPO AFTER MN. ARRIVES AT 0615. NEEDS ISTAT AND EKG. MAY TAKE HYDROCODONE IF NEEDED W/ SIPS OF WATER.

## 2012-11-19 NOTE — H&P (Signed)
History of Present Illness            F/u nephrolithiasis, BPH and microhematuria.    Nephrolithiasis - pt has had several lithotripsies. None in recent years. He is on allopurinol, indapamide and hydration. He usually keeps some pain medicine on hand if he passes a stone. He rarely uses it. He's had one bottle for the past 2 years and not taken them all.  -Apr 2013 KUB Stable renal stones. Comparison CT November 2012 and KUB 2010. There were no stones in the region of the ureters or bladder. -Nov 2013 continued indapamide and allopurinol -Nov 2013 Renal US - stable LP stones. No hydronephrosis. No solid renal masses. Bladder normal with PVR 16 ml. See tech sheet for details.    BPH -Nov 2012 DRE normal, PSA 1.71 (stable) -Nov 2013 normal DRE, PSA 2.75, on tamsulosin   Microhematuria - -Nov 2012 cystoscopy was negative  -Nov 2012 Renal US - 1 cm hyperechoic stone in right, 1.4 cm hypoechoic cyst in right, no mass or hydronephrosis. Left kidney - 1.25 cm stone, 1.9 cm hyperchoic area separate from stone with hyperchoic ring. CT Nov 2012  which revealed no evidence of mass in the left kidney apart from a small 1 cm LUP cyst that needs surveillance in 6-12 mo. He also had a 6 mm left mid ureteral stone at the iliacs which likely explains his microhematuria. This calcification may be evident on CT.  -Nov 2013 [place] as above    Interval Hx He returns for CT scan. He has intermittent left flank pain and microscopic hematuria. The pain is intermittent and turning from side to side makes the pain worse.   May 2014 CT 6 mm stone persists at left prox-mid ureter. It looks a bit bigger this year. Stable LP stones. No hydro.       Past Medical History Problems  1. History of  Arthritis V13.4 2. History of  Esophageal Reflux 530.81 3. History of  Hypertension 401.9 4. History of  Urinary Calculus 592.9  Surgical History Problems  1. History of  Kidney Surgery 2. History of  Shoulder  Surgery  Current Meds 1. Aspirin 81 MG Oral Tablet; Therapy: (Recorded:20Dec2007) to 2. Hydrocodone-Acetaminophen 5-325 MG Oral Tablet; TAKE 1 TO 2 TABLETS EVERY 4 TO 6  HOURS AS NEEDED FOR PAIN; Therapy: 15Nov2012 to (Evaluate:15Dec2012); Last  Rx:15Nov2012 3. Indapamide 2.5 MG Oral Tablet; 1QD - TAKE ONE TABLET BY MOUTH EVERY DAY; Therapy:  10Jun2009 to (Evaluate:15Nov2014)  Requested for: 20Nov2013; Last Rx:20Nov2013 4. Iron TABS; Therapy: (Recorded:13May2014) to 5. Methocarbamol 500 MG Oral Tablet; Therapy: 12Apr2013 to 6. OxyCODONE HCl TABS; Therapy: (Recorded:29Apr2013) to 7. Pantoprazole Sodium 40 MG Oral Tablet Delayed Release; TAKE 1 TABLET ONCE A DAY  ORALLY; Therapy: 01Oct2012 to 8. Pindolol 5 MG Oral Tablet; Therapy: (Recorded:20Dec2007) to 9. Tamsulosin HCl 0.4 MG Oral Capsule; TAKE 1 CAPSULE Daily; Therapy: 17Dec2012 to  (Evaluate:20Mar2014)  Requested for: 20Nov2013; Last Rx:20Nov2013 10. Vitamin D TABS; Therapy: (Recorded:29Apr2013) to  Allergies Medication  1. Naprosyn TABS  Family History Problems  1. Family history of  Family Health Status Number Of Children 2 sons  Social History Problems  1. Caffeine Use 4 2. Former Smoker V15.82 3. Marital History - Currently Married 4. 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 History of  Alcohol Use 6. History of  Tobacco Use  Review of Systems constitutional Amended By: Jerilee Field; 11/09/2012 2:20 PMEST, cardiovascular Amended By: Jerilee Field; 11/09/2012 2:20 PMEST, pulmonary Amended By: Jerilee Field;  11/09/2012 2:20 PMEST, musculoskeletal Amended By: Jerilee Field; 11/09/2012 2:20 PMEST and neurological Amended By: Jerilee Field; 11/09/2012 2:20 PMEST system(s) were reviewed and pertinent findings if present are noted.    Vitals Vital Signs [Data Includes: Last 1 Day]  13May2014 01:59PM  Blood Pressure: 138 / 63 Temperature: 98.6 F Heart Rate: 63  Physical Exam Constitutional: Well  nourished and well developed . No acute distress.  Pulmonary: No respiratory distress and normal respiratory rhythm and effort.  Cardiovascular: Heart rate and rhythm are normal . No peripheral edema.  Neuro/Psych:. Mood and affect are appropriate.    Results/Data  the following images/tracing/specimen were independently visualized:  Amended By: Jerilee Field; 11/09/2012 2:20 PMEST.    Assessment Assessed  1. Ureteral Stone 592.1  Plan Ureteral Stone (592.1)  1. Follow-up Schedule Surgery Office  Follow-up  Done: 13May2014  Discussion/Summary I discussed with the patient and the CT findings and went over the images with him. We discussed the nature risks and benefits of continued surveillance, shockwave, endoscopic management with occasional need for pre-stenting. All questions answered. He elects to proceed with endoscopic management and I discussed risks of bleeding infection and ureteral injury among others. I also discussed I cannot guarantee his pain is from this ureteral stone but the stone certainly it has not progressed and looks to be slightly bigger in size.     Signatures

## 2012-11-23 ENCOUNTER — Ambulatory Visit (HOSPITAL_BASED_OUTPATIENT_CLINIC_OR_DEPARTMENT_OTHER)
Admission: RE | Admit: 2012-11-23 | Discharge: 2012-11-23 | Disposition: A | Discharge status: Home / Self Care | Payer: Medicare Other | Source: Ambulatory Visit | Attending: Urology | Admitting: Urology

## 2012-11-23 ENCOUNTER — Encounter (HOSPITAL_BASED_OUTPATIENT_CLINIC_OR_DEPARTMENT_OTHER)
Admission: RE | Disposition: A | Discharge status: Home / Self Care | Payer: Self-pay | Source: Ambulatory Visit | Attending: Urology

## 2012-11-23 ENCOUNTER — Encounter (HOSPITAL_BASED_OUTPATIENT_CLINIC_OR_DEPARTMENT_OTHER): Payer: Self-pay | Admitting: Anesthesiology

## 2012-11-23 ENCOUNTER — Ambulatory Visit (HOSPITAL_BASED_OUTPATIENT_CLINIC_OR_DEPARTMENT_OTHER): Payer: Medicare Other | Admitting: Anesthesiology

## 2012-11-23 ENCOUNTER — Encounter (HOSPITAL_BASED_OUTPATIENT_CLINIC_OR_DEPARTMENT_OTHER): Payer: Self-pay | Admitting: *Deleted

## 2012-11-23 DIAGNOSIS — N200 Calculus of kidney: Secondary | ICD-10-CM | POA: Insufficient documentation

## 2012-11-23 DIAGNOSIS — G4730 Sleep apnea, unspecified: Secondary | ICD-10-CM | POA: Insufficient documentation

## 2012-11-23 DIAGNOSIS — N400 Enlarged prostate without lower urinary tract symptoms: Secondary | ICD-10-CM | POA: Insufficient documentation

## 2012-11-23 DIAGNOSIS — Z79899 Other long term (current) drug therapy: Secondary | ICD-10-CM | POA: Insufficient documentation

## 2012-11-23 DIAGNOSIS — N201 Calculus of ureter: Secondary | ICD-10-CM | POA: Insufficient documentation

## 2012-11-23 DIAGNOSIS — I10 Essential (primary) hypertension: Secondary | ICD-10-CM | POA: Insufficient documentation

## 2012-11-23 DIAGNOSIS — K219 Gastro-esophageal reflux disease without esophagitis: Secondary | ICD-10-CM | POA: Insufficient documentation

## 2012-11-23 DIAGNOSIS — Z7982 Long term (current) use of aspirin: Secondary | ICD-10-CM | POA: Insufficient documentation

## 2012-11-23 DIAGNOSIS — Z87891 Personal history of nicotine dependence: Secondary | ICD-10-CM | POA: Insufficient documentation

## 2012-11-23 DIAGNOSIS — N35919 Unspecified urethral stricture, male, unspecified site: Secondary | ICD-10-CM | POA: Insufficient documentation

## 2012-11-23 HISTORY — DX: Obstructive sleep apnea (adult) (pediatric): G47.33

## 2012-11-23 HISTORY — PX: HOLMIUM LASER APPLICATION: SHX5852

## 2012-11-23 HISTORY — DX: Unilateral primary osteoarthritis, left knee: M17.12

## 2012-11-23 HISTORY — DX: Dependence on other enabling machines and devices: Z99.89

## 2012-11-23 HISTORY — DX: Personal history of urinary calculi: Z87.442

## 2012-11-23 HISTORY — DX: Calculus of ureter: N20.1

## 2012-11-23 LAB — POCT I-STAT 4, (NA,K, GLUC, HGB,HCT)
Glucose, Bld: 110 mg/dL — ABNORMAL HIGH (ref 70–99)
HCT: 45 % (ref 39.0–52.0)
Potassium: 3.4 mEq/L — ABNORMAL LOW (ref 3.5–5.1)
Sodium: 142 mEq/L (ref 135–145)

## 2012-11-23 SURGERY — CYSTOURETEROSCOPY, WITH STENT INSERTION
Anesthesia: General | Site: Ureter | Laterality: Left | Wound class: Clean Contaminated

## 2012-11-23 MED ORDER — MEPERIDINE HCL 25 MG/ML IJ SOLN
6.2500 mg | INTRAMUSCULAR | Status: DC | PRN
  Filled 2012-11-23: qty 1

## 2012-11-23 MED ORDER — CEFAZOLIN SODIUM 1-5 GM-% IV SOLN
1.0000 g | INTRAVENOUS | Status: DC
  Filled 2012-11-23: qty 50

## 2012-11-23 MED ORDER — LACTATED RINGERS IV SOLN
INTRAVENOUS | Status: DC
  Administered 2012-11-23: 100 mL/h via INTRAVENOUS
  Administered 2012-11-23: 09:00:00 via INTRAVENOUS
  Filled 2012-11-23: qty 1000

## 2012-11-23 MED ORDER — PROPOFOL 10 MG/ML IV BOLUS
INTRAVENOUS | Status: DC | PRN
  Administered 2012-11-23: 160 mg via INTRAVENOUS

## 2012-11-23 MED ORDER — IOHEXOL 350 MG/ML SOLN
INTRAVENOUS | Status: DC | PRN
  Administered 2012-11-23: 3 mL

## 2012-11-23 MED ORDER — LIDOCAINE HCL (CARDIAC) 20 MG/ML IV SOLN
INTRAVENOUS | Status: DC | PRN
  Administered 2012-11-23: 50 mg via INTRAVENOUS

## 2012-11-23 MED ORDER — DEXAMETHASONE SODIUM PHOSPHATE 4 MG/ML IJ SOLN
INTRAMUSCULAR | Status: DC | PRN
  Administered 2012-11-23: 8 mg via INTRAVENOUS

## 2012-11-23 MED ORDER — MIDAZOLAM HCL 5 MG/5ML IJ SOLN
INTRAMUSCULAR | Status: DC | PRN
  Administered 2012-11-23: 1 mg via INTRAVENOUS

## 2012-11-23 MED ORDER — FENTANYL CITRATE 0.05 MG/ML IJ SOLN
INTRAMUSCULAR | Status: DC | PRN
  Administered 2012-11-23 (×3): 25 ug via INTRAVENOUS
  Administered 2012-11-23: 50 ug via INTRAVENOUS
  Administered 2012-11-23: 25 ug via INTRAVENOUS

## 2012-11-23 MED ORDER — PROMETHAZINE HCL 25 MG/ML IJ SOLN
6.2500 mg | INTRAMUSCULAR | Status: DC | PRN
  Filled 2012-11-23: qty 1

## 2012-11-23 MED ORDER — CEFAZOLIN SODIUM-DEXTROSE 2-3 GM-% IV SOLR
2.0000 g | INTRAVENOUS | Status: AC
  Administered 2012-11-23: 2 g via INTRAVENOUS
  Filled 2012-11-23: qty 50

## 2012-11-23 MED ORDER — FENTANYL CITRATE 0.05 MG/ML IJ SOLN
25.0000 ug | INTRAMUSCULAR | Status: DC | PRN
  Filled 2012-11-23: qty 1

## 2012-11-23 MED ORDER — ONDANSETRON HCL 4 MG/2ML IJ SOLN
INTRAMUSCULAR | Status: DC | PRN
  Administered 2012-11-23: 4 mg via INTRAVENOUS

## 2012-11-23 MED ORDER — SODIUM CHLORIDE 0.9 % IR SOLN
Status: DC | PRN
  Administered 2012-11-23: 6000 mL via INTRAVESICAL

## 2012-11-23 MED ORDER — HYDROCODONE-ACETAMINOPHEN 5-325 MG PO TABS
1.0000 | ORAL_TABLET | Freq: Four times a day (QID) | ORAL | Status: AC | PRN
  Administered 2012-11-23: 1 via ORAL
  Filled 2012-11-23: qty 1

## 2012-11-23 MED ORDER — BELLADONNA ALKALOIDS-OPIUM 16.2-60 MG RE SUPP
RECTAL | Status: DC | PRN
  Administered 2012-11-23: 1 via RECTAL

## 2012-11-23 SURGICAL SUPPLY — 43 items
ADAPTER CATH URET PLST 4-6FR (CATHETERS) IMPLANT
ADPR CATH URET STRL DISP 4-6FR (CATHETERS)
BAG DRAIN URO-CYSTO SKYTR STRL (DRAIN) ×2 IMPLANT
BAG DRN UROCATH (DRAIN) ×1
BASKET LASER NITINOL 1.9FR (BASKET) IMPLANT
BASKET STNLS GEMINI 4WIRE 3FR (BASKET) IMPLANT
BASKET ZERO TIP NITINOL 2.4FR (BASKET) ×1 IMPLANT
BRUSH URET BIOPSY 3F (UROLOGICAL SUPPLIES) IMPLANT
BSKT STON RTRVL 120 1.9FR (BASKET)
BSKT STON RTRVL GEM 120X11 3FR (BASKET)
BSKT STON RTRVL ZERO TP 2.4FR (BASKET) ×1
CANISTER SUCT LVC 12 LTR MEDI- (MISCELLANEOUS) ×1 IMPLANT
CATH INTERMIT  6FR 70CM (CATHETERS) ×1 IMPLANT
CATH URET 5FR 28IN CONE TIP (BALLOONS)
CATH URET 5FR 28IN OPEN ENDED (CATHETERS) IMPLANT
CATH URET 5FR 70CM CONE TIP (BALLOONS) IMPLANT
CLOTH BEACON ORANGE TIMEOUT ST (SAFETY) ×2 IMPLANT
DRAPE CAMERA CLOSED 9X96 (DRAPES) ×1 IMPLANT
ELECT REM PT RETURN 9FT ADLT (ELECTROSURGICAL)
ELECTRODE REM PT RTRN 9FT ADLT (ELECTROSURGICAL) IMPLANT
GLOVE BIO SURGEON STRL SZ 6.5 (GLOVE) ×1 IMPLANT
GLOVE BIO SURGEON STRL SZ7 (GLOVE) ×1 IMPLANT
GLOVE BIO SURGEON STRL SZ7.5 (GLOVE) ×2 IMPLANT
GLOVE ECLIPSE 6.5 STRL STRAW (GLOVE) ×2 IMPLANT
GLOVE INDICATOR 7.0 STRL GRN (GLOVE) ×1 IMPLANT
GOWN PREVENTION PLUS LG XLONG (DISPOSABLE) ×3 IMPLANT
GOWN STRL REIN XL XLG (GOWN DISPOSABLE) ×2 IMPLANT
GUIDEWIRE 0.038 PTFE COATED (WIRE) IMPLANT
GUIDEWIRE ANG ZIPWIRE 038X150 (WIRE) IMPLANT
GUIDEWIRE STR DUAL SENSOR (WIRE) ×3 IMPLANT
IV NS IRRIG 3000ML ARTHROMATIC (IV SOLUTION) ×4 IMPLANT
KIT BALLIN UROMAX 15FX10 (LABEL) IMPLANT
KIT BALLN UROMAX 15FX4 (MISCELLANEOUS) IMPLANT
KIT BALLN UROMAX 26 75X4 (MISCELLANEOUS)
PACK CYSTOSCOPY (CUSTOM PROCEDURE TRAY) ×2 IMPLANT
SET HIGH PRES BAL DIL (LABEL)
SHEATH ACCESS URETERAL 24CM (SHEATH) ×1 IMPLANT
SHEATH ACCESS URETERAL 38CM (SHEATH) IMPLANT
SHEATH ACCESS URETERAL 54CM (SHEATH) IMPLANT
SHEATH URET ACCESS 12FR/35CM (UROLOGICAL SUPPLIES) IMPLANT
SHEATH URET ACCESS 12FR/55CM (UROLOGICAL SUPPLIES) IMPLANT
STENT URET 6FRX26 CONTOUR (STENTS) ×1 IMPLANT
SYRINGE IRR TOOMEY STRL 70CC (SYRINGE) IMPLANT

## 2012-11-23 NOTE — Interval H&P Note (Signed)
History and Physical Interval Note:  11/23/2012 7:33 AM  Danella Sensing  has presented today for surgery, with the diagnosis of LEFT URETERAL STONE  The various methods of treatment have been discussed with the patient and family. After consideration of risks, benefits and other options for treatment, the patient has consented to  Procedure(s): LEFT URETEROSCOPY AND LASER LITHOTRIPSY AND STENT PLACEMENT (Left) HOLMIUM LASER APPLICATION (Left) as a surgical intervention. I discussed side effects of the proposed treatment, the likelihood of the patient achieving the goals of the procedure, and any potential problems that might occur during the procedure or recuperation. All questions answered. Patient elects to proceed. The patient's history has been reviewed, patient examined, no change in status, stable for surgery. He has had nasal congestion for a few days but no fever, SOB, dysuria. I have reviewed the patient's chart and labs.      Antony Haste

## 2012-11-23 NOTE — Anesthesia Procedure Notes (Signed)
Procedure Name: LMA Insertion Date/Time: 11/23/2012 7:40 AM Performed by: Renella Cunas D Pre-anesthesia Checklist: Patient identified, Emergency Drugs available, Suction available and Patient being monitored Patient Re-evaluated:Patient Re-evaluated prior to inductionOxygen Delivery Method: Circle System Utilized Preoxygenation: Pre-oxygenation with 100% oxygen Intubation Type: IV induction Ventilation: Mask ventilation without difficulty LMA: LMA with gastric port inserted LMA Size: 4.0 and 5.0 Number of attempts: 1 Airway Equipment and Method: bite block Placement Confirmation: positive ETCO2 Tube secured with: Tape Dental Injury: Teeth and Oropharynx as per pre-operative assessment

## 2012-11-23 NOTE — Anesthesia Postprocedure Evaluation (Signed)
  Anesthesia Post-op Note  Patient: Darrell Perez  Procedure(s) Performed: Procedure(s) (LRB): LEFT URETEROSCOPY AND LASER LITHOTRIPSY AND STENT PLACEMENT (Left) HOLMIUM LASER APPLICATION (Left)  Patient Location: PACU  Anesthesia Type: General  Level of Consciousness: awake and alert   Airway and Oxygen Therapy: Patient Spontanous Breathing  Post-op Pain: mild  Post-op Assessment: Post-op Vital signs reviewed, Patient's Cardiovascular Status Stable, Respiratory Function Stable, Patent Airway and No signs of Nausea or vomiting  Last Vitals:  Filed Vitals:   11/23/12 0945  BP: 148/73  Pulse: 65  Temp:   Resp: 13    Post-op Vital Signs: stable   Complications: No apparent anesthesia complications

## 2012-11-23 NOTE — Anesthesia Preprocedure Evaluation (Addendum)
Anesthesia Evaluation  Patient identified by MRN, date of birth, ID band Patient awake    Reviewed: Allergy & Precautions, H&P , NPO status , Patient's Chart, lab work & pertinent test results, reviewed documented beta blocker date and time   Airway Mallampati: II TM Distance: >3 FB Neck ROM: full    Dental no notable dental hx.    Pulmonary asthma , sleep apnea and Continuous Positive Airway Pressure Ventilation ,  breath sounds clear to auscultation  Pulmonary exam normal       Cardiovascular Exercise Tolerance: Good hypertension, Rhythm:regular Rate:Normal     Neuro/Psych negative neurological ROS  negative psych ROS   GI/Hepatic Neg liver ROS, hiatal hernia, GERD-  Medicated,  Endo/Other  negative endocrine ROS  Renal/GU Renal disease  negative genitourinary   Musculoskeletal   Abdominal   Peds  Hematology negative hematology ROS (+)   Anesthesia Other Findings   Reproductive/Obstetrics negative OB ROS                           Anesthesia Physical  Anesthesia Plan  ASA: III  Anesthesia Plan: General   Post-op Pain Management:    Induction: Intravenous  Airway Management Planned: LMA and Oral ETT  Additional Equipment:   Intra-op Plan:   Post-operative Plan:   Informed Consent: I have reviewed the patients History and Physical, chart, labs and discussed the procedure including the risks, benefits and alternatives for the proposed anesthesia with the patient or authorized representative who has indicated his/her understanding and acceptance.   Dental Advisory Given and Dental advisory given  Plan Discussed with: CRNA  Anesthesia Plan Comments:        Anesthesia Quick Evaluation

## 2012-11-23 NOTE — Op Note (Signed)
Preoperative diagnosis: Left ureteral stone, bilateral renal stones Postop diagnosis: Urethral stricture, Left ureteral stone, bilateral renal stones  Procedure: Exam under anesthesia Cystoscopy Left retrograde pyelogram Left ureteroscopy Holmium laser lithotripsy Left ureteral stent placement with tether  Surgeon: Ena Demary Type of anesthesia: Gen.  Findings: On exam under anesthesia the penis and testicles were normal without mass lesion or hard nodule. The prostate was mildly enlarged but smooth without hard area or nodule.  On cystoscopy there was a grade 3 urethral stricture at the proximal bulb that was gently and carefully dilated with the scope.  Left retrograde pyelogram on scout imaging there was a large calcification over the region of the right lower pole and one over the region of the left lower pole consistent with the known renal stones. No stones were seen over the course of the ureters. After injection of contrast the distal ureter was normal without filling defect or stricture or dilation. The ureter went up over the iliacs and there was an irregular filling defect consistent with the stone in proximal to this the ureter appeared normal without filling defect stricture or dilation. At the UPJ there was a bit of narrowing at the collecting system filled out normally without filling defect or dilation. The calyces appeared sharp.   On ureteroscopy the stone was found in the mid ureter just over the iliacs. In the proximal ureter there was a web of tissue possibly from prior procedures. This was laser ablated and opened up after only a few hits with the laser. The UPJ narrowed and I did not work the scope into the collecting system because there were no findings here on the CT apart from the known left lower pole stone. All the stone fragments were removed and after all lithotripsy the sheath was backed out on the ureteroscope and the ureter inspected in its entirety and noted to  be normal without perforation, stricture or stone.   Description of procedure:  After consent was obtained patient brought the operating room. A timeout was performed to confirm the patient and procedure after adequate anesthesia. He is placed in lithotomy position and prepped and draped in the usual fashion. Exam under anesthesia was performed in a B&O suppository placed. The cystoscope was then passed per urethra and a left retrograde pyelogram was obtained with a 6 [place] open-ended catheter after cannulating the left ureteral orifice. Retrograde contrast was injected with a 10 cc syringe and fluoroscopic images were obtained. A sensor wire was then advanced and coiled in the collecting system. A rigid ureteroscope was advanced but I could not get up over the iliacs. Therefore a second wire was placed under direct vision in the rigid scope removed. An access sheath was then advanced without difficulty. The flexible ureteroscope was then advanced through the access sheath up into the mid ureter where the stone was found. It was lasered into several small pieces which traveled proximally and I was able to get all the pieces out with a 0 tip Nitinol basket. In the proximal ureter there was a web of tissue which was laser ablated and sprung apart. The scope was then passed back up to the UPJ and the access sheath backed out on the scope. The ureter was inspected in its entirety and noted to be normal as stated above. The wire was then backloaded on the cystoscope and a 6 x 26 cm stent was advanced. A good coil seen in the collecting system and a good coil in the bladder. The scope  was broken and allow the bladder drained and the scope removed leaving the string in place. The patient was then awakened taken to recovery room in stable condition.  Complications: None  Blood loss: Minimal  Specimens: Stone fragments given the patient  Drains: 6 x 26 left ureteral stent with string.

## 2012-11-23 NOTE — Transfer of Care (Signed)
Immediate Anesthesia Transfer of Care Note  Patient: Darrell Perez  Procedure(s) Performed: Procedure(s) (LRB): LEFT URETEROSCOPY AND LASER LITHOTRIPSY AND STENT PLACEMENT (Left) HOLMIUM LASER APPLICATION (Left)  Patient Location: PACU  Anesthesia Type: General  Level of Consciousness: awake, oriented, sedated and patient cooperative  Airway & Oxygen Therapy: Patient Spontanous Breathing and Patient connected to face mask oxygen  Post-op Assessment: Report given to PACU RN and Post -op Vital signs reviewed and stable  Post vital signs: Reviewed and stable  Complications: No apparent anesthesia complications

## 2012-11-24 ENCOUNTER — Encounter (HOSPITAL_BASED_OUTPATIENT_CLINIC_OR_DEPARTMENT_OTHER): Payer: Self-pay | Admitting: Urology

## 2012-12-06 ENCOUNTER — Other Ambulatory Visit: Payer: Self-pay | Admitting: Orthopedic Surgery

## 2012-12-06 ENCOUNTER — Encounter (HOSPITAL_COMMUNITY): Payer: Self-pay | Admitting: Pharmacy Technician

## 2012-12-06 NOTE — H&P (Signed)
Darrell Perez DOB: 12/08/1940  H&P date: 12/01/12  Chief Complaint: left knee pain  History of Present Illness The patient is a 72 year old male who comes in today for a preoperative History and Physical. The patient is scheduled for a left total knee arthroplasty to be performed by Dr. Jeffrey C. Beane, MD at [facility] on December 16, 2012. They are now 4 years out from when symptoms began. He reports bilateral knee pain, worse with activities, better with rest, refractory to steroid injections, NSAIDs, pain medications, quad strengthening, HEP, brace, activity modification, relative rest. He did well with R TKA in 1/14 and is now scheduled for left knee.  Dr. Beane and the patient have mutually agreed to proceed with a right total knee replacement. Risks and benefits of the procedure were discussed including stiffness, suboptimal range of motion, persistent pain, infection requiring removal of prosthesis and reinsertion, need for prophylactic antibiotics in the future, for example, dental procedures, possible need for manipulation, revision in the future and also anesthetic complications including DVT, PE, etc. We discussed the perioperative course, time in the hospital, postoperative recovery and the need for elevation to control swelling. We also discussed the predicted range of motion and the probability that squatting and kneeling would be unobtainable in the future. In addition, postoperative anticoagulation was discussed. We previously obtained preoperative medical clearance for R TKA done in January, health stable, no changes (Dr. James Osborne, PCP). Provided illustrated handout and discussed it in detail. They will enroll in the total joint replacement educational forum at the hospital.  Past Medical History Sleep Apnea. uses CPAP Impaired Hearing Vertigo. intermittent and transient Cataract. s/p surgery Hiatal  Hernia Hemorrhoids Osteoarthritis Measles Mumps Hypertension Gastroesophageal Reflux Disease Kidney Stone  Allergies Naprosyn *ANALGESICS - ANTI-INFLAMMATORY*. jittery, nervous  Family History Heart Disease. mother Hypertension. mother Cerebrovascular Accident. mother Dementia. mother, father  Social History Advance Directives. Living Will, POA Current work status. retired accountant Living situation. lives with spouse. one story home with 4 steps to enter home. has walker at home. Tobacco use. Never smoker. never smoker Exercise. Exercises monthly; does running / walking Alcohol use. current drinker; drinks beer and wine; only occasionally per week Tobacco / smoke exposure. no Children. 2 Marital status. married Post-Surgical Plans. [place]  Medication History Pindolol (5MG Tablet, Oral) Active. Indapamide (2.5MG Tablet, Oral) Active. Allopurinol (100MG Tablet, Oral) Active. Amoxicillin (500MG Tablet, Oral) Active. (prn dentist appts) K-Dur (20MEQ Tablet ER, Oral) Active. Aspirin (325MG Tablet, 1 (one) Oral) Active. ZyrTEC Allergy (10MG Capsule, Oral) Active. Vitamin D3 (1000UNIT Capsule, Oral) Active. Robaxin (500MG Tablet, Oral) Active.  Past Surgical History Total Knee Replacement - Right. 06/2012 Dr Beane Cataract Surgery. bilateral- 2005, 2012 Carpal Tunnel Repair. bilateral- 1989, 1991 Lithotripsy. kidney stones 1972, 1977 Hand Surgery. trigger finger release 2006 Shoulder Surgery. right shoulder hemiarthroplasty 2007, conversion R shoulder hemi to reverse shoulder arthroplasty 2013 Neck Surgery. goiter, 1967 Arthroscopy of Shoulder. right  Review of Systems General:Not Present- Chills, Fever, Night Sweats, Fatigue, Weight Gain, Weight Loss and Memory Loss. Skin:Present- Itching. Not Present- Hives, Rash, Eczema and Lesions. HEENT:Present- Hearing Loss. Not Present- Tinnitus, Headache, Double Vision, Visual Loss and  Dentures. Respiratory:Not Present- Shortness of breath with exertion, Shortness of breath at rest, Allergies, Coughing up blood and Chronic Cough. Cardiovascular:Not Present- Chest Pain, Racing/skipping heartbeats, Difficulty Breathing Lying Down, Murmur, Swelling and Palpitations. Gastrointestinal:Present- Diarrhea. Not Present- Bloody Stool, Heartburn, Abdominal Pain, Vomiting, Nausea, Constipation, Difficulty Swallowing, Jaundice and Loss of appetitie. Male Genitourinary:Present-   Urinary frequency and Urinating at Night. Not Present- Blood in Urine, Weak urinary stream, Discharge, Flank Pain, Incontinence, Painful Urination, Urgency and Urinary Retention. Musculoskeletal:Present- Joint Pain and Back Pain. Not Present- Muscle Weakness, Muscle Pain, Joint Swelling, Morning Stiffness and Spasms. Neurological:Present- Dizziness. Not Present- Tremor, Blackout spells, Paralysis, Difficulty with balance and Weakness. Psychiatric:Not Present- Insomnia  Vitals 12/01/2012 11:08 AM Weight: 207 lb Height: 68 in Body Surface Area: 2.12 m Body Mass Index: 31.47 kg/m Pulse: 65 (Regular) BP: 139/66 (Sitting, Left Arm, Standard)  Physical Exam The physical exam findings are as follows:  General Mental Status - Alert, cooperative and good historian. General Appearance- pleasant. Not in acute distress. Orientation- Oriented X3. Build & Nutrition- Well nourished and Well developed.  Head and Neck Head- normocephalic, atraumatic . Neck Global Assessment- supple. no bruit auscultated on the right and no bruit auscultated on the left.  Eye Pupil- Bilateral- Regular and Round. Motion- Bilateral- EOMI.  Chest and Lung Exam Auscultation: Breath sounds:- clear at anterior chest wall and - clear at posterior chest wall. Adventitious sounds:- No Adventitious sounds.  Cardiovascular Auscultation:Rhythm- Regular rate and rhythm. Heart Sounds- S1 WNL and S2  WNL. Murmurs & Other Heart Sounds:Auscultation of the heart reveals - No Murmurs.  Abdomen Palpation/Percussion:Tenderness- Abdomen is non-tender to palpation. Rigidity (guarding)- Abdomen is soft. Auscultation:Auscultation of the abdomen reveals - Bowel sounds normal.  Male Genitourinary Not done, not pertinent to present illness  Musculoskeletal On the left knee he has a varus deformity, varus thrust. Tender patellofemoral joint, medial joint line. Range is -3 to about 120.  Imaging AP standing and lateral demonstrates endstage osteoarthrosis, bone on bone of the left knee as well with varus deformity. Right knee, excellent placement.  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  Pt scheduled for L TKA with Dr Beane on 12/16/12. Again discussed surgery itself as well as risks, complications, and alternatives including but not limited to DVT, PE, infx, bleeding, failure of procedure, need for secondary procedure, anesthesia risk, even death. He desires to proceed. Will hold ASA, NSAIDs, vitamins accordingly pre-op. Remain NPO after MN. Plan for [facility] upon D/c from hospital. Last time he was D/C'd on Ultram and Norco and had some pain control issues, plan to D/C with percocet for pain this time. Follow up 10-14 days post-op for staple removal.   Plan left total knee arthroplasty  Signed electronically by Jatasia Gundrum M Dragon Thrush, PA-C for Dr. Beane  

## 2012-12-07 NOTE — Patient Instructions (Signed)
Darrell Perez  12/07/2012   Your procedure is scheduled on:  12/16/12    Report to [facility] at   0530  AM.  Call this number if you have problems the morning of surgery: [phone]   Remember:   Do not eat food or drink liquids after midnight.   Take these medicines the morning of surgery with A SIP OF WATER:    Do not wear jewelry,   Do not wear lotions, powders, or perfumes. .  . Men may shave face and neck.  Do not bring valuables to the hospital.  Contacts, dentures or bridgework may not be worn into surgery.  Leave suitcase in the car. After surgery it may be brought to your room.  For patients admitted to the hospital, checkout time is 11:00 AM the day of  discharge.     SEE CHG INSTRUCTION SHEET    Please read over the following fact sheets that you were given: MRSA Information, coughing and deep breathing exercises, leg exercises, Blood Transfusion Fact sheet, Incentive Spirometry Fact sheet                Failure to comply with these instructions may result in cancellation of your surgery.                Patient Signature ____________________________              Nurse Signature _____________________________

## 2012-12-08 ENCOUNTER — Ambulatory Visit (HOSPITAL_COMMUNITY)
Admission: RE | Admit: 2012-12-08 | Discharge: 2012-12-08 | Disposition: A | Discharge status: Home / Self Care | Payer: Medicare Other | Source: Ambulatory Visit | Attending: Orthopedic Surgery | Admitting: Orthopedic Surgery

## 2012-12-08 ENCOUNTER — Ambulatory Visit (HOSPITAL_COMMUNITY)
Admission: RE | Admit: 2012-12-08 | Discharge: 2012-12-08 | Disposition: A | Discharge status: Home / Self Care | Payer: Medicare Other | Source: Ambulatory Visit | Attending: Specialist | Admitting: Specialist

## 2012-12-08 ENCOUNTER — Encounter (HOSPITAL_COMMUNITY): Payer: Self-pay

## 2012-12-08 ENCOUNTER — Encounter (HOSPITAL_COMMUNITY)
Admission: RE | Admit: 2012-12-08 | Discharge: 2012-12-08 | Disposition: A | Discharge status: Home / Self Care | Payer: Medicare Other | Source: Ambulatory Visit | Attending: Specialist | Admitting: Specialist

## 2012-12-08 DIAGNOSIS — Z01810 Encounter for preprocedural cardiovascular examination: Secondary | ICD-10-CM | POA: Insufficient documentation

## 2012-12-08 DIAGNOSIS — I10 Essential (primary) hypertension: Secondary | ICD-10-CM | POA: Insufficient documentation

## 2012-12-08 DIAGNOSIS — Z0183 Encounter for blood typing: Secondary | ICD-10-CM | POA: Insufficient documentation

## 2012-12-08 DIAGNOSIS — M898X9 Other specified disorders of bone, unspecified site: Secondary | ICD-10-CM | POA: Insufficient documentation

## 2012-12-08 DIAGNOSIS — M47814 Spondylosis without myelopathy or radiculopathy, thoracic region: Secondary | ICD-10-CM | POA: Insufficient documentation

## 2012-12-08 DIAGNOSIS — Z01812 Encounter for preprocedural laboratory examination: Secondary | ICD-10-CM | POA: Insufficient documentation

## 2012-12-08 DIAGNOSIS — M25569 Pain in unspecified knee: Secondary | ICD-10-CM | POA: Insufficient documentation

## 2012-12-08 DIAGNOSIS — Z01818 Encounter for other preprocedural examination: Secondary | ICD-10-CM | POA: Insufficient documentation

## 2012-12-08 LAB — CBC
MCH: 26.2 pg (ref 26.0–34.0)
MCHC: 32.3 g/dL (ref 30.0–36.0)
Platelets: 317 10*3/uL (ref 150–400)
RBC: 5.12 MIL/uL (ref 4.22–5.81)
RDW: 15.5 % (ref 11.5–15.5)

## 2012-12-08 LAB — APTT: aPTT: 34 seconds (ref 24–37)

## 2012-12-08 LAB — COMPREHENSIVE METABOLIC PANEL
ALT: 22 U/L (ref 0–53)
AST: 24 U/L (ref 0–37)
Calcium: 9.3 mg/dL (ref 8.4–10.5)
Sodium: 141 mEq/L (ref 135–145)
Total Protein: 6.8 g/dL (ref 6.0–8.3)

## 2012-12-08 LAB — URINALYSIS, ROUTINE W REFLEX MICROSCOPIC
Hgb urine dipstick: NEGATIVE
Ketones, ur: NEGATIVE mg/dL
Leukocytes, UA: NEGATIVE
Protein, ur: NEGATIVE mg/dL
Urobilinogen, UA: 0.2 mg/dL (ref 0.0–1.0)

## 2012-12-08 LAB — PROTIME-INR: INR: 0.9 (ref 0.00–1.49)

## 2012-12-16 ENCOUNTER — Inpatient Hospital Stay (HOSPITAL_COMMUNITY)
Admission: RE | Admit: 2012-12-16 | Discharge: 2012-12-19 | DRG: 470 | Disposition: A | Discharge status: Skilled Nursing Facility | Payer: Medicare Other | Source: Ambulatory Visit | Attending: Specialist | Admitting: Specialist

## 2012-12-16 ENCOUNTER — Encounter (HOSPITAL_COMMUNITY): Payer: Self-pay | Admitting: *Deleted

## 2012-12-16 ENCOUNTER — Inpatient Hospital Stay (HOSPITAL_COMMUNITY): Payer: Medicare Other

## 2012-12-16 ENCOUNTER — Encounter (HOSPITAL_COMMUNITY)
Admission: RE | Disposition: A | Discharge status: Skilled Nursing Facility | Payer: Self-pay | Source: Ambulatory Visit | Attending: Specialist

## 2012-12-16 ENCOUNTER — Ambulatory Visit (HOSPITAL_COMMUNITY): Payer: Medicare Other | Admitting: *Deleted

## 2012-12-16 DIAGNOSIS — M150 Primary generalized (osteo)arthritis: Secondary | ICD-10-CM | POA: Insufficient documentation

## 2012-12-16 DIAGNOSIS — M1712 Unilateral primary osteoarthritis, left knee: Secondary | ICD-10-CM

## 2012-12-16 DIAGNOSIS — G4733 Obstructive sleep apnea (adult) (pediatric): Secondary | ICD-10-CM | POA: Diagnosis present

## 2012-12-16 DIAGNOSIS — E876 Hypokalemia: Secondary | ICD-10-CM | POA: Diagnosis not present

## 2012-12-16 DIAGNOSIS — I10 Essential (primary) hypertension: Secondary | ICD-10-CM | POA: Diagnosis present

## 2012-12-16 DIAGNOSIS — M1710 Unilateral primary osteoarthritis, unspecified knee: Principal | ICD-10-CM | POA: Diagnosis present

## 2012-12-16 HISTORY — PX: TOTAL KNEE ARTHROPLASTY: SHX125

## 2012-12-16 LAB — TYPE AND SCREEN
ABO/RH(D): O POS
Antibody Screen: NEGATIVE

## 2012-12-16 SURGERY — ARTHROPLASTY, KNEE, TOTAL
Anesthesia: General | Site: Knee | Laterality: Left | Wound class: Clean

## 2012-12-16 MED ORDER — METOCLOPRAMIDE HCL 10 MG PO TABS: 5.0000 mg | ORAL_TABLET | Freq: Three times a day (TID) | ORAL | Status: DC | PRN

## 2012-12-16 MED ORDER — PINDOLOL 5 MG PO TABS
5.0000 mg | ORAL_TABLET | Freq: Every evening | ORAL | Status: DC
  Administered 2012-12-16 – 2012-12-18 (×3): 5 mg via ORAL
  Filled 2012-12-16 (×4): qty 1

## 2012-12-16 MED ORDER — POTASSIUM CHLORIDE IN NACL 20-0.9 MEQ/L-% IV SOLN
INTRAVENOUS | Status: DC
  Administered 2012-12-16 – 2012-12-17 (×2): via INTRAVENOUS
  Filled 2012-12-16 (×6): qty 1000

## 2012-12-16 MED ORDER — SODIUM CHLORIDE 0.9 % IR SOLN
Status: DC | PRN
  Administered 2012-12-16: 3000 mL

## 2012-12-16 MED ORDER — CEFAZOLIN SODIUM-DEXTROSE 2-3 GM-% IV SOLR
2.0000 g | Freq: Four times a day (QID) | INTRAVENOUS | Status: AC
  Administered 2012-12-16 (×2): 2 g via INTRAVENOUS
  Filled 2012-12-16 (×2): qty 50

## 2012-12-16 MED ORDER — SODIUM CHLORIDE 0.9 % IR SOLN
Status: DC | PRN
  Administered 2012-12-16: 08:00:00

## 2012-12-16 MED ORDER — RIVAROXABAN 10 MG PO TABS: 10.0000 mg | ORAL_TABLET | Freq: Every day | ORAL | Status: DC

## 2012-12-16 MED ORDER — BUPIVACAINE-EPINEPHRINE PF 0.25-1:200000 % IJ SOLN
INTRAMUSCULAR | Status: AC
  Filled 2012-12-16: qty 30

## 2012-12-16 MED ORDER — TRAMADOL HCL 50 MG PO TABS: 50.0000 mg | ORAL_TABLET | Freq: Four times a day (QID) | ORAL | Status: DC | PRN

## 2012-12-16 MED ORDER — VITAMIN D3 25 MCG (1000 UNIT) PO TABS
1000.0000 [IU] | ORAL_TABLET | Freq: Every day | ORAL | Status: DC
  Administered 2012-12-16 – 2012-12-19 (×4): 1000 [IU] via ORAL
  Filled 2012-12-16 (×4): qty 1

## 2012-12-16 MED ORDER — BUPIVACAINE-EPINEPHRINE 0.5% -1:200000 IJ SOLN
INTRAMUSCULAR | Status: DC | PRN
  Administered 2012-12-16: 30 mL

## 2012-12-16 MED ORDER — ACETAMINOPHEN 10 MG/ML IV SOLN: 1000.0000 mg | Freq: Once | INTRAVENOUS | Status: DC | PRN

## 2012-12-16 MED ORDER — BUPIVACAINE IN DEXTROSE 0.75-8.25 % IT SOLN
INTRATHECAL | Status: DC | PRN
  Administered 2012-12-16: 2 mL via INTRATHECAL

## 2012-12-16 MED ORDER — METHOCARBAMOL 100 MG/ML IJ SOLN
500.0000 mg | Freq: Four times a day (QID) | INTRAVENOUS | Status: DC | PRN
  Administered 2012-12-16: 500 mg via INTRAVENOUS
  Filled 2012-12-16 (×2): qty 5

## 2012-12-16 MED ORDER — SODIUM CHLORIDE 0.9 % IJ SOLN
INTRAMUSCULAR | Status: DC | PRN
  Administered 2012-12-16: 30 mL

## 2012-12-16 MED ORDER — DOCUSATE SODIUM 100 MG PO CAPS
100.0000 mg | ORAL_CAPSULE | Freq: Two times a day (BID) | ORAL | Status: DC
  Administered 2012-12-16 – 2012-12-19 (×7): 100 mg via ORAL
  Filled 2012-12-16 (×3): qty 1

## 2012-12-16 MED ORDER — HYDROMORPHONE HCL PF 1 MG/ML IJ SOLN
INTRAMUSCULAR | Status: AC
  Administered 2012-12-16: 0.5 mg via INTRAVENOUS
  Filled 2012-12-16: qty 1

## 2012-12-16 MED ORDER — MEPERIDINE HCL 50 MG/ML IJ SOLN: 6.2500 mg | INTRAMUSCULAR | Status: DC | PRN

## 2012-12-16 MED ORDER — BUPIVACAINE LIPOSOME 1.3 % IJ SUSP
20.0000 mL | Freq: Once | INTRAMUSCULAR | Status: DC
  Filled 2012-12-16: qty 20

## 2012-12-16 MED ORDER — CEFAZOLIN SODIUM-DEXTROSE 2-3 GM-% IV SOLR
2.0000 g | INTRAVENOUS | Status: AC
  Administered 2012-12-16: 2 g via INTRAVENOUS

## 2012-12-16 MED ORDER — TAMSULOSIN HCL 0.4 MG PO CAPS
0.4000 mg | ORAL_CAPSULE | Freq: Every day | ORAL | Status: DC
  Administered 2012-12-16 – 2012-12-19 (×4): 0.4 mg via ORAL
  Filled 2012-12-16 (×4): qty 1

## 2012-12-16 MED ORDER — OXYCODONE HCL 5 MG/5ML PO SOLN
5.0000 mg | Freq: Once | ORAL | Status: DC | PRN
  Filled 2012-12-16: qty 5

## 2012-12-16 MED ORDER — LORATADINE 10 MG PO TABS
10.0000 mg | ORAL_TABLET | Freq: Every day | ORAL | Status: DC
  Administered 2012-12-16 – 2012-12-19 (×4): 10 mg via ORAL
  Filled 2012-12-16 (×4): qty 1

## 2012-12-16 MED ORDER — PANTOPRAZOLE SODIUM 40 MG PO TBEC
40.0000 mg | DELAYED_RELEASE_TABLET | Freq: Every day | ORAL | Status: DC
  Administered 2012-12-16 – 2012-12-19 (×4): 40 mg via ORAL
  Filled 2012-12-16 (×4): qty 1

## 2012-12-16 MED ORDER — CEFAZOLIN SODIUM-DEXTROSE 2-3 GM-% IV SOLR
INTRAVENOUS | Status: DC | PRN
  Administered 2012-12-16: 2 g via INTRAVENOUS

## 2012-12-16 MED ORDER — HYDROMORPHONE HCL PF 1 MG/ML IJ SOLN
INTRAMUSCULAR | Status: AC
  Administered 2012-12-16: 1 mg
  Filled 2012-12-16: qty 1

## 2012-12-16 MED ORDER — PROPOFOL INFUSION 10 MG/ML OPTIME
INTRAVENOUS | Status: DC | PRN
  Administered 2012-12-16: 75 ug/kg/min via INTRAVENOUS

## 2012-12-16 MED ORDER — PROPOFOL 10 MG/ML IV BOLUS
INTRAVENOUS | Status: DC | PRN
  Administered 2012-12-16: 30 mg via INTRAVENOUS

## 2012-12-16 MED ORDER — PHENOL 1.4 % MT LIQD
1.0000 | OROMUCOSAL | Status: DC | PRN
  Filled 2012-12-16: qty 177

## 2012-12-16 MED ORDER — CHLORHEXIDINE GLUCONATE 4 % EX LIQD
60.0000 mL | Freq: Once | CUTANEOUS | Status: DC
  Filled 2012-12-16: qty 60

## 2012-12-16 MED ORDER — OXYCODONE HCL 5 MG PO TABS: 5.0000 mg | ORAL_TABLET | Freq: Once | ORAL | Status: DC | PRN

## 2012-12-16 MED ORDER — PHENYLEPHRINE HCL 10 MG/ML IJ SOLN
INTRAMUSCULAR | Status: DC | PRN
  Administered 2012-12-16 (×2): 40 ug via INTRAVENOUS
  Administered 2012-12-16: 80 ug via INTRAVENOUS
  Administered 2012-12-16: 40 ug via INTRAVENOUS

## 2012-12-16 MED ORDER — ACETAMINOPHEN 650 MG RE SUPP: 650.0000 mg | Freq: Four times a day (QID) | RECTAL | Status: DC | PRN

## 2012-12-16 MED ORDER — METHOCARBAMOL 500 MG PO TABS
500.0000 mg | ORAL_TABLET | Freq: Four times a day (QID) | ORAL | Status: DC | PRN
  Administered 2012-12-16 – 2012-12-19 (×6): 500 mg via ORAL
  Filled 2012-12-16 (×5): qty 1
  Filled 2012-12-16: qty 11

## 2012-12-16 MED ORDER — ONDANSETRON HCL 4 MG PO TABS: 4.0000 mg | ORAL_TABLET | Freq: Four times a day (QID) | ORAL | Status: DC | PRN

## 2012-12-16 MED ORDER — POTASSIUM CHLORIDE CRYS ER 20 MEQ PO TBCR
20.0000 meq | EXTENDED_RELEASE_TABLET | Freq: Two times a day (BID) | ORAL | Status: DC
  Administered 2012-12-16 – 2012-12-19 (×7): 20 meq via ORAL
  Filled 2012-12-16 (×8): qty 1

## 2012-12-16 MED ORDER — BUPIVACAINE-EPINEPHRINE (PF) 0.5% -1:200000 IJ SOLN
INTRAMUSCULAR | Status: AC
  Filled 2012-12-16: qty 10

## 2012-12-16 MED ORDER — OXYCODONE HCL 5 MG PO TABS
5.0000 mg | ORAL_TABLET | ORAL | Status: DC | PRN
  Administered 2012-12-16 (×2): 10 mg via ORAL
  Administered 2012-12-16: 5 mg via ORAL
  Administered 2012-12-17 (×5): 10 mg via ORAL
  Administered 2012-12-17 (×2): 5 mg via ORAL
  Administered 2012-12-17 – 2012-12-18 (×3): 10 mg via ORAL
  Administered 2012-12-19: 5 mg via ORAL
  Filled 2012-12-16 (×3): qty 2
  Filled 2012-12-16 (×4): qty 1
  Filled 2012-12-16 (×6): qty 2
  Filled 2012-12-16: qty 1
  Filled 2012-12-16: qty 2

## 2012-12-16 MED ORDER — HYDROMORPHONE HCL PF 1 MG/ML IJ SOLN
0.2500 mg | INTRAMUSCULAR | Status: DC | PRN
  Administered 2012-12-16 (×4): 0.5 mg via INTRAVENOUS

## 2012-12-16 MED ORDER — PROMETHAZINE HCL 25 MG/ML IJ SOLN: 6.2500 mg | INTRAMUSCULAR | Status: DC | PRN

## 2012-12-16 MED ORDER — ALLOPURINOL 100 MG PO TABS
100.0000 mg | ORAL_TABLET | Freq: Every day | ORAL | Status: DC
  Administered 2012-12-16 – 2012-12-18 (×3): 100 mg via ORAL
  Filled 2012-12-16 (×4): qty 1

## 2012-12-16 MED ORDER — BUPIVACAINE-EPINEPHRINE 0.25% -1:200000 IJ SOLN
INTRAMUSCULAR | Status: DC | PRN
  Administered 2012-12-16: 30 mL

## 2012-12-16 MED ORDER — MENTHOL 3 MG MT LOZG
1.0000 | LOZENGE | OROMUCOSAL | Status: DC | PRN
  Filled 2012-12-16: qty 9

## 2012-12-16 MED ORDER — RIVAROXABAN 10 MG PO TABS
10.0000 mg | ORAL_TABLET | Freq: Every day | ORAL | Status: DC
  Administered 2012-12-17 – 2012-12-19 (×3): 10 mg via ORAL
  Filled 2012-12-16 (×4): qty 1

## 2012-12-16 MED ORDER — OXYCODONE-ACETAMINOPHEN 5-325 MG PO TABS: 1.0000 | ORAL_TABLET | ORAL | Status: DC | PRN

## 2012-12-16 MED ORDER — METOCLOPRAMIDE HCL 5 MG/ML IJ SOLN
5.0000 mg | Freq: Three times a day (TID) | INTRAMUSCULAR | Status: DC | PRN
  Administered 2012-12-16 – 2012-12-17 (×2): 10 mg via INTRAVENOUS
  Filled 2012-12-16 (×2): qty 2

## 2012-12-16 MED ORDER — HYDROMORPHONE HCL PF 1 MG/ML IJ SOLN
1.0000 mg | INTRAMUSCULAR | Status: DC | PRN
  Administered 2012-12-16 – 2012-12-17 (×3): 1 mg via INTRAVENOUS
  Filled 2012-12-16 (×4): qty 1

## 2012-12-16 MED ORDER — LACTATED RINGERS IV SOLN
INTRAVENOUS | Status: DC
  Administered 2012-12-16 (×3): via INTRAVENOUS

## 2012-12-16 MED ORDER — HYDROMORPHONE HCL PF 1 MG/ML IJ SOLN
0.5000 mg | INTRAMUSCULAR | Status: DC | PRN
  Administered 2012-12-16: 0.5 mg via INTRAVENOUS
  Filled 2012-12-16 (×2): qty 1

## 2012-12-16 MED ORDER — BUPIVACAINE LIPOSOME 1.3 % IJ SUSP
INTRAMUSCULAR | Status: DC | PRN
  Administered 2012-12-16: 20 mL

## 2012-12-16 MED ORDER — CEFAZOLIN SODIUM-DEXTROSE 2-3 GM-% IV SOLR
INTRAVENOUS | Status: AC
  Filled 2012-12-16: qty 50

## 2012-12-16 MED ORDER — ACETAMINOPHEN 325 MG PO TABS
650.0000 mg | ORAL_TABLET | Freq: Four times a day (QID) | ORAL | Status: DC | PRN
  Administered 2012-12-19: 650 mg via ORAL
  Filled 2012-12-16: qty 2

## 2012-12-16 MED ORDER — INDAPAMIDE 2.5 MG PO TABS
2.5000 mg | ORAL_TABLET | Freq: Every evening | ORAL | Status: DC
  Administered 2012-12-16 – 2012-12-18 (×3): 2.5 mg via ORAL
  Filled 2012-12-16 (×4): qty 1

## 2012-12-16 MED ORDER — ONDANSETRON HCL 4 MG/2ML IJ SOLN
4.0000 mg | Freq: Four times a day (QID) | INTRAMUSCULAR | Status: DC | PRN
  Administered 2012-12-16 – 2012-12-17 (×3): 4 mg via INTRAVENOUS
  Filled 2012-12-16 (×3): qty 2

## 2012-12-16 SURGICAL SUPPLY — 70 items
BAG SPEC THK2 15X12 ZIP CLS (MISCELLANEOUS) ×1
BAG ZIPLOCK 12X15 (MISCELLANEOUS) ×2 IMPLANT
BANDAGE ELASTIC 4 VELCRO ST LF (GAUZE/BANDAGES/DRESSINGS) ×2 IMPLANT
BANDAGE ELASTIC 6 VELCRO ST LF (GAUZE/BANDAGES/DRESSINGS) ×2 IMPLANT
BANDAGE ESMARK 6X9 LF (GAUZE/BANDAGES/DRESSINGS) ×1 IMPLANT
BLADE SAG 18X100X1.27 (BLADE) ×2 IMPLANT
BLADE SAW SGTL 13.0X1.19X90.0M (BLADE) ×2 IMPLANT
BNDG CMPR 9X6 STRL LF SNTH (GAUZE/BANDAGES/DRESSINGS) ×1
BNDG ESMARK 6X9 LF (GAUZE/BANDAGES/DRESSINGS) ×2
CAPT RP KNEE ×1 IMPLANT
CEMENT HV SMART SET (Cement) ×4 IMPLANT
CHLORAPREP W/TINT 26ML (MISCELLANEOUS) IMPLANT
CLOTH 2% CHLOROHEXIDINE 3PK (PERSONAL CARE ITEMS) ×2 IMPLANT
CLOTH BEACON ORANGE TIMEOUT ST (SAFETY) ×2 IMPLANT
CUFF TOURN SGL QUICK 34 (TOURNIQUET CUFF) ×2
CUFF TRNQT CYL 34X4X40X1 (TOURNIQUET CUFF) ×1 IMPLANT
DECANTER SPIKE VIAL GLASS SM (MISCELLANEOUS) ×2 IMPLANT
DRAPE LG THREE QUARTER DISP (DRAPES) ×3 IMPLANT
DRAPE ORTHO SPLIT 77X108 STRL (DRAPES) ×4
DRAPE POUCH INSTRU U-SHP 10X18 (DRAPES) ×2 IMPLANT
DRAPE SURG ORHT 6 SPLT 77X108 (DRAPES) ×2 IMPLANT
DRAPE U-SHAPE 47X51 STRL (DRAPES) ×2 IMPLANT
DRSG ADAPTIC 3X8 NADH LF (GAUZE/BANDAGES/DRESSINGS) ×1 IMPLANT
DRSG AQUACEL AG ADV 3.5X10 (GAUZE/BANDAGES/DRESSINGS) ×1 IMPLANT
DRSG PAD ABDOMINAL 8X10 ST (GAUZE/BANDAGES/DRESSINGS) ×1 IMPLANT
DRSG TEGADERM 4X4.75 (GAUZE/BANDAGES/DRESSINGS) ×2 IMPLANT
DURAPREP 26ML APPLICATOR (WOUND CARE) ×2 IMPLANT
ELECT REM PT RETURN 9FT ADLT (ELECTROSURGICAL) ×2
ELECTRODE REM PT RTRN 9FT ADLT (ELECTROSURGICAL) ×1 IMPLANT
EVACUATOR 1/8 PVC DRAIN (DRAIN) ×2 IMPLANT
FACESHIELD LNG OPTICON STERILE (SAFETY) ×10 IMPLANT
GAUZE SPONGE 2X2 8PLY STRL LF (GAUZE/BANDAGES/DRESSINGS) IMPLANT
GLOVE BIOGEL PI IND STRL 7.5 (GLOVE) ×1 IMPLANT
GLOVE BIOGEL PI IND STRL 8 (GLOVE) ×1 IMPLANT
GLOVE BIOGEL PI INDICATOR 7.5 (GLOVE) ×1
GLOVE BIOGEL PI INDICATOR 8 (GLOVE) ×1
GLOVE SURG SS PI 7.5 STRL IVOR (GLOVE) ×2 IMPLANT
GLOVE SURG SS PI 8.0 STRL IVOR (GLOVE) ×4 IMPLANT
GOWN PREVENTION PLUS LG XLONG (DISPOSABLE) ×2 IMPLANT
GOWN PREVENTION PLUS XLARGE (GOWN DISPOSABLE) ×2 IMPLANT
GOWN STRL REIN XL XLG (GOWN DISPOSABLE) ×4 IMPLANT
HANDPIECE INTERPULSE COAX TIP (DISPOSABLE) ×2
IMMOBILIZER KNEE 20 (SOFTGOODS) ×2
IMMOBILIZER KNEE 20 THIGH 36 (SOFTGOODS) ×1 IMPLANT
KIT BASIN OR (CUSTOM PROCEDURE TRAY) ×2 IMPLANT
MANIFOLD NEPTUNE II (INSTRUMENTS) ×2 IMPLANT
NDL SAFETY ECLIPSE 18X1.5 (NEEDLE) IMPLANT
NEEDLE 27GAX1X1/2 (NEEDLE) ×1 IMPLANT
NEEDLE HYPO 18GX1.5 SHARP (NEEDLE) ×4
NS IRRIG 1000ML POUR BTL (IV SOLUTION) ×2 IMPLANT
PACK TOTAL JOINT (CUSTOM PROCEDURE TRAY) ×2 IMPLANT
PADDING CAST COTTON 6X4 STRL (CAST SUPPLIES) ×1 IMPLANT
POSITIONER SURGICAL ARM (MISCELLANEOUS) ×2 IMPLANT
SET HNDPC FAN SPRY TIP SCT (DISPOSABLE) ×1 IMPLANT
SPONGE GAUZE 2X2 STER 10/PKG (GAUZE/BANDAGES/DRESSINGS) ×1
SPONGE SURGIFOAM ABS GEL 100 (HEMOSTASIS) ×2 IMPLANT
STAPLER VISISTAT (STAPLE) ×2 IMPLANT
SUCTION FRAZIER 12FR DISP (SUCTIONS) ×2 IMPLANT
SUT BONE WAX W31G (SUTURE) ×2 IMPLANT
SUT VIC AB 1 CT1 27 (SUTURE) ×4
SUT VIC AB 1 CT1 27XBRD ANTBC (SUTURE) ×4 IMPLANT
SUT VIC AB 2-0 CT1 27 (SUTURE) ×6
SUT VIC AB 2-0 CT1 TAPERPNT 27 (SUTURE) ×3 IMPLANT
SUT VLOC 180 0 24IN GS25 (SUTURE) ×2 IMPLANT
SYR 30ML LL (SYRINGE) ×3 IMPLANT
TOWEL OR 17X26 10 PK STRL BLUE (TOWEL DISPOSABLE) ×3 IMPLANT
TOWER CARTRIDGE SMART MIX (DISPOSABLE) ×2 IMPLANT
TRAY FOLEY CATH 14FRSI W/METER (CATHETERS) ×2 IMPLANT
WATER STERILE IRR 1500ML POUR (IV SOLUTION) ×3 IMPLANT
WRAP KNEE MAXI GEL POST OP (GAUZE/BANDAGES/DRESSINGS) ×2 IMPLANT

## 2012-12-16 NOTE — Brief Op Note (Signed)
12/16/2012  9:34 AM  PATIENT:  Darrell Perez  72 y.o. male  PRE-OPERATIVE DIAGNOSIS:  DJD LEFT KNEE  POST-OPERATIVE DIAGNOSIS:  DJD LEFT KNEE  PROCEDURE:  Procedure(s): LEFT TOTAL KNEE ARTHROPLASTY (Left)  SURGEON:  Surgeon(s) and Role:    * Javier Docker, MD - Primary  PHYSICIAN ASSISTANT:   ASSISTANTS: Bissell   ANESTHESIA:   general  EBL:  Total I/O In: 2300 [I.V.:2300] Out: 435 [Urine:435]  BLOOD ADMINISTERED:none  DRAINS: (1) Hemovact drain(s) in the 1 with  Suction Open   LOCAL MEDICATIONS USED:  MARCAINE     SPECIMEN:  No Specimen  DISPOSITION OF SPECIMEN:  N/A  COUNTS:  YES  TOURNIQUET:  * Missing tourniquet times found for documented tourniquets in log:  [id number] *  DICTATION: .Other Dictation: Dictation Number [id number]  PLAN OF CARE: Admit to inpatient   PATIENT DISPOSITION:  PACU - hemodynamically stable.   Delay start of Pharmacological VTE agent (>24hrs) due to surgical blood loss or risk of bleeding: no

## 2012-12-16 NOTE — H&P (View-Only) (Signed)
Darrell Perez DOB: 11-24-1940  H&P date: 12/01/12  Chief Complaint: left knee pain  History of Present Illness The patient is a 72 year old male who comes in today for a preoperative History and Physical. The patient is scheduled for a left total knee arthroplasty to be performed by Dr. Javier Docker, MD at [facility] on December 16, 2012. They are now 4 years out from when symptoms began. He reports bilateral knee pain, worse with activities, better with rest, refractory to steroid injections, NSAIDs, pain medications, quad strengthening, HEP, brace, activity modification, relative rest. He did well with R TKA in 1/14 and is now scheduled for left knee.  Dr. Shelle Iron and the patient have mutually agreed to proceed with a right total knee replacement. Risks and benefits of the procedure were discussed including stiffness, suboptimal range of motion, persistent pain, infection requiring removal of prosthesis and reinsertion, need for prophylactic antibiotics in the future, for example, dental procedures, possible need for manipulation, revision in the future and also anesthetic complications including DVT, PE, etc. We discussed the perioperative course, time in the hospital, postoperative recovery and the need for elevation to control swelling. We also discussed the predicted range of motion and the probability that squatting and kneeling would be unobtainable in the future. In addition, postoperative anticoagulation was discussed. We previously obtained preoperative medical clearance for R TKA done in January, health stable, no changes (Dr. Theressa Millard, PCP). Provided illustrated handout and discussed it in detail. They will enroll in the total joint replacement educational forum at the hospital.  Past Medical History Sleep Apnea. uses CPAP Impaired Hearing Vertigo. intermittent and transient Cataract. s/p surgery Hiatal  Hernia Hemorrhoids Osteoarthritis Measles Mumps Hypertension Gastroesophageal Reflux Disease Kidney Stone  Allergies Naprosyn *ANALGESICS - ANTI-INFLAMMATORY*. jittery, nervous  Family History Heart Disease. mother Hypertension. mother Cerebrovascular Accident. mother Dementia. 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 one story home with 4 steps to enter home. has walker at home. Tobacco use. Never smoker. never smoker Exercise. Exercises monthly; does running / walking Alcohol use. current drinker; drinks beer and wine; only occasionally per week Tobacco / smoke exposure. no Children. 2 Marital status. married Post-Surgical Plans. [place]  Medication History Pindolol (5MG  Tablet, Oral) Active. Indapamide (2.5MG  Tablet, Oral) Active. Allopurinol (100MG  Tablet, Oral) Active. Amoxicillin (500MG  Tablet, Oral) Active. (prn dentist appts) K-Dur ( Tablet ER, Oral) Active. Aspirin (325MG  Tablet, 1 (one) Oral) Active. ZyrTEC Allergy (10MG  Capsule, Oral) Active. Vitamin D3 (1000UNIT Capsule, Oral) Active. Robaxin (500MG  Tablet, Oral) Active.  Past Surgical History Total Knee Replacement - Right. 06/2012 Dr Shelle Iron Cataract Surgery. bilateral- 2005, 2012 Carpal Tunnel Repair. bilateral- 1989, 1991 Lithotripsy. kidney stones 1972, 1977 Hand Surgery. trigger finger release 2006 Shoulder Surgery. right shoulder hemiarthroplasty 2007, conversion R shoulder hemi to reverse shoulder arthroplasty 2013 Neck Surgery. goiter, 1967 Arthroscopy of Shoulder. right  Review of Systems General:Not Present- Chills, Fever, Night Sweats, Fatigue, Weight Gain, Weight Loss and Memory Loss. Skin:Present- Itching. Not Present- Hives, Rash, Eczema and Lesions. HEENT:Present- Hearing Loss. Not Present- Tinnitus, Headache, Double Vision, Visual Loss and  Dentures. Respiratory:Not Present- Shortness of breath with exertion, Shortness of breath at rest, Allergies, Coughing up blood and Chronic Cough. Cardiovascular:Not Present- Chest Pain, Racing/skipping heartbeats, Difficulty Breathing Lying Down, Murmur, Swelling and Palpitations. Gastrointestinal:Present- Diarrhea. Not Present- Bloody Stool, Heartburn, Abdominal Pain, Vomiting, Nausea, Constipation, Difficulty Swallowing, Jaundice and Loss of appetitie. Male Genitourinary:Present-  Urinary frequency and Urinating at Night. Not Present- Blood in Urine, Weak urinary stream, Discharge, Flank Pain, Incontinence, Painful Urination, Urgency and Urinary Retention. Musculoskeletal:Present- Joint Pain and Back Pain. Not Present- Muscle Weakness, Muscle Pain, Joint Swelling, Morning Stiffness and Spasms. Neurological:Present- Dizziness. Not Present- Tremor, Blackout spells, Paralysis, Difficulty with balance and Weakness. Psychiatric:Not Present- Insomnia  Vitals 12/01/2012 11:08 AM Weight: 207 lb Height: 68 in Body Surface Area: 2.12 m Body Mass Index: 31.47 kg/m Pulse: 65 (Regular) BP: 139/66 (Sitting, Left Arm, Standard)  Physical Exam The physical exam findings are as follows:  General Mental Status - Alert, cooperative and good historian. General Appearance- pleasant. Not in acute distress. Orientation- Oriented X3. Build & Nutrition- Well nourished and Well developed.  Head and Neck Head- normocephalic, atraumatic . Neck Global Assessment- supple. no bruit auscultated on the right and no bruit auscultated on the left.  Eye Pupil- Bilateral- Regular and Round. Motion- Bilateral- EOMI.  Chest and Lung Exam Auscultation: Breath sounds:- clear at anterior chest wall and - clear at posterior chest wall. Adventitious sounds:- No Adventitious sounds.  Cardiovascular Auscultation:Rhythm- Regular rate and rhythm. Heart Sounds- S1 WNL and S2  WNL. Murmurs & Other Heart Sounds:Auscultation of the heart reveals - No Murmurs.  Abdomen Palpation/Percussion:Tenderness- Abdomen is non-tender to palpation. Rigidity (guarding)- Abdomen is soft. Auscultation:Auscultation of the abdomen reveals - Bowel sounds normal.  Male Genitourinary Not done, not pertinent to present illness  Musculoskeletal On the left knee he has a varus deformity, varus thrust. Tender patellofemoral joint, medial joint line. Range is -3 to about 120.  Imaging AP standing and lateral demonstrates endstage osteoarthrosis, bone on bone of the left knee as well with varus deformity. Right knee, excellent placement.  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  Pt scheduled for L TKA with Dr Shelle Iron on 12/16/12. Again discussed surgery itself as well as risks, complications, and alternatives including but not limited to DVT, PE, infx, bleeding, failure of procedure, need for secondary procedure, anesthesia risk, even death. He desires to proceed. Will hold ASA, NSAIDs, vitamins accordingly pre-op. Remain NPO after MN. Plan for [facility] upon D/c from hospital. Last time he was D/C'd on Ultram and Norco and had some pain control issues, plan to D/C with percocet for pain this time. Follow up 10-14 days post-op for staple removal.   Plan left total knee arthroplasty  Signed electronically by Dorothy Spark, PA-C for Dr. Shelle Iron

## 2012-12-16 NOTE — Interval H&P Note (Signed)
History and Physical Interval Note:  12/16/2012 6:42 AM  Darrell Perez  has presented today for surgery, with the diagnosis of DJD LEFT KNEE  The various methods of treatment have been discussed with the patient and family. After consideration of risks, benefits and other options for treatment, the patient has consented to  Procedure(s): LEFT TOTAL KNEE ARTHROPLASTY (Left) as a surgical intervention .  The patient's history has been reviewed, patient examined, no change in status, stable for surgery.  I have reviewed the patient's chart and labs.  Questions were answered to the patient's satisfaction.     Britini Garcilazo C

## 2012-12-16 NOTE — Anesthesia Procedure Notes (Addendum)
Spinal  Patient location during procedure: OR Start time: 12/16/2012 7:25 AM End time: 12/16/2012 7:30 AM Staffing Anesthesiologist: Lewie Loron R Performed by: anesthesiologist  Preanesthetic Checklist Completed: patient identified, site marked, surgical consent, pre-op evaluation, timeout performed, IV checked, risks and benefits discussed and monitors and equipment checked Spinal Block Patient position: sitting Prep: ChloraPrep Patient monitoring: heart rate, continuous pulse ox and blood pressure Approach: midline Location: L3-4 Injection technique: single-shot Needle Needle type: Quincke  Needle gauge: 22 G Needle length: 9 cm Assessment Sensory level: T8 Additional Notes Expiration date of kit checked and confirmed. Patient tolerated procedure well, without complications.

## 2012-12-16 NOTE — Evaluation (Signed)
Physical Therapy Evaluation Patient Details Name: Darrell Perez MRN: [id number] DOB: 1940-07-25 Today's Date: 12/16/2012 Time: 1478-2956 PT Time Calculation (min): 23 min  PT Assessment / Plan / Recommendation Clinical Impression  Pt s/p L TKR presents with decreased L LE strength/ROM and post op pain and nausea limiting functional mobility    PT Assessment  Patient needs continued PT services    Follow Up Recommendations  SNF    Does the patient have the potential to tolerate intense rehabilitation      Barriers to Discharge        Equipment Recommendations  None recommended by PT    Recommendations for Other Services OT consult   Frequency 7X/week    Precautions / Restrictions Precautions Precautions: Knee;Fall Required Braces or Orthoses: Knee Immobilizer - Left Knee Immobilizer - Left: Discontinue once straight leg raise with < 10 degree lag Restrictions Weight Bearing Restrictions: No Other Position/Activity Restrictions: WBAT   Pertinent Vitals/Pain 8/10; premed, IV MEds during session, ice pack provided      Mobility  Bed Mobility Bed Mobility: Supine to Sit;Sit to Supine Supine to Sit: 1: +2 Total assist Supine to Sit: Patient Percentage: 50% Sit to Supine: 1: +2 Total assist Sit to Supine: Patient Percentage: 50% Details for Bed Mobility Assistance: assist with L LE and trunk Transfers Transfers: Sit to Stand;Stand to Sit Sit to Stand: 1: +2 Total assist;With upper extremity assist;From bed Sit to Stand: Patient Percentage: 60% Stand to Sit: 1: +2 Total assist;To bed;With upper extremity assist Stand to Sit: Patient Percentage: 60% Details for Transfer Assistance: cues for LE management and use of UEs to self assist Ambulation/Gait Ambulation/Gait Assistance: 1: +2 Total assist Ambulation/Gait: Patient Percentage: 60% Ambulation Distance (Feet): 3 Feet Assistive device: Rolling walker Ambulation/Gait Assistance Details: Pt side stepped up to top  of bed Gait velocity: slow General Gait Details: Pt ltd by nausea Stairs: No    Exercises     PT Diagnosis: Difficulty walking  PT Problem List: Decreased strength;Decreased range of motion;Decreased activity tolerance;Decreased mobility;Decreased knowledge of use of DME;Obesity;Pain PT Treatment Interventions: DME instruction;Gait training;Stair training;Functional mobility training;Therapeutic activities;Therapeutic exercise;Patient/family education   PT Goals Acute Rehab PT Goals PT Goal Formulation: With patient Time For Goal Achievement: 12/16/12 Potential to Achieve Goals: Good Pt will go Supine/Side to Sit: with supervision PT Goal: Supine/Side to Sit - Progress: Goal set today Pt will go Sit to Supine/Side: with supervision PT Goal: Sit to Supine/Side - Progress: Goal set today Pt will go Sit to Stand: with supervision PT Goal: Sit to Stand - Progress: Goal set today Pt will go Stand to Sit: with supervision PT Goal: Stand to Sit - Progress: Goal set today Pt will Ambulate: 51 - 150 feet;with supervision;with rolling walker PT Goal: Ambulate - Progress: Goal set today  Visit Information  Last PT Received On: 12/16/12 Assistance Needed: +2    Subjective Data  Subjective: I'm hurting but I want to try to move  Patient Stated Goal: Rehab and home   Prior Functioning  Home Living Lives With: Spouse Prior Function Level of Independence: Independent Able to Take Stairs?: Yes Driving: Yes Communication Communication: No difficulties    Cognition  Cognition Arousal/Alertness: Awake/alert Behavior During Therapy: WFL for tasks assessed/performed Overall Cognitive Status: Within Functional Limits for tasks assessed    Extremity/Trunk Assessment Right Upper Extremity Assessment RUE ROM/Strength/Tone: [facility] for tasks assessed Left Upper Extremity Assessment LUE ROM/Strength/Tone: [facility] for tasks assessed Right Lower Extremity Assessment RLE ROM/Strength/Tone: [facility] for  tasks assessed Left Lower Extremity Assessment LLE ROM/Strength/Tone: Deficits LLE ROM/Strength/Tone Deficits: 2/5 quads; ROM NT 2* pain   Balance    End of Session PT - End of Session Equipment Utilized During Treatment: Gait belt;Left knee immobilizer Activity Tolerance: Patient limited by pain;Other (comment) (nausea) Patient left: in bed;with call bell/phone within reach;with family/visitor present Nurse Communication: Mobility status CPM Left Knee CPM Left Knee: Off  GP     Reign Dziuba 12/16/2012, 5:47 PM

## 2012-12-16 NOTE — Preoperative (Addendum)
Beta Blockers   Reason not to administer Beta Blockers:Not Applicable, took home BB last pm

## 2012-12-16 NOTE — Transfer of Care (Signed)
Immediate Anesthesia Transfer of Care Note  Patient: Darrell Perez  Procedure(s) Performed: Procedure(s): LEFT TOTAL KNEE ARTHROPLASTY (Left)  Patient Location: PACU  Anesthesia Type:MAC and Spinal  Level of Consciousness: awake, alert , patient cooperative and responds to stimulation  Airway & Oxygen Therapy: Patient Spontanous Breathing and Patient connected to face mask oxygen  Post-op Assessment: Report given to PACU RN and Post -op Vital signs reviewed and stable  Post vital signs: Reviewed and stable  Complications: No apparent anesthesia complications

## 2012-12-16 NOTE — Anesthesia Postprocedure Evaluation (Signed)
Anesthesia Post Note  Patient: Darrell Perez  Procedure(s) Performed: Procedure(s) (LRB): LEFT TOTAL KNEE ARTHROPLASTY (Left)  Anesthesia type: Spinal  Patient location: PACU  Post pain: Pain level controlled  Post assessment: Post-op Vital signs reviewed  Last Vitals: BP 149/78  Pulse 48  Temp(Src) 36.4 C (Oral)  Resp 15  Ht 5\' 10"  (1.778 m)  Wt 209 lb 12.8 oz (95.165 kg)  BMI 30.1 kg/m2  SpO2 96%  Post vital signs: Reviewed  Level of consciousness: sedated  Complications: No apparent anesthesia complications

## 2012-12-16 NOTE — Progress Notes (Signed)
Clinical Social Work Department CLINICAL SOCIAL WORK PLACEMENT NOTE 12/16/2012  Patient:  Darrell Perez, Darrell Perez  Account Number:  [id number] Admit date:  12/16/2012  Clinical Social Worker:  Cori Razor, LCSW  Date/time:  12/16/2012 02:54 PM  Clinical Social Work is seeking post-discharge placement for this patient at the following level of care:   SKILLED NURSING   (*CSW will update this form in Epic as items are completed)     Patient/family provided with [facility] Department of Clinical Social Work's list of facilities offering this level of care within the geographic area requested by the patient (or if unable, by the patient's family).  12/16/2012  Patient/family informed of their freedom to choose among providers that offer the needed level of care, that participate in Medicare, Medicaid or managed care program needed by the patient, have an available bed and are willing to accept the patient.    Patient/family informed of [facility]' ownership interest in [facility], as well as of the fact that they are under no obligation to receive care at this facility.  PASARR submitted to [employer] on  PASARR number received from [employer] on 07/22/2012  FL2 transmitted to all facilities in geographic area requested by pt/family on  12/16/2012 FL2 transmitted to all facilities within larger geographic area on   Patient informed that his/her managed care company has contracts with or will negotiate with  certain facilities, including the following:     Patient/family informed of bed offers received:  12/16/2012 Patient chooses bed at [facility] Physician recommends and patient chooses bed at    Patient to be transferred to [facility] on   Patient to be transferred to facility by   The following physician request were entered in Epic:   Additional Comments:  Cori Razor LCSW [phone]

## 2012-12-16 NOTE — Anesthesia Preprocedure Evaluation (Addendum)
Anesthesia Evaluation  Patient identified by MRN, date of birth, ID band Patient awake    Reviewed: Allergy & Precautions, H&P , NPO status , Patient's Chart, lab work & pertinent test results, reviewed documented beta blocker date and time   Airway Mallampati: II TM Distance: >3 FB Neck ROM: full    Dental no notable dental hx. (+) Dental Advisory Given   Pulmonary asthma , sleep apnea and Continuous Positive Airway Pressure Ventilation ,  breath sounds clear to auscultation  Pulmonary exam normal       Cardiovascular Exercise Tolerance: Good hypertension, Rhythm:regular Rate:Normal     Neuro/Psych negative neurological ROS  negative psych ROS   GI/Hepatic Neg liver ROS, hiatal hernia, GERD-  Medicated,  Endo/Other  negative endocrine ROS  Renal/GU Renal disease     Musculoskeletal  (+) Arthritis -,   Abdominal   Peds  Hematology negative hematology ROS (+)   Anesthesia Other Findings   Reproductive/Obstetrics                           Anesthesia Physical  Anesthesia Plan  ASA: III  Anesthesia Plan: Spinal   Post-op Pain Management:    Induction: Intravenous  Airway Management Planned:   Additional Equipment:   Intra-op Plan:   Post-operative Plan:   Informed Consent: I have reviewed the patients History and Physical, chart, labs and discussed the procedure including the risks, benefits and alternatives for the proposed anesthesia with the patient or authorized representative who has indicated his/her understanding and acceptance.   Dental Advisory Given and Dental advisory given  Plan Discussed with: CRNA  Anesthesia Plan Comments:        Anesthesia Quick Evaluation

## 2012-12-16 NOTE — Progress Notes (Signed)
Utilization review completed.  

## 2012-12-16 NOTE — Progress Notes (Signed)
Clinical Social Work Department BRIEF PSYCHOSOCIAL ASSESSMENT 12/16/2012  Patient:  Darrell Perez, Darrell Perez     Account Number:  [id number]     Admit date:  12/16/2012  Clinical Social Worker:  Candie Chroman  Date/Time:  12/16/2012 02:37 PM  Referred by:  Physician  Date Referred:  12/16/2012 Referred for  SNF Placement   Other Referral:   Interview type:  Patient Other interview type:    PSYCHOSOCIAL DATA Living Status:  WIFE Admitted from facility:   Level of care:   Primary support name:  Darrell Perez Primary support relationship to patient:  SPOUSE Degree of support available:   supportive    CURRENT CONCERNS Current Concerns  Post-Acute Placement   Other Concerns:    SOCIAL WORK ASSESSMENT / PLAN Pt is a 72 yr old gentleman living at home prior to hospitalization. CSW met with pt / spouse to assist with d/c planning. Pt has made prior arrangements to have ST SNF placement at [facility] following hospital d/c. CSW has contacted SNF and d/c plan has been confirmed pending [facility] Medicare authorization. CSW will begin insurance authorization once PT Eval is available.   Assessment/plan status:  Psychosocial Support/Ongoing Assessment of Needs Other assessment/ plan:   Information/referral to community resources:   Insurance coverage for SNF placement reviewed.    PATIENT'S/FAMILY'S RESPONSE TO PLAN OF CARE: Pt has been to [facility] in the past and is looking forward to having rehab there again when stable for d/c.   Cori Razor LCSW [phone]

## 2012-12-16 NOTE — Op Note (Signed)
NAME:  Darrell Perez, Darrell Perez NO.:  [id number]  MEDICAL RECORD NO.:  [id number]  LOCATION:  [facility]                         FACILITY:  [facility]  PHYSICIAN:  Jene Every, M.D.    DATE OF BIRTH:  06/27/1941  DATE OF PROCEDURE:  12/16/2012 DATE OF DISCHARGE:                              OPERATIVE REPORT   PREOPERATIVE DIAGNOSIS:  End-stage osteoarthritis of the left knee.  POSTOPERATIVE DIAGNOSIS:  End-stage osteoarthritis of the left knee.  PROCEDURE PERFORMED:  Left total knee arthroplasty utilizing 2 rotating platform, 4 femur, 4 tibia, 10 mm insert, 38 patella.  SURGEON:  Jene Every, M.D.  ASSISTANT:  Lovey Newcomer, PA  HISTORY:  This is a 72 year old male with end-stage osteoarthrosis of knee refractory to conservative treatment, bone on bone arthritis, disabling pain refractory to pain medication, corticosteroid injection, arthroscopy, and physical therapy, indicated for replacement of degenerated joint.  Risks and benefits discussed including bleeding, infection, damage to neurovascular structures, DVT, PE, anesthetic complications, etc.  TECHNIQUE:  With the patient in supine position, after induction of adequate general anesthesia and 2 g Kefzol, left lower extremity was prepped and draped in usual sterile fashion.  Thigh tourniquet inflated up to 300 mmHg.  Midline incision was made.  Full thickness flaps were developed.  Median parapatellar arthrotomy was performed.  I elevated the soft tissues medially preserving the MCL, flexed the knee, everted the patella.  Tricompartmental osteoarthrosis at medial compartment was noted.  Removed remnants of medial and lateral menisci and the ACL. Step drill utilized in the femoral canal.  Irrigated, T-handle reamer, 5- degree left intramedullary guide, 11 off the distal femur due to slight flexion contracture, and distal cut was performed.  Used a sizing guide off the anterior femur which was a 4 and 3 degrees of  external rotation pin, and we performed anterior-posterior and chamfer cuts protecting the soft tissues.  Attention was turned towards the tibia, subluxed.  Guides were utilized.  External alignment guides were off the defect which was medial.  Pinned slight posterior slope parallel to the tibia bisecting the ankle joint.  Oscillating saw performed the cut.  Protecting the soft tissues posteriorly.  We then turned our attention with use of flexion extension block ________, turned our attention back towards completing the tibia, maximizing surface just medial to the tibial tubercle.  Pin, central drill and punch guide utilized.  Turned our attention towards the femur.  Box cut placed.  After the box cut, guide placed on distal femur, flushed with the lateral distal femur.  Box cut performed.  Residual PCL removed.  The trial femur was placed and tibia 10 mm insert.  Good flexion and good extension.  Good stability, varus valgus stressing 0-30 degrees, measured the patella 26, removed the 9, planed it to a 17 using the 38 guide, drilled our PEG holes medializing those.  Placed a trial patella and had excellent patellofemoral tracking with the trial.  All instrumentation was removed.  We checked posteriorly, removed any osteophytes, cauterized the geniculates. Copiously irrigated the wound with pulsatile lavage and pressure.  Knee was flexed.  All surfaces were dried while the cement was mixed on the back table.  We injected cement in the tibial canal and digitally pressurized it.  We used a 4 tray impacted into place, 4 femur impacted into place, 10 mm insert trial reduced it.  Axial load applied throughout the curing of the cement.  Cemented the patella.  After curing of the cement, we had good flexion and extension, good stability and varus valgus stressing 0-30 degrees, shows the 10.  We removed the 10, meticulously removed any redundant cement.  Copiously irrigated the wound, subluxed  the knee, subluxed the tibia, placed a 10 mm insert, reduced it, full extension, full flexion, good stability with varus valgus stressing at 0 and 30 degrees.  Excellent patellofemoral tracking.  We used then Exparel for an analgesic as well as 0.25% Marcaine, 30% of each.  Hemovac was placed and brought out through a lateral stab wound in the skin.  The knee was slightly flexed. Reapproximated the patellar arthrotomy with #1 Vicryl in a running V- Loc, subcu with 2-0, skin with staples.  He had flexion to gravity at 90 degrees.  The wound was dressed sterilely.  Tourniquet was deflated with adequate revascularization of lower extremity appreciated.  The patient tolerated the procedure well.  No complications.     Jene Every, M.D.     Cordelia Pen  D:  12/16/2012  T:  12/16/2012  Job:  [id number]

## 2012-12-17 ENCOUNTER — Encounter (HOSPITAL_COMMUNITY): Payer: Self-pay | Admitting: Specialist

## 2012-12-17 LAB — BASIC METABOLIC PANEL
BUN: 14 mg/dL (ref 6–23)
Chloride: 97 mEq/L (ref 96–112)
Creatinine, Ser: 0.91 mg/dL (ref 0.50–1.35)
GFR calc Af Amer: >90 mL/min (ref >90)
GFR calc non Af Amer: 83 mL/min — ABNORMAL LOW (ref >90)

## 2012-12-17 LAB — CBC
HCT: 37.1 % — ABNORMAL LOW (ref 39.0–52.0)
MCH: 25.7 pg — ABNORMAL LOW (ref 26.0–34.0)
MCHC: 31.5 g/dL (ref 30.0–36.0)
MCV: 81.4 fL (ref 78.0–100.0)
RDW: 15.8 % — ABNORMAL HIGH (ref 11.5–15.5)

## 2012-12-17 NOTE — Progress Notes (Signed)
Physical Therapy Treatment Patient Details Name: Darrell Perez MRN: [id number] DOB: 11-23-1940 Today's Date: 12/17/2012 Time: 4098-1191 PT Time Calculation (min): 27 min  PT Assessment / Plan / Recommendation Comments on Treatment Session       Follow Up Recommendations  SNF     Does the patient have the potential to tolerate intense rehabilitation     Barriers to Discharge        Equipment Recommendations  None recommended by PT    Recommendations for Other Services OT consult  Frequency 7X/week   Plan Discharge plan remains appropriate    Precautions / Restrictions Precautions Precautions: Knee;Fall Required Braces or Orthoses: Knee Immobilizer - Left Knee Immobilizer - Left: Discontinue once straight leg raise with < 10 degree lag Restrictions Weight Bearing Restrictions: No Other Position/Activity Restrictions: WBAT   Pertinent Vitals/Pain 5/10; premedicated, cold packs provided    Mobility  Bed Mobility Bed Mobility: Supine to Sit Supine to Sit: 1: +2 Total assist Supine to Sit: Patient Percentage: 60% Details for Bed Mobility Assistance: assist with L LE and trunk Transfers Transfers: Sit to Stand;Stand to Sit Sit to Stand: 1: +2 Total assist;With upper extremity assist;From bed Sit to Stand: Patient Percentage: 60% Stand to Sit: 1: +2 Total assist;With upper extremity assist;To chair/3-in-1 Stand to Sit: Patient Percentage: 60% Details for Transfer Assistance: cues for LE management and use of UEs to self assist Ambulation/Gait Ambulation/Gait Assistance: 1: +2 Total assist Ambulation/Gait: Patient Percentage: 70% Ambulation Distance (Feet): 8 Feet Assistive device: Rolling walker Ambulation/Gait Assistance Details: cues for sequence, posture and position from RW Gait Pattern: Step-to pattern;Decreased stride length;Decreased stance time - left Gait velocity: slow General Gait Details: Pt ltd by nausea Stairs: No    Exercises Total Joint  Exercises Ankle Circles/Pumps: AROM;10 reps;Supine;Both Quad Sets: AROM;10 reps;Supine;Both Heel Slides: AAROM;Left;10 reps;Supine Straight Leg Raises: AAROM;Left;10 reps;Supine Goniometric ROM: AAROM at knee -10 - 40   PT Diagnosis:    PT Problem List:   PT Treatment Interventions:     PT Goals Acute Rehab PT Goals PT Goal Formulation: With patient Time For Goal Achievement: 12/16/12 Potential to Achieve Goals: Good Pt will go Supine/Side to Sit: with supervision PT Goal: Supine/Side to Sit - Progress: Progressing toward goal Pt will go Sit to Supine/Side: with supervision PT Goal: Sit to Supine/Side - Progress: Progressing toward goal Pt will go Sit to Stand: with supervision PT Goal: Sit to Stand - Progress: Progressing toward goal Pt will go Stand to Sit: with supervision PT Goal: Stand to Sit - Progress: Progressing toward goal Pt will Ambulate: 51 - 150 feet;with supervision;with rolling walker PT Goal: Ambulate - Progress: Progressing toward goal  Visit Information  Last PT Received On: 12/17/12 Assistance Needed: +2    Subjective Data  Subjective: I've been nauseous all morning but I want to try Patient Stated Goal: Rehab and home   Cognition  Cognition Arousal/Alertness: Awake/alert Behavior During Therapy: WFL for tasks assessed/performed Overall Cognitive Status: Within Functional Limits for tasks assessed    Balance     End of Session PT - End of Session Equipment Utilized During Treatment: Gait belt;Left knee immobilizer Activity Tolerance: Patient limited by pain;Other (comment) (nausea) Patient left: in chair;with call bell/phone within reach;with family/visitor present Nurse Communication: Mobility status   GP     Vineta Carone 12/17/2012, 12:51 PM

## 2012-12-17 NOTE — Progress Notes (Signed)
Patient ID: Darrell Perez, male   DOB: 13-May-1941, 72 y.o.   MRN: [id number] Subjective: 1 Day Post-Op Procedure(s) (LRB): LEFT TOTAL KNEE ARTHROPLASTY (Left) Patient reports pain as mild.    Patient has complaints of left knee pain, well controlled. He did have trouble sleeping overnight due to pain but it is improved today. Nauseous yesterday when sitting at bedside, improved today.  We will start therapy today. Plan is to go to [facility] after hospital stay.  Objective: Vital signs in last 24 hours: Temp:  [97.3 F (36.3 C)-98.6 F (37 C)] 97.3 F (36.3 C) (06/20 0606) Pulse Rate:  [42-63] 54 (06/20 0606) Resp:  [14-18] 14 (06/20 0606) BP: (134-166)/(58-81) 137/76 mmHg (06/20 0606) SpO2:  [96 %-100 %] 99 % (06/20 0606) Weight:  [95.165 kg (209 lb 12.8 oz)] 95.165 kg (209 lb 12.8 oz) (06/19 1100)  Intake/Output from previous day:  Intake/Output Summary (Last 24 hours) at 12/17/12 0729 Last data filed at 12/17/12 0700  Gross per 24 hour  Intake   4950 ml  Output   2220 ml  Net   2730 ml    Intake/Output this shift:    Labs: Results for orders placed during the hospital encounter of 12/16/12  CBC      Result Value Range   WBC 9.5  4.0 - 10.5 K/uL   RBC 4.56  4.22 - 5.81 MIL/uL   Hemoglobin 11.7 (*) 13.0 - 17.0 g/dL   HCT 40.9 (*) 81.1 - 91.4 %   MCV 81.4  78.0 - 100.0 fL   MCH 25.7 (*) 26.0 - 34.0 pg   MCHC 31.5  30.0 - 36.0 g/dL   RDW 78.2 (*) 95.6 - 21.3 %   Platelets 219  150 - 400 K/uL  BASIC METABOLIC PANEL      Result Value Range   Sodium 134 (*) 135 - 145 mEq/L   Potassium 3.0 (*) 3.5 - 5.1 mEq/L   Chloride 97  96 - 112 mEq/L   CO2 32  19 - 32 mEq/L   Glucose, Bld 103 (*) 70 - 99 mg/dL   BUN 14  6 - 23 mg/dL   Creatinine, Ser 0.86  0.50 - 1.35 mg/dL   Calcium 8.1 (*) 8.4 - 10.5 mg/dL   GFR calc non Af Amer 83 (*) >90 mL/min   GFR calc Af Amer >90  >90 mL/min    Exam - Neurologically intact ABD soft Neurovascular intact Sensation intact  distally Intact pulses distally Dorsiflexion/Plantar flexion intact Incision: dressing C/D/I and no drainage No cellulitis present Compartment soft Dressing - clean, dry, no drainage Motor function intact - moving foot and toes well on exam.  No calf pain or sign of DVT Hemovac pulled without difficulty.  Assessment/Plan: 1 Day Post-Op Procedure(s) (LRB): LEFT TOTAL KNEE ARTHROPLASTY (Left)  Advance diet Up with therapy Past Medical History  Diagnosis Date  . Hypertension   . Asthma     15-20 yrs ago  . H/O hiatal hernia   . History of kidney stones   . Left ureteral calculus   . GERD (gastroesophageal reflux disease)     NO ISSUES WHEN USING CPAP--  NO MEDS  . Frequency of urination   . Urgency of urination   . Nocturia   . Dry skin   . Arthritis   . Left knee DJD   . OSA on CPAP     cpap does not know settings   . URI (upper respiratory infection) 11/29/12  see office visit note in chart placed on Zpack    DVT Prophylaxis - Xarelto Protocol Weight-Bearing as tolerated to left leg Keep foley until tomorrow. No vaccines. Hypokalemia despite IVF with KCl and K-Dur, 3.0 this AM, was 3.4 pre-admission. He was hypokalemic last admission for R TKA as well. Consider increased dosage at D/C. Will monitor. Plan for D/C Sunday to [facility] Will change drain site dressing tomorrow Will discuss with Dr. Elissa Lovett, Dayna Barker. 12/17/2012, 7:29 AM

## 2012-12-17 NOTE — Discharge Summary (Signed)
Physician Discharge Summary   Patient ID: Darrell Perez MRN: [id number] DOB/AGE: 01/28/1941 72 y.o.  Admit date: 12/16/2012 Discharge date: planned for 12/19/12 or 12/20/12  Primary Diagnosis: left knee DJD  Admission Diagnoses:  Past Medical History  Diagnosis Date  . Hypertension   . Asthma     15-20 yrs ago  . H/O hiatal hernia   . History of kidney stones   . Left ureteral calculus   . GERD (gastroesophageal reflux disease)     NO ISSUES WHEN USING CPAP--  NO MEDS  . Frequency of urination   . Urgency of urination   . Nocturia   . Dry skin   . Arthritis   . Left knee DJD   . OSA on CPAP     cpap does not know settings   . URI (upper respiratory infection) 11/29/12     see office visit note in chart placed on Zpack   Discharge Diagnoses:   Principal Problem:   Left knee DJD  Estimated body mass index is 30.1 kg/(m^2) as calculated from the following:   Height as of this encounter: 5\' 10"  (1.778 m).   Weight as of this encounter: 95.165 kg (209 lb 12.8 oz).  Procedure:  Procedure(s) (LRB): LEFT TOTAL KNEE ARTHROPLASTY (Left)   Consults: None  HPI: see H&P Laboratory Data: Admission on 12/16/2012  Component Date Value Range Status  . WBC 12/17/2012 9.5  4.0 - 10.5 K/uL Final  . RBC 12/17/2012 4.56  4.22 - 5.81 MIL/uL Final  . Hemoglobin 12/17/2012 11.7* 13.0 - 17.0 g/dL Final  . HCT 44/08/4740 37.1* 39.0 - 52.0 % Final  . MCV 12/17/2012 81.4  78.0 - 100.0 fL Final  . MCH 12/17/2012 25.7* 26.0 - 34.0 pg Final  . MCHC 12/17/2012 31.5  30.0 - 36.0 g/dL Final  . RDW 59/56/3875 15.8* 11.5 - 15.5 % Final  . Platelets 12/17/2012 219  150 - 400 K/uL Final  . Sodium 12/17/2012 134* 135 - 145 mEq/L Final  . Potassium 12/17/2012 3.0* 3.5 - 5.1 mEq/L Final  . Chloride 12/17/2012 97  96 - 112 mEq/L Final  . CO2 12/17/2012 32  19 - 32 mEq/L Final  . Glucose, Bld 12/17/2012 103* 70 - 99 mg/dL Final  . BUN 64/33/2951 14  6 - 23 mg/dL Final  . Creatinine, Ser 12/17/2012  0.91  0.50 - 1.35 mg/dL Final  . Calcium 88/41/6606 8.1* 8.4 - 10.5 mg/dL Final  . GFR calc non Af Amer 12/17/2012 83* >90 mL/min Final  . GFR calc Af Amer 12/17/2012 >90  >90 mL/min Final   Comment:                                 The eGFR has been calculated                          using the CKD EPI equation.                          This calculation has not been                          validated in all clinical                          situations.  eGFR's persistently                          <90 mL/min signify                          possible Chronic Kidney Disease.  Hospital Outpatient Visit on 12/08/2012  Component Date Value Range Status  . WBC 12/08/2012 8.5  4.0 - 10.5 K/uL Final  . RBC 12/08/2012 5.12  4.22 - 5.81 MIL/uL Final  . Hemoglobin 12/08/2012 13.4  13.0 - 17.0 g/dL Final  . HCT 16/03/9603 41.5  39.0 - 52.0 % Final  . MCV 12/08/2012 81.1  78.0 - 100.0 fL Final  . MCH 12/08/2012 26.2  26.0 - 34.0 pg Final  . MCHC 12/08/2012 32.3  30.0 - 36.0 g/dL Final  . RDW 54/02/8118 15.5  11.5 - 15.5 % Final  . Platelets 12/08/2012 317  150 - 400 K/uL Final  . Sodium 12/08/2012 141  135 - 145 mEq/L Final  . Potassium 12/08/2012 3.4* 3.5 - 5.1 mEq/L Final  . Chloride 12/08/2012 101  96 - 112 mEq/L Final  . CO2 12/08/2012 33* 19 - 32 mEq/L Final  . Glucose, Bld 12/08/2012 92  70 - 99 mg/dL Final  . BUN 14/78/2956 22  6 - 23 mg/dL Final  . Creatinine, Ser 12/08/2012 1.06  0.50 - 1.35 mg/dL Final  . Calcium 21/30/8657 9.3  8.4 - 10.5 mg/dL Final  . Total Protein 12/08/2012 6.8  6.0 - 8.3 g/dL Final  . Albumin 84/69/6295 3.2* 3.5 - 5.2 g/dL Final  . AST 28/41/3244 24  0 - 37 U/L Final  . ALT 12/08/2012 22  0 - 53 U/L Final  . Alkaline Phosphatase 12/08/2012 71  39 - 117 U/L Final  . Total Bilirubin 12/08/2012 0.2* 0.3 - 1.2 mg/dL Final  . GFR calc non Af Amer 12/08/2012 68* >90 mL/min Final  . GFR calc Af Amer 12/08/2012 79* >90 mL/min Final    Comment:                                 The eGFR has been calculated                          using the CKD EPI equation.                          This calculation has not been                          validated in all clinical                          situations.                          eGFR's persistently                          <90 mL/min signify                          possible Chronic Kidney Disease.  [name] Prothrombin Time 12/08/2012 12.1  11.6 - 15.2 seconds Final  .  INR 12/08/2012 0.90  0.00 - 1.49 Final  . ABO/RH(D) 12/08/2012 O POS   Final  . Antibody Screen 12/08/2012 NEG   Final  . Sample Expiration 12/08/2012 12/19/2012   Final  . Color, Urine 12/08/2012 YELLOW  YELLOW Final  . APPearance 12/08/2012 CLEAR  CLEAR Final  . Specific Gravity, Urine 12/08/2012 1.025  1.005 - 1.030 Final  . pH 12/08/2012 5.0  5.0 - 8.0 Final  . Glucose, UA 12/08/2012 NEGATIVE  NEGATIVE mg/dL Final  . Hgb urine dipstick 12/08/2012 NEGATIVE  NEGATIVE Final  . Bilirubin Urine 12/08/2012 NEGATIVE  NEGATIVE Final  . Ketones, ur 12/08/2012 NEGATIVE  NEGATIVE mg/dL Final  . Protein, ur 69/62/9528 NEGATIVE  NEGATIVE mg/dL Final  . Urobilinogen, UA 12/08/2012 0.2  0.0 - 1.0 mg/dL Final  . Nitrite 41/32/4401 NEGATIVE  NEGATIVE Final  . Leukocytes, UA 12/08/2012 NEGATIVE  NEGATIVE Final   MICROSCOPIC NOT DONE ON URINES WITH NEGATIVE PROTEIN, BLOOD, LEUKOCYTES, NITRITE, OR GLUCOSE <1000 mg/dL.  [name] MRSA, PCR 12/08/2012 NEGATIVE  NEGATIVE Final  . Staphylococcus aureus 12/08/2012 NEGATIVE  NEGATIVE Final   Comment:                                 The Xpert SA Assay (FDA                          approved for NASAL specimens                          in patients over 74 years of age),                          is one component of                          a comprehensive surveillance                          program.  Test performance has                          been validated by [employer] for patients greater                          than or equal to 37 year old.                          It is not intended                          to diagnose infection nor to                          guide or monitor treatment.  [name] aPTT 12/08/2012 34  24 - 37 seconds Final  Admission on 11/23/2012, Discharged on 11/23/2012  Component Date Value Range Status  . Sodium 11/23/2012 142  135 - 145 mEq/L Final  . Potassium 11/23/2012 3.4* 3.5 - 5.1 mEq/L Final  .  Glucose, Bld 11/23/2012 110* 70 - 99 mg/dL Final  . HCT 82/95/6213 45.0  39.0 - 52.0 % Final  . Hemoglobin 11/23/2012 15.3  13.0 - 17.0 g/dL Final     X-Rays:Dg Chest 2 View  12/08/2012   *RADIOLOGY REPORT*  Clinical Data: Hypertension.  Preop radiograph  CHEST - 2 VIEW  Comparison: 10/02/2011  Findings: The heart size and mediastinal contours are within normal limits.  Both lungs are clear. Previous right shoulder arthroplasty.  Spondylosis noted within the thoracic spine.  IMPRESSION: No acute cardiopulmonary abnormalities.   Original Report Authenticated By: Signa Kell, M.D.   Dg Knee 1-2 Views Left  12/08/2012   *RADIOLOGY REPORT*  Clinical Data: Preoperative evaluation for left total knee arthroplasty, anterior knee pain, degenerative joint disease  LEFT KNEE - 1-2 VIEW  Comparison: None  Findings: Joint space narrowing greatest at medial compartment. Small patellar spurs. Corticated ossific density identified adjacent to the medial margin of the medial tibial plateau, could be result of an old MCL avulsion. No acute fracture, dislocation or bone destruction. No knee joint effusion.  IMPRESSION: Mild degenerative changes left knee. Cannot exclude old medial collateral ligament avulsion from the medial margin of the proximal tibia.   Original Report Authenticated By: Ulyses Southward, M.D.   Dg Knee Left Port  12/16/2012   *RADIOLOGY REPORT*  Clinical Data: Postop knee replacement  PORTABLE LEFT KNEE - 1-2 VIEW  Comparison: 12/08/2012   Findings: Total knee replacement has been performed with satisfactory position and alignment.  No immediate complication.  IMPRESSION: Satisfactory knee replacement on the left.   Original Report Authenticated By: Janeece Riggers, M.D.    EKG: Orders placed during the hospital encounter of 12/08/12  . EKG 12-LEAD  . EKG 12-LEAD     Hospital Course: Darrell Perez is a 72 y.o. who was admitted to [facility]. They were brought to the operating room on 12/16/2012 and underwent Procedure(s): LEFT TOTAL KNEE ARTHROPLASTY.  Patient tolerated the procedure well and was later transferred to the recovery room and then to the orthopaedic floor for postoperative care.  They were given PO and IV analgesics for pain control following their surgery.  They were given 24 hours of postoperative antibiotics of  Anti-infectives   Start     Dose/Rate Route Frequency Ordered Stop   12/16/12 1330  ceFAZolin (ANCEF) IVPB 2 g/50 mL premix     2 g 100 mL/hr over 30 Minutes Intravenous Every 6 hours 12/16/12 1118 12/16/12 2059   12/16/12 0816  polymyxin B 500,000 Units, bacitracin 50,000 Units in sodium chloride irrigation 0.9 % 500 mL irrigation  Status:  Discontinued       As needed 12/16/12 0816 12/16/12 0944   12/16/12 0604  ceFAZolin (ANCEF) IVPB 2 g/50 mL premix     2 g 100 mL/hr over 30 Minutes Intravenous On call to O.R. 12/16/12 0865 12/16/12 7846     and started on DVT prophylaxis in the form of Xarelto, TED hose, and SCD's.   PT and OT were ordered for total joint protocol.  Discharge planning consulted to help with postop disposition and equipment needs.  Patient had a fair night on the evening of surgery.  They started to get up OOB with therapy on day one. Hemovac drain was pulled without difficulty.  Plan continued PT, change drain site dressing POD#2. Will monitor hypokalemia. Plan for D/C Sunday 6/22 to [facility].  Discharge Medications: Prior to Admission medications   Medication Sig Start  Date End Date Taking? Authorizing Provider  allopurinol (ZYLOPRIM) 100 MG tablet Take 100 mg by mouth daily at 12 noon.    Yes Historical Provider, MD  cetirizine (ZYRTEC) 10 MG tablet Take 10 mg by mouth daily. [facility] version of Zyrtec   Yes Historical Provider, MD  cholecalciferol (VITAMIN D) 1000 UNITS tablet Take 1,000 Units by mouth daily.   Yes Historical Provider, MD  hydrocortisone cream 1 % Apply 1 application topically 2 (two) times daily as needed. For itching   Yes Historical Provider, MD  indapamide (LOZOL) 2.5 MG tablet Take 2.5 mg by mouth every evening.    Yes Historical Provider, MD  pantoprazole (PROTONIX) 40 MG tablet Take 40 mg by mouth as needed.   Yes Historical Provider, MD  pindolol (VISKEN) 5 MG tablet Take 5 mg by mouth every evening.    Yes Historical Provider, MD  potassium chloride SA (K-DUR,KLOR-CON) 20 MEQ tablet Take 20 mEq by mouth 2 (two) times daily.   Yes Historical Provider, MD  Tamsulosin HCl (FLOMAX) 0.4 MG CAPS Take 0.4 mg by mouth daily.   Yes Historical Provider, MD  acetaminophen (TYLENOL) 500 MG tablet Take 500 mg by mouth every 6 (six) hours as needed for pain.    Historical Provider, MD  amoxicillin (AMOXIL) 500 MG capsule Take 2,000 mg by mouth daily as needed. For dental procedure    Historical Provider, MD  aspirin EC 81 MG tablet Take 81 mg by mouth daily.    Historical Provider, MD  azithromycin (ZITHROMAX) 250 MG tablet Take 250 mg by mouth daily. Started on 11/29/12  Completed on 12/03/12.    Historical Provider, MD  HYDROcodone-acetaminophen (NORCO/VICODIN) 5-325 MG per tablet Take 1-2 tablets by mouth every 6 (six) hours as needed for pain. 07/22/12   Dayna Barker. Arletha Marschke, PA-C  methocarbamol (ROBAXIN) 500 MG tablet Take 500 mg by mouth daily as needed (for muscle spasm).  07/22/12   Dayna Barker. Carle Dargan, PA-C  oxyCODONE-acetaminophen (ROXICET) 5-325 MG per tablet Take 1-2 tablets by mouth every 4 (four) hours as needed for pain. 12/16/12   Dayna Barker. Kathlean Cinco,  PA-C  rivaroxaban (XARELTO) 10 MG TABS tablet Take 1 tablet (10 mg total) by mouth daily. 12/16/12   Dayna Barker. Allahna Husband, PA-C  traMADol (ULTRAM) 50 MG tablet Take 1-2 tablets (50-100 mg total) by mouth every 6 (six) hours as needed for pain. 12/16/12   Dayna Barker. Christene Lye, PA-C    Diet: Regular diet Activity:WBAT Follow-up:in 10-14 days Disposition - [facility] Discharged Condition: good      Medication List    TAKE these medications       oxyCODONE-acetaminophen 5-325 MG per tablet  Commonly known as:  ROXICET  Take 1-2 tablets by mouth every 4 (four) hours as needed for pain.     rivaroxaban 10 MG Tabs tablet  Commonly known as:  XARELTO  Take 1 tablet (10 mg total) by mouth daily.     traMADol 50 MG tablet  Commonly known as:  ULTRAM  Take 1-2 tablets (50-100 mg total) by mouth every 6 (six) hours as needed for pain.      ASK your doctor about these medications       acetaminophen 500 MG tablet  Commonly known as:  TYLENOL  Take 500 mg by mouth every 6 (six) hours as needed for pain.     allopurinol 100 MG tablet  Commonly known as:  ZYLOPRIM  Take 100 mg by mouth daily at 12 noon.  amoxicillin 500 MG capsule  Commonly known as:  AMOXIL  Take 2,000 mg by mouth daily as needed. For dental procedure     aspirin EC 81 MG tablet  Take 81 mg by mouth daily.     azithromycin 250 MG tablet  Commonly known as:  ZITHROMAX  Take 250 mg by mouth daily. Started on 11/29/12  Completed on 12/03/12.     cetirizine 10 MG tablet  Commonly known as:  ZYRTEC  Take 10 mg by mouth daily. [facility] version of Zyrtec     cholecalciferol 1000 UNITS tablet  Commonly known as:  VITAMIN D  Take 1,000 Units by mouth daily.     HYDROcodone-acetaminophen 5-325 MG per tablet  Commonly known as:  NORCO/VICODIN  Take 1-2 tablets by mouth every 6 (six) hours as needed for pain.     hydrocortisone cream 1 %  Apply 1 application topically 2 (two) times daily as needed. For itching      indapamide 2.5 MG tablet  Commonly known as:  LOZOL  Take 2.5 mg by mouth every evening.     methocarbamol 500 MG tablet  Commonly known as:  ROBAXIN  Take 500 mg by mouth daily as needed (for muscle spasm).     pantoprazole 40 MG tablet  Commonly known as:  PROTONIX  Take 40 mg by mouth as needed.     pindolol 5 MG tablet  Commonly known as:  VISKEN  Take 5 mg by mouth every evening.     potassium chloride SA 20 MEQ tablet  Commonly known as:  K-DUR,KLOR-CON  Take 20 mEq by mouth 2 (two) times daily.     tamsulosin 0.4 MG Caps  Commonly known as:  FLOMAX  Take 0.4 mg by mouth daily.           Follow-up Information   Follow up with BEANE,JEFFREY C, MD In 2 weeks.   Contact information:   [address] [place] [address] [phone]       Signed: Dorothy Spark. 12/17/2012, 8:06 AM

## 2012-12-17 NOTE — Progress Notes (Signed)
CSW assisting with d/c planning. [employer] has provided prior authorization for SNF placement at [facility] for 6/21 or 6/22. Week end CSW will assist with d/c planning to SNF. D/C Summary has been sent to [facility] in preporation of pending d/c.  Cori Razor LCSW [phone]

## 2012-12-17 NOTE — Evaluation (Signed)
Occupational Therapy Evaluation Patient Details Name: Darrell Perez MRN: [id number] DOB: [birth year] Today's Date: 12/17/2012 Time: 4098-1191 OT Time Calculation (min): 27 min  OT Assessment / Plan / Recommendation Clinical Impression  Pt is s/p L TKA and currently is limited by pain and nausea. Feel he will do well once nausea improves. Will benefit from skilled OT services to improve ADL independence for next venue of care.     OT Assessment  Patient needs continued OT Services    Follow Up Recommendations  SNF;Supervision/Assistance - 24 hour    Barriers to Discharge      Equipment Recommendations  None recommended by OT    Recommendations for Other Services    Frequency  Min 2X/week    Precautions / Restrictions Precautions Precautions: Knee;Fall Required Braces or Orthoses: Knee Immobilizer - Left Knee Immobilizer - Left: Discontinue once straight leg raise with < 10 degree lag Restrictions Weight Bearing Restrictions: No Other Position/Activity Restrictions: WBAT        ADL  Eating/Feeding: Independent Where Assessed - Eating/Feeding: Chair Grooming: Wash/dry face;Set up Where Assessed - Grooming: Supported sitting Upper Body Bathing: Chest;Right arm;Left arm;Abdomen;Set up;Supervision/safety Where Assessed - Upper Body Bathing: Unsupported sitting Lower Body Bathing: +2 Total assistance Lower Body Bathing: Patient Percentage: 50% Where Assessed - Lower Body Bathing: Supported sit to stand Upper Body Dressing: Set up;Supervision/safety Where Assessed - Upper Body Dressing: Unsupported sitting Lower Body Dressing: +2 Total assistance Lower Body Dressing: Patient Percentage: 30% Where Assessed - Lower Body Dressing: Supported sit to stand Toilet Transfer: +2 Total assistance Toilet Transfer: Patient Percentage: 60% Toilet Transfer Method: Stand pivot Toileting - Clothing Manipulation and Hygiene: +2 Total assistance Toileting - Clothing Manipulation and  Hygiene: Patient Percentage: 50% Where Assessed - Toileting Clothing Manipulation and Hygiene: Sit to stand from 3-in-1 or toilet Equipment Used: Rolling walker ADL Comments: Limited session due to pt feeling nauseous. Assisted back to bed. Note blood on ace bandage. Nursing notified. Pt not able to tolerate much this session due to nausea.    OT Diagnosis: Generalized weakness;Acute pain  OT Problem List: Decreased strength;Decreased activity tolerance;Pain OT Treatment Interventions: Self-care/ADL training;Therapeutic activities;DME and/or AE instruction;Patient/family education   OT Goals Acute Rehab OT Goals OT Goal Formulation: With patient Time For Goal Achievement: 12/24/12 Potential to Achieve Goals: Good ADL Goals Pt Will Perform Grooming: with min assist;Standing at sink ADL Goal: Grooming - Progress: Goal set today Pt Will Perform Lower Body Dressing: with min assist;Sit to stand from chair;Sit to stand from bed;with adaptive equipment ADL Goal: Lower Body Dressing - Progress: Goal set today Pt Will Transfer to Toilet: with min assist;Ambulation;3-in-1 ADL Goal: Toilet Transfer - Progress: Goal set today Pt Will Perform Toileting - Clothing Manipulation: with min assist;Standing ADL Goal: Toileting - Clothing Manipulation - Progress: Goal set today  Visit Information  Last OT Received On: 12/17/12 Assistance Needed: +2    Subjective Data  Subjective: I feel nauseous Patient Stated Goal: wanted to return to bed   Prior Functioning     Home Living Lives With: Spouse Type of Home: Skilled Nursing Facility Prior Function Level of Independence: Independent Able to Take Stairs?: Yes Driving: Yes Communication Communication: No difficulties         Vision/Perception     Cognition  Cognition Arousal/Alertness: Awake/alert Behavior During Therapy: WFL for tasks assessed/performed Overall Cognitive Status: Within Functional Limits for tasks assessed     Extremity/Trunk Assessment Right Upper Extremity Assessment RUE ROM/Strength/Tone: [facility] for tasks assessed Left Upper  Extremity Assessment LUE ROM/Strength/Tone: [facility] for tasks assessed     Mobility  Sit to Supine: 1: +2 Total assist Sit to Supine: Patient Percentage: 50% Details for Bed Mobility Assistance: assist for LEs up onto bed. +2 for safety as pt nauseous.  Transfers Transfers: Sit to Stand;Stand to Sit Sit to Stand: 1: +2 Total assist;From chair/3-in-1;With upper extremity assist Sit to Stand: Patient Percentage: 60% Stand to Sit: 1: +2 Total assist;To bed Stand to Sit: Patient Percentage: 60% Details for Transfer Assistance: verbal cues for hand placement and L LE out in front        Balance     End of Session OT - End of Session Activity Tolerance: Other (comment) (limited by nausea) Patient left: in bed;with call bell/phone within reach  GO     Lennox Laity [phone] 12/17/2012, 1:47 PM

## 2012-12-17 NOTE — Care Management Note (Signed)
    Page 1 of 1   12/17/2012     3:04:32 PM   CARE MANAGEMENT NOTE 12/17/2012  Patient:  MISAEL, MCGAHA   Account Number:  [id number]  Date Initiated:  12/17/2012  Documentation initiated by:  Colleen Can  Subjective/Objective Assessment:   dx DJD left knee; total knee replacemnt     Action/Plan:   SNF rehab   Anticipated DC Date:  12/18/2012   Anticipated DC Plan:  SKILLED NURSING FACILITY  In-house referral  Clinical Social Worker      DC Planning Services  CM consult      Choice offered to / List presented to:             Status of service:  Completed, signed off Medicare Important Message given?  NA - LOS <3 / Initial given by admissions (If response is "NO", the following Medicare IM given date fields will be blank) Date Medicare IM given:   Date Additional Medicare IM given:    Discharge Disposition:    Per UR Regulation:    If discussed at Long Length of Stay Meetings, dates discussed:    Comments:

## 2012-12-18 LAB — CBC
HCT: 39.4 % (ref 39.0–52.0)
MCHC: 32.2 g/dL (ref 30.0–36.0)
RDW: 15.7 % — ABNORMAL HIGH (ref 11.5–15.5)

## 2012-12-18 LAB — BASIC METABOLIC PANEL
BUN: 12 mg/dL (ref 6–23)
GFR calc Af Amer: >90 mL/min (ref >90)
GFR calc non Af Amer: 82 mL/min — ABNORMAL LOW (ref >90)
Potassium: 3.1 mEq/L — ABNORMAL LOW (ref 3.5–5.1)

## 2012-12-18 NOTE — Progress Notes (Signed)
Per MD, Pt not ready for d/c today.  Possible d/c tomorrow.  Notified RN and facility.  RN to notify Pt and wife.  Facility ok to receive Pt tomorrow.  Providence Crosby, LCSWA Clinical Social Work [phone]

## 2012-12-18 NOTE — Progress Notes (Signed)
Patient ID: Darrell Perez, male   DOB: Mar 27, 1941, 72 y.o.   MRN: [id number] Subjective: 2 Days Post-Op Procedure(s) (LRB): LEFT TOTAL KNEE ARTHROPLASTY (Left) Patient reports pain as mild.    Patient has complaints of left knee pain, well controlled.  Objective: Vital signs in last 24 hours: Temp:  [98 F (36.7 C)-99.6 F (37.6 C)] 98.7 F (37.1 C) (06/21 0617) Pulse Rate:  [60-70] 68 (06/21 0617) Resp:  [14-16] 16 (06/21 0617) BP: (128-158)/(66-72) 158/72 mmHg (06/21 0617) SpO2:  [88 %-98 %] 92 % (06/21 0617)  Intake/Output from previous day:  Intake/Output Summary (Last 24 hours) at 12/18/12 0804 Last data filed at 12/18/12 0725  Gross per 24 hour  Intake 1299.83 ml  Output   2400 ml  Net -1100.17 ml    Intake/Output this shift: Total I/O In: -  Out: 300 [Urine:300]  Labs: Results for orders placed during the hospital encounter of 12/16/12  CBC      Result Value Range   WBC 9.5  4.0 - 10.5 K/uL   RBC 4.56  4.22 - 5.81 MIL/uL   Hemoglobin 11.7 (*) 13.0 - 17.0 g/dL   HCT 62.1 (*) 30.8 - 65.7 %   MCV 81.4  78.0 - 100.0 fL   MCH 25.7 (*) 26.0 - 34.0 pg   MCHC 31.5  30.0 - 36.0 g/dL   RDW 84.6 (*) 96.2 - 95.2 %   Platelets 219  150 - 400 K/uL  BASIC METABOLIC PANEL      Result Value Range   Sodium 134 (*) 135 - 145 mEq/L   Potassium 3.0 (*) 3.5 - 5.1 mEq/L   Chloride 97  96 - 112 mEq/L   CO2 32  19 - 32 mEq/L   Glucose, Bld 103 (*) 70 - 99 mg/dL   BUN 14  6 - 23 mg/dL   Creatinine, Ser 8.41  0.50 - 1.35 mg/dL   Calcium 8.1 (*) 8.4 - 10.5 mg/dL   GFR calc non Af Amer 83 (*) >90 mL/min   GFR calc Af Amer >90  >90 mL/min  CBC      Result Value Range   WBC 11.5 (*) 4.0 - 10.5 K/uL   RBC 4.92  4.22 - 5.81 MIL/uL   Hemoglobin 12.7 (*) 13.0 - 17.0 g/dL   HCT 32.4  40.1 - 02.7 %   MCV 80.1  78.0 - 100.0 fL   MCH 25.8 (*) 26.0 - 34.0 pg   MCHC 32.2  30.0 - 36.0 g/dL   RDW 25.3 (*) 66.4 - 40.3 %   Platelets 245  150 - 400 K/uL    Exam - Neurologically  intact ABD soft Neurovascular intact Sensation intact distally Intact pulses distally Dorsiflexion/Plantar flexion intact Incision: dressing C/D/I and no drainage No cellulitis present Compartment soft No calf pain or sign of DVT Dressing/Incision - clean, dry, no drainage Motor function intact - moving foot and toes well on exam.  Drain site dressing changed today  Assessment/Plan: 2 Days Post-Op Procedure(s) (LRB): LEFT TOTAL KNEE ARTHROPLASTY (Left)  Advance diet Up with therapy Plan for discharge tomorrow to [facility] Past Medical History  Diagnosis Date  . Hypertension   . Asthma     15-20 yrs ago  . H/O hiatal hernia   . History of kidney stones   . Left ureteral calculus   . GERD (gastroesophageal reflux disease)     NO ISSUES WHEN USING CPAP--  NO MEDS  . Frequency of urination   .  Urgency of urination   . Nocturia   . Dry skin   . Arthritis   . Left knee DJD   . OSA on CPAP     cpap does not know settings   . URI (upper respiratory infection) 11/29/12     see office visit note in chart placed on Zpack    DVT Prophylaxis - Xarelto Protocol Weight-Bearing as tolerated to left leg Hypokalemic yesterday- ordered repeat BMet for this AM to recheck Plan D/C tomorrow to [facility]- D/C summary in chart  Darrell Perez M. 12/18/2012, 8:04 AM

## 2012-12-18 NOTE — Progress Notes (Signed)
Physical Therapy Treatment Patient Details Name: Darrell Perez MRN: [id number] DOB: 01-Jul-1940 Today's Date: 12/18/2012 Time: 9562-1308 PT Time Calculation (min): 43 min  PT Assessment / Plan / Recommendation Comments on Treatment Session       Follow Up Recommendations  SNF     Does the patient have the potential to tolerate intense rehabilitation     Barriers to Discharge        Equipment Recommendations  None recommended by PT    Recommendations for Other Services OT consult  Frequency 7X/week   Plan Discharge plan remains appropriate    Precautions / Restrictions Precautions Precautions: Knee;Fall Required Braces or Orthoses: Knee Immobilizer - Left Knee Immobilizer - Left: Discontinue once straight leg raise with < 10 degree lag Restrictions Weight Bearing Restrictions: No Other Position/Activity Restrictions: WBAT   Pertinent Vitals/Pain 4-5/10; premed, cold packs provided    Mobility  Bed Mobility Bed Mobility: Supine to Sit Supine to Sit: 3: Mod assist Details for Bed Mobility Assistance: assist for LEs up onto bed. +2 for safety as pt nauseous.  Transfers Transfers: Sit to Stand;Stand to Sit Sit to Stand: 3: Mod assist Stand to Sit: 3: Mod assist Details for Transfer Assistance: verbal cues for hand placement and L LE out in front Ambulation/Gait Ambulation/Gait Assistance: 3: Mod assist;4: Min assist Ambulation Distance (Feet): 66 Feet Assistive device: Rolling walker Ambulation/Gait Assistance Details: cues for posture, sequence, stride length, position from RW Gait Pattern: Step-to pattern;Decreased stride length;Decreased stance time - left Gait velocity: slow Stairs: No    Exercises Total Joint Exercises Ankle Circles/Pumps: AROM;Supine;Both;15 reps Quad Sets: AROM;Supine;Both;15 reps Heel Slides: AAROM;Left;Supine;15 reps Straight Leg Raises: AAROM;Left;Supine;20 reps   PT Diagnosis:    PT Problem List:   PT Treatment Interventions:      PT Goals Acute Rehab PT Goals PT Goal Formulation: With patient Time For Goal Achievement: 12/16/12 Potential to Achieve Goals: Good Pt will go Supine/Side to Sit: with supervision PT Goal: Supine/Side to Sit - Progress: Progressing toward goal Pt will go Sit to Supine/Side: with supervision PT Goal: Sit to Supine/Side - Progress: Progressing toward goal Pt will go Sit to Stand: with supervision PT Goal: Sit to Stand - Progress: Progressing toward goal Pt will go Stand to Sit: with supervision PT Goal: Stand to Sit - Progress: Progressing toward goal Pt will Ambulate: 51 - 150 feet;with supervision;with rolling walker PT Goal: Ambulate - Progress: Progressing toward goal  Visit Information  Last PT Received On: 12/18/12 Assistance Needed: +1    Subjective Data  Subjective: No nausea today  Patient Stated Goal: Rehab and home   Cognition  Cognition Arousal/Alertness: Awake/alert Behavior During Therapy: WFL for tasks assessed/performed Overall Cognitive Status: Within Functional Limits for tasks assessed    Balance     End of Session PT - End of Session Equipment Utilized During Treatment: Gait belt;Left knee immobilizer Activity Tolerance: Patient tolerated treatment well Patient left: in chair;with call bell/phone within reach;with family/visitor present Nurse Communication: Mobility status   GP     Darrell Perez 12/18/2012, 12:22 PM

## 2012-12-18 NOTE — Progress Notes (Signed)
Physical Therapy Treatment Patient Details Name: Darrell Perez MRN: [id number] DOB: Jan 04, 1941 Today's Date: 12/18/2012 Time: 7829-5621 PT Time Calculation (min): 28 min  PT Assessment / Plan / Recommendation Comments on Treatment Session       Follow Up Recommendations  SNF     Does the patient have the potential to tolerate intense rehabilitation     Barriers to Discharge        Equipment Recommendations  None recommended by PT    Recommendations for Other Services OT consult  Frequency 7X/week   Plan Discharge plan remains appropriate    Precautions / Restrictions Precautions Precautions: Knee;Fall Required Braces or Orthoses: Knee Immobilizer - Left Knee Immobilizer - Left: Discontinue once straight leg raise with < 10 degree lag Restrictions Weight Bearing Restrictions: No Other Position/Activity Restrictions: WBAT   Pertinent Vitals/Pain 5/10; premed; ice packs provided    Mobility  Bed Mobility Bed Mobility: Supine to Sit;Sit to Supine Supine to Sit: 3: Mod assist Sit to Supine: 4: Min assist Details for Bed Mobility Assistance: cues for sequence and use of UEs and R LE to self assist Transfers Transfers: Sit to Stand;Stand to Sit Sit to Stand: 4: Min assist Stand to Sit: 4: Min assist Details for Transfer Assistance: verbal cues for hand placement and L LE out in front Ambulation/Gait Ambulation/Gait Assistance: 4: Min assist Ambulation Distance (Feet): 93 Feet Assistive device: Rolling walker Ambulation/Gait Assistance Details: cues for posture, position from RW, sequence, and stride length Gait Pattern: Step-to pattern;Decreased stride length;Decreased stance time - left Gait velocity: slow Stairs: No    Exercises     PT Diagnosis:    PT Problem List:   PT Treatment Interventions:     PT Goals Acute Rehab PT Goals PT Goal Formulation: With patient Time For Goal Achievement: 12/16/12 Potential to Achieve Goals: Good Pt will go Supine/Side to  Sit: with supervision PT Goal: Supine/Side to Sit - Progress: Progressing toward goal Pt will go Sit to Supine/Side: with supervision PT Goal: Sit to Supine/Side - Progress: Progressing toward goal Pt will go Sit to Stand: with supervision PT Goal: Sit to Stand - Progress: Progressing toward goal Pt will go Stand to Sit: with supervision PT Goal: Stand to Sit - Progress: Progressing toward goal Pt will Ambulate: 51 - 150 feet;with supervision;with rolling walker PT Goal: Ambulate - Progress: Progressing toward goal  Visit Information  Last PT Received On: 12/18/12 Assistance Needed: +1    Subjective Data  Subjective: What do we need to do this afternoon Patient Stated Goal: Rehab and home   Cognition  Cognition Arousal/Alertness: Awake/alert Behavior During Therapy: WFL for tasks assessed/performed Overall Cognitive Status: Within Functional Limits for tasks assessed    Balance     End of Session PT - End of Session Equipment Utilized During Treatment: Gait belt;Left knee immobilizer Activity Tolerance: Patient tolerated treatment well Patient left: with call bell/phone within reach;with family/visitor present;in bed Nurse Communication: Mobility status CPM Left Knee CPM Left Knee: On   GP     Elisha Cooksey 12/18/2012, 3:52 PM

## 2012-12-19 LAB — CBC
Hemoglobin: 11.8 g/dL — ABNORMAL LOW (ref 13.0–17.0)
MCHC: 32 g/dL (ref 30.0–36.0)
Platelets: 243 10*3/uL (ref 150–400)
RDW: 16.1 % — ABNORMAL HIGH (ref 11.5–15.5)

## 2012-12-19 NOTE — Progress Notes (Signed)
Pt stable...pt transported via [facility] to [facility].  All paperwork sent with pt and PTAR.

## 2012-12-19 NOTE — Progress Notes (Signed)
Physical Therapy Treatment Patient Details Name: Darrell Perez MRN: [id number] DOB: 09/10/1940 Today's Date: 12/19/2012 Time: 4098-1191 PT Time Calculation (min): 23 min  PT Assessment / Plan / Recommendation Comments on Treatment Session  Possibly for d/c to [facility] today. Pt somewhat drowsy this morning-did not sleep well per pt. Increased time to complete all tasks. Recommend SNF.     Follow Up Recommendations  SNF     Does the patient have the potential to tolerate intense rehabilitation     Barriers to Discharge        Equipment Recommendations  None recommended by PT    Recommendations for Other Services OT consult  Frequency 7X/week   Plan Discharge plan remains appropriate    Precautions / Restrictions Precautions Precautions: Knee;Fall Required Braces or Orthoses: Knee Immobilizer - Left Knee Immobilizer - Left: Discontinue once straight leg raise with < 10 degree lag Restrictions Weight Bearing Restrictions: No LLE Weight Bearing: Weight bearing as tolerated   Pertinent Vitals/Pain 5/10 L knee    Mobility  Bed Mobility Bed Mobility: Supine to Sit Supine to Sit: 4: Min assist;HOB elevated;With rails Details for Bed Mobility Assistance: Heavy reliance on rails. VCs safety, technique, hand placement. Assist of L LE off bed.  Transfers Transfers: Sit to Stand;Stand to Sit Sit to Stand: 4: Min assist;From bed;From elevated surface Stand to Sit: 4: Min guard;To bed Details for Transfer Assistance: VCs safety, technique, hand placement, L LE positioning.  Ambulation/Gait Ambulation/Gait Assistance: 4: Min guard Ambulation Distance (Feet): 115 Feet Assistive device: Rolling walker Ambulation/Gait Assistance Details: slow gait speed.  Gait Pattern: Antalgic;Decreased stance time - left;Decreased stride length;Decreased step length - left    Exercises Total Joint Exercises Ankle Circles/Pumps: AROM;Both;Supine;10 reps Quad Sets: AROM;Both;10 reps;Supine Heel  Slides: AAROM;Left;10 reps;Supine Hip ABduction/ADduction: AAROM;Left;10 reps;Supine Straight Leg Raises: AAROM;Left;10 reps;Supine   PT Diagnosis:    PT Problem List:   PT Treatment Interventions:     PT Goals Acute Rehab PT Goals Pt will go Supine/Side to Sit: with supervision PT Goal: Supine/Side to Sit - Progress: Progressing toward goal Pt will go Sit to Stand: with supervision PT Goal: Sit to Stand - Progress: Progressing toward goal Pt will go Stand to Sit: with supervision PT Goal: Stand to Sit - Progress: Progressing toward goal Pt will Ambulate: 51 - 150 feet;with supervision PT Goal: Ambulate - Progress: Progressing toward goal  Visit Information  Last PT Received On: 12/19/12 Assistance Needed: +1    Subjective Data  Subjective: I didn't sleep much last night Patient Stated Goal: [place] today   Cognition  Cognition Arousal/Alertness: Awake/alert Behavior During Therapy: WFL for tasks assessed/performed Overall Cognitive Status: Within Functional Limits for tasks assessed    Balance     End of Session PT - End of Session Equipment Utilized During Treatment: Left knee immobilizer Activity Tolerance: Patient tolerated treatment well Patient left: in bed;with call bell/phone within reach;with family/visitor present   GP     Rebeca Alert, MPT Pager: [phone]

## 2012-12-19 NOTE — Progress Notes (Signed)
Subjective: 3 Days Post-Op Procedure(s) (LRB): LEFT TOTAL KNEE ARTHROPLASTY (Left) Patient reports pain as 2 on 0-10 scale.Doing well today and is Discharged.    Objective: Vital signs in last 24 hours: Temp:  [98 F (36.7 C)-98.8 F (37.1 C)] 98 F (36.7 C) (06/22 0546) Pulse Rate:  [59-75] 59 (06/22 0546) Resp:  [16-18] 16 (06/22 0546) BP: (134-169)/(65-77) 169/77 mmHg (06/22 0546) SpO2:  [91 %-94 %] 94 % (06/22 0546)  Intake/Output from previous day: 06/21 0701 - 06/22 0700 In: 80 [I.V.:80] Out: 1675 [Urine:1675] Intake/Output this shift: Total I/O In: 360 [P.O.:360] Out: 200 [Urine:200]   Recent Labs  12/17/12 0446 12/18/12 0505 12/19/12 0423  HGB 11.7* 12.7* 11.8*    Recent Labs  12/18/12 0505 12/19/12 0423  WBC 11.5* 11.7*  RBC 4.92 4.57  HCT 39.4 36.9*  PLT 245 243    Recent Labs  12/17/12 0446 12/18/12 0630  NA 134* 134*  K 3.0* 3.1*  CL 97 95*  CO2 32 33*  BUN 14 12  CREATININE 0.91 0.93  GLUCOSE 103* 119*  CALCIUM 8.1* 8.7   No results found for this basename: LABPT, INR,  in the last 72 hours  Dorsiflexion/Plantar flexion intact  Assessment/Plan: 3 Days Post-Op Procedure(s) (LRB): LEFT TOTAL KNEE ARTHROPLASTY (Left) Discharge home with home health  Meredith Kilbride A 12/19/2012, 7:48 AM

## 2012-12-19 NOTE — Progress Notes (Signed)
Report called to Inetta Fermo, Charity fundraiser at [facility].

## 2012-12-19 NOTE — Progress Notes (Signed)
Per MD, Pt ready for d/c.  Notified Pt, family and facility.  Facility ready to receive Pt.  Arranged for transportation.  Providence Crosby, LCSWA Clinical Social Work [phone]

## 2012-12-20 NOTE — Progress Notes (Signed)
Clinical Social Work Department CLINICAL SOCIAL WORK PLACEMENT NOTE 12/20/2012  Patient:  WAHID, HOLLEY  Account Number:  [id number] Admit date:  12/16/2012  Clinical Social Worker:  Cori Razor, LCSW  Date/time:  12/16/2012 02:54 PM  Clinical Social Work is seeking post-discharge placement for this patient at the following level of care:   SKILLED NURSING   (*CSW will update this form in Epic as items are completed)     Patient/family provided with [facility] Department of Clinical Social Work's list of facilities offering this level of care within the geographic area requested by the patient (or if unable, by the patient's family).  12/16/2012  Patient/family informed of their freedom to choose among providers that offer the needed level of care, that participate in Medicare, Medicaid or managed care program needed by the patient, have an available bed and are willing to accept the patient.    Patient/family informed of [facility]' ownership interest in [facility], as well as of the fact that they are under no obligation to receive care at this facility.  PASARR submitted to EDS on  PASARR number received from E[employer] on 07/22/2012  FL2 transmitted to all facilities in geographic area requested by pt/family on  12/16/2012 FL2 transmitted to all facilities within larger geographic area on   Patient informed that his/her managed care company has contracts with or will negotiate with  certain facilities, including the following:     Patient/family informed of bed offers received:  12/16/2012 Patient chooses bed at [facility] Physician recommends and patient chooses bed at    Patient to be transferred to [facility] on  12/19/2012 Patient to be transferred to facility by EMS  The following physician request were entered in Epic:   Additional Comments: CSW alerted Molly Maduro at [facility] that pt d/c Sun not Sat as planned.  Cori Razor LCSW [phone]

## 2012-12-23 ENCOUNTER — Non-Acute Institutional Stay (SKILLED_NURSING_FACILITY): Payer: Medicare Other | Admitting: Internal Medicine

## 2012-12-23 DIAGNOSIS — M1712 Unilateral primary osteoarthritis, left knee: Secondary | ICD-10-CM

## 2012-12-23 DIAGNOSIS — M1710 Unilateral primary osteoarthritis, unspecified knee: Secondary | ICD-10-CM

## 2012-12-23 DIAGNOSIS — J45909 Unspecified asthma, uncomplicated: Secondary | ICD-10-CM

## 2012-12-23 DIAGNOSIS — M109 Gout, unspecified: Secondary | ICD-10-CM

## 2012-12-23 DIAGNOSIS — I10 Essential (primary) hypertension: Secondary | ICD-10-CM

## 2012-12-28 ENCOUNTER — Non-Acute Institutional Stay (SKILLED_NURSING_FACILITY): Payer: Medicare Other | Admitting: Adult Health

## 2012-12-28 DIAGNOSIS — I10 Essential (primary) hypertension: Secondary | ICD-10-CM

## 2012-12-28 DIAGNOSIS — N400 Enlarged prostate without lower urinary tract symptoms: Secondary | ICD-10-CM

## 2012-12-28 DIAGNOSIS — J45909 Unspecified asthma, uncomplicated: Secondary | ICD-10-CM

## 2012-12-28 DIAGNOSIS — M1710 Unilateral primary osteoarthritis, unspecified knee: Secondary | ICD-10-CM

## 2012-12-28 DIAGNOSIS — M1712 Unilateral primary osteoarthritis, left knee: Secondary | ICD-10-CM

## 2012-12-28 DIAGNOSIS — M109 Gout, unspecified: Secondary | ICD-10-CM

## 2013-01-24 NOTE — Progress Notes (Signed)
Patient ID: Darrell Perez, male   DOB: [birth year], 72 y.o.   MRN: [id number]        HISTORY & PHYSICAL  DATE: 12/23/2012   FACILITY: [facility]  LEVEL OF CARE: SNF (31)  ALLERGIES:  Allergies  Allergen Reactions  . Naprosyn (Naproxen) Other (See Comments)    "makes me jittery/nervous"    CHIEF COMPLAINT:  Manage left knee osteoarthritis, asthma and hypertension.     HISTORY OF PRESENT ILLNESS:  The patient is a 72 year-old, Caucasian male.    KNEE OSTEOARTHRITIS: Patient had a history of pain and functional disability in the knee due to end-stage osteoarthritis and has failed nonsurgical conservative treatments. Patient had worsening of pain with activity and weight bearing, pain that interfered with activities of daily living & pain with passive range of motion. Therefore patient underwent total knee arthroplasty and tolerated the procedure well. Patient is admitted to this facility for sort short-term rehabilitation. Patient denies knee pain.  ASTHMA: The patient's asthma remains stable. Patient denies shortness of breath, dyspnea on exertion or wheezing. No complications reported from the medications currently being used.    HTN: Pt 's HTN remains stable.  Denies CP, sob, DOE, pedal edema, headaches, dizziness or visual disturbances.  No complications from the medications currently being used.  Last BP : 126/62, 128/64, 132/74.  PAST MEDICAL HISTORY :  Past Medical History  Diagnosis Date  . Hypertension   . Asthma     15-20 yrs ago  . H/O hiatal hernia   . History of kidney stones   . Left ureteral calculus   . GERD (gastroesophageal reflux disease)     NO ISSUES WHEN USING CPAP--  NO MEDS  . Frequency of urination   . Urgency of urination   . Nocturia   . Dry skin   . Arthritis   . Left knee DJD   . OSA on CPAP     cpap does not know settings   . URI (upper respiratory infection) 11/29/12     see office visit note in chart placed on Zpack    PAST  SURGICAL HISTORY: Past Surgical History  Procedure Laterality Date  . Tonsillectomy    . Carpal tunnel release Bilateral 1989  & 1991   . Trigger finger release Right 2006  . 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 Kidney stone surgery  1972 and 1977    removal of stones. (OPEN PROCUDURE)  . Reverse shoulder arthroplasty  10/09/2011    Procedure: REVERSE SHOULDER ARTHROPLASTY;  Surgeon: Senaida Lange, MD;  Location: [facility] OR;  Service: Orthopedics;  Laterality: Right;  right hardware removal and reverse shoulder arthroplasty  . Total knee arthroplasty  07/22/2012    Procedure: TOTAL KNEE ARTHROPLASTY;  Surgeon: Javier Docker, MD;  Location: [facility] ORS;  Service: Orthopedics;  Laterality: Right;  . Cataract extraction w/ intraocular lens  implant, bilateral  2005  &  2012  . Hemiarthroplasty right shoulder  2007  . Extracorporeal shock wave lithotripsy      MULTIPLE  . Ureterosopic stone extraction  2002  . Holmium laser application Left 11/23/2012    Procedure: HOLMIUM LASER APPLICATION;  Surgeon: Antony Haste, MD;  Location: [facility];  Service: Urology;  Laterality: Left;  . Total knee arthroplasty Left 12/16/2012    Procedure: LEFT TOTAL KNEE ARTHROPLASTY;  Surgeon: Javier Docker, MD;  Location: [facility] OR[facility];  Service: Orthopedics;  Laterality: Left;  SOCIAL HISTORY:  reports that he has never smoked. He has never used smokeless tobacco. He reports that he does not drink alcohol or use illicit drugs.  FAMILY HISTORY:  Family History  Problem Relation Age of Onset  . Anesthesia problems Neg Hx   . Hypotension Neg Hx   . Malignant hyperthermia Neg Hx   . Pseudochol deficiency Neg Hx   . Dementia Mother   . Dementia Father     CURRENT MEDICATIONS: Reviewed per [facility]  REVIEW OF SYSTEMS:  See HPI otherwise 14 point ROS is negative.  PHYSICAL EXAMINATION  VS:  T 97.6       P 64      RR 20      BP 126/62      POX 98% room air        WT (Lb)  GENERAL: no  acute distress, normal body habitus EYES: conjunctivae normal, sclerae normal, normal eye lids MOUTH/THROAT: lips without lesions,no lesions in the mouth,tongue is without lesions,uvula elevates in midline NECK: supple, trachea midline, no neck masses, no thyroid tenderness, no thyromegaly LYMPHATICS: no LAN in the neck, no supraclavicular LAN RESPIRATORY: breathing is even & unlabored, BS CTAB CARDIAC: RRR, no murmur,no extra heart sounds, no edema GI:  ABDOMEN: abdomen soft, normal BS, no masses, no tenderness  LIVER/SPLEEN: no hepatomegaly, no splenomegaly MUSCULOSKELETAL: HEAD: normal to inspection & palpation BACK: no kyphosis, scoliosis or spinal processes tenderness EXTREMITIES: LEFT UPPER EXTREMITY: full range of motion, normal strength & tone RIGHT UPPER EXTREMITY:  full range of motion, normal strength & tone LEFT LOWER EXTREMITY:  full range of motion, normal strength & tone RIGHT LOWER EXTREMITY:  full range of motion, normal strength & tone PSYCHIATRIC: the patient is alert & oriented to person, affect & behavior appropriate  LABS/RADIOLOGY: Hemoglobin 11.7, MCV 81.4, otherwise CBC normal.    Glucose 103, otherwise BMP normal.    Albumin 3.2.    Liver profile normal.    PT 12.1, INR 0.9, PTT 34.    Urinalysis negative.    MRSA by PCR negative.    Staph aureus by PCR negative.    Chest x-ray:  No acute disease.    Left knee x-ray showed mild degenerative changes.    Left knee x-ray postsurgically showed satisfactory knee replacement on the left.    ASSESSMENT/PLAN:  Left knee osteoarthritis.  Status post left total knee arthroplasty.  Continue rehabilitation.   Asthma.  Well compensated.   Hypertension.  Well controlled.    Gout.  Continue allopurinol.   Allergic rhinitis.  Continue Zyrtec.  No active symptoms.    Hypokalemia.  Continue supplementation.  Reassess.   BPH.  Continue Flomax.   Check CBC and BMP.   I have reviewed patient's medical  records received at admission/from hospitalization.  CPT CODE: 16109

## 2013-01-27 DIAGNOSIS — M1000 Idiopathic gout, unspecified site: Secondary | ICD-10-CM | POA: Insufficient documentation

## 2013-01-27 DIAGNOSIS — I10 Essential (primary) hypertension: Secondary | ICD-10-CM | POA: Insufficient documentation

## 2013-01-27 DIAGNOSIS — M109 Gout, unspecified: Secondary | ICD-10-CM | POA: Insufficient documentation

## 2013-01-27 DIAGNOSIS — J45909 Unspecified asthma, uncomplicated: Secondary | ICD-10-CM | POA: Insufficient documentation

## 2013-02-03 ENCOUNTER — Encounter: Payer: Self-pay | Admitting: Adult Health

## 2013-02-03 DIAGNOSIS — N400 Enlarged prostate without lower urinary tract symptoms: Secondary | ICD-10-CM | POA: Insufficient documentation

## 2013-02-03 NOTE — Progress Notes (Signed)
Patient ID: CEPHUS TUPY, male   DOB: [birth year], 72 y.o.   MRN: [id number]        PROGRESS NOTE  DATE: 12/28/2012   FACILITY: [facility]  LEVEL OF CARE: SNF (31)  CHIEF COMPLAINT:  Discharge Visit  HISTORY OF PRESENT ILLNESS:  This is a 72 year old male who is for discharge home with home health PT . He has been admitted to [facility] on 12/19/12 from [facility] with DJD S/P left total knee arthroplasty. He was admitted to this facility for short-term rehabilitation.  Patient has completed SNF rehabilitation and therapy has cleared the patient for discharge.  Reassessment of ongoing problem(s):  HTN: Pt 's HTN remains stable.  Denies CP, sob, DOE, pedal edema, headaches, dizziness or visual disturbances.  No complications from the medications currently being used.  Last BP : 136/76  BPH: The patient's BPH remains stable. Patient denies urinary hesitancy or dribbling. No complications reported from the current medications being used.  PAST MEDICAL HISTORY : Reviewed.  No changes.  CURRENT MEDICATIONS: Reviewed per [facility]  REVIEW OF SYSTEMS:  GENERAL: no change in appetite, no fatigue, no weight changes, no fever, chills or weakness RESPIRATORY: no cough, SOB, DOE, wheezing, hemoptysis CARDIAC: no chest pain, edema or palpitations GI: no abdominal pain, diarrhea, constipation, heart burn, nausea or vomiting  PHYSICAL EXAMINATION  VS:  T 97.7       P 80       RR 20      BP 136/76            WT 201.8 (Lb)  GENERAL: no acute distress, normal body habitus EYES: conjunctivae normal, sclerae normal, normal eye lids NECK: supple, trachea midline, no neck masses, no thyroid tenderness, no thyromegaly LYMPHATICS: no LAN in the neck, no supraclavicular LAN RESPIRATORY: breathing is even & unlabored, BS CTAB CARDIAC: RRR, no murmur,no extra heart sounds, no edema GI: abdomen soft, normal BS, no masses, no tenderness, no hepatomegaly, no  splenomegaly PSYCHIATRIC: the patient is alert & oriented to person, affect & behavior appropriate  LABS/RADIOLOGY: 12/21/12 WBC 8.7 hemoglobin 12.6 hematocrit 38.1 sodium 141 potassium 4.0 glucose 101 BUN 19 creatinine 1.03 calcium 8.9   ASSESSMENT/PLAN:  Essential hypertension - well controlled  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  Patient will receive home health PT.  Total discharge time: Less than 30 minutes Discharge time involved coordination of the discharge process with Child psychotherapist, nursing staff and therapy department. Medical justification for home health services verified.   CPT CODE: 40981

## 2013-04-02 ENCOUNTER — Emergency Department (HOSPITAL_COMMUNITY)
Admission: EM | Admit: 2013-04-02 | Discharge: 2013-04-02 | Disposition: A | Discharge status: Home / Self Care | Payer: Medicare Other | Attending: Emergency Medicine | Admitting: Emergency Medicine

## 2013-04-02 ENCOUNTER — Emergency Department (HOSPITAL_COMMUNITY): Payer: Medicare Other

## 2013-04-02 ENCOUNTER — Encounter (HOSPITAL_COMMUNITY): Payer: Self-pay | Admitting: Physical Medicine and Rehabilitation

## 2013-04-02 DIAGNOSIS — M129 Arthropathy, unspecified: Secondary | ICD-10-CM | POA: Insufficient documentation

## 2013-04-02 DIAGNOSIS — Z7982 Long term (current) use of aspirin: Secondary | ICD-10-CM | POA: Insufficient documentation

## 2013-04-02 DIAGNOSIS — J45909 Unspecified asthma, uncomplicated: Secondary | ICD-10-CM | POA: Insufficient documentation

## 2013-04-02 DIAGNOSIS — Z79899 Other long term (current) drug therapy: Secondary | ICD-10-CM | POA: Insufficient documentation

## 2013-04-02 DIAGNOSIS — Z87442 Personal history of urinary calculi: Secondary | ICD-10-CM | POA: Insufficient documentation

## 2013-04-02 DIAGNOSIS — G4733 Obstructive sleep apnea (adult) (pediatric): Secondary | ICD-10-CM | POA: Insufficient documentation

## 2013-04-02 DIAGNOSIS — K529 Noninfective gastroenteritis and colitis, unspecified: Secondary | ICD-10-CM

## 2013-04-02 DIAGNOSIS — Z872 Personal history of diseases of the skin and subcutaneous tissue: Secondary | ICD-10-CM | POA: Insufficient documentation

## 2013-04-02 DIAGNOSIS — Z87448 Personal history of other diseases of urinary system: Secondary | ICD-10-CM | POA: Insufficient documentation

## 2013-04-02 DIAGNOSIS — K219 Gastro-esophageal reflux disease without esophagitis: Secondary | ICD-10-CM | POA: Insufficient documentation

## 2013-04-02 DIAGNOSIS — I10 Essential (primary) hypertension: Secondary | ICD-10-CM | POA: Insufficient documentation

## 2013-04-02 DIAGNOSIS — K5289 Other specified noninfective gastroenteritis and colitis: Secondary | ICD-10-CM | POA: Insufficient documentation

## 2013-04-02 LAB — POCT I-STAT, CHEM 8
Chloride: 99 mEq/L (ref 96–112)
Creatinine, Ser: 1.1 mg/dL (ref 0.50–1.35)
Glucose, Bld: 100 mg/dL — ABNORMAL HIGH (ref 70–99)
HCT: 47 % (ref 39.0–52.0)
Potassium: 3.4 mEq/L — ABNORMAL LOW (ref 3.5–5.1)
Sodium: 138 mEq/L (ref 135–145)

## 2013-04-02 LAB — COMPREHENSIVE METABOLIC PANEL
ALT: 30 U/L (ref 0–53)
Albumin: 3.4 g/dL — ABNORMAL LOW (ref 3.5–5.2)
Alkaline Phosphatase: 51 U/L (ref 39–117)
Chloride: 102 mEq/L (ref 96–112)
Glucose, Bld: 99 mg/dL (ref 70–99)
Potassium: 3.5 mEq/L (ref 3.5–5.1)
Sodium: 140 mEq/L (ref 135–145)
Total Protein: 6.5 g/dL (ref 6.0–8.3)

## 2013-04-02 LAB — URINE MICROSCOPIC-ADD ON

## 2013-04-02 LAB — CBC WITH DIFFERENTIAL/PLATELET
Basophils Absolute: 0 10*3/uL (ref 0.0–0.1)
Basophils Relative: 0 % (ref 0–1)
Eosinophils Absolute: 0.3 10*3/uL (ref 0.0–0.7)
Hemoglobin: 15.3 g/dL (ref 13.0–17.0)
Lymphocytes Relative: 13 % (ref 12–46)
MCH: 27.7 pg (ref 26.0–34.0)
MCHC: 34.4 g/dL (ref 30.0–36.0)
Monocytes Relative: 9 % (ref 3–12)
Neutrophils Relative %: 76 % (ref 43–77)
Platelets: 302 10*3/uL (ref 150–400)
RBC: 5.53 MIL/uL (ref 4.22–5.81)
RDW: 15.2 % (ref 11.5–15.5)
WBC: 10 10*3/uL (ref 4.0–10.5)

## 2013-04-02 LAB — URINALYSIS, ROUTINE W REFLEX MICROSCOPIC
Bilirubin Urine: NEGATIVE
Nitrite: NEGATIVE
Specific Gravity, Urine: 1.024 (ref 1.005–1.030)
Urobilinogen, UA: 0.2 mg/dL (ref 0.0–1.0)
pH: 6 (ref 5.0–8.0)

## 2013-04-02 MED ORDER — ONDANSETRON 8 MG PO TBDP: ORAL_TABLET | ORAL | Status: DC

## 2013-04-02 MED ORDER — ONDANSETRON HCL 4 MG/2ML IJ SOLN
4.0000 mg | Freq: Once | INTRAMUSCULAR | Status: AC
  Administered 2013-04-02: 4 mg via INTRAVENOUS
  Filled 2013-04-02: qty 2

## 2013-04-02 MED ORDER — CIPROFLOXACIN HCL 500 MG PO TABS: 500.0000 mg | ORAL_TABLET | Freq: Two times a day (BID) | ORAL | Status: DC

## 2013-04-02 MED ORDER — FENTANYL CITRATE 0.05 MG/ML IJ SOLN
100.0000 ug | Freq: Once | INTRAMUSCULAR | Status: AC
  Administered 2013-04-02: 100 ug via INTRAVENOUS
  Filled 2013-04-02: qty 2

## 2013-04-02 MED ORDER — IOHEXOL 300 MG/ML  SOLN
25.0000 mL | Freq: Once | INTRAMUSCULAR | Status: AC | PRN
  Administered 2013-04-02: 25 mL via ORAL

## 2013-04-02 MED ORDER — METRONIDAZOLE 500 MG PO TABS: 500.0000 mg | ORAL_TABLET | Freq: Two times a day (BID) | ORAL | Status: DC

## 2013-04-02 MED ORDER — SODIUM CHLORIDE 0.9 % IV BOLUS (SEPSIS)
1000.0000 mL | Freq: Once | INTRAVENOUS | Status: AC
  Administered 2013-04-02: 1000 mL via INTRAVENOUS

## 2013-04-02 MED ORDER — IOHEXOL 300 MG/ML  SOLN
100.0000 mL | Freq: Once | INTRAMUSCULAR | Status: AC | PRN
  Administered 2013-04-02: 100 mL via INTRAVENOUS

## 2013-04-02 MED ORDER — METOCLOPRAMIDE HCL 10 MG PO TABS: 10.0000 mg | ORAL_TABLET | Freq: Four times a day (QID) | ORAL | Status: DC | PRN

## 2013-04-02 NOTE — ED Notes (Signed)
While in bathroom pt had episode of vomiting x 1. Clear, emesis. Had solid BM.

## 2013-04-02 NOTE — ED Notes (Signed)
Patient transported to CT 

## 2013-04-02 NOTE — ED Provider Notes (Signed)
CSN: [id number]     Arrival date & time 04/02/13  1500 History   First MD Initiated Contact with Patient 04/02/13 1510     Chief Complaint  Patient presents with  . Abdominal Pain   (Consider location/radiation/quality/duration/timing/severity/associated sxs/prior Treatment) HPI 72yo male one month Hx nonbloody diarrhea 6-12 per day but none today, after first week had several days of pain resolved after took one week Cipro/Flagyl, still has had loose stools, saw [facility] this week and gave stool samples yesterday results pending, diffuse lower abdominal pain returned yesterday so to ED today, pain mild constant worse if move or eat, no fever/CP/SOB/N/V or upper pain or dysuria, no Tx PTA. Past Medical History  Diagnosis Date  . Hypertension   . Asthma     15-20 yrs ago  . H/O hiatal hernia   . History of kidney stones   . Left ureteral calculus   . GERD (gastroesophageal reflux disease)     NO ISSUES WHEN USING CPAP--  NO MEDS  . Frequency of urination   . Urgency of urination   . Nocturia   . Dry skin   . Arthritis   . Left knee DJD   . OSA on CPAP     cpap does not know settings   . URI (upper respiratory infection) 11/29/12     see office visit note in chart placed on Zpack   Past Surgical History  Procedure Laterality Date  . Tonsillectomy    . Carpal tunnel release Bilateral 1989  & 1991   . Trigger finger release Right 2006  . 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 Kidney stone surgery  1972 and 1977    removal of stones. (OPEN PROCUDURE)  . Reverse shoulder arthroplasty  10/09/2011    Procedure: REVERSE SHOULDER ARTHROPLASTY;  Surgeon: Senaida Lange, MD;  Location: [facility] OR;  Service: Orthopedics;  Laterality: Right;  right hardware removal and reverse shoulder arthroplasty  . Total knee arthroplasty  07/22/2012    Procedure: TOTAL KNEE ARTHROPLASTY;  Surgeon: Javier Docker, MD;  Location: [facility];  Service: Orthopedics;  Laterality: Right;  . Cataract extraction  w/ intraocular lens  implant, bilateral  2005  &  2012  . Hemiarthroplasty right shoulder  2007  . Extracorporeal shock wave lithotripsy      MULTIPLE  . Ureterosopic stone extraction  2002  . Holmium laser application Left 11/23/2012    Procedure: HOLMIUM LASER APPLICATION;  Surgeon: Antony Haste, MD;  Location: [facility];  Service: Urology;  Laterality: Left;  . Total knee arthroplasty Left 12/16/2012    Procedure: LEFT TOTAL KNEE ARTHROPLASTY;  Surgeon: Javier Docker, MD;  Location: [facility];  Service: Orthopedics;  Laterality: Left;   Family History  Problem Relation Age of Onset  . Anesthesia problems Neg Hx   . Hypotension Neg Hx   . Malignant hyperthermia Neg Hx   . Pseudochol deficiency Neg Hx   . Dementia Mother   . Dementia Father    History  Substance Use Topics  . Smoking status: Never Smoker   . Smokeless tobacco: Never Used  . Alcohol Use: No    Review of Systems 10 Systems reviewed and are negative for acute change except as noted in the HPI. Allergies  Naprosyn  Home Medications   Current Outpatient Rx  Name  Route  Sig  Dispense  Refill  . acetaminophen (TYLENOL) 500 MG tablet   Oral  Take 500 mg by mouth every 6 (six) hours as needed for pain.         [name] allopurinol (ZYLOPRIM) 100 MG tablet   Oral   Take 100 mg by mouth daily at 12 noon.          [name] amoxicillin (AMOXIL) 500 MG capsule   Oral   Take 2,000 mg by mouth daily as needed. For dental procedure         . aspirin EC 81 MG tablet   Oral   Take 81 mg by mouth daily.         . cetirizine (ZYRTEC) 10 MG tablet   Oral   Take 10 mg by mouth daily. [facility] version of Zyrtec         . indapamide (LOZOL) 2.5 MG tablet   Oral   Take 2.5 mg by mouth every evening.          . methocarbamol (ROBAXIN) 500 MG tablet   Oral   Take 500 mg by mouth daily as needed (for muscle spasm).          [name] oxyCODONE-acetaminophen (ROXICET) 5-325 MG per tablet   Oral    Take 1-2 tablets by mouth every 4 (four) hours as needed for pain.   50 tablet   0   . pantoprazole (PROTONIX) 40 MG tablet   Oral   Take 40 mg by mouth as needed (for indigestion).          . pindolol (VISKEN) 5 MG tablet   Oral   Take 5 mg by mouth every evening.          . potassium chloride SA (K-DUR,KLOR-CON) 20 MEQ tablet   Oral   Take 20 mEq by mouth 2 (two) times daily.         . cholecalciferol (VITAMIN D) 1000 UNITS tablet   Oral   Take 1,000 Units by mouth daily.         . ciprofloxacin (CIPRO) 500 MG tablet   Oral   Take 1 tablet (500 mg total) by mouth 2 (two) times daily. One po bid x 14 days   28 tablet   0   . hydrocortisone cream 1 %   Topical   Apply 1 application topically 2 (two) times daily as needed. For itching         . metoCLOPramide (REGLAN) 10 MG tablet   Oral   Take 1 tablet (10 mg total) by mouth every 6 (six) hours as needed (nausea/headache).   6 tablet   0   . metroNIDAZOLE (FLAGYL) 500 MG tablet   Oral   Take 1 tablet (500 mg total) by mouth 2 (two) times daily. One po bid x 14 days   28 tablet   0   . ondansetron (ZOFRAN ODT) 8 MG disintegrating tablet      8mg  ODT q4 hours prn nausea   4 tablet   0    BP 164/67  Pulse 53  Temp(Src) 97.8 F (36.6 C) (Oral)  Resp 14  Ht 5\' 7"  (1.702 m)  Wt 200 lb (90.719 kg)  BMI 31.32 kg/m2  SpO2 97% Physical Exam  Nursing note and vitals reviewed. Constitutional:  Awake, alert, nontoxic appearance.  HENT:  Head: Atraumatic.  Eyes: Right eye exhibits no discharge. Left eye exhibits no discharge.  Neck: Neck supple.  Cardiovascular: Normal rate and regular rhythm.   No murmur heard. Pulmonary/Chest: Effort normal and breath sounds normal. No respiratory distress.  He has no wheezes. He has no rales. He exhibits no tenderness.  Abdominal: Soft. Bowel sounds are normal. He exhibits no distension and no mass. There is tenderness. There is no rebound and no guarding.  minimal  diffuse lower abdominal tenderness; upper abdomen NT; no rebound  Musculoskeletal: He exhibits no tenderness.  Baseline ROM, no obvious new focal weakness.  Neurological: He is alert.  Mental status and motor strength appears baseline for patient and situation.  Skin: No rash noted.  Psychiatric: He has a normal mood and affect.    ED Course  Procedures (including critical care time) Patient / Family / Caregiver understand and agree with initial ED impression and plan with expectations set for ED visit ordered CT.Patient / Family / Caregiver informed of clinical course, understand medical decision-making process, and agree with plan.  Labs Review Labs Reviewed  COMPREHENSIVE METABOLIC PANEL - Abnormal; Notable for the following:    Albumin 3.4 (*)    GFR calc non Af Amer 81 (*)    All other components within normal limits  URINALYSIS, ROUTINE W REFLEX MICROSCOPIC - Abnormal; Notable for the following:    Hgb urine dipstick SMALL (*)    Ketones, ur 15 (*)    Leukocytes, UA TRACE (*)    All other components within normal limits  POCT I-STAT, CHEM 8 - Abnormal; Notable for the following:    Potassium 3.4 (*)    Glucose, Bld 100 (*)    All other components within normal limits  CBC WITH DIFFERENTIAL  LIPASE, BLOOD  URINE MICROSCOPIC-ADD ON   Imaging Review Ct Abdomen Pelvis W Contrast  04/02/2013   CLINICAL DATA:  Persistent lower abdominal pain x1 month. Frequent diarrhea.  EXAM: CT ABDOMEN AND PELVIS WITH CONTRAST  TECHNIQUE: Multidetector CT imaging of the abdomen and pelvis was performed using the standard protocol following bolus administration of intravenous contrast.  CONTRAST:  OMNIPAQUE IOHEXOL 300 MG/ML  SOLN  COMPARISON:  CT of 11/09/2012 demonstrated none of the above findings.  FINDINGS: Lung bases are clear. No pericardial fluid.  No focal hepatic lesion. Gallbladder, pancreas, spleen, and adrenal glands are normal. There are several coarse nonobstructing calculi .  Adjacent calculi in lower pole the right kidney measured 14 mm each. Large calculus lower pole of the left kidney measures 12 mm. No evidence of hydronephrosis. No ureterolithiasis.  The stomach, duodenum, and jejunum are normal. There are no dilated loops of small bowel. CONTRAST flows through the small bowel into the ascending colon. There is a long segment of circumferential bowel wall thickening involving the mid and distal ileum. This extends to the terminal ileum. There is additionally circumferential bowel wall thickening involving the cecum and ascending colon. The transverse colon and left colon are normal. Rectum appears normal. No evidence of fistula or abscess associated with this diffuse bowel wall thickening involving primarily the ileum. Appendix is normal. No significant abdominal lymphadenopathy.  No free fluid the pelvis. The prostate gland and bladder are normal. No pelvic lymphadenopathy. Review of bone windows demonstrates no aggressive osseous lesions.  IMPRESSION: 1. Long segment of continuous bowel wall inflammation involving the ileum and cecum. Differential includes infectious ileitis and cecitis, inflammatory bowel disease, and less likely ischemic bowel disease. Neoplasm or hemorrhages is unlikely. These findings are new from CT of 11/09/2012.  2. Bilateral nonobstructing course renal calculi.   Electronically Signed   By: Genevive Bi M.D.   On: 04/02/2013 16:52    MDM   1. Colitis  2. Ileitis    I doubt any other [facility] precluding discharge at this time including, but not necessarily limited to the following:sepsis, appendicitis.     Hurman Horn, MD 04/03/13 1247

## 2013-04-02 NOTE — ED Notes (Signed)
Pt presents to department for evaluation of lower abdominal pain and diarrhea. Ongoing x1 month. 9/10 pain at the time. No nausea/vomiting. Was seen at PCP and prescribed antibiotics for divert ulosis. Pt is alert and oriented x4. NAD.

## 2013-04-02 NOTE — Discharge Instructions (Signed)
Colitis °Colitis is inflammation of the colon. Colitis can be a short-term or long-standing (chronic) illness. Crohn's disease and ulcerative colitis are 2 types of colitis which are chronic. They usually require lifelong treatment. °CAUSES  °There are many different causes of colitis, including: °· Viruses. °· Germs (bacteria). °· Medicine reactions. °SYMPTOMS  °· Diarrhea. °· Intestinal bleeding. °· Pain. °· Fever. °· Throwing up (vomiting). °· Tiredness (fatigue). °· Weight loss. °· Bowel blockage. °DIAGNOSIS  °The diagnosis of colitis is based on examination and stool or blood tests. X-rays, CT scan, and colonoscopy may also be needed. °TREATMENT  °Treatment may include: °· Fluids given through the vein (intravenously). °· Bowel rest (nothing to eat or drink for a period of time). °· Medicine for pain and diarrhea. °· Medicines (antibiotics) that kill germs. °· Cortisone medicines. °· Surgery. °HOME CARE INSTRUCTIONS  °· Get plenty of rest. °· Drink enough water and fluids to keep your urine clear or pale yellow. °· Eat a well-balanced diet. °· Call your caregiver for follow-up as recommended. °SEEK IMMEDIATE MEDICAL CARE IF:  °· You develop chills. °· You have an oral temperature above 102° F (38.9° C), not controlled by medicine. °· You have extreme weakness, fainting, or dehydration. °· You have repeated vomiting. °· You develop severe belly (abdominal) pain or are passing bloody or tarry stools. °MAKE SURE YOU:  °· Understand these instructions. °· Will watch your condition. °· Will get help right away if you are not doing well or get worse. °Document Released: 07/24/2004 Document Revised: 09/08/2011 Document Reviewed: 10/19/2009 °[employer]® Patient Information ©2014 [employer]. ° °

## 2013-06-02 ENCOUNTER — Other Ambulatory Visit: Payer: Self-pay | Admitting: Dermatology

## 2015-11-07 ENCOUNTER — Other Ambulatory Visit: Payer: Self-pay | Admitting: Internal Medicine

## 2015-11-07 DIAGNOSIS — R413 Other amnesia: Secondary | ICD-10-CM

## 2015-11-19 ENCOUNTER — Ambulatory Visit
Admission: RE | Admit: 2015-11-19 | Discharge: 2015-11-19 | Disposition: A | Discharge status: Home / Self Care | Payer: Medicare Other | Source: Ambulatory Visit | Attending: Internal Medicine | Admitting: Internal Medicine

## 2015-11-19 DIAGNOSIS — R413 Other amnesia: Secondary | ICD-10-CM

## 2015-11-19 MED ORDER — GADOBENATE DIMEGLUMINE 529 MG/ML IV SOLN
19.0000 mL | Freq: Once | INTRAVENOUS | Status: AC | PRN
  Administered 2015-11-19: 19 mL via INTRAVENOUS

## 2016-07-07 DIAGNOSIS — N200 Calculus of kidney: Secondary | ICD-10-CM | POA: Diagnosis not present

## 2016-07-07 DIAGNOSIS — N400 Enlarged prostate without lower urinary tract symptoms: Secondary | ICD-10-CM | POA: Diagnosis not present

## 2016-10-13 DIAGNOSIS — A932 Colorado tick fever: Secondary | ICD-10-CM | POA: Diagnosis not present

## 2016-10-13 DIAGNOSIS — S80861A Insect bite (nonvenomous), right lower leg, initial encounter: Secondary | ICD-10-CM | POA: Diagnosis not present

## 2016-10-21 DIAGNOSIS — Z1389 Encounter for screening for other disorder: Secondary | ICD-10-CM | POA: Diagnosis not present

## 2016-10-21 DIAGNOSIS — Z0000 Encounter for general adult medical examination without abnormal findings: Secondary | ICD-10-CM | POA: Diagnosis not present

## 2016-11-04 DIAGNOSIS — I10 Essential (primary) hypertension: Secondary | ICD-10-CM | POA: Diagnosis not present

## 2016-11-04 DIAGNOSIS — G4733 Obstructive sleep apnea (adult) (pediatric): Secondary | ICD-10-CM | POA: Diagnosis not present

## 2016-11-21 DIAGNOSIS — M545 Low back pain: Secondary | ICD-10-CM | POA: Diagnosis not present

## 2016-11-21 DIAGNOSIS — M5416 Radiculopathy, lumbar region: Secondary | ICD-10-CM | POA: Diagnosis not present

## 2016-11-21 DIAGNOSIS — A932 Colorado tick fever: Secondary | ICD-10-CM | POA: Diagnosis not present

## 2016-11-21 DIAGNOSIS — W57XXXA Bitten or stung by nonvenomous insect and other nonvenomous arthropods, initial encounter: Secondary | ICD-10-CM | POA: Diagnosis not present

## 2016-11-21 DIAGNOSIS — M47816 Spondylosis without myelopathy or radiculopathy, lumbar region: Secondary | ICD-10-CM | POA: Diagnosis not present

## 2016-11-21 DIAGNOSIS — R21 Rash and other nonspecific skin eruption: Secondary | ICD-10-CM | POA: Diagnosis not present

## 2016-12-15 DIAGNOSIS — M545 Low back pain: Secondary | ICD-10-CM | POA: Diagnosis not present

## 2016-12-22 DIAGNOSIS — M545 Low back pain: Secondary | ICD-10-CM | POA: Diagnosis not present

## 2016-12-22 DIAGNOSIS — M48062 Spinal stenosis, lumbar region with neurogenic claudication: Secondary | ICD-10-CM | POA: Diagnosis not present

## 2016-12-22 DIAGNOSIS — M5416 Radiculopathy, lumbar region: Secondary | ICD-10-CM | POA: Diagnosis not present

## 2017-01-07 DIAGNOSIS — H524 Presbyopia: Secondary | ICD-10-CM | POA: Diagnosis not present

## 2017-01-07 DIAGNOSIS — H43393 Other vitreous opacities, bilateral: Secondary | ICD-10-CM | POA: Diagnosis not present

## 2017-01-07 DIAGNOSIS — I10 Essential (primary) hypertension: Secondary | ICD-10-CM | POA: Diagnosis not present

## 2017-01-07 DIAGNOSIS — Z961 Presence of intraocular lens: Secondary | ICD-10-CM | POA: Diagnosis not present

## 2017-01-07 DIAGNOSIS — H52223 Regular astigmatism, bilateral: Secondary | ICD-10-CM | POA: Diagnosis not present

## 2017-01-07 DIAGNOSIS — H4322 Crystalline deposits in vitreous body, left eye: Secondary | ICD-10-CM | POA: Diagnosis not present

## 2017-01-07 DIAGNOSIS — H35033 Hypertensive retinopathy, bilateral: Secondary | ICD-10-CM | POA: Diagnosis not present

## 2017-01-07 DIAGNOSIS — H5203 Hypermetropia, bilateral: Secondary | ICD-10-CM | POA: Diagnosis not present

## 2017-03-23 ENCOUNTER — Emergency Department (HOSPITAL_BASED_OUTPATIENT_CLINIC_OR_DEPARTMENT_OTHER): Payer: PPO

## 2017-03-23 ENCOUNTER — Encounter (HOSPITAL_BASED_OUTPATIENT_CLINIC_OR_DEPARTMENT_OTHER): Payer: Self-pay | Admitting: *Deleted

## 2017-03-23 ENCOUNTER — Inpatient Hospital Stay (HOSPITAL_COMMUNITY): Payer: PPO

## 2017-03-23 ENCOUNTER — Observation Stay (HOSPITAL_COMMUNITY)
Admission: EM | Admit: 2017-03-23 | Discharge: 2017-03-24 | Disposition: A | Discharge status: Home / Self Care | Payer: PPO | Source: Home / Self Care | Attending: Emergency Medicine | Admitting: Emergency Medicine

## 2017-03-23 ENCOUNTER — Other Ambulatory Visit: Payer: Self-pay

## 2017-03-23 ENCOUNTER — Inpatient Hospital Stay (HOSPITAL_COMMUNITY): Payer: PPO | Admitting: Certified Registered Nurse Anesthetist

## 2017-03-23 ENCOUNTER — Encounter (HOSPITAL_COMMUNITY)
Admission: EM | Disposition: A | Discharge status: Home / Self Care | Payer: Self-pay | Source: Home / Self Care | Attending: Emergency Medicine

## 2017-03-23 DIAGNOSIS — M109 Gout, unspecified: Secondary | ICD-10-CM | POA: Diagnosis not present

## 2017-03-23 DIAGNOSIS — M1712 Unilateral primary osteoarthritis, left knee: Secondary | ICD-10-CM | POA: Insufficient documentation

## 2017-03-23 DIAGNOSIS — E876 Hypokalemia: Secondary | ICD-10-CM

## 2017-03-23 DIAGNOSIS — Z886 Allergy status to analgesic agent status: Secondary | ICD-10-CM

## 2017-03-23 DIAGNOSIS — G4733 Obstructive sleep apnea (adult) (pediatric): Secondary | ICD-10-CM | POA: Diagnosis present

## 2017-03-23 DIAGNOSIS — K219 Gastro-esophageal reflux disease without esophagitis: Secondary | ICD-10-CM | POA: Diagnosis present

## 2017-03-23 DIAGNOSIS — J45909 Unspecified asthma, uncomplicated: Secondary | ICD-10-CM

## 2017-03-23 DIAGNOSIS — Z96653 Presence of artificial knee joint, bilateral: Secondary | ICD-10-CM | POA: Insufficient documentation

## 2017-03-23 DIAGNOSIS — I480 Paroxysmal atrial fibrillation: Secondary | ICD-10-CM | POA: Diagnosis not present

## 2017-03-23 DIAGNOSIS — K5730 Diverticulosis of large intestine without perforation or abscess without bleeding: Secondary | ICD-10-CM

## 2017-03-23 DIAGNOSIS — R1032 Left lower quadrant pain: Secondary | ICD-10-CM | POA: Diagnosis not present

## 2017-03-23 DIAGNOSIS — Z9989 Dependence on other enabling machines and devices: Secondary | ICD-10-CM

## 2017-03-23 DIAGNOSIS — Z9841 Cataract extraction status, right eye: Secondary | ICD-10-CM | POA: Diagnosis not present

## 2017-03-23 DIAGNOSIS — N200 Calculus of kidney: Secondary | ICD-10-CM

## 2017-03-23 DIAGNOSIS — N201 Calculus of ureter: Secondary | ICD-10-CM | POA: Diagnosis present

## 2017-03-23 DIAGNOSIS — Z96611 Presence of right artificial shoulder joint: Secondary | ICD-10-CM | POA: Diagnosis not present

## 2017-03-23 DIAGNOSIS — R944 Abnormal results of kidney function studies: Secondary | ICD-10-CM | POA: Diagnosis not present

## 2017-03-23 DIAGNOSIS — I471 Supraventricular tachycardia: Secondary | ICD-10-CM | POA: Diagnosis not present

## 2017-03-23 DIAGNOSIS — Z79899 Other long term (current) drug therapy: Secondary | ICD-10-CM | POA: Diagnosis not present

## 2017-03-23 DIAGNOSIS — N136 Pyonephrosis: Secondary | ICD-10-CM | POA: Diagnosis not present

## 2017-03-23 DIAGNOSIS — Z961 Presence of intraocular lens: Secondary | ICD-10-CM | POA: Diagnosis present

## 2017-03-23 DIAGNOSIS — Z7982 Long term (current) use of aspirin: Secondary | ICD-10-CM

## 2017-03-23 DIAGNOSIS — I10 Essential (primary) hypertension: Secondary | ICD-10-CM | POA: Diagnosis present

## 2017-03-23 DIAGNOSIS — N12 Tubulo-interstitial nephritis, not specified as acute or chronic: Secondary | ICD-10-CM

## 2017-03-23 DIAGNOSIS — N400 Enlarged prostate without lower urinary tract symptoms: Secondary | ICD-10-CM

## 2017-03-23 DIAGNOSIS — A419 Sepsis, unspecified organism: Secondary | ICD-10-CM | POA: Diagnosis not present

## 2017-03-23 DIAGNOSIS — N179 Acute kidney failure, unspecified: Secondary | ICD-10-CM | POA: Diagnosis not present

## 2017-03-23 DIAGNOSIS — K449 Diaphragmatic hernia without obstruction or gangrene: Secondary | ICD-10-CM | POA: Diagnosis not present

## 2017-03-23 DIAGNOSIS — N281 Cyst of kidney, acquired: Secondary | ICD-10-CM

## 2017-03-23 DIAGNOSIS — N132 Hydronephrosis with renal and ureteral calculous obstruction: Secondary | ICD-10-CM

## 2017-03-23 DIAGNOSIS — I4891 Unspecified atrial fibrillation: Secondary | ICD-10-CM | POA: Diagnosis not present

## 2017-03-23 DIAGNOSIS — D72829 Elevated white blood cell count, unspecified: Secondary | ICD-10-CM | POA: Diagnosis not present

## 2017-03-23 DIAGNOSIS — I493 Ventricular premature depolarization: Secondary | ICD-10-CM | POA: Diagnosis present

## 2017-03-23 DIAGNOSIS — Z9842 Cataract extraction status, left eye: Secondary | ICD-10-CM | POA: Diagnosis not present

## 2017-03-23 DIAGNOSIS — N390 Urinary tract infection, site not specified: Secondary | ICD-10-CM

## 2017-03-23 DIAGNOSIS — N210 Calculus in bladder: Secondary | ICD-10-CM | POA: Diagnosis not present

## 2017-03-23 DIAGNOSIS — Z87442 Personal history of urinary calculi: Secondary | ICD-10-CM | POA: Insufficient documentation

## 2017-03-23 DIAGNOSIS — I483 Typical atrial flutter: Secondary | ICD-10-CM | POA: Diagnosis not present

## 2017-03-23 DIAGNOSIS — R109 Unspecified abdominal pain: Secondary | ICD-10-CM | POA: Diagnosis not present

## 2017-03-23 DIAGNOSIS — I361 Nonrheumatic tricuspid (valve) insufficiency: Secondary | ICD-10-CM | POA: Diagnosis not present

## 2017-03-23 DIAGNOSIS — B952 Enterococcus as the cause of diseases classified elsewhere: Secondary | ICD-10-CM | POA: Diagnosis not present

## 2017-03-23 DIAGNOSIS — N202 Calculus of kidney with calculus of ureter: Secondary | ICD-10-CM | POA: Diagnosis not present

## 2017-03-23 DIAGNOSIS — I4892 Unspecified atrial flutter: Secondary | ICD-10-CM | POA: Diagnosis not present

## 2017-03-23 DIAGNOSIS — J90 Pleural effusion, not elsewhere classified: Secondary | ICD-10-CM | POA: Diagnosis not present

## 2017-03-23 HISTORY — DX: Calculus of kidney: N20.0

## 2017-03-23 HISTORY — PX: CYSTOSCOPY WITH STENT PLACEMENT: SHX5790

## 2017-03-23 LAB — URINALYSIS, ROUTINE W REFLEX MICROSCOPIC
Bilirubin Urine: NEGATIVE
Glucose, UA: NEGATIVE mg/dL
Ketones, ur: NEGATIVE mg/dL
Nitrite: NEGATIVE
PROTEIN: 30 mg/dL — AB
pH: 5.5 (ref 5.0–8.0)

## 2017-03-23 LAB — URINALYSIS, MICROSCOPIC (REFLEX)

## 2017-03-23 LAB — CBC WITH DIFFERENTIAL/PLATELET
BASOS ABS: 0.1 10*3/uL (ref 0.0–0.1)
Basophils Relative: 1 %
EOS PCT: 3 %
Eosinophils Absolute: 0.3 10*3/uL (ref 0.0–0.7)
HEMATOCRIT: 45.5 % (ref 39.0–52.0)
Hemoglobin: 15.9 g/dL (ref 13.0–17.0)
LYMPHS ABS: 1.1 10*3/uL (ref 0.7–4.0)
LYMPHS PCT: 12 %
MCH: 30.5 pg (ref 26.0–34.0)
MCHC: 34.9 g/dL (ref 30.0–36.0)
MCV: 87.3 fL (ref 78.0–100.0)
Monocytes Absolute: 0.6 10*3/uL (ref 0.1–1.0)
Monocytes Relative: 6 %
NEUTROS ABS: 7.9 10*3/uL — AB (ref 1.7–7.7)
Neutrophils Relative %: 78 %
Platelets: 260 10*3/uL (ref 150–400)
RBC: 5.21 MIL/uL (ref 4.22–5.81)
RDW: 14.4 % (ref 11.5–15.5)
WBC: 9.9 10*3/uL (ref 4.0–10.5)

## 2017-03-23 LAB — TROPONIN I
Troponin I: 0.03 ng/mL (ref <0.03)
Troponin I: <0.03 ng/mL (ref <0.03)

## 2017-03-23 LAB — BASIC METABOLIC PANEL
ANION GAP: 8 (ref 5–15)
BUN: 23 mg/dL — ABNORMAL HIGH (ref 6–20)
CO2: 25 mmol/L (ref 22–32)
Calcium: 9.2 mg/dL (ref 8.9–10.3)
Chloride: 105 mmol/L (ref 101–111)
Creatinine, Ser: 1.25 mg/dL — ABNORMAL HIGH (ref 0.61–1.24)
GFR calc Af Amer: >60 mL/min (ref >60)
GFR calc non Af Amer: 54 mL/min — ABNORMAL LOW (ref >60)
Glucose, Bld: 146 mg/dL — ABNORMAL HIGH (ref 65–99)
POTASSIUM: 3.3 mmol/L — AB (ref 3.5–5.1)
Sodium: 138 mmol/L (ref 135–145)

## 2017-03-23 LAB — MAGNESIUM: Magnesium: 1.7 mg/dL (ref 1.7–2.4)

## 2017-03-23 SURGERY — CYSTOSCOPY, WITH STENT INSERTION
Anesthesia: General | Laterality: Left

## 2017-03-23 MED ORDER — IOHEXOL 300 MG/ML  SOLN
INTRAMUSCULAR | Status: DC | PRN
  Administered 2017-03-23: 10 mL

## 2017-03-23 MED ORDER — SODIUM CHLORIDE 0.9 % IV BOLUS (SEPSIS)
1000.0000 mL | Freq: Once | INTRAVENOUS | Status: AC
  Administered 2017-03-23: 1000 mL via INTRAVENOUS

## 2017-03-23 MED ORDER — LEVALBUTEROL HCL 0.63 MG/3ML IN NEBU: 0.6300 mg | INHALATION_SOLUTION | Freq: Four times a day (QID) | RESPIRATORY_TRACT | Status: DC | PRN

## 2017-03-23 MED ORDER — HEPARIN SODIUM (PORCINE) 5000 UNIT/ML IJ SOLN
5000.0000 [IU] | Freq: Three times a day (TID) | INTRAMUSCULAR | Status: DC
  Administered 2017-03-23 – 2017-03-24 (×2): 5000 [IU] via SUBCUTANEOUS
  Filled 2017-03-23 (×4): qty 1

## 2017-03-23 MED ORDER — ONDANSETRON HCL 4 MG PO TABS: 4.0000 mg | ORAL_TABLET | Freq: Four times a day (QID) | ORAL | Status: DC | PRN

## 2017-03-23 MED ORDER — HYDROMORPHONE HCL 1 MG/ML IJ SOLN
1.0000 mg | Freq: Once | INTRAMUSCULAR | Status: AC
  Administered 2017-03-23: 1 mg via INTRAVENOUS
  Filled 2017-03-23: qty 1

## 2017-03-23 MED ORDER — ONDANSETRON HCL 4 MG/2ML IJ SOLN
INTRAMUSCULAR | Status: AC
  Filled 2017-03-23: qty 2

## 2017-03-23 MED ORDER — VITAMIN D3 25 MCG (1000 UNIT) PO TABS
1000.0000 [IU] | ORAL_TABLET | Freq: Every day | ORAL | Status: DC
  Administered 2017-03-23 – 2017-03-24 (×2): 1000 [IU] via ORAL
  Filled 2017-03-23 (×2): qty 1

## 2017-03-23 MED ORDER — DEXAMETHASONE SODIUM PHOSPHATE 4 MG/ML IJ SOLN
INTRAMUSCULAR | Status: DC | PRN
  Administered 2017-03-23: 10 mg via INTRAVENOUS

## 2017-03-23 MED ORDER — FENTANYL CITRATE (PF) 100 MCG/2ML IJ SOLN: 25.0000 ug | INTRAMUSCULAR | Status: DC | PRN

## 2017-03-23 MED ORDER — VITAMIN B-12 1000 MCG PO TABS
1000.0000 ug | ORAL_TABLET | Freq: Every day | ORAL | Status: DC
  Administered 2017-03-23 – 2017-03-24 (×2): 1000 ug via ORAL
  Filled 2017-03-23 (×2): qty 1

## 2017-03-23 MED ORDER — ONDANSETRON HCL 4 MG/2ML IJ SOLN: 4.0000 mg | Freq: Once | INTRAMUSCULAR | Status: DC | PRN

## 2017-03-23 MED ORDER — DEXAMETHASONE SODIUM PHOSPHATE 10 MG/ML IJ SOLN
INTRAMUSCULAR | Status: AC
  Filled 2017-03-23: qty 1

## 2017-03-23 MED ORDER — FENTANYL CITRATE (PF) 100 MCG/2ML IJ SOLN
INTRAMUSCULAR | Status: AC
  Filled 2017-03-23: qty 2

## 2017-03-23 MED ORDER — PINDOLOL 5 MG PO TABS
5.0000 mg | ORAL_TABLET | Freq: Every evening | ORAL | Status: DC
  Administered 2017-03-23: 5 mg via ORAL
  Filled 2017-03-23 (×2): qty 1

## 2017-03-23 MED ORDER — SODIUM CHLORIDE 0.9 % IR SOLN
Status: DC | PRN
  Administered 2017-03-23: 3000 mL via INTRAVESICAL

## 2017-03-23 MED ORDER — EPHEDRINE 5 MG/ML INJ
INTRAVENOUS | Status: AC
  Filled 2017-03-23: qty 10

## 2017-03-23 MED ORDER — ONDANSETRON HCL 4 MG/2ML IJ SOLN
4.0000 mg | Freq: Four times a day (QID) | INTRAMUSCULAR | Status: DC | PRN
  Administered 2017-03-23 (×2): 4 mg via INTRAVENOUS
  Filled 2017-03-23: qty 2

## 2017-03-23 MED ORDER — 0.9 % SODIUM CHLORIDE (POUR BTL) OPTIME
TOPICAL | Status: DC | PRN
  Administered 2017-03-23: 1000 mL

## 2017-03-23 MED ORDER — HYOSCYAMINE SULFATE 0.125 MG PO TBDP
0.1250 mg | ORAL_TABLET | Freq: Four times a day (QID) | ORAL | Status: DC | PRN
Start: 2017-03-23 — End: 2017-03-24
  Filled 2017-03-23: qty 1

## 2017-03-23 MED ORDER — DEXTROSE 5 % IV SOLN
1.0000 g | INTRAVENOUS | Status: DC
  Administered 2017-03-23 – 2017-03-24 (×2): 1 g via INTRAVENOUS
  Filled 2017-03-23 (×2): qty 10

## 2017-03-23 MED ORDER — EPHEDRINE SULFATE 50 MG/ML IJ SOLN
INTRAMUSCULAR | Status: DC | PRN
  Administered 2017-03-23: 10 mg via INTRAVENOUS

## 2017-03-23 MED ORDER — FENTANYL CITRATE (PF) 100 MCG/2ML IJ SOLN
INTRAMUSCULAR | Status: DC | PRN
  Administered 2017-03-23: 25 ug via INTRAVENOUS
  Administered 2017-03-23: 50 ug via INTRAVENOUS
  Administered 2017-03-23: 25 ug via INTRAVENOUS

## 2017-03-23 MED ORDER — LORATADINE 10 MG PO TABS
10.0000 mg | ORAL_TABLET | Freq: Every day | ORAL | Status: DC
  Administered 2017-03-23 – 2017-03-24 (×2): 10 mg via ORAL
  Filled 2017-03-23: qty 1

## 2017-03-23 MED ORDER — POTASSIUM CHLORIDE CRYS ER 20 MEQ PO TBCR
20.0000 meq | EXTENDED_RELEASE_TABLET | Freq: Two times a day (BID) | ORAL | Status: DC
  Administered 2017-03-23 – 2017-03-24 (×3): 20 meq via ORAL
  Filled 2017-03-23 (×3): qty 1

## 2017-03-23 MED ORDER — LIDOCAINE HCL (CARDIAC) 20 MG/ML IV SOLN
INTRAVENOUS | Status: DC | PRN
  Administered 2017-03-23: 80 mg via INTRAVENOUS

## 2017-03-23 MED ORDER — MORPHINE SULFATE (PF) 4 MG/ML IV SOLN
2.0000 mg | INTRAVENOUS | Status: DC | PRN
  Administered 2017-03-23: 2 mg via INTRAVENOUS
  Filled 2017-03-23: qty 1

## 2017-03-23 MED ORDER — ASPIRIN EC 81 MG PO TBEC
81.0000 mg | DELAYED_RELEASE_TABLET | Freq: Every day | ORAL | Status: DC
  Administered 2017-03-23 – 2017-03-24 (×2): 81 mg via ORAL
  Filled 2017-03-23 (×2): qty 1

## 2017-03-23 MED ORDER — PROPOFOL 10 MG/ML IV BOLUS
INTRAVENOUS | Status: DC | PRN
  Administered 2017-03-23: 150 mg via INTRAVENOUS

## 2017-03-23 MED ORDER — HYDROCODONE-ACETAMINOPHEN 5-325 MG PO TABS
1.0000 | ORAL_TABLET | Freq: Four times a day (QID) | ORAL | Status: DC | PRN
  Administered 2017-03-23: 1 via ORAL
  Filled 2017-03-23: qty 1

## 2017-03-23 MED ORDER — LIDOCAINE 2% (20 MG/ML) 5 ML SYRINGE
INTRAMUSCULAR | Status: AC
  Filled 2017-03-23: qty 5

## 2017-03-23 MED ORDER — ALLOPURINOL 100 MG PO TABS
100.0000 mg | ORAL_TABLET | Freq: Every day | ORAL | Status: DC
  Administered 2017-03-23 – 2017-03-24 (×2): 100 mg via ORAL
  Filled 2017-03-23 (×2): qty 1

## 2017-03-23 MED ORDER — LACTATED RINGERS IV SOLN
INTRAVENOUS | Status: DC | PRN
  Administered 2017-03-23: 14:00:00 via INTRAVENOUS

## 2017-03-23 MED ORDER — ACETAMINOPHEN 500 MG PO TABS: 500.0000 mg | ORAL_TABLET | Freq: Four times a day (QID) | ORAL | Status: DC | PRN

## 2017-03-23 MED ORDER — ONDANSETRON HCL 4 MG/2ML IJ SOLN
4.0000 mg | Freq: Once | INTRAMUSCULAR | Status: AC
  Administered 2017-03-23: 4 mg via INTRAVENOUS
  Filled 2017-03-23: qty 2

## 2017-03-23 MED ORDER — HEPARIN SODIUM (PORCINE) 5000 UNIT/ML IJ SOLN: 5000.0000 [IU] | Freq: Three times a day (TID) | INTRAMUSCULAR | Status: DC

## 2017-03-23 MED ORDER — PROPOFOL 10 MG/ML IV BOLUS
INTRAVENOUS | Status: AC
  Filled 2017-03-23: qty 40

## 2017-03-23 MED ORDER — POTASSIUM CHLORIDE IN NACL 40-0.9 MEQ/L-% IV SOLN
INTRAVENOUS | Status: DC
  Administered 2017-03-23 – 2017-03-24 (×2): 75 mL/h via INTRAVENOUS
  Filled 2017-03-23 (×3): qty 1000

## 2017-03-23 MED ORDER — LACTATED RINGERS IV SOLN: INTRAVENOUS | Status: DC

## 2017-03-23 MED ORDER — MORPHINE SULFATE (PF) 4 MG/ML IV SOLN
4.0000 mg | Freq: Once | INTRAVENOUS | Status: AC
  Administered 2017-03-23: 4 mg via INTRAVENOUS
  Filled 2017-03-23: qty 1

## 2017-03-23 SURGICAL SUPPLY — 13 items
BAG URO CATCHER STRL LF (MISCELLANEOUS) ×3 IMPLANT
CATH INTERMIT  6FR 70CM (CATHETERS) ×3 IMPLANT
CLOTH BEACON ORANGE TIMEOUT ST (SAFETY) ×3 IMPLANT
COVER FOOTSWITCH UNIV (MISCELLANEOUS) IMPLANT
COVER SURGICAL LIGHT HANDLE (MISCELLANEOUS) ×3 IMPLANT
GLOVE BIOGEL M STRL SZ7.5 (GLOVE) ×3 IMPLANT
GOWN STRL REUS W/TWL LRG LVL3 (GOWN DISPOSABLE) ×6 IMPLANT
GUIDEWIRE STR DUAL SENSOR (WIRE) ×5 IMPLANT
MANIFOLD NEPTUNE II (INSTRUMENTS) ×3 IMPLANT
PACK CYSTO (CUSTOM PROCEDURE TRAY) ×3 IMPLANT
STENT CONTOUR 6FRX26X.038 (STENTS) ×2 IMPLANT
TUBING CONNECTING 10 (TUBING) ×2 IMPLANT
TUBING CONNECTING 10' (TUBING) ×1

## 2017-03-23 NOTE — ED Notes (Signed)
Call report to Amber at [phone] at [facility]

## 2017-03-23 NOTE — ED Notes (Signed)
Dr. Dina Rich in to update pt/family, at [facility].

## 2017-03-23 NOTE — Plan of Care (Signed)
Problem: Safety: Goal: Ability to remain free from injury will improve Outcome: Completed/Met Date Met: 03/23/17 Bed alarm in place. Pt encouraged to call for assistance when needing to get out of bed. Pt verbalized understanding   

## 2017-03-23 NOTE — Anesthesia Preprocedure Evaluation (Addendum)
Anesthesia Evaluation  Patient identified by MRN, date of birth, ID band Patient awake    Reviewed: Allergy & Precautions, H&P , NPO status , Patient's Chart, lab work & pertinent test results, reviewed documented beta blocker date and time   Airway Mallampati: III  TM Distance: >3 FB Neck ROM: full    Dental  (+) Dental Advisory Given, Teeth Intact   Pulmonary asthma , sleep apnea and Continuous Positive Airway Pressure Ventilation ,    Pulmonary exam normal breath sounds clear to auscultation       Cardiovascular Exercise Tolerance: Good hypertension,  Rhythm:regular Rate:Normal     Neuro/Psych negative neurological ROS  negative psych ROS   GI/Hepatic Neg liver ROS, hiatal hernia, GERD  Medicated,  Endo/Other  negative endocrine ROS  Renal/GU Renal disease     Musculoskeletal  (+) Arthritis , Osteoarthritis,    Abdominal   Peds  Hematology negative hematology ROS (+)   Anesthesia Other Findings   Reproductive/Obstetrics                          Anesthesia Physical  Anesthesia Plan  ASA: III  Anesthesia Plan: General   Post-op Pain Management:    Induction: Intravenous  PONV Risk Score and Plan: 4 or greater and Ondansetron, Dexamethasone and Treatment may vary due to age or medical condition  Airway Management Planned: LMA  Additional Equipment: None  Intra-op Plan:   Post-operative Plan: Extubation in OR  Informed Consent: I have reviewed the patients History and Physical, chart, labs and discussed the procedure including the risks, benefits and alternatives for the proposed anesthesia with the patient or authorized representative who has indicated his/her understanding and acceptance.   Dental advisory given  Plan Discussed with: CRNA  Anesthesia Plan Comments:        Anesthesia Quick Evaluation

## 2017-03-23 NOTE — ED Notes (Signed)
Report given to [facility] at [facility] ED

## 2017-03-23 NOTE — ED Notes (Signed)
Report to [place] with [facility]

## 2017-03-23 NOTE — ED Notes (Signed)
To CT

## 2017-03-23 NOTE — H&P (Addendum)
[facility] History and Physical  Darrell Perez GYI:[id number] DOB: 1940-09-15 DOA: 03/23/2017  Referring physician:   PCP: Lavone Orn, MD   Chief Complaint:    HPI:   76 year old male with a history of nephrolithiasis, GERD, hypertension, sleep apnea who presents with left flank pain, which started at 8 PM on 9/23. The patient had associated nausea vomiting. Patient took Norco at home without relief. No associated fever or chills BP (!) 173/83   Pulse 79   Temp 98.2 F (36.8 C)   Resp (!) 23   Ht 5\' 9"  (1.753 m)   Wt 95.3 kg (210 lb)   SpO2 91%   BMI 31.01 kg/m  . Initially lab work and KUB obtained. Initial KUB was negative. Patient was given fluids, morphine, and Zofran. Lab work notable for mild a Creatinine of 1.25. Baseline 1. No significant leukocytosis. Urinalysis does have too numerous to count white cells and red cells. CT stone study obtained. Patient has an obstructing 7 mm stone on the left. Given his persistent and refractory pain, patient was discussed by EDP with Dr. Link Snuffer, urology. Patient transferred to [facility] long for evaluation. Seen by neurology plan is for left ureteral stent placement.    Review of Systems: negative for the following  Constitutional: Denies fever, chills, diaphoresis, appetite change and fatigue.  HEENT: Denies photophobia, eye pain, redness, hearing loss, ear pain, congestion, sore throat, rhinorrhea, sneezing, mouth sores, trouble swallowing, neck pain, neck stiffness and tinnitus.  Respiratory: Denies SOB, DOE, cough, chest tightness, and wheezing.  Cardiovascular: Denies chest pain, palpitations and leg swelling.  Gastrointestinal: Positive for nausea and vomiting.  Genitourinary: Positive for flank pain  Musculoskeletal: Denies myalgias, back pain, joint swelling, arthralgias and gait problem.  Skin: Denies pallor, rash and wound.  Neurological: Denies dizziness, seizures, syncope, weakness, light-headedness, numbness and  headaches.  Hematological: Denies adenopathy. Easy bruising, personal or family bleeding history  Psychiatric/Behavioral: Denies suicidal ideation, mood changes, confusion, nervousness, sleep disturbance and agitation       Past Medical History:  Diagnosis Date  . Arthritis   . Asthma    15-20 yrs ago  . Dry skin   . Frequency of urination   . GERD (gastroesophageal reflux disease)    NO ISSUES WHEN USING CPAP--  NO MEDS  . Goiter   . H/O hiatal hernia   . History of kidney stones   . Hypertension   . Left knee DJD   . Left ureteral calculus   . Nephrolithiasis 03/23/2017  . Nocturia   . OSA on CPAP    cpap does not know settings   . Urgency of urination   . URI (upper respiratory infection) 11/29/12    see office visit note in chart placed on Zpack     Past Surgical History:  Procedure Laterality Date  . CARPAL TUNNEL RELEASE Bilateral [place]   . CATARACT EXTRACTION W/ INTRAOCULAR LENS  IMPLANT, BILATERAL  2005  &  2012  . EXTRACORPOREAL SHOCK WAVE LITHOTRIPSY     MULTIPLE  . HEMIARTHROPLASTY RIGHT SHOULDER  2007  . HOLMIUM LASER APPLICATION Left 3/50/0938   Procedure: HOLMIUM LASER APPLICATION;  Surgeon: Fredricka Bonine, MD;  Location: [facility];  Service: Urology;  Laterality: Left;  . [place]   removal of stones. (OPEN PROCUDURE)  . REVERSE SHOULDER ARTHROPLASTY  10/09/2011   Procedure: REVERSE SHOULDER ARTHROPLASTY;  Surgeon: Marin Shutter, MD;  Location: [facility]  OR;  Service: Orthopedics;  Laterality: Right;  right hardware removal and reverse shoulder arthroplasty  . THYROID LOBECTOMY  1967   goiter removed  . TONSILLECTOMY    . TOTAL KNEE ARTHROPLASTY  07/22/2012   Procedure: TOTAL KNEE ARTHROPLASTY;  Surgeon: Johnn Hai, MD;  Location: [facility] ORS;  Service: Orthopedics;  Laterality: Right;  . TOTAL KNEE ARTHROPLASTY Left 12/16/2012   Procedure: LEFT TOTAL KNEE ARTHROPLASTY;  Surgeon: Johnn Hai, MD;   Location: [facility];  Service: Orthopedics;  Laterality: Left;  . TRIGGER FINGER RELEASE Right 2006  . [place] EXTRACTION  2002      Social History:  reports that he has never smoked. He has never used smokeless tobacco. He reports that he does not drink alcohol or use drugs.    Allergies  Allergen Reactions  . Naprosyn [Naproxen] Other (See Comments)    "makes me jittery/nervous"    Family History  Problem Relation Age of Onset  . Dementia Mother   . Dementia Father   . Anesthesia problems Neg Hx   . Hypotension Neg Hx   . Malignant hyperthermia Neg Hx   . Pseudochol deficiency Neg Hx         Prior to Admission medications   Medication Sig Start Date End Date Taking? Authorizing Provider  acetaminophen (TYLENOL) 500 MG tablet Take by mouth every 6 (six) hours as needed for mild pain, moderate pain, fever or headache.    Yes [provider]  allopurinol (ZYLOPRIM) 100 MG tablet Take 100 mg by mouth daily.    Yes [provider]  amoxicillin (AMOXIL) 500 MG capsule Take 2,000 mg by mouth daily as needed. For dental procedure   Yes [provider]  aspirin EC 81 MG tablet Take 81 mg by mouth daily.   Yes [provider]  cholecalciferol (VITAMIN D) 1000 UNITS tablet Take 1,000 Units by mouth daily.   Yes [provider]  HYDROcodone-acetaminophen (NORCO/VICODIN) 5-325 MG tablet Take 1 tablet by mouth every 6 (six) hours as needed for moderate pain.   Yes [provider]  hyoscyamine (ANASPAZ) 0.125 MG TBDP disintergrating tablet Place 0.125 mg under the tongue every 6 (six) hours as needed for cramping.   Yes [provider]  indapamide (LOZOL) 2.5 MG tablet Take 2.5 mg by mouth every evening.    Yes [provider]  loperamide (IMODIUM A-D) 2 MG tablet Take 2-4 mg by mouth 4 (four) times daily as needed for diarrhea or loose stools.   Yes [provider]  loratadine (CLARITIN) 10 MG tablet  Take 10 mg by mouth daily.   Yes [provider]  pindolol (VISKEN) 5 MG tablet Take 5 mg by mouth every evening.    Yes [provider]  potassium chloride SA (K-DUR,KLOR-CON) 20 MEQ tablet Take 20 mEq by mouth 2 (two) times daily.   Yes [provider]  vitamin B-12 (CYANOCOBALAMIN) 1000 MCG tablet Take 1,000 mcg by mouth daily.   Yes [provider]     Physical Exam: Vitals:   03/23/17 0411 03/23/17 0445 03/23/17 0544 03/23/17 0655  BP: (!) 167/80 (!) 156/85 (!) 180/77 (!) 176/95  Pulse: 61 (!) 52 80 81  Resp: 16 16 16 14   Temp: 98 F (36.7 C)     TempSrc: Oral     SpO2: 96% 97% 94% 95%  Weight:      Height:            Vitals:  03/23/17 0411 03/23/17 0445 03/23/17 0544 03/23/17 0655  BP: (!) 167/80 (!) 156/85 (!) 180/77 (!) 176/95  Pulse: 61 (!) 52 80 81  Resp: 16 16 16 14   Temp: 98 F (36.7 C)     TempSrc: Oral     SpO2: 96% 97% 94% 95%  Weight:      Height:       Constitutional: NAD, calm, comfortable Eyes: PERRL, lids and conjunctivae normal ENMT: Mucous membranes are moist. Posterior pharynx clear of any exudate or lesions.Normal dentition.  Neck: normal, supple, no masses, no thyromegaly Respiratory: clear to auscultation bilaterally, no wheezing, no crackles. Normal respiratory effort. No accessory muscle use.  Cardiovascular: Regular rate and rhythm, no murmurs / rubs / gallops. No extremity edema. 2+ pedal pulses. No carotid bruits.  Abdomen: no tenderness, no masses palpated. No hepatosplenomegaly. Bowel sounds positive.  Musculoskeletal: no clubbing / cyanosis. No joint deformity upper and lower extremities. Good ROM, no contractures. Normal muscle tone.  Skin: no rashes, lesions, ulcers. No induration Neurologic: CN 2-12 grossly intact. Sensation intact, DTR normal. Strength 5/5 in all 4.  Psychiatric: Normal judgment and insight. Alert and oriented x 3. Normal mood.     Labs on Admission: I have personally reviewed  following labs and imaging studies  CBC:  Recent Labs Lab 03/23/17 0157  WBC 9.9  NEUTROABS 7.9*  HGB 15.9  HCT 45.5  MCV 87.3  PLT 017    Basic Metabolic Panel:  Recent Labs Lab 03/23/17 0157  NA 138  K 3.3*  CL 105  CO2 25  GLUCOSE 146*  BUN 23*  CREATININE 1.25*  CALCIUM 9.2    GFR: Estimated Creatinine Clearance: 57.2 mL/min (A) (by C-G formula based on SCr of 1.25 mg/dL (H)).  Liver Function Tests: No results for input(s): AST, ALT, ALKPHOS, BILITOT, PROT, ALBUMIN in the last 168 hours. No results for input(s): LIPASE, AMYLASE in the last 168 hours. No results for input(s): AMMONIA in the last 168 hours.  Coagulation Profile: No results for input(s): INR, PROTIME in the last 168 hours. No results for input(s): DDIMER in the last 72 hours.  Cardiac Enzymes: No results for input(s): CKTOTAL, CKMB, CKMBINDEX, TROPONINI in the last 168 hours.  BNP (last 3 results) No results for input(s): PROBNP in the last 8760 hours.  HbA1C: No results for input(s): HGBA1C in the last 72 hours. No results found for: HGBA1C   CBG: No results for input(s): GLUCAP in the last 168 hours.  Lipid Profile: No results for input(s): CHOL, HDL, LDLCALC, TRIG, CHOLHDL, LDLDIRECT in the last 72 hours.  Thyroid Function Tests: No results for input(s): TSH, T4TOTAL, FREET4, T3FREE, THYROIDAB in the last 72 hours.  Anemia Panel: No results for input(s): VITAMINB12, FOLATE, FERRITIN, TIBC, IRON, RETICCTPCT in the last 72 hours.  Urine analysis:    Component Value Date/Time   COLORURINE YELLOW 03/23/2017 0131   APPEARANCEUR HAZY (A) 03/23/2017 0131   LABSPEC >1.030 (H) 03/23/2017 0131   PHURINE 5.5 03/23/2017 0131   GLUCOSEU NEGATIVE 03/23/2017 0131   HGBUR LARGE (A) 03/23/2017 0131   BILIRUBINUR NEGATIVE 03/23/2017 0131   KETONESUR NEGATIVE 03/23/2017 0131   PROTEINUR 30 (A) 03/23/2017 0131   UROBILINOGEN 0.2 04/02/2013 1524   NITRITE NEGATIVE 03/23/2017 0131    LEUKOCYTESUR SMALL (A) 03/23/2017 0131    Sepsis Labs: @LABRCNTIP (procalcitonin:4,lacticidven:4) )No results found for this or any previous visit (from the past 240 hour(s)).       Radiological Exams on Admission: Dg Abdomen 1  View  Addendum Date: 03/23/2017   ADDENDUM REPORT: 03/23/2017 03:44 ADDENDUM: Additionally, there is a 6 mm rounded calcification projecting over left SI joint confirmed to be ureteral stone on subsequent CT. Electronically Signed   By: Ashley Royalty M.D.   On: 03/23/2017 03:44   Result Date: 03/23/2017 CLINICAL DATA:  Bilateral renal stones. Right flank pain starting 6 hours ago. EXAM: ABDOMEN - 1 VIEW COMPARISON:  07/07/2016 abdomen radiographs. FINDINGS: Clusters of calcifications are again noted of both kidneys projecting over the lower poles, the largest cluster on the right in aggregate is estimated at 2.6 x 1.4 cm and unchanged in appearance. The largest calcification noted on the left also projects over the lower pole measuring 1.9 x 1.2 cm. A previously seen 0.5 cm upper pole calcification is no longer apparent on the left. 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 No calcifications are identified along the course of the expected ureters. No bladder calculi are apparent. Bowel gas pattern is unremarkable apart from increased colonic stool burden within cecum and ascending colon to the hepatic flexure. IMPRESSION: 1. Bilateral stable lower pole renal calculi. No definite ureteral stones or bladder calcifications. 2. Increased stool burden within the cecum and ascending colon. 3. Levoscoliosis with lumbar spondylosis, stable in appearance. Electronically Signed: By: Ashley Royalty M.D. On: 03/23/2017 02:40   Ct Renal Stone Study  Result Date: 03/23/2017 CLINICAL DATA:  Right flank pain starting 7 hours ago. EXAM: CT ABDOMEN AND PELVIS WITHOUT CONTRAST TECHNIQUE: Multidetector CT imaging of the abdomen and pelvis was performed following the  standard protocol without IV contrast. COMPARISON:  Same day KUB FINDINGS: Lower chest: Normal size heart without pericardial effusion. Minimal pleural thickening at the left lung base. No pneumonic consolidation or pneumothorax. Hepatobiliary: No focal liver abnormality is seen given the limitations of a noncontrast study. No gallstones, gallbladder wall thickening, or biliary dilatation. Pancreas: Mild atrophy without focal mass or ductal dilatation. No inflammation. Spleen: Normal Adrenals/Urinary Tract: Normal bilateral adrenal glands. Left-sided perinephric fat stranding with mild hydroureteronephrosis attributable to a calculus measures 7 mm at the pelvic brim. No right-sided hydroureteronephrosis. Bilateral lower pole renal calculi are noted, the largest on the right measuring 1.7 x 1.1 cm adjacent to a 1.5 x 0.7 cm calculus. On the left, there is a lower pole 1.4 x 1.2 cm calculus all measured on the axial plane. 31.1 cm upper pole right renal cyst is noted. Stomach/Bowel: Normal appendix. The stomach, small intestine and large bowel are non acute. Scattered colonic diverticulosis at the junction of the descending sigmoid colon. No acute inflammation. Vascular/Lymphatic: Aortoiliac and branch vessel atherosclerosis. No aneurysm. No adenopathy. Reproductive: Normal size prostate. Other: No abdominopelvic ascites. Atrophy of the left oblique muscles. Musculoskeletal: Levoscoliosis and lumbar spondylosis. IMPRESSION: 1. 7 mm left ureteral stone with mild left-sided hydroureteronephrosis perinephric fat stranding. 2. Bilateral nonobstructing lower pole renal calculi as above. 3. Simple appearing 1 cm upper pole right renal cyst. 4. Left-sided colonic diverticulosis without acute diverticulitis. Electronically Signed   By: Ashley Royalty M.D.   On: 03/23/2017 03:42   Dg Abdomen 1 View  Addendum Date: 03/23/2017   ADDENDUM REPORT: 03/23/2017 03:44 ADDENDUM: Additionally, there is a 6 mm rounded calcification  projecting over left SI joint confirmed to be ureteral stone on subsequent CT. Electronically Signed   By: Ashley Royalty M.D.   On: 03/23/2017 03:44   Result Date: 03/23/2017 CLINICAL DATA:  Bilateral renal stones. Right flank pain starting 6 hours ago. EXAM:  ABDOMEN - 1 VIEW COMPARISON:  07/07/2016 abdomen radiographs. FINDINGS: Clusters of calcifications are again noted of both kidneys projecting over the lower poles, the largest cluster on the right in aggregate is estimated at 2.6 x 1.4 cm and unchanged in appearance. The largest calcification noted on the left also projects over the lower pole measuring 1.9 x 1.2 cm. A previously seen 0.5 cm upper pole calcification is no longer apparent on the left. 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 No calcifications are identified along the course of the expected ureters. No bladder calculi are apparent. Bowel gas pattern is unremarkable apart from increased colonic stool burden within cecum and ascending colon to the hepatic flexure. IMPRESSION: 1. Bilateral stable lower pole renal calculi. No definite ureteral stones or bladder calcifications. 2. Increased stool burden within the cecum and ascending colon. 3. Levoscoliosis with lumbar spondylosis, stable in appearance. Electronically Signed: By: Ashley Royalty M.D. On: 03/23/2017 02:40   Ct Renal Stone Study  Result Date: 03/23/2017 CLINICAL DATA:  Right flank pain starting 7 hours ago. EXAM: CT ABDOMEN AND PELVIS WITHOUT CONTRAST TECHNIQUE: Multidetector CT imaging of the abdomen and pelvis was performed following the standard protocol without IV contrast. COMPARISON:  Same day KUB FINDINGS: Lower chest: Normal size heart without pericardial effusion. Minimal pleural thickening at the left lung base. No pneumonic consolidation or pneumothorax. Hepatobiliary: No focal liver abnormality is seen given the limitations of a noncontrast study. No gallstones, gallbladder wall thickening, or  biliary dilatation. Pancreas: Mild atrophy without focal mass or ductal dilatation. No inflammation. Spleen: Normal Adrenals/Urinary Tract: Normal bilateral adrenal glands. Left-sided perinephric fat stranding with mild hydroureteronephrosis attributable to a calculus measures 7 mm at the pelvic brim. No right-sided hydroureteronephrosis. Bilateral lower pole renal calculi are noted, the largest on the right measuring 1.7 x 1.1 cm adjacent to a 1.5 x 0.7 cm calculus. On the left, there is a lower pole 1.4 x 1.2 cm calculus all measured on the axial plane. 31.1 cm upper pole right renal cyst is noted. Stomach/Bowel: Normal appendix. The stomach, small intestine and large bowel are non acute. Scattered colonic diverticulosis at the junction of the descending sigmoid colon. No acute inflammation. Vascular/Lymphatic: Aortoiliac and branch vessel atherosclerosis. No aneurysm. No adenopathy. Reproductive: Normal size prostate. Other: No abdominopelvic ascites. Atrophy of the left oblique muscles. Musculoskeletal: Levoscoliosis and lumbar spondylosis. IMPRESSION: 1. 7 mm left ureteral stone with mild left-sided hydroureteronephrosis perinephric fat stranding. 2. Bilateral nonobstructing lower pole renal calculi as above. 3. Simple appearing 1 cm upper pole right renal cyst. 4. Left-sided colonic diverticulosis without acute diverticulitis. Electronically Signed   By: Ashley Royalty M.D.   On: 03/23/2017 03:42      EKG: Independently reviewed.   Assessment/Plan Principal Problem:   Ureteral stone,Left ureteral calculus Seen by urology Plan is to proceed with left ureteral stent placement Neurology also recommends ureteroscope the lithotripsy a later date    Essential hypertension, benign-continue pindolol  Gout-continue allopurinol  Hypokalemia-replete and recheck, check magnesium     DVT prophylaxis:  Heparin     Code Status Orders full code       consults called:urology  Family Communication:  Admission, patients condition and plan of care including tests being ordered have been discussed with the patient  who indicates understanding and agree with the plan and Code Status  Admission status: inpatient   Disposition plan: Further plan will depend as patient's clinical course evolves and further radiologic and laboratory data become  available. Likely home when stable   At the time of admission, it appears that the appropriate admission status for this patient is INPATIENT .Thisis judged to be reasonable and necessary in order to provide the required intensity of service to ensure the patient's safetygiven thepresenting symptoms, physical exam findings, and initial radiographic and laboratory data in the context of their chronic comorbidities.   Reyne Dumas MD [facility] Pager [phone]  If 7PM-7AM, please contact night-coverage [email or web address] Password [facility]  03/23/2017, 8:51 AM

## 2017-03-23 NOTE — ED Notes (Signed)
C/o pain and nausea. Actively retching, orders received.

## 2017-03-23 NOTE — Op Note (Signed)
Operative Note  Preoperative diagnosis:  1. Left ureteral calculus  Postoperative diagnosis: 1. Eft ureteral calculus  Procedure(s): 1. Cystoscopy with left retrograde pyelogram and left ureteral stent placement  Surgeon: Link Snuffer, MD  Assistants:none  Anesthesia: Gen.  Complications: none immediate  EBL: none  Specimens: 1. none  Drains/Catheters: 1. 6 x 26 double-J ureteral stent  Intraoperative findings: cystoscopy revealed prostatic hyperplasia with large median lobe. Bladder mucosa was normal without any masses. Left retrograde pyelogram revealed a filling defect at the mid to distal ureter with upstream hydroureteronephrosis.  Indication: this is a 76 year old male with a long history of nephrolithiasis. He presented to an outside [place] with left flank pain was found to have a 7 mm ureteral calculus. Due to uncontrolled pain he was transferred over here. Due to concern for possible bacteriuria on urinalysis, and the decision was made to proceed with ureteral stent  Description of procedure:  The patient was identified and consent was obtained. He was taken to the operating room placed in supine position. He was placed under general anesthesia. His converted dorsal lithotomy. He was prepped and draped standard sterile fashion. Timeout was performed. A 21 French cystoscope was advanced into the urethra and into the bladder and a complete cystoscopy was performed. The left ureter was cannulated in the distal portion with a open-ended ureteral catheter and retrograde pyelogram was performed. The findings are noted above. A sensor wire was advanced up the ureter and into the kidney under fluoroscopic guidance. A 6 x 26 double-J ureteral stent was then advanced over the wire and the wire withdrawn. Fluoroscopy confirmed proximal placement.Direct visualizationconfirmed a coil within the bladder. The bladder was drained and scope withdrawn and this concluded the  operation. The patient tolerated procedure well and was stable postoperatively.  Plan:patient will likely stay overnight but if he does well postoperatively and feels well but he can possibly be discharged this evening. He is stable from a urological standpoint. Would recommend switching to a by mouth antibiotic such as ciprofloxacin.he'll follow up in clinic for scheduling of left ureteroscopy laser lithotripsy and ureteral stent exchange.

## 2017-03-23 NOTE — Plan of Care (Signed)
Problem: Pain Managment: Goal: General experience of comfort will improve Outcome: Completed/Met Date Met: 03/23/17 Patient c/o pain 4/10 on admit worsening 6/10. Pain medication given. Pt reports pain as being managed at this time. Education provided regarding interventions

## 2017-03-23 NOTE — Consult Note (Signed)
H&P Physician requesting consult: Veryl Speak, MD  Chief Complaint: left ureteral calculus  History of Present Illness: this is a 76 year old male with a long history of nephrolithiasis.who presented with left-sided flank pain and outside emergency department and CT scan revealed a 7 mm left ureteral calculus, 2 large right lower pole renal calculi, and one large left lower pole renal calculus. Given the refractory pain he was transferred over to this hospital for care. Creatinine is 1.5, white blood cell count 9.9. However urinalysis shows small leukocytes, few bacteria, and too numerous to count WBCs. He denies any fevers chills nausea or vomiting. His pain has improved some with IV pain medication.  Past Medical History:  Diagnosis Date  . Arthritis   . Asthma    15-20 yrs ago  . Dry skin   . Frequency of urination   . GERD (gastroesophageal reflux disease)    NO ISSUES WHEN USING CPAP--  NO MEDS  . Goiter   . H/O hiatal hernia   . History of kidney stones   . Hypertension   . Left knee DJD   . Left ureteral calculus   . Nocturia   . OSA on CPAP    cpap does not know settings   . Urgency of urination   . URI (upper respiratory infection) 11/29/12    see office visit note in chart placed on Zpack   Past Surgical History:  Procedure Laterality Date  . CARPAL TUNNEL RELEASE Bilateral [place]   . CATARACT EXTRACTION W/ INTRAOCULAR LENS  IMPLANT, BILATERAL  2005  &  2012  . EXTRACORPOREAL SHOCK WAVE LITHOTRIPSY     MULTIPLE  . HEMIARTHROPLASTY RIGHT SHOULDER  2007  . HOLMIUM LASER APPLICATION Left 2/54/2706   Procedure: HOLMIUM LASER APPLICATION;  Surgeon: Fredricka Bonine, MD;  Location: [facility];  Service: Urology;  Laterality: Left;  . [place]   removal of stones. (OPEN PROCUDURE)  . REVERSE SHOULDER ARTHROPLASTY  10/09/2011   Procedure: REVERSE SHOULDER ARTHROPLASTY;  Surgeon: Marin Shutter, MD;  Location: [place];   Service: Orthopedics;  Laterality: Right;  right hardware removal and reverse shoulder arthroplasty  . THYROID LOBECTOMY  1967   goiter removed  . TONSILLECTOMY    . TOTAL KNEE ARTHROPLASTY  07/22/2012   Procedure: TOTAL KNEE ARTHROPLASTY;  Surgeon: Johnn Hai, MD;  Location: [facility];  Service: Orthopedics;  Laterality: Right;  . TOTAL KNEE ARTHROPLASTY Left 12/16/2012   Procedure: LEFT TOTAL KNEE ARTHROPLASTY;  Surgeon: Johnn Hai, MD;  Location: [facility];  Service: Orthopedics;  Laterality: Left;  . TRIGGER FINGER RELEASE Right 2006  . URETEROSOPIC STONE EXTRACTION  2002    Home Medications:   (Not in a hospital admission) Allergies:  Allergies  Allergen Reactions  . Naprosyn [Naproxen] Other (See Comments)    "makes me jittery/nervous"    Family History  Problem Relation Age of Onset  . Dementia Mother   . Dementia Father   . Anesthesia problems Neg Hx   . Hypotension Neg Hx   . Malignant hyperthermia Neg Hx   . Pseudochol deficiency Neg Hx    Social History:  reports that he has never smoked. He has never used smokeless tobacco. He reports that he does not drink alcohol or use drugs.  ROS: A complete review of systems was performed.  All systems are negative except for pertinent findings as noted. ROS   Physical Exam:  Vital signs  in last 24 hours: Temp:  [98 F (36.7 C)-98.2 F (36.8 C)] 98 F (36.7 C) (09/24 0411) Pulse Rate:  [45-142] 81 (09/24 0655) Resp:  [14-24] 14 (09/24 0655) BP: (156-204)/(69-95) 176/95 (09/24 0655) SpO2:  [91 %-97 %] 95 % (09/24 0655) Weight:  [95.3 kg (210 lb)] 95.3 kg (210 lb) (09/24 0124) General:  Alert and oriented, No acute distress HEENT: Normocephalic, atraumatic Neck: No JVD or lymphadenopathy Cardiovascular: Regular rate and rhythm Lungs: Regular rate and effort Abdomen: Soft, nontender, nondistended, no abdominal masses Back: No CVA tenderness Extremities: No edema Neurologic: Grossly intact  Laboratory Data:   Results for orders placed or performed during the hospital encounter of 03/23/17 (from the past 24 hour(s))  Urinalysis, Routine w reflex microscopic     Status: Abnormal   Collection Time: 03/23/17  1:31 AM  Result Value Ref Range   Color, Urine YELLOW YELLOW   APPearance HAZY (A) CLEAR   Specific Gravity, Urine >1.030 (H) 1.005 - 1.030   pH 5.5 5.0 - 8.0   Glucose, UA NEGATIVE NEGATIVE mg/dL   Hgb urine dipstick LARGE (A) NEGATIVE   Bilirubin Urine NEGATIVE NEGATIVE   Ketones, ur NEGATIVE NEGATIVE mg/dL   Protein, ur 30 (A) NEGATIVE mg/dL   Nitrite NEGATIVE NEGATIVE   Leukocytes, UA SMALL (A) NEGATIVE  Urinalysis, Microscopic (reflex)     Status: Abnormal   Collection Time: 03/23/17  1:31 AM  Result Value Ref Range   RBC / HPF TOO NUMEROUS TO COUNT 0 - 5 RBC/hpf   WBC, UA TOO NUMEROUS TO COUNT 0 - 5 WBC/hpf   Bacteria, UA FEW (A) NONE SEEN   Squamous Epithelial / LPF 0-5 (A) NONE SEEN   Mucus PRESENT    Hyaline Casts, UA PRESENT   CBC with Differential     Status: Abnormal   Collection Time: 03/23/17  1:57 AM  Result Value Ref Range   WBC 9.9 4.0 - 10.5 K/uL   RBC 5.21 4.22 - 5.81 MIL/uL   Hemoglobin 15.9 13.0 - 17.0 g/dL   HCT 45.5 39.0 - 52.0 %   MCV 87.3 78.0 - 100.0 fL   MCH 30.5 26.0 - 34.0 pg   MCHC 34.9 30.0 - 36.0 g/dL   RDW 14.4 11.5 - 15.5 %   Platelets 260 150 - 400 K/uL   Neutrophils Relative % 78 %   Neutro Abs 7.9 (H) 1.7 - 7.7 K/uL   Lymphocytes Relative 12 %   Lymphs Abs 1.1 0.7 - 4.0 K/uL   Monocytes Relative 6 %   Monocytes Absolute 0.6 0.1 - 1.0 K/uL   Eosinophils Relative 3 %   Eosinophils Absolute 0.3 0.0 - 0.7 K/uL   Basophils Relative 1 %   Basophils Absolute 0.1 0.0 - 0.1 K/uL  Basic metabolic panel     Status: Abnormal   Collection Time: 03/23/17  1:57 AM  Result Value Ref Range   Sodium 138 135 - 145 mmol/L   Potassium 3.3 (L) 3.5 - 5.1 mmol/L   Chloride 105 101 - 111 mmol/L   CO2 25 22 - 32 mmol/L   Glucose, Bld 146 (H) 65 - 99 mg/dL    BUN 23 (H) 6 - 20 mg/dL   Creatinine, Ser 1.25 (H) 0.61 - 1.24 mg/dL   Calcium 9.2 8.9 - 10.3 mg/dL   GFR calc non Af Amer 54 (L) >60 mL/min   GFR calc Af Amer >60 >60 mL/min   Anion gap 8 5 - 15   No  results found for this or any previous visit (from the past 240 hour(s)). Creatinine:  Recent Labs  03/23/17 0157  CREATININE 1.25*    Impression/Assessment:  Left ureteral calculus  Plan:  Giving his urinalysis with possible bacteriuria,we will proceed with left ureteral stent placement. He will subsequently be scheduled for left ureteroscopy with laser lithotripsy at a later date. Recommend antibiotics such as ciprofloxacin. He understands potential risks including but not limited to bleeding, infection, and injury to surrounding structures.  Marton Redwood, III 03/23/2017, 7:35 AM

## 2017-03-23 NOTE — H&P (View-Only) (Signed)
H&P Physician requesting consult: Veryl Speak, MD  Chief Complaint: left ureteral calculus  History of Present Illness: this is a 76 year old male with a long history of nephrolithiasis.who presented with left-sided flank pain and outside emergency department and CT scan revealed a 7 mm left ureteral calculus, 2 large right lower pole renal calculi, and one large left lower pole renal calculus. Given the refractory pain he was transferred over to this hospital for care. Creatinine is 1.5, white blood cell count 9.9. However urinalysis shows small leukocytes, few bacteria, and too numerous to count WBCs. He denies any fevers chills nausea or vomiting. His pain has improved some with IV pain medication.  Past Medical History:  Diagnosis Date  . Arthritis   . Asthma    15-20 yrs ago  . Dry skin   . Frequency of urination   . GERD (gastroesophageal reflux disease)    NO ISSUES WHEN USING CPAP--  NO MEDS  . Goiter   . H/O hiatal hernia   . History of kidney stones   . Hypertension   . Left knee DJD   . Left ureteral calculus   . Nocturia   . OSA on CPAP    cpap does not know settings   . Urgency of urination   . URI (upper respiratory infection) 11/29/12    see office visit note in chart placed on Zpack   Past Surgical History:  Procedure Laterality Date  . CARPAL TUNNEL RELEASE Bilateral [place]   . CATARACT EXTRACTION W/ INTRAOCULAR LENS  IMPLANT, BILATERAL  2005  &  2012  . EXTRACORPOREAL SHOCK WAVE LITHOTRIPSY     MULTIPLE  . HEMIARTHROPLASTY RIGHT SHOULDER  2007  . HOLMIUM LASER APPLICATION Left 0/25/4270   Procedure: HOLMIUM LASER APPLICATION;  Surgeon: Fredricka Bonine, MD;  Location: [facility];  Service: Urology;  Laterality: Left;  . [place]   removal of stones. (OPEN PROCUDURE)  . REVERSE SHOULDER ARTHROPLASTY  10/09/2011   Procedure: REVERSE SHOULDER ARTHROPLASTY;  Surgeon: Marin Shutter, MD;  Location: [place];   Service: Orthopedics;  Laterality: Right;  right hardware removal and reverse shoulder arthroplasty  . THYROID LOBECTOMY  1967   goiter removed  . TONSILLECTOMY    . TOTAL KNEE ARTHROPLASTY  07/22/2012   Procedure: TOTAL KNEE ARTHROPLASTY;  Surgeon: Johnn Hai, MD;  Location: [facility];  Service: Orthopedics;  Laterality: Right;  . TOTAL KNEE ARTHROPLASTY Left 12/16/2012   Procedure: LEFT TOTAL KNEE ARTHROPLASTY;  Surgeon: Johnn Hai, MD;  Location: [facility];  Service: Orthopedics;  Laterality: Left;  . TRIGGER FINGER RELEASE Right 2006  . URETEROSOPIC STONE EXTRACTION  2002    Home Medications:   (Not in a hospital admission) Allergies:  Allergies  Allergen Reactions  . Naprosyn [Naproxen] Other (See Comments)    "makes me jittery/nervous"    Family History  Problem Relation Age of Onset  . Dementia Mother   . Dementia Father   . Anesthesia problems Neg Hx   . Hypotension Neg Hx   . Malignant hyperthermia Neg Hx   . Pseudochol deficiency Neg Hx    Social History:  reports that he has never smoked. He has never used smokeless tobacco. He reports that he does not drink alcohol or use drugs.  ROS: A complete review of systems was performed.  All systems are negative except for pertinent findings as noted. ROS   Physical Exam:  Vital signs  in last 24 hours: Temp:  [98 F (36.7 C)-98.2 F (36.8 C)] 98 F (36.7 C) (09/24 0411) Pulse Rate:  [45-142] 81 (09/24 0655) Resp:  [14-24] 14 (09/24 0655) BP: (156-204)/(69-95) 176/95 (09/24 0655) SpO2:  [91 %-97 %] 95 % (09/24 0655) Weight:  [95.3 kg (210 lb)] 95.3 kg (210 lb) (09/24 0124) General:  Alert and oriented, No acute distress HEENT: Normocephalic, atraumatic Neck: No JVD or lymphadenopathy Cardiovascular: Regular rate and rhythm Lungs: Regular rate and effort Abdomen: Soft, nontender, nondistended, no abdominal masses Back: No CVA tenderness Extremities: No edema Neurologic: Grossly intact  Laboratory Data:   Results for orders placed or performed during the hospital encounter of 03/23/17 (from the past 24 hour(s))  Urinalysis, Routine w reflex microscopic     Status: Abnormal   Collection Time: 03/23/17  1:31 AM  Result Value Ref Range   Color, Urine YELLOW YELLOW   APPearance HAZY (A) CLEAR   Specific Gravity, Urine >1.030 (H) 1.005 - 1.030   pH 5.5 5.0 - 8.0   Glucose, UA NEGATIVE NEGATIVE mg/dL   Hgb urine dipstick LARGE (A) NEGATIVE   Bilirubin Urine NEGATIVE NEGATIVE   Ketones, ur NEGATIVE NEGATIVE mg/dL   Protein, ur 30 (A) NEGATIVE mg/dL   Nitrite NEGATIVE NEGATIVE   Leukocytes, UA SMALL (A) NEGATIVE  Urinalysis, Microscopic (reflex)     Status: Abnormal   Collection Time: 03/23/17  1:31 AM  Result Value Ref Range   RBC / HPF TOO NUMEROUS TO COUNT 0 - 5 RBC/hpf   WBC, UA TOO NUMEROUS TO COUNT 0 - 5 WBC/hpf   Bacteria, UA FEW (A) NONE SEEN   Squamous Epithelial / LPF 0-5 (A) NONE SEEN   Mucus PRESENT    Hyaline Casts, UA PRESENT   CBC with Differential     Status: Abnormal   Collection Time: 03/23/17  1:57 AM  Result Value Ref Range   WBC 9.9 4.0 - 10.5 K/uL   RBC 5.21 4.22 - 5.81 MIL/uL   Hemoglobin 15.9 13.0 - 17.0 g/dL   HCT 45.5 39.0 - 52.0 %   MCV 87.3 78.0 - 100.0 fL   MCH 30.5 26.0 - 34.0 pg   MCHC 34.9 30.0 - 36.0 g/dL   RDW 14.4 11.5 - 15.5 %   Platelets 260 150 - 400 K/uL   Neutrophils Relative % 78 %   Neutro Abs 7.9 (H) 1.7 - 7.7 K/uL   Lymphocytes Relative 12 %   Lymphs Abs 1.1 0.7 - 4.0 K/uL   Monocytes Relative 6 %   Monocytes Absolute 0.6 0.1 - 1.0 K/uL   Eosinophils Relative 3 %   Eosinophils Absolute 0.3 0.0 - 0.7 K/uL   Basophils Relative 1 %   Basophils Absolute 0.1 0.0 - 0.1 K/uL  Basic metabolic panel     Status: Abnormal   Collection Time: 03/23/17  1:57 AM  Result Value Ref Range   Sodium 138 135 - 145 mmol/L   Potassium 3.3 (L) 3.5 - 5.1 mmol/L   Chloride 105 101 - 111 mmol/L   CO2 25 22 - 32 mmol/L   Glucose, Bld 146 (H) 65 - 99 mg/dL    BUN 23 (H) 6 - 20 mg/dL   Creatinine, Ser 1.25 (H) 0.61 - 1.24 mg/dL   Calcium 9.2 8.9 - 10.3 mg/dL   GFR calc non Af Amer 54 (L) >60 mL/min   GFR calc Af Amer >60 >60 mL/min   Anion gap 8 5 - 15   No  results found for this or any previous visit (from the past 240 hour(s)). Creatinine:  Recent Labs  03/23/17 0157  CREATININE 1.25*    Impression/Assessment:  Left ureteral calculus  Plan:  Giving his urinalysis with possible bacteriuria,we will proceed with left ureteral stent placement. He will subsequently be scheduled for left ureteroscopy with laser lithotripsy at a later date. Recommend antibiotics such as ciprofloxacin. He understands potential risks including but not limited to bleeding, infection, and injury to surrounding structures.  Marton Redwood, III 03/23/2017, 7:35 AM

## 2017-03-23 NOTE — Anesthesia Postprocedure Evaluation (Signed)
Anesthesia Post Note  Patient: Darrell Perez  Procedure(s) Performed: Procedure(s) (LRB): CYSTOSCOPY, LEFT RETROGRADE WITH STENT PLACEMENT (Left)     Patient location during evaluation: PACU Anesthesia Type: General Level of consciousness: awake and alert Pain management: pain level controlled Vital Signs Assessment: post-procedure vital signs reviewed and stable Respiratory status: spontaneous breathing, nonlabored ventilation and respiratory function stable Cardiovascular status: blood pressure returned to baseline and stable Postop Assessment: no apparent nausea or vomiting Anesthetic complications: no    Last Vitals:  Vitals:   03/23/17 1445 03/23/17 1500  BP: (!) 119/54 (!) 127/57  Pulse: (!) 52 (!) 52  Resp: 12 12  Temp:    SpO2: 100% 100%    Last Pain:  Vitals:   03/23/17 1500  TempSrc:   PainSc: 0-No pain                 Audry Pili

## 2017-03-23 NOTE — Interval H&P Note (Signed)
History and Physical Interval Note:  03/23/2017 10:02 AM  Darrell Perez  has presented today for surgery, with the diagnosis of left ureteral stone  The various methods of treatment have been discussed with the patient and family. After consideration of risks, benefits and other options for treatment, the patient has consented to  Procedure(s): [place] (Left) as a surgical intervention .  The patient's history has been reviewed, patient examined, no change in status, stable for surgery.  I have reviewed the patient's chart and labs.  Questions were answered to the patient's satisfaction.     Marton Redwood, III

## 2017-03-23 NOTE — Progress Notes (Addendum)
CRITICAL VALUE ALERT  Critical Value:  Troponin 0.03  Date & Time Notied:  03/23/17 1912  Provider Notified: 1912, schorr  Orders Received/Actions taken: awaiting call back

## 2017-03-23 NOTE — Transfer of Care (Signed)
Immediate Anesthesia Transfer of Care Note  Patient: Darrell Perez  Procedure(s) Performed: Procedure(s): CYSTOSCOPY, LEFT RETROGRADE WITH STENT PLACEMENT (Left)  Patient Location: PACU  Anesthesia Type:General  Level of Consciousness:  sedated, patient cooperative and responds to stimulation  Airway & Oxygen Therapy:Patient Spontanous Breathing and Patient connected to face mask oxgen  Post-op Assessment:  Report given to PACU RN and Post -op Vital signs reviewed and stable  Post vital signs:  Reviewed and stable  Last Vitals:  Vitals:   03/23/17 0544 03/23/17 0655  BP: (!) 180/77 (!) 176/95  Pulse: 80 81  Resp: 16 14  Temp:    SpO2: 29% 19%    Complications: No apparent anesthesia complications

## 2017-03-23 NOTE — ED Notes (Signed)
Pt transported to [facility] ED. Stable at transfer

## 2017-03-23 NOTE — ED Notes (Addendum)
Pt Sinus Brady/SR on tele 48-72.  Irregular beats noted at times.  MD aware and no orders received. Pt denies any heart history or any CP/Sob.

## 2017-03-23 NOTE — ED Provider Notes (Signed)
Patient is a 76 year old male transferred to the [facility] emergency department from [facility] for evaluation of a kidney stone. He was found to have a 7 mm ureteral stone with mild left-sided hydroureteronephrosis and perinephric stranding. He has no fever or white count.  He remains comfortable after arriving here. I've spoken with Dr. Gloriann Loan from urology who is requesting the patient be admitted to the hospitalist service.   Veryl Speak, MD 03/23/17 [phone]

## 2017-03-23 NOTE — ED Triage Notes (Addendum)
C/o left flank/lower back pain that started at 8pm. States pain comes and goes and describes as sharp. States pain was constant but has improved some. Denies any urinary symptoms. C/o n/v pta. History of kidney stones

## 2017-03-23 NOTE — ED Provider Notes (Signed)
[facility] Provider Note   CSN: [id number] Arrival date & time: 03/23/17  0116     History   Chief Complaint Chief Complaint  Patient presents with  . Flank Pain    HPI Darrell Perez is a 76 y.o. male.  HPI  This is a 76 year old male with a history of kidney stones, hypertension who presents with left flank pain. Onset of symptoms 8 PM. Patient describes pain as sharp and nonradiating. It is constant but the intensity of the pain waxes and wanes. Current pain is 8 out of 10. He took Norco at home with minimal relief. Denies hematuria or dysuria. Does report nausea and vomiting. Pain is similar to prior kidney stones. Denies any fevers. Reports multiple prior interventions in the past and history of large stones.  Past Medical History:  Diagnosis Date  . Arthritis   . Asthma    15-20 yrs ago  . Dry skin   . Frequency of urination   . GERD (gastroesophageal reflux disease)    NO ISSUES WHEN USING CPAP--  NO MEDS  . Goiter   . H/O hiatal hernia   . History of kidney stones   . Hypertension   . Left knee DJD   . Left ureteral calculus   . Nocturia   . OSA on CPAP    cpap does not know settings   . Urgency of urination   . URI (upper respiratory infection) 11/29/12    see office visit note in chart placed on Zpack    Patient Active Problem List   Diagnosis Date Noted  . BPH (benign prostatic hypertrophy) 02/03/2013  . Extrinsic asthma, unspecified 01/27/2013  . Essential hypertension, benign 01/27/2013  . Gout, unspecified 01/27/2013  . 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 CARPAL TUNNEL RELEASE Bilateral [place]   . CATARACT EXTRACTION W/ INTRAOCULAR LENS  IMPLANT, BILATERAL  2005  &  2012  . EXTRACORPOREAL SHOCK WAVE LITHOTRIPSY     MULTIPLE  . HEMIARTHROPLASTY RIGHT SHOULDER  2007  . HOLMIUM LASER APPLICATION Left 2/95/2841   Procedure: HOLMIUM LASER APPLICATION;   Surgeon: Fredricka Bonine, MD;  Location: [facility];  Service: Urology;  Laterality: Left;  . Trainer   removal of stones. (OPEN PROCUDURE)  . REVERSE SHOULDER ARTHROPLASTY  10/09/2011   Procedure: REVERSE SHOULDER ARTHROPLASTY;  Surgeon: Marin Shutter, MD;  Location: [facility];  Service: Orthopedics;  Laterality: Right;  right hardware removal and reverse shoulder arthroplasty  . THYROID LOBECTOMY  1967   goiter removed  . TONSILLECTOMY    . TOTAL KNEE ARTHROPLASTY  07/22/2012   Procedure: TOTAL KNEE ARTHROPLASTY;  Surgeon: Johnn Hai, MD;  Location: [facility];  Service: Orthopedics;  Laterality: Right;  . TOTAL KNEE ARTHROPLASTY Left 12/16/2012   Procedure: LEFT TOTAL KNEE ARTHROPLASTY;  Surgeon: Johnn Hai, MD;  Location: [facility];  Service: Orthopedics;  Laterality: Left;  . TRIGGER FINGER RELEASE Right 2006  . [place] EXTRACTION  2002       Home Medications    Prior to Admission medications   Medication Sig Start Date End Date Taking? Authorizing Provider  HYDROcodone-acetaminophen (NORCO/VICODIN) 5-325 MG tablet Take 1 tablet by mouth every 6 (six) hours as needed for moderate pain.   Yes [provider]  acetaminophen (TYLENOL) 500 MG tablet Take 500 mg by  mouth every 6 (six) hours as needed for pain.    [provider]  allopurinol (ZYLOPRIM) 100 MG tablet Take 100 mg by mouth daily at 12 noon.     [provider]  amoxicillin (AMOXIL) 500 MG capsule Take 2,000 mg by mouth daily as needed. For dental procedure    [provider]  aspirin EC 81 MG tablet Take 81 mg by mouth daily.    [provider]  cetirizine (ZYRTEC) 10 MG tablet Take 10 mg by mouth daily. [facility] version of Zyrtec    [provider]  cholecalciferol (VITAMIN D) 1000 UNITS tablet Take 1,000 Units by mouth daily.    [provider]  ciprofloxacin (CIPRO) 500 MG tablet Take 1 tablet (500 mg  total) by mouth 2 (two) times daily. One po bid x 14 days 04/02/13   Riki Altes, MD  hydrocortisone cream 1 % Apply 1 application topically 2 (two) times daily as needed. For itching    [provider]  indapamide (LOZOL) 2.5 MG tablet Take 2.5 mg by mouth every evening.     [provider]  methocarbamol (ROBAXIN) 500 MG tablet Take 500 mg by mouth daily as needed (for muscle spasm).  07/22/12   Cecilie Kicks, PA-C  metoCLOPramide (REGLAN) 10 MG tablet Take 1 tablet (10 mg total) by mouth every 6 (six) hours as needed (nausea/headache). 04/02/13   Riki Altes, MD  metroNIDAZOLE (FLAGYL) 500 MG tablet Take 1 tablet (500 mg total) by mouth 2 (two) times daily. One po bid x 14 days 04/02/13   Riki Altes, MD  ondansetron (ZOFRAN ODT) 8 MG disintegrating tablet 8mg  ODT q4 hours prn nausea 04/02/13   Riki Altes, MD  oxyCODONE-acetaminophen (ROXICET) 5-325 MG per tablet Take 1-2 tablets by mouth every 4 (four) hours as needed for pain. 12/16/12   Cecilie Kicks, PA-C  pantoprazole (PROTONIX) 40 MG tablet Take 40 mg by mouth as needed (for indigestion).     [provider]  pindolol (VISKEN) 5 MG tablet Take 5 mg by mouth every evening.     [provider]  potassium chloride SA (K-DUR,KLOR-CON) 20 MEQ tablet Take 20 mEq by mouth 2 (two) times daily.    [provider]    Family History Family History  Problem Relation Age of Onset  . Dementia Mother   . Dementia Father   . Anesthesia problems Neg Hx   . Hypotension Neg Hx   . Malignant hyperthermia Neg Hx   . Pseudochol deficiency Neg Hx     Social History Social History  Substance Use Topics  . Smoking status: Never Smoker  . Smokeless tobacco: Never Used  . Alcohol use No     Allergies   Naprosyn [naproxen]   Review of Systems Review of Systems  Constitutional: Negative for fever.  Respiratory: Negative for shortness of breath.   Cardiovascular: Negative for chest pain.    Gastrointestinal: Positive for nausea and vomiting.  Genitourinary: Positive for flank pain. Negative for dysuria and hematuria.  All other systems reviewed and are negative.    Physical Exam Updated Vital Signs BP (!) 173/83   Pulse 79   Temp 98.2 F (36.8 C)   Resp (!) 23   Ht 5\' 9"  (1.753 m)   Wt 95.3 kg (210 lb)   SpO2 91%   BMI 31.01 kg/m   Physical Exam  Constitutional: He is oriented to person, place, and time. No distress.  Elderly, no acute distress,  uncomfortable appearing  HENT:  Head: Normocephalic and atraumatic.  Cardiovascular: Normal rate, regular rhythm and normal heart sounds.   No murmur heard. Pulmonary/Chest: Effort normal and breath sounds normal. No respiratory distress. He has no wheezes.  Abdominal: Soft. Bowel sounds are normal. There is no tenderness. There is no rebound.  Genitourinary:  Genitourinary Comments: No CVA tenderness  Neurological: He is alert and oriented to person, place, and time.  Skin: Skin is warm and dry.  Psychiatric: He has a normal mood and affect.  Nursing note and vitals reviewed.    ED Treatments / Results  Labs (all labs ordered are listed, but only abnormal results are displayed) Labs Reviewed  URINALYSIS, ROUTINE W REFLEX MICROSCOPIC - Abnormal; Notable for the following:       Result Value   APPearance HAZY (*)    Specific Gravity, Urine >1.030 (*)    Hgb urine dipstick LARGE (*)    Protein, ur 30 (*)    Leukocytes, UA SMALL (*)    All other components within normal limits  CBC WITH DIFFERENTIAL/PLATELET - Abnormal; Notable for the following:    Neutro Abs 7.9 (*)    All other components within normal limits  BASIC METABOLIC PANEL - Abnormal; Notable for the following:    Potassium 3.3 (*)    Glucose, Bld 146 (*)    BUN 23 (*)    Creatinine, Ser 1.25 (*)    GFR calc non Af Amer 54 (*)    All other components within normal limits  URINALYSIS, MICROSCOPIC (REFLEX) - Abnormal; Notable for the following:     Bacteria, UA FEW (*)    Squamous Epithelial / LPF 0-5 (*)    All other components within normal limits  URINE CULTURE    EKG  EKG Interpretation None       Radiology Dg Abdomen 1 View  Addendum Date: 03/23/2017   ADDENDUM REPORT: 03/23/2017 03:44 ADDENDUM: Additionally, there is a 6 mm rounded calcification projecting over left SI joint confirmed to be ureteral stone on subsequent CT. Electronically Signed   By: Ashley Royalty M.D.   On: 03/23/2017 03:44   Result Date: 03/23/2017 CLINICAL DATA:  Bilateral renal stones. Right flank pain starting 6 hours ago. EXAM: ABDOMEN - 1 VIEW COMPARISON:  07/07/2016 abdomen radiographs. FINDINGS: Clusters of calcifications are again noted of both kidneys projecting over the lower poles, the largest cluster on the right in aggregate is estimated at 2.6 x 1.4 cm and unchanged in appearance. The largest calcification noted on the left also projects over the lower pole measuring 1.9 x 1.2 cm. A previously seen 0.5 cm upper pole calcification is no longer apparent on the left. 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 No calcifications are identified along the course of the expected ureters. No bladder calculi are apparent. Bowel gas pattern is unremarkable apart from increased colonic stool burden within cecum and ascending colon to the hepatic flexure. IMPRESSION: 1. Bilateral stable lower pole renal calculi. No definite ureteral stones or bladder calcifications. 2. Increased stool burden within the cecum and ascending colon. 3. Levoscoliosis with lumbar spondylosis, stable in appearance. Electronically Signed: By: Ashley Royalty M.D. On: 03/23/2017 02:40   Ct Renal Stone Study  Result Date: 03/23/2017 CLINICAL DATA:  Right flank pain starting 7 hours ago. EXAM: CT ABDOMEN AND PELVIS WITHOUT CONTRAST TECHNIQUE: Multidetector CT imaging of the abdomen and pelvis was performed following the standard protocol without IV contrast.  COMPARISON:  Same day KUB  FINDINGS: Lower chest: Normal size heart without pericardial effusion. Minimal pleural thickening at the left lung base. No pneumonic consolidation or pneumothorax. Hepatobiliary: No focal liver abnormality is seen given the limitations of a noncontrast study. No gallstones, gallbladder wall thickening, or biliary dilatation. Pancreas: Mild atrophy without focal mass or ductal dilatation. No inflammation. Spleen: Normal Adrenals/Urinary Tract: Normal bilateral adrenal glands. Left-sided perinephric fat stranding with mild hydroureteronephrosis attributable to a calculus measures 7 mm at the pelvic brim. No right-sided hydroureteronephrosis. Bilateral lower pole renal calculi are noted, the largest on the right measuring 1.7 x 1.1 cm adjacent to a 1.5 x 0.7 cm calculus. On the left, there is a lower pole 1.4 x 1.2 cm calculus all measured on the axial plane. 31.1 cm upper pole right renal cyst is noted. Stomach/Bowel: Normal appendix. The stomach, small intestine and large bowel are non acute. Scattered colonic diverticulosis at the junction of the descending sigmoid colon. No acute inflammation. Vascular/Lymphatic: Aortoiliac and branch vessel atherosclerosis. No aneurysm. No adenopathy. Reproductive: Normal size prostate. Other: No abdominopelvic ascites. Atrophy of the left oblique muscles. Musculoskeletal: Levoscoliosis and lumbar spondylosis. IMPRESSION: 1. 7 mm left ureteral stone with mild left-sided hydroureteronephrosis perinephric fat stranding. 2. Bilateral nonobstructing lower pole renal calculi as above. 3. Simple appearing 1 cm upper pole right renal cyst. 4. Left-sided colonic diverticulosis without acute diverticulitis. Electronically Signed   By: Ashley Royalty M.D.   On: 03/23/2017 03:42    Procedures Procedures (including critical care time)  Medications Ordered in ED Medications  morphine 4 MG/ML injection 4 mg (4 mg Intravenous Given 03/23/17 0207)  ondansetron  (ZOFRAN) injection 4 mg (4 mg Intravenous Given 03/23/17 0207)  sodium chloride 0.9 % bolus 1,000 mL (0 mLs Intravenous Stopped 03/23/17 0302)  HYDROmorphone (DILAUDID) injection 1 mg (1 mg Intravenous Given 03/23/17 0302)  ondansetron (ZOFRAN) injection 4 mg (4 mg Intravenous Given 03/23/17 0301)     Initial Impression / Assessment and Plan / ED Course  I have reviewed the triage vital signs and the nursing notes.  Pertinent labs & imaging results that were available during my care of the patient were reviewed by me and considered in my medical decision making (see chart for details).     Patient presents with pain consistent with prior kidney stones. He is nontoxic but uncomfortable appearing. Vital signs reassuring. Discussed with patient workup. Initially lab work and KUB obtained. Patient was given fluids, morphine, and Zofran. Lab work notable for mild a Creatinine of 1.25. Baseline 1. No significant leukocytosis. Urinalysis does have too numerous to count white cells and red cells. Patient denies any urinary symptoms. Denies fevers. Urine culture sent. No evidence of pyelonephritis or sepsis at this time; however, will cover with Rocephin pending culture as the urine appears to be a fairly clean catch.  KUB shows no obvious obstructing stones. On multiple rechecks, patient reports persistent pain. He was redosed Dilaudid. Do not feel patient is a good candidate for Toradol this time given slightly reduced GFR. CT stone study obtained. Patient has an obstructing 7 mm stone on the left. Given his persistent and refractory pain, patient was discussed with Dr. Link Snuffer, urology. Options were discussed with the patient including further attempted pain control and close follow-up this morning in the office versus transfer to [facility] long for evaluation. Patient prefers definitive evaluation this morning. Discussed with Dr. Stark Jock.  Will transfer to [place] long.     Final Clinical Impressions(s) / ED  Diagnoses   Final  diagnoses:  Kidney stone    New Prescriptions New Prescriptions   No medications on file     Merryl Hacker, MD 03/23/17 [phone]

## 2017-03-23 NOTE — ED Notes (Signed)
Patient transported to X-ray 

## 2017-03-23 NOTE — ED Notes (Signed)
MD with pt  

## 2017-03-23 NOTE — ED Notes (Signed)
Pt rates current pain level 5/10. [facility] on way and will medicate just prior to transport to [facility] ED.

## 2017-03-23 NOTE — ED Notes (Signed)
Returned from CT.

## 2017-03-23 NOTE — Care Management Note (Signed)
Case Management Note  Patient Details  Name: Darrell Perez MRN: [id number] Date of Birth: 19-May-1941  Subjective/Objective:  77 y/o m admitted w/Ureteral stone. From home. Urology-for cysto/ureteral stent.                  Action/Plan:d/c plan home.   Expected Discharge Date:   (UNKNOWN)               Expected Discharge Plan:  Home/Self Care  In-House Referral:     Discharge planning Services  CM Consult  Post Acute Care Choice:    Choice offered to:     DME Arranged:    DME Agency:     HH Arranged:    HH Agency:     Status of Service:  In process, will continue to follow  If discussed at Long Length of Stay Meetings, dates discussed:    Additional Comments:  Dessa Phi, RN 03/23/2017, 10:42 AM

## 2017-03-23 NOTE — Progress Notes (Addendum)
Patient arrived to the unit alert oriented x4 ambulatory with walker. C/o 4/10 pain. Pt now reports pain as worsening  5-6/10. PO pain medication administered, pt reports nausea. Urology Provider contacted pt will have stent placed today. Order received to give Morphine 2 mg every 3 hours as needed for sever pain. Will continue to monitor.

## 2017-03-23 NOTE — Anesthesia Procedure Notes (Signed)
Procedure Name: LMA Insertion Date/Time: 03/23/2017 2:00 PM Performed by: Claudia Desanctis Pre-anesthesia Checklist: Emergency Drugs available, Patient identified, Suction available and Patient being monitored Patient Re-evaluated:Patient Re-evaluated prior to induction Oxygen Delivery Method: Circle system utilized Preoxygenation: Pre-oxygenation with 100% oxygen Induction Type: IV induction Ventilation: Mask ventilation without difficulty LMA: LMA inserted LMA Size: 4.0 Number of attempts: 1 Placement Confirmation: positive ETCO2 and breath sounds checked- equal and bilateral Tube secured with: Tape Dental Injury: Teeth and Oropharynx as per pre-operative assessment

## 2017-03-24 ENCOUNTER — Encounter (HOSPITAL_COMMUNITY): Payer: Self-pay | Admitting: Urology

## 2017-03-24 ENCOUNTER — Emergency Department (HOSPITAL_COMMUNITY): Payer: PPO

## 2017-03-24 ENCOUNTER — Inpatient Hospital Stay (HOSPITAL_COMMUNITY)
Admission: EM | Admit: 2017-03-24 | Discharge: 2017-03-27 | DRG: 854 | Disposition: A | Discharge status: Home / Self Care | Payer: PPO | Attending: Internal Medicine | Admitting: Internal Medicine

## 2017-03-24 DIAGNOSIS — Z87442 Personal history of urinary calculi: Secondary | ICD-10-CM

## 2017-03-24 DIAGNOSIS — E876 Hypokalemia: Secondary | ICD-10-CM | POA: Diagnosis present

## 2017-03-24 DIAGNOSIS — I10 Essential (primary) hypertension: Secondary | ICD-10-CM | POA: Diagnosis present

## 2017-03-24 DIAGNOSIS — K219 Gastro-esophageal reflux disease without esophagitis: Secondary | ICD-10-CM | POA: Diagnosis present

## 2017-03-24 DIAGNOSIS — Z961 Presence of intraocular lens: Secondary | ICD-10-CM | POA: Diagnosis present

## 2017-03-24 DIAGNOSIS — Z9842 Cataract extraction status, left eye: Secondary | ICD-10-CM

## 2017-03-24 DIAGNOSIS — N400 Enlarged prostate without lower urinary tract symptoms: Secondary | ICD-10-CM | POA: Diagnosis present

## 2017-03-24 DIAGNOSIS — N281 Cyst of kidney, acquired: Secondary | ICD-10-CM | POA: Diagnosis present

## 2017-03-24 DIAGNOSIS — Z79899 Other long term (current) drug therapy: Secondary | ICD-10-CM

## 2017-03-24 DIAGNOSIS — N201 Calculus of ureter: Secondary | ICD-10-CM | POA: Diagnosis present

## 2017-03-24 DIAGNOSIS — N179 Acute kidney failure, unspecified: Secondary | ICD-10-CM | POA: Diagnosis present

## 2017-03-24 DIAGNOSIS — K5730 Diverticulosis of large intestine without perforation or abscess without bleeding: Secondary | ICD-10-CM | POA: Diagnosis present

## 2017-03-24 DIAGNOSIS — M1000 Idiopathic gout, unspecified site: Secondary | ICD-10-CM | POA: Diagnosis present

## 2017-03-24 DIAGNOSIS — N136 Pyonephrosis: Secondary | ICD-10-CM | POA: Diagnosis present

## 2017-03-24 DIAGNOSIS — I471 Supraventricular tachycardia: Secondary | ICD-10-CM | POA: Diagnosis present

## 2017-03-24 DIAGNOSIS — Z7982 Long term (current) use of aspirin: Secondary | ICD-10-CM

## 2017-03-24 DIAGNOSIS — M1712 Unilateral primary osteoarthritis, left knee: Secondary | ICD-10-CM | POA: Diagnosis present

## 2017-03-24 DIAGNOSIS — N12 Tubulo-interstitial nephritis, not specified as acute or chronic: Secondary | ICD-10-CM | POA: Diagnosis present

## 2017-03-24 DIAGNOSIS — I4892 Unspecified atrial flutter: Secondary | ICD-10-CM | POA: Diagnosis present

## 2017-03-24 DIAGNOSIS — A419 Sepsis, unspecified organism: Principal | ICD-10-CM | POA: Diagnosis present

## 2017-03-24 DIAGNOSIS — N390 Urinary tract infection, site not specified: Secondary | ICD-10-CM | POA: Diagnosis present

## 2017-03-24 DIAGNOSIS — Z9989 Dependence on other enabling machines and devices: Secondary | ICD-10-CM

## 2017-03-24 DIAGNOSIS — I493 Ventricular premature depolarization: Secondary | ICD-10-CM | POA: Diagnosis present

## 2017-03-24 DIAGNOSIS — B952 Enterococcus as the cause of diseases classified elsewhere: Secondary | ICD-10-CM | POA: Diagnosis present

## 2017-03-24 DIAGNOSIS — Z96653 Presence of artificial knee joint, bilateral: Secondary | ICD-10-CM | POA: Diagnosis present

## 2017-03-24 DIAGNOSIS — G4733 Obstructive sleep apnea (adult) (pediatric): Secondary | ICD-10-CM | POA: Diagnosis present

## 2017-03-24 DIAGNOSIS — J45909 Unspecified asthma, uncomplicated: Secondary | ICD-10-CM | POA: Diagnosis present

## 2017-03-24 DIAGNOSIS — M109 Gout, unspecified: Secondary | ICD-10-CM

## 2017-03-24 DIAGNOSIS — I4891 Unspecified atrial fibrillation: Secondary | ICD-10-CM | POA: Diagnosis present

## 2017-03-24 DIAGNOSIS — Z96611 Presence of right artificial shoulder joint: Secondary | ICD-10-CM | POA: Diagnosis present

## 2017-03-24 DIAGNOSIS — Z886 Allergy status to analgesic agent status: Secondary | ICD-10-CM

## 2017-03-24 DIAGNOSIS — R319 Hematuria, unspecified: Secondary | ICD-10-CM

## 2017-03-24 DIAGNOSIS — Z9841 Cataract extraction status, right eye: Secondary | ICD-10-CM

## 2017-03-24 DIAGNOSIS — IMO0002 Reserved for concepts with insufficient information to code with codable children: Secondary | ICD-10-CM

## 2017-03-24 LAB — URINALYSIS, ROUTINE W REFLEX MICROSCOPIC
Bilirubin Urine: NEGATIVE
GLUCOSE, UA: NEGATIVE mg/dL
Ketones, ur: NEGATIVE mg/dL
NITRITE: NEGATIVE
PH: 5 (ref 5.0–8.0)
Protein, ur: 30 mg/dL — AB
Specific Gravity, Urine: 1.014 (ref 1.005–1.030)
Squamous Epithelial / LPF: NONE SEEN

## 2017-03-24 LAB — CBC
HEMATOCRIT: 43 % (ref 39.0–52.0)
Hemoglobin: 14.7 g/dL (ref 13.0–17.0)
MCH: 30.3 pg (ref 26.0–34.0)
MCHC: 34.2 g/dL (ref 30.0–36.0)
MCV: 88.7 fL (ref 78.0–100.0)
Platelets: 218 10*3/uL (ref 150–400)
RBC: 4.85 MIL/uL (ref 4.22–5.81)
RDW: 14.4 % (ref 11.5–15.5)
WBC: 18.7 10*3/uL — AB (ref 4.0–10.5)

## 2017-03-24 LAB — COMPREHENSIVE METABOLIC PANEL
ALK PHOS: 45 U/L (ref 38–126)
ALT: 28 U/L (ref 17–63)
ALT: 28 U/L (ref 17–63)
ANION GAP: 8 (ref 5–15)
AST: 28 U/L (ref 15–41)
AST: 36 U/L (ref 15–41)
Albumin: 3.2 g/dL — ABNORMAL LOW (ref 3.5–5.0)
Albumin: 3.5 g/dL (ref 3.5–5.0)
Alkaline Phosphatase: 35 U/L — ABNORMAL LOW (ref 38–126)
Anion gap: 10 (ref 5–15)
BUN: 25 mg/dL — ABNORMAL HIGH (ref 6–20)
BUN: 27 mg/dL — AB (ref 6–20)
CALCIUM: 8.8 mg/dL — AB (ref 8.9–10.3)
CHLORIDE: 106 mmol/L (ref 101–111)
CHLORIDE: 104 mmol/L (ref 101–111)
CO2: 27 mmol/L (ref 22–32)
CO2: 25 mmol/L (ref 22–32)
CREATININE: 1.33 mg/dL — AB (ref 0.61–1.24)
Calcium: 8.6 mg/dL — ABNORMAL LOW (ref 8.9–10.3)
Creatinine, Ser: 1.33 mg/dL — ABNORMAL HIGH (ref 0.61–1.24)
GFR, EST AFRICAN AMERICAN: 58 mL/min — AB (ref >60)
GFR, EST AFRICAN AMERICAN: 58 mL/min — AB (ref >60)
GFR, EST NON AFRICAN AMERICAN: 50 mL/min — AB (ref >60)
GFR, EST NON AFRICAN AMERICAN: 50 mL/min — AB (ref >60)
Glucose, Bld: 134 mg/dL — ABNORMAL HIGH (ref 65–99)
Glucose, Bld: 92 mg/dL (ref 65–99)
Potassium: 3.7 mmol/L (ref 3.5–5.1)
Potassium: 3.5 mmol/L (ref 3.5–5.1)
SODIUM: 141 mmol/L (ref 135–145)
Sodium: 139 mmol/L (ref 135–145)
Total Bilirubin: 0.6 mg/dL (ref 0.3–1.2)
Total Bilirubin: 0.7 mg/dL (ref 0.3–1.2)
Total Protein: 6 g/dL — ABNORMAL LOW (ref 6.5–8.1)
Total Protein: 6.9 g/dL (ref 6.5–8.1)

## 2017-03-24 LAB — CBC WITH DIFFERENTIAL/PLATELET
BASOS PCT: 0 %
Basophils Absolute: 0 10*3/uL (ref 0.0–0.1)
EOS PCT: 0 %
Eosinophils Absolute: 0 10*3/uL (ref 0.0–0.7)
HCT: 45.5 % (ref 39.0–52.0)
HEMOGLOBIN: 15.8 g/dL (ref 13.0–17.0)
LYMPHS ABS: 1 10*3/uL (ref 0.7–4.0)
Lymphocytes Relative: 5 %
MCH: 30.8 pg (ref 26.0–34.0)
MCHC: 34.7 g/dL (ref 30.0–36.0)
MCV: 88.7 fL (ref 78.0–100.0)
MONOS PCT: 10 %
Monocytes Absolute: 2.1 10*3/uL — ABNORMAL HIGH (ref 0.1–1.0)
NEUTROS PCT: 85 %
Neutro Abs: 18.2 10*3/uL — ABNORMAL HIGH (ref 1.7–7.7)
Platelets: 228 10*3/uL (ref 150–400)
RBC: 5.13 MIL/uL (ref 4.22–5.81)
RDW: 14.5 % (ref 11.5–15.5)
WBC: 21.4 10*3/uL — AB (ref 4.0–10.5)

## 2017-03-24 LAB — PROTIME-INR
INR: 0.98
Prothrombin Time: 12.9 seconds (ref 11.4–15.2)

## 2017-03-24 LAB — CG4 I-STAT (LACTIC ACID): LACTIC ACID, VENOUS: 1.04 mmol/L (ref 0.5–1.9)

## 2017-03-24 MED ORDER — CIPROFLOXACIN HCL 500 MG PO TABS: 500.0000 mg | ORAL_TABLET | Freq: Two times a day (BID) | ORAL | 0 refills | Status: DC

## 2017-03-24 MED ORDER — ACETAMINOPHEN 325 MG PO TABS
650.0000 mg | ORAL_TABLET | Freq: Once | ORAL | Status: AC | PRN
  Administered 2017-03-24: 650 mg via ORAL
  Filled 2017-03-24: qty 2

## 2017-03-24 NOTE — Discharge Summary (Signed)
Physician Discharge Summary  Darrell Perez GQQ:[id number] DOB: [birth year] DOA: 03/23/2017  PCP: Lavone Orn, MD  Admit date: 03/23/2017 Discharge date: 03/24/2017  Time spent: 35 minutes  Recommendations for Outpatient Follow-up:  1. Dr.Griffin or Dr.Bell please FU pts urine cultures in next 24-48hours and change Abx if needed, final cultrues pending at discharge, pt instructed to call PCP's office tomorrow   Discharge Diagnoses:  Principal Problem:   Ureteral stone Active Problems:   Essential hypertension, benign   Nephrolithiasis   Discharge Condition: stable  Diet recommendation: heart healthy  Filed Weights   03/23/17 0124  Weight: 95.3 kg (210 lb)    History of present illness:  76 year old male with a history of nephrolithiasis, GERD, hypertension, sleep apnea who presents with left flank pain, which started at 8 PM on 9/23. The patient had associated nausea vomiting.Urinalysis does have too numerous to count white cells and red cells. CT stone study obtained. Patient has an obstructing 7 mm stone on the left  Hospital Course:   Left ureteral calculus with UTI, mild pyelonephritis -Seen by urology, underwent stenting last evening -felt to be stable for discharge on oral ABx and FU with Urology for definitive stone management -final urine cultures pending at discharge, pt is very anxious to go home and hence discharged on ciprofloxacin -he will call PCP's office tomorrow and if cultures back then and if ok to continue ciprofloxacin   Essential hypertension, benign -stable, continue pindolol  Gout-continue allopurinol  Hypokalemia-repleted  Procedures: 1. Cystoscopy with left retrograde pyelogram and left ureteral stent placement  Consultations:  Urology  Discharge Exam: Vitals:   03/24/17 0900 03/24/17 1257  BP: (!) 144/57 (!) 148/60  Pulse: (!) 53 (!) 59  Resp: 16 16  Temp: 98 F (36.7 C) 98.1 F (36.7 C)  SpO2:      General:  AAOx3 Cardiovascular: S1S2/RRR Respiratory: CTAB  Discharge Instructions   Discharge Instructions    Diet - low sodium heart healthy    Complete by:  As directed    Diet - low sodium heart healthy    Complete by:  As directed    Increase activity slowly    Complete by:  As directed    Increase activity slowly    Complete by:  As directed      Current Discharge Medication List    START taking these medications   Details  ciprofloxacin (CIPRO) 500 MG tablet Take 1 tablet (500 mg total) by mouth 2 (two) times daily. For 5days Qty: 10 tablet, Refills: 0      CONTINUE these medications which have NOT CHANGED   Details  acetaminophen (TYLENOL) 500 MG tablet Take by mouth every 6 (six) hours as needed for mild pain, moderate pain, fever or headache.     allopurinol (ZYLOPRIM) 100 MG tablet Take 100 mg by mouth daily.     aspirin EC 81 MG tablet Take 81 mg by mouth daily.    cholecalciferol (VITAMIN D) 1000 UNITS tablet Take 1,000 Units by mouth daily.    HYDROcodone-acetaminophen (NORCO/VICODIN) 5-325 MG tablet Take 1 tablet by mouth every 6 (six) hours as needed for moderate pain.    hyoscyamine (ANASPAZ) 0.125 MG TBDP disintergrating tablet Place 0.125 mg under the tongue every 6 (six) hours as needed for cramping.    indapamide (LOZOL) 2.5 MG tablet Take 2.5 mg by mouth every evening.     loperamide (IMODIUM A-D) 2 MG tablet Take 2-4 mg by mouth 4 (four) times daily as needed  for diarrhea or loose stools.    loratadine (CLARITIN) 10 MG tablet Take 10 mg by mouth daily.    pindolol (VISKEN) 5 MG tablet Take 5 mg by mouth every evening.     potassium chloride SA (K-DUR,KLOR-CON) 20 MEQ tablet Take 20 mEq by mouth 2 (two) times daily.    vitamin B-12 (CYANOCOBALAMIN) 1000 MCG tablet Take 1,000 mcg by mouth daily.      STOP taking these medications     amoxicillin (AMOXIL) 500 MG capsule        Allergies  Allergen Reactions  . Naprosyn [Naproxen] Other (See  Comments)    "makes me jittery/nervous"   Follow-up Information    Lavone Orn, MD Follow up.   Specialty:  Internal Medicine Why:  Please call Dr.Griffin or Dr.Bell(Urology ) office tomorrow to Follow up on urine culture reports to see if they need to change your antibiotic Contact information: [address] [place] [address] [phone]            The results of significant diagnostics from this hospitalization (including imaging, microbiology, ancillary and laboratory) are listed below for reference.    Significant Diagnostic Studies: Dg Abdomen 1 View  Addendum Date: 03/23/2017   ADDENDUM REPORT: 03/23/2017 03:44 ADDENDUM: Additionally, there is a 6 mm rounded calcification projecting over left SI joint confirmed to be ureteral stone on subsequent CT. Electronically Signed   By: Ashley Royalty M.D.   On: 03/23/2017 03:44   Result Date: 03/23/2017 CLINICAL DATA:  Bilateral renal stones. Right flank pain starting 6 hours ago. EXAM: ABDOMEN - 1 VIEW COMPARISON:  07/07/2016 abdomen radiographs. FINDINGS: Clusters of calcifications are again noted of both kidneys projecting over the lower poles, the largest cluster on the right in aggregate is estimated at 2.6 x 1.4 cm and unchanged in appearance. The largest calcification noted on the left also projects over the lower pole measuring 1.9 x 1.2 cm. A previously seen 0.5 cm upper pole calcification is no longer apparent on the left. 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 No calcifications are identified along the course of the expected ureters. No bladder calculi are apparent. Bowel gas pattern is unremarkable apart from increased colonic stool burden within cecum and ascending colon to the hepatic flexure. IMPRESSION: 1. Bilateral stable lower pole renal calculi. No definite ureteral stones or bladder calcifications. 2. Increased stool burden within the cecum and ascending colon. 3. Levoscoliosis  with lumbar spondylosis, stable in appearance. Electronically Signed: By: Ashley Royalty M.D. On: 03/23/2017 02:40   Dg C-arm 1-60 Min-no Report  Result Date: 03/23/2017 Fluoroscopy was utilized by the requesting physician.  No radiographic interpretation.   Ct Renal Stone Study  Result Date: 03/23/2017 CLINICAL DATA:  Right flank pain starting 7 hours ago. EXAM: CT ABDOMEN AND PELVIS WITHOUT CONTRAST TECHNIQUE: Multidetector CT imaging of the abdomen and pelvis was performed following the standard protocol without IV contrast. COMPARISON:  Same day KUB FINDINGS: Lower chest: Normal size heart without pericardial effusion. Minimal pleural thickening at the left lung base. No pneumonic consolidation or pneumothorax. Hepatobiliary: No focal liver abnormality is seen given the limitations of a noncontrast study. No gallstones, gallbladder wall thickening, or biliary dilatation. Pancreas: Mild atrophy without focal mass or ductal dilatation. No inflammation. Spleen: Normal Adrenals/Urinary Tract: Normal bilateral adrenal glands. Left-sided perinephric fat stranding with mild hydroureteronephrosis attributable to a calculus measures 7 mm at the pelvic brim. No right-sided hydroureteronephrosis. Bilateral lower pole renal calculi are  noted, the largest on the right measuring 1.7 x 1.1 cm adjacent to a 1.5 x 0.7 cm calculus. On the left, there is a lower pole 1.4 x 1.2 cm calculus all measured on the axial plane. 31.1 cm upper pole right renal cyst is noted. Stomach/Bowel: Normal appendix. The stomach, small intestine and large bowel are non acute. Scattered colonic diverticulosis at the junction of the descending sigmoid colon. No acute inflammation. Vascular/Lymphatic: Aortoiliac and branch vessel atherosclerosis. No aneurysm. No adenopathy. Reproductive: Normal size prostate. Other: No abdominopelvic ascites. Atrophy of the left oblique muscles. Musculoskeletal: Levoscoliosis and lumbar spondylosis. IMPRESSION: 1.  7 mm left ureteral stone with mild left-sided hydroureteronephrosis perinephric fat stranding. 2. Bilateral nonobstructing lower pole renal calculi as above. 3. Simple appearing 1 cm upper pole right renal cyst. 4. Left-sided colonic diverticulosis without acute diverticulitis. Electronically Signed   By: Ashley Royalty M.D.   On: 03/23/2017 03:42    Microbiology: Recent Results (from the past 240 hour(s))  Urine culture     Status: Abnormal (Preliminary result)   Collection Time: 03/23/17  1:31 AM  Result Value Ref Range Status   Specimen Description Urine  Final   Special Requests NONE  Final   Culture (A)  Final    >=100,000 COLONIES/mL UNIDENTIFIED ORGANISM Performed at [facility], [address], [place], [place] [address]    Report Status PENDING  Incomplete     Labs: Basic Metabolic Panel:  Recent Labs Lab 03/23/17 0157 03/23/17 0928 03/24/17 0601  NA 138  --  141  K 3.3*  --  3.7  CL 105  --  106  CO2 25  --  27  GLUCOSE 146*  --  134*  BUN 23*  --  25*  CREATININE 1.25*  --  1.33*  CALCIUM 9.2  --  8.6*  MG  --  1.7  --    Liver Function Tests:  Recent Labs Lab 03/24/17 0601  AST 28  ALT 28  ALKPHOS 35*  BILITOT 0.6  PROT 6.0*  ALBUMIN 3.2*   No results for input(s): LIPASE, AMYLASE in the last 168 hours. No results for input(s): AMMONIA in the last 168 hours. CBC:  Recent Labs Lab 03/23/17 0157 03/24/17 0601  WBC 9.9 18.7*  NEUTROABS 7.9*  --   HGB 15.9 14.7  HCT 45.5 43.0  MCV 87.3 88.7  PLT 260 218   Cardiac Enzymes:  Recent Labs Lab 03/23/17 0928 03/23/17 1602 03/23/17 2018  TROPONINI <0.03 0.03* <0.03   BNP: BNP (last 3 results) No results for input(s): BNP in the last 8760 hours.  ProBNP (last 3 results) No results for input(s): PROBNP in the last 8760 hours.  CBG: No results for input(s): GLUCAP in the last 168 hours.     SignedDomenic Polite MD.  [facility] 03/24/2017, 2:13 PM

## 2017-03-24 NOTE — ED Triage Notes (Signed)
Pt from home with c/o urinary frequency. Pt states he also has 17/10 pain/ Pt states he recently had a stint placed for a kidney stone. Pt states he was told yesterday he has a kidney infection and was instructed to take cipro. Pt states he developed a fever this evening ov 101. Pt is still febrile and tachycardic at time of assessment.

## 2017-03-24 NOTE — Consult Note (Signed)
   [facility] CM Inpatient Consult   03/24/2017  TERRYN REDNER 09/08/1940 [id number]      Mr. Scholle screened for potential [facility] Care Management program on behalf of his [employer] insurance.   He denies any current [facility] Care Management needs. However, he is agreeable to an [facility] post discharge call.  He is aware that his Primary Care office will perform post transition of care calls.  Provided [facility] Care Management brochure and 24-hr nurse line magnet.  Appreciative of visit.  Will refer for [facility] post discharge call.  Will make inpatient RNCM aware.   Marthenia Rolling, MSN-Ed, RN,BSN [facility] Liaison [phone]

## 2017-03-24 NOTE — Progress Notes (Signed)
Urology Inpatient Progress Report  Kidney stone [N20.0]  Procedure(s): CYSTOSCOPY, LEFT RETROGRADE WITH STENT PLACEMENT  1 Day Post-Op   Intv/Subj: No acute events overnight. Patient is without complaint. He has mild leukocytosis at 18.  He is been afebrile.  Urine culture is pending, but currently has 100,000 colonies of unknown species.  Principal Problem:   Ureteral stone Active Problems:   Essential hypertension, benign   Nephrolithiasis  Current Facility-Administered Medications  Medication Dose Route Frequency Provider Last Rate Last Dose  . 0.9 % NaCl with KCl 40 mEq / L  infusion   Intravenous Continuous Reyne Dumas, MD 75 mL/hr at 03/24/17 0308 75 mL/hr at 03/24/17 0308  . acetaminophen (TYLENOL) tablet 500 mg  500 mg Oral Q6H PRN Reyne Dumas, MD      . allopurinol (ZYLOPRIM) tablet 100 mg  100 mg Oral Daily Reyne Dumas, MD   100 mg at 03/24/17 0930  . aspirin EC tablet 81 mg  81 mg Oral Daily Reyne Dumas, MD   81 mg at 03/24/17 0931  . cefTRIAXone (ROCEPHIN) 1 g in dextrose 5 % 50 mL IVPB  1 g Intravenous Q24H Reyne Dumas, MD   Stopped at 03/24/17 0959  . cholecalciferol (VITAMIN D) tablet 1,000 Units  1,000 Units Oral Daily Reyne Dumas, MD   1,000 Units at 03/24/17 0930  . heparin injection 5,000 Units  5,000 Units Subcutaneous Q8H Reyne Dumas, MD   5,000 Units at 03/24/17 0715  . HYDROcodone-acetaminophen (NORCO/VICODIN) 5-325 MG per tablet 1 tablet  1 tablet Oral Q6H PRN Reyne Dumas, MD   1 tablet at 03/23/17 0853  . hyoscyamine (ANASPAZ) disintergrating tablet 0.125 mg  0.125 mg Sublingual Q6H PRN Reyne Dumas, MD      . levalbuterol (XOPENEX) nebulizer solution 0.63 mg  0.63 mg Nebulization Q6H PRN Reyne Dumas, MD      . loratadine (CLARITIN) tablet 10 mg  10 mg Oral Daily Reyne Dumas, MD   10 mg at 03/24/17 0931  . morphine 4 MG/ML injection 2 mg  2 mg Intravenous Q3H PRN Marton Redwood III, MD   2 mg at 03/23/17 1317  . ondansetron (ZOFRAN)  tablet 4 mg  4 mg Oral Q6H PRN Reyne Dumas, MD       Or  . ondansetron (ZOFRAN) injection 4 mg  4 mg Intravenous Q6H PRN Reyne Dumas, MD   4 mg at 03/23/17 1404  . pindolol (VISKEN) tablet 5 mg  5 mg Oral QPM Reyne Dumas, MD   5 mg at 03/23/17 1745  . potassium chloride SA (K-DUR,KLOR-CON) CR tablet 20 mEq  20 mEq Oral BID Reyne Dumas, MD   20 mEq at 03/24/17 0930  . vitamin B-12 (CYANOCOBALAMIN) tablet 1,000 mcg  1,000 mcg Oral Daily Reyne Dumas, MD   1,000 mcg at 03/24/17 0930     Objective: Vital: Vitals:   03/23/17 2112 03/24/17 0013 03/24/17 0530 03/24/17 0900  BP: (!) 130/49 (!) 128/50 (!) 137/56 (!) 144/57  Pulse: (!) 56 (!) 51 (!) 53 (!) 53  Resp: 16 18 20 16   Temp: 98.2 F (36.8 C) 97.7 F (36.5 C) 98.1 F (36.7 C) 98 F (36.7 C)  TempSrc: Oral Oral Oral Oral  SpO2: 95% 100% 97%   Weight:      Height:       I/Os: I/O last 3 completed shifts: In: 3553.8 [P.O.:600; I.V.:1953.8; IV Piggyback:1000] Out: 640 [Urine:640]  Physical Exam:  General: Patient is in no apparent distress Lungs: Normal respiratory  effort, chest expands symmetrically. GI:The abdomen is soft and nontender without mass. GU: no CVA tenderness  Ext: lower extremities symmetric  Lab Results:  Recent Labs  03/23/17 0157 03/24/17 0601  WBC 9.9 18.7*  HGB 15.9 14.7  HCT 45.5 43.0    Recent Labs  03/23/17 0157 03/24/17 0601  NA 138 141  K 3.3* 3.7  CL 105 106  CO2 25 27  GLUCOSE 146* 134*  BUN 23* 25*  CREATININE 1.25* 1.33*  CALCIUM 9.2 8.6*   No results for input(s): LABPT, INR in the last 72 hours. No results for input(s): LABURIN in the last 72 hours. Results for orders placed or performed during the hospital encounter of 03/23/17  Urine culture     Status: Abnormal (Preliminary result)   Collection Time: 03/23/17  1:31 AM  Result Value Ref Range Status   Specimen Description Urine  Final   Special Requests NONE  Final   Culture (A)  Final    >=100,000 COLONIES/mL  UNIDENTIFIED ORGANISM Performed at [facility], [place] [address], [place], [place] [address]    Report Status PENDING  Incomplete    Studies/Results: Dg Abdomen 1 View  Addendum Date: 03/23/2017   ADDENDUM REPORT: 03/23/2017 03:44 ADDENDUM: Additionally, there is a 6 mm rounded calcification projecting over left SI joint confirmed to be ureteral stone on subsequent CT. Electronically Signed   By: Ashley Royalty M.D.   On: 03/23/2017 03:44   Result Date: 03/23/2017 CLINICAL DATA:  Bilateral renal stones. Right flank pain starting 6 hours ago. EXAM: ABDOMEN - 1 VIEW COMPARISON:  07/07/2016 abdomen radiographs. FINDINGS: Clusters of calcifications are again noted of both kidneys projecting over the lower poles, the largest cluster on the right in aggregate is estimated at 2.6 x 1.4 cm and unchanged in appearance. The largest calcification noted on the left also projects over the lower pole measuring 1.9 x 1.2 cm. A previously seen 0.5 cm upper pole calcification is no longer apparent on the left. 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 No calcifications are identified along the course of the expected ureters. No bladder calculi are apparent. Bowel gas pattern is unremarkable apart from increased colonic stool burden within cecum and ascending colon to the hepatic flexure. IMPRESSION: 1. Bilateral stable lower pole renal calculi. No definite ureteral stones or bladder calcifications. 2. Increased stool burden within the cecum and ascending colon. 3. Levoscoliosis with lumbar spondylosis, stable in appearance. Electronically Signed: By: Ashley Royalty M.D. On: 03/23/2017 02:40   Dg C-arm 1-60 Min-no Report  Result Date: 03/23/2017 Fluoroscopy was utilized by the requesting physician.  No radiographic interpretation.   Ct Renal Stone Study  Result Date: 03/23/2017 CLINICAL DATA:  Right flank pain starting 7 hours ago. EXAM: CT ABDOMEN AND PELVIS WITHOUT CONTRAST TECHNIQUE:  Multidetector CT imaging of the abdomen and pelvis was performed following the standard protocol without IV contrast. COMPARISON:  Same day KUB FINDINGS: Lower chest: Normal size heart without pericardial effusion. Minimal pleural thickening at the left lung base. No pneumonic consolidation or pneumothorax. Hepatobiliary: No focal liver abnormality is seen given the limitations of a noncontrast study. No gallstones, gallbladder wall thickening, or biliary dilatation. Pancreas: Mild atrophy without focal mass or ductal dilatation. No inflammation. Spleen: Normal Adrenals/Urinary Tract: Normal bilateral adrenal glands. Left-sided perinephric fat stranding with mild hydroureteronephrosis attributable to a calculus measures 7 mm at the pelvic brim. No right-sided hydroureteronephrosis. Bilateral lower pole renal calculi are noted, the largest on the  right measuring 1.7 x 1.1 cm adjacent to a 1.5 x 0.7 cm calculus. On the left, there is a lower pole 1.4 x 1.2 cm calculus all measured on the axial plane. 31.1 cm upper pole right renal cyst is noted. Stomach/Bowel: Normal appendix. The stomach, small intestine and large bowel are non acute. Scattered colonic diverticulosis at the junction of the descending sigmoid colon. No acute inflammation. Vascular/Lymphatic: Aortoiliac and branch vessel atherosclerosis. No aneurysm. No adenopathy. Reproductive: Normal size prostate. Other: No abdominopelvic ascites. Atrophy of the left oblique muscles. Musculoskeletal: Levoscoliosis and lumbar spondylosis. IMPRESSION: 1. 7 mm left ureteral stone with mild left-sided hydroureteronephrosis perinephric fat stranding. 2. Bilateral nonobstructing lower pole renal calculi as above. 3. Simple appearing 1 cm upper pole right renal cyst. 4. Left-sided colonic diverticulosis without acute diverticulitis. Electronically Signed   By: Ashley Royalty M.D.   On: 03/23/2017 03:42    Assessment:left ureteral calculus with possible urinary tract  infection Procedure(s): CYSTOSCOPY, LEFT RETROGRADE WITH STENT PLACEMENT, 1 Day Post-Op  doing well.patient has mild leukocytosis, which may be reactive in nature given the stent placement yesterday.  He otherwise has no evidence of systemic infection.    Plan: Okay to discharge from urological standpoint.  He needs to be sent with a by mouth antibiotic, such as ciprofloxacin, and the urine culture needs to be followed up on to ensure proper coverage. Can also discharge him with Flomax 0.4 mg daily, oxybutynin 5 mg every 8 hours as needed for bladder discomfort, and pain medication.  He understands to call or return if he experiences fever.   Link Snuffer, MD Urology 03/24/2017, 1:03 PM

## 2017-03-24 NOTE — Care Management CC44 (Signed)
Condition Code 44 Documentation Completed  Patient Details  Name: Darrell Perez MRN: [id number] Date of Birth: [birth year]   Condition Code 44 given:  Yes Patient signature on Condition Code 44 notice:  Yes Documentation of 2 MD's agreement:  Yes Code 44 added to claim:  Yes    Dessa Phi, RN 03/24/2017, 1:08 PM

## 2017-03-24 NOTE — Care Management Obs Status (Signed)
[place] NOTIFICATION   Patient Details  Name: [name] MRN: [id number] Date of Birth: [birth year]   Medicare Observation Status Notification Given:  Yes    MahabirJuliann Pulse, RN 03/24/2017, 1:08 PM

## 2017-03-25 ENCOUNTER — Other Ambulatory Visit: Payer: Self-pay | Admitting: Urology

## 2017-03-25 ENCOUNTER — Encounter (HOSPITAL_COMMUNITY): Payer: Self-pay

## 2017-03-25 ENCOUNTER — Inpatient Hospital Stay (HOSPITAL_COMMUNITY): Payer: PPO

## 2017-03-25 DIAGNOSIS — N400 Enlarged prostate without lower urinary tract symptoms: Secondary | ICD-10-CM | POA: Diagnosis present

## 2017-03-25 DIAGNOSIS — Z7982 Long term (current) use of aspirin: Secondary | ICD-10-CM | POA: Diagnosis not present

## 2017-03-25 DIAGNOSIS — M109 Gout, unspecified: Secondary | ICD-10-CM | POA: Diagnosis present

## 2017-03-25 DIAGNOSIS — Z9841 Cataract extraction status, right eye: Secondary | ICD-10-CM | POA: Diagnosis not present

## 2017-03-25 DIAGNOSIS — N136 Pyonephrosis: Secondary | ICD-10-CM | POA: Diagnosis present

## 2017-03-25 DIAGNOSIS — I10 Essential (primary) hypertension: Secondary | ICD-10-CM

## 2017-03-25 DIAGNOSIS — Z79899 Other long term (current) drug therapy: Secondary | ICD-10-CM | POA: Diagnosis not present

## 2017-03-25 DIAGNOSIS — N12 Tubulo-interstitial nephritis, not specified as acute or chronic: Secondary | ICD-10-CM | POA: Diagnosis present

## 2017-03-25 DIAGNOSIS — Z9842 Cataract extraction status, left eye: Secondary | ICD-10-CM | POA: Diagnosis not present

## 2017-03-25 DIAGNOSIS — Z961 Presence of intraocular lens: Secondary | ICD-10-CM | POA: Diagnosis present

## 2017-03-25 DIAGNOSIS — I480 Paroxysmal atrial fibrillation: Secondary | ICD-10-CM | POA: Diagnosis not present

## 2017-03-25 DIAGNOSIS — N201 Calculus of ureter: Secondary | ICD-10-CM | POA: Diagnosis present

## 2017-03-25 DIAGNOSIS — I471 Supraventricular tachycardia: Secondary | ICD-10-CM

## 2017-03-25 DIAGNOSIS — K219 Gastro-esophageal reflux disease without esophagitis: Secondary | ICD-10-CM | POA: Diagnosis present

## 2017-03-25 DIAGNOSIS — N179 Acute kidney failure, unspecified: Secondary | ICD-10-CM | POA: Diagnosis present

## 2017-03-25 DIAGNOSIS — B952 Enterococcus as the cause of diseases classified elsewhere: Secondary | ICD-10-CM | POA: Diagnosis present

## 2017-03-25 DIAGNOSIS — N390 Urinary tract infection, site not specified: Secondary | ICD-10-CM | POA: Diagnosis present

## 2017-03-25 DIAGNOSIS — I4892 Unspecified atrial flutter: Secondary | ICD-10-CM | POA: Diagnosis present

## 2017-03-25 DIAGNOSIS — Z96611 Presence of right artificial shoulder joint: Secondary | ICD-10-CM | POA: Diagnosis present

## 2017-03-25 DIAGNOSIS — A419 Sepsis, unspecified organism: Secondary | ICD-10-CM | POA: Diagnosis present

## 2017-03-25 DIAGNOSIS — Z886 Allergy status to analgesic agent status: Secondary | ICD-10-CM | POA: Diagnosis not present

## 2017-03-25 DIAGNOSIS — I483 Typical atrial flutter: Secondary | ICD-10-CM | POA: Diagnosis not present

## 2017-03-25 DIAGNOSIS — I4891 Unspecified atrial fibrillation: Secondary | ICD-10-CM

## 2017-03-25 DIAGNOSIS — I361 Nonrheumatic tricuspid (valve) insufficiency: Secondary | ICD-10-CM

## 2017-03-25 DIAGNOSIS — I493 Ventricular premature depolarization: Secondary | ICD-10-CM | POA: Diagnosis present

## 2017-03-25 DIAGNOSIS — N281 Cyst of kidney, acquired: Secondary | ICD-10-CM | POA: Diagnosis present

## 2017-03-25 DIAGNOSIS — E876 Hypokalemia: Secondary | ICD-10-CM | POA: Diagnosis present

## 2017-03-25 DIAGNOSIS — G4733 Obstructive sleep apnea (adult) (pediatric): Secondary | ICD-10-CM | POA: Diagnosis present

## 2017-03-25 HISTORY — PX: TRANSTHORACIC ECHOCARDIOGRAM: SHX275

## 2017-03-25 LAB — BASIC METABOLIC PANEL
Anion gap: 10 (ref 5–15)
BUN: 24 mg/dL — AB (ref 6–20)
CHLORIDE: 104 mmol/L (ref 101–111)
CO2: 24 mmol/L (ref 22–32)
Calcium: 8.1 mg/dL — ABNORMAL LOW (ref 8.9–10.3)
Creatinine, Ser: 1.21 mg/dL (ref 0.61–1.24)
GFR calc Af Amer: >60 mL/min (ref >60)
GFR calc non Af Amer: 56 mL/min — ABNORMAL LOW (ref >60)
Glucose, Bld: 125 mg/dL — ABNORMAL HIGH (ref 65–99)
POTASSIUM: 3.2 mmol/L — AB (ref 3.5–5.1)
SODIUM: 138 mmol/L (ref 135–145)

## 2017-03-25 LAB — TROPONIN I
TROPONIN I: 0.22 ng/mL — AB (ref <0.03)
TROPONIN I: 0.26 ng/mL — AB (ref <0.03)

## 2017-03-25 LAB — URINE CULTURE: Culture: >=100000 — AB

## 2017-03-25 LAB — CBC
HCT: 41.1 % (ref 39.0–52.0)
Hemoglobin: 14 g/dL (ref 13.0–17.0)
MCH: 29.8 pg (ref 26.0–34.0)
MCHC: 34.1 g/dL (ref 30.0–36.0)
MCV: 87.4 fL (ref 78.0–100.0)
PLATELETS: 207 10*3/uL (ref 150–400)
RBC: 4.7 MIL/uL (ref 4.22–5.81)
RDW: 14.7 % (ref 11.5–15.5)
WBC: 15.8 10*3/uL — ABNORMAL HIGH (ref 4.0–10.5)

## 2017-03-25 LAB — TSH: TSH: 0.439 u[IU]/mL (ref 0.350–4.500)

## 2017-03-25 LAB — MRSA PCR SCREENING: MRSA BY PCR: NEGATIVE

## 2017-03-25 LAB — MAGNESIUM: Magnesium: 1.8 mg/dL (ref 1.7–2.4)

## 2017-03-25 LAB — ECHOCARDIOGRAM COMPLETE
HEIGHTINCHES: 69 in
Weight: 3410.96 oz

## 2017-03-25 MED ORDER — SODIUM CHLORIDE 0.9 % IV SOLN
1.0000 g | Freq: Once | INTRAVENOUS | Status: AC
  Administered 2017-03-25: 1 g via INTRAVENOUS
  Filled 2017-03-25: qty 1000

## 2017-03-25 MED ORDER — HYOSCYAMINE SULFATE 0.125 MG PO TBDP
0.1250 mg | ORAL_TABLET | Freq: Four times a day (QID) | ORAL | Status: DC | PRN
  Filled 2017-03-25: qty 1

## 2017-03-25 MED ORDER — LOPERAMIDE HCL 2 MG PO CAPS: 2.0000 mg | ORAL_CAPSULE | Freq: Four times a day (QID) | ORAL | Status: DC | PRN

## 2017-03-25 MED ORDER — ACETAMINOPHEN 325 MG PO TABS
650.0000 mg | ORAL_TABLET | Freq: Four times a day (QID) | ORAL | Status: DC | PRN
  Administered 2017-03-25 (×2): 650 mg via ORAL
  Filled 2017-03-25 (×2): qty 2

## 2017-03-25 MED ORDER — HEPARIN (PORCINE) IN NACL 100-0.45 UNIT/ML-% IJ SOLN: 1300.0000 [IU]/h | INTRAMUSCULAR | Status: DC

## 2017-03-25 MED ORDER — ASPIRIN EC 81 MG PO TBEC
81.0000 mg | DELAYED_RELEASE_TABLET | Freq: Every day | ORAL | Status: DC
  Administered 2017-03-26: 81 mg via ORAL
  Filled 2017-03-25: qty 1

## 2017-03-25 MED ORDER — DOCUSATE SODIUM 100 MG PO CAPS
200.0000 mg | ORAL_CAPSULE | Freq: Once | ORAL | Status: AC
  Administered 2017-03-25: 200 mg via ORAL
  Filled 2017-03-25: qty 2

## 2017-03-25 MED ORDER — ENOXAPARIN SODIUM 100 MG/ML ~~LOC~~ SOLN: 95.0000 mg | Freq: Two times a day (BID) | SUBCUTANEOUS | Status: DC

## 2017-03-25 MED ORDER — DILTIAZEM LOAD VIA INFUSION
20.0000 mg | Freq: Once | INTRAVENOUS | Status: AC
  Administered 2017-03-25: 20 mg via INTRAVENOUS
  Filled 2017-03-25: qty 20

## 2017-03-25 MED ORDER — POTASSIUM CHLORIDE CRYS ER 20 MEQ PO TBCR
20.0000 meq | EXTENDED_RELEASE_TABLET | Freq: Two times a day (BID) | ORAL | Status: DC
  Administered 2017-03-25 – 2017-03-27 (×6): 20 meq via ORAL
  Filled 2017-03-25 (×6): qty 1

## 2017-03-25 MED ORDER — LORATADINE 10 MG PO TABS
10.0000 mg | ORAL_TABLET | Freq: Every day | ORAL | Status: DC
  Administered 2017-03-25 – 2017-03-27 (×3): 10 mg via ORAL
  Filled 2017-03-25 (×3): qty 1

## 2017-03-25 MED ORDER — ASPIRIN EC 325 MG PO TBEC: 325.0000 mg | DELAYED_RELEASE_TABLET | Freq: Every day | ORAL | Status: DC

## 2017-03-25 MED ORDER — DILTIAZEM HCL-DEXTROSE 100-5 MG/100ML-% IV SOLN (PREMIX)
5.0000 mg/h | INTRAVENOUS | Status: DC
  Administered 2017-03-25 (×3): 10 mg/h via INTRAVENOUS
  Filled 2017-03-25 (×2): qty 100

## 2017-03-25 MED ORDER — DILTIAZEM HCL ER COATED BEADS 240 MG PO CP24
240.0000 mg | ORAL_CAPSULE | Freq: Every day | ORAL | Status: DC
  Administered 2017-03-25 – 2017-03-26 (×2): 240 mg via ORAL
  Filled 2017-03-25: qty 1
  Filled 2017-03-25: qty 2

## 2017-03-25 MED ORDER — ASPIRIN EC 81 MG PO TBEC: 81.0000 mg | DELAYED_RELEASE_TABLET | Freq: Every day | ORAL | Status: DC

## 2017-03-25 MED ORDER — DOCUSATE SODIUM 100 MG PO CAPS
100.0000 mg | ORAL_CAPSULE | Freq: Every day | ORAL | Status: DC
  Administered 2017-03-26 – 2017-03-27 (×2): 100 mg via ORAL
  Filled 2017-03-25 (×2): qty 1

## 2017-03-25 MED ORDER — ONDANSETRON HCL 4 MG PO TABS: 4.0000 mg | ORAL_TABLET | Freq: Four times a day (QID) | ORAL | Status: DC | PRN

## 2017-03-25 MED ORDER — ALLOPURINOL 100 MG PO TABS
100.0000 mg | ORAL_TABLET | Freq: Every day | ORAL | Status: DC
  Administered 2017-03-25 – 2017-03-27 (×3): 100 mg via ORAL
  Filled 2017-03-25 (×3): qty 1

## 2017-03-25 MED ORDER — ENOXAPARIN SODIUM 100 MG/ML ~~LOC~~ SOLN
95.0000 mg | SUBCUTANEOUS | Status: AC
  Administered 2017-03-25: 95 mg via SUBCUTANEOUS
  Filled 2017-03-25 (×2): qty 0.95

## 2017-03-25 MED ORDER — INDAPAMIDE 1.25 MG PO TABS: 2.5000 mg | ORAL_TABLET | Freq: Every evening | ORAL | Status: DC

## 2017-03-25 MED ORDER — ORAL CARE MOUTH RINSE: 15.0000 mL | Freq: Two times a day (BID) | OROMUCOSAL | Status: DC

## 2017-03-25 MED ORDER — HYDROCODONE-ACETAMINOPHEN 5-325 MG PO TABS
1.0000 | ORAL_TABLET | Freq: Four times a day (QID) | ORAL | Status: DC | PRN
  Administered 2017-03-26: 1 via ORAL
  Filled 2017-03-25: qty 1

## 2017-03-25 MED ORDER — ENOXAPARIN SODIUM 40 MG/0.4ML ~~LOC~~ SOLN
40.0000 mg | SUBCUTANEOUS | Status: DC
  Administered 2017-03-25: 40 mg via SUBCUTANEOUS
  Filled 2017-03-25: qty 0.4

## 2017-03-25 MED ORDER — SODIUM CHLORIDE 0.9 % IV SOLN
1.0000 g | Freq: Four times a day (QID) | INTRAVENOUS | Status: DC
  Administered 2017-03-25 – 2017-03-26 (×5): 1 g via INTRAVENOUS
  Filled 2017-03-25 (×7): qty 1000

## 2017-03-25 MED ORDER — ONDANSETRON HCL 4 MG/2ML IJ SOLN: 4.0000 mg | Freq: Four times a day (QID) | INTRAMUSCULAR | Status: DC | PRN

## 2017-03-25 MED ORDER — CHLORHEXIDINE GLUCONATE 0.12 % MT SOLN
15.0000 mL | Freq: Two times a day (BID) | OROMUCOSAL | Status: DC
  Administered 2017-03-25 – 2017-03-27 (×3): 15 mL via OROMUCOSAL
  Filled 2017-03-25 (×3): qty 15

## 2017-03-25 MED ORDER — ALBUTEROL SULFATE (2.5 MG/3ML) 0.083% IN NEBU: 2.5000 mg | INHALATION_SOLUTION | RESPIRATORY_TRACT | Status: DC | PRN

## 2017-03-25 MED ORDER — ACETAMINOPHEN 650 MG RE SUPP: 650.0000 mg | Freq: Four times a day (QID) | RECTAL | Status: DC | PRN

## 2017-03-25 NOTE — Progress Notes (Signed)
  Echocardiogram 2D Echocardiogram has been performed.  Darlina Sicilian M 03/25/2017, 3:53 PM

## 2017-03-25 NOTE — Progress Notes (Signed)
ANTICOAGULATION CONSULT NOTE - Initial Consult  Pharmacy Consult for heparin Indication: atrial fibrillation  Allergies  Allergen Reactions  . Naprosyn [Naproxen] Other (See Comments)    "makes me jittery/nervous"    Patient Measurements: Height: 5\' 9"  (175.3 cm) Weight: 213 lb 3 oz (96.7 kg) IBW/kg (Calculated) : 70.7 Heparin Dosing Weight: 91 kg  Vital Signs: Temp: 98 F (36.7 C) (09/26 0734) Temp Source: Oral (09/26 0734) BP: 136/51 (09/26 0700) Pulse Rate: 68 (09/26 0700)  Labs:  Recent Labs  03/23/17 0157 03/23/17 0928 03/23/17 1602 03/23/17 2018 03/24/17 0601 03/24/17 2307  HGB 15.9  --   --   --  14.7 15.8  HCT 45.5  --   --   --  43.0 45.5  PLT 260  --   --   --  218 228  LABPROT  --   --   --   --   --  12.9  INR  --   --   --   --   --  0.98  CREATININE 1.25*  --   --   --  1.33* 1.33*  TROPONINI  --  <0.03 0.03* <0.03  --   --     Estimated Creatinine Clearance: 54.2 mL/min (A) (by C-G formula based on SCr of 1.33 mg/dL (H)).   Medical History: Past Medical History:  Diagnosis Date  . Arthritis   . Asthma    15-20 yrs ago  . Dry skin   . Frequency of urination   . GERD (gastroesophageal reflux disease)    NO ISSUES WHEN USING CPAP--  NO MEDS  . Goiter   . H/O hiatal hernia   . History of kidney stones   . Hypertension   . Left knee DJD   . Left ureteral calculus   . Nephrolithiasis 03/23/2017  . Nocturia   . OSA on CPAP    cpap does not know settings   . Urgency of urination   . URI (upper respiratory infection) 11/29/12    see office visit note in chart placed on Zpack    Assessment: 29 yoM admitted with sepsis secondary to left ureteral calculus with Enterococcus faecalis, pyelonephritis.  Found to be in atrial fibrillation, started on Lovenox.  Pharmacy now consulted to switch to heparin infusion.  Patient received 1 mg/kg dose of Lovenox at 6 am this morning.  Not on any anticoagulants PTA.  Baseline Hgb, platelets WNL.  CrCl~54  ml/min.  Goal of Therapy:  Heparin level 0.3-0.7 units/ml Monitor platelets by anticoagulation protocol: Yes   Plan:  At 6 pm this evening (12 hours after treatment Lovenox dose), begin heparin infusion at 1300 units/hr.  No bolus. Check heparin level 8 hours into heparin therapy. Daily heparin level and CBC while on heparin infusion.  Darrell Perez 03/25/2017,8:01 AM

## 2017-03-25 NOTE — H&P (Signed)
History and Physical    JERAL ZICK WUJ:[id number] DOB: [birth year] DOA: 03/24/2017  Referring MD/NP/PA: Montine Circle PA-C PCP: Lavone Orn, MD  Patient coming from: Home via EMS  Chief Complaint: Fever  HPI: Darrell Perez is a 76 y.o. male with medical history significant of nephrolithiasis, HTN, GERD, sleep apnea; who presented with complaints of fever and worsening pain. They should notes that initially symptoms started a p.m. on 9/23. Reported associated symptoms of nausea and vomiting. He presented to the emergency department and a CT study was obtained showing a 7 mm obstructing stone on the left. Patient underwent stenting by Dr. Link Snuffer of urology on 9/24. Patient was placed on ciprofloxacin and was thought to be medically stable to be discharged home. To follow-up with Dr. Gloriann Loan or Dr. Laurann Montana in the outpatient setting. However, once patient got home he reported worsening pain on the left lower back that radiated into the left side and groin. Pain was noted as severe and seemed to worsen with trying to urinate. Subsequently patient developed chills and was noted to have fever of 101.68F. His wife called the urologist and they advised him to come to emergency department for further evaluation.  ED Course: Upon admission into the emergency department patient was seen to be febrile up to 100.47F, pulse up to 150s thought to be A. fib with RVR, respirations 16-22, blood pressures 117/93 -160/101, and O2 saturations maintained on room air. Chest x-ray shows signs of vascular congestion with small pleural effusion. Patient was started on diltiazem drip.  Review of Systems  Constitutional: Positive for chills, fever and malaise/fatigue.  HENT: Negative for ear discharge and ear pain.   Eyes: Negative for photophobia and pain.  Respiratory: Negative for cough, shortness of breath and wheezing.   Cardiovascular: Negative for chest pain and leg swelling.  Gastrointestinal: Positive for  nausea.  Genitourinary: Positive for dysuria and flank pain.  Musculoskeletal: Negative for falls.  Skin: Negative for itching and rash.  Neurological: Negative for sensory change and speech change.  Endo/Heme/Allergies: Negative for polydipsia. Does not bruise/bleed easily.  Psychiatric/Behavioral: Negative for hallucinations and substance abuse.    Past Medical History:  Diagnosis Date  . Arthritis   . Asthma    15-20 yrs ago  . Dry skin   . Frequency of urination   . GERD (gastroesophageal reflux disease)    NO ISSUES WHEN USING CPAP--  NO MEDS  . Goiter   . H/O hiatal hernia   . History of kidney stones   . Hypertension   . Left knee DJD   . Left ureteral calculus   . Nephrolithiasis 03/23/2017  . Nocturia   . OSA on CPAP    cpap does not know settings   . Urgency of urination   . URI (upper respiratory infection) 11/29/12    see office visit note in chart placed on Zpack    Past Surgical History:  Procedure Laterality Date  . CARPAL TUNNEL RELEASE Bilateral [place]   . CATARACT EXTRACTION W/ INTRAOCULAR LENS  IMPLANT, BILATERAL  2005  &  2012  . CYSTOSCOPY WITH STENT PLACEMENT Left 03/23/2017   Procedure: CYSTOSCOPY, LEFT RETROGRADE WITH STENT PLACEMENT;  Surgeon: Lucas Mallow, MD;  Location: [facility];  Service: Urology;  Laterality: Left;  . EXTRACORPOREAL SHOCK WAVE LITHOTRIPSY     MULTIPLE  . HEMIARTHROPLASTY RIGHT SHOULDER  2007  . HOLMIUM LASER APPLICATION Left 9/56/2130   Procedure: HOLMIUM LASER APPLICATION;  Surgeon:  Fredricka Bonine, MD;  Location: [facility];  Service: Urology;  Laterality: Left;  . [place]   removal of stones. (OPEN PROCUDURE)  . REVERSE SHOULDER ARTHROPLASTY  10/09/2011   Procedure: REVERSE SHOULDER ARTHROPLASTY;  Surgeon: Marin Shutter, MD;  Location: [place];  Service: Orthopedics;  Laterality: Right;  right hardware removal and reverse shoulder arthroplasty  . THYROID LOBECTOMY   1967   goiter removed  . TONSILLECTOMY    . TOTAL KNEE ARTHROPLASTY  07/22/2012   Procedure: TOTAL KNEE ARTHROPLASTY;  Surgeon: Johnn Hai, MD;  Location: [facility];  Service: Orthopedics;  Laterality: Right;  . TOTAL KNEE ARTHROPLASTY Left 12/16/2012   Procedure: LEFT TOTAL KNEE ARTHROPLASTY;  Surgeon: Johnn Hai, MD;  Location: [facility];  Service: Orthopedics;  Laterality: Left;  . TRIGGER FINGER RELEASE Right 2006  . [place] EXTRACTION  2002     reports that he has never smoked. He has never used smokeless tobacco. He reports that he does not drink alcohol or use drugs.  Allergies  Allergen Reactions  . Naprosyn [Naproxen] Other (See Comments)    "makes me jittery/nervous"    Family History  Problem Relation Age of Onset  . Dementia Mother   . Dementia Father   . Anesthesia problems Neg Hx   . Hypotension Neg Hx   . Malignant hyperthermia Neg Hx   . Pseudochol deficiency Neg Hx     Prior to Admission medications   Medication Sig Start Date End Date Taking? Authorizing Provider  acetaminophen (TYLENOL) 500 MG tablet Take by mouth every 6 (six) hours as needed for mild pain, moderate pain, fever or headache.    Yes [provider]  allopurinol (ZYLOPRIM) 100 MG tablet Take 100 mg by mouth daily.    Yes [provider]  aspirin EC 81 MG tablet Take 81 mg by mouth daily.   Yes [provider]  cholecalciferol (VITAMIN D) 1000 UNITS tablet Take 1,000 Units by mouth daily.   Yes [provider]  ciprofloxacin (CIPRO) 500 MG tablet Take 1 tablet (500 mg total) by mouth 2 (two) times daily. For 5days 03/24/17  Yes Domenic Polite, MD  HYDROcodone-acetaminophen (NORCO/VICODIN) 5-325 MG tablet Take 1 tablet by mouth every 6 (six) hours as needed for moderate pain.   Yes [provider]  hyoscyamine (ANASPAZ) 0.125 MG TBDP disintergrating tablet Place 0.125 mg under the tongue every 6 (six) hours as needed for cramping.   Yes  [provider]  indapamide (LOZOL) 2.5 MG tablet Take 2.5 mg by mouth every evening.    Yes [provider]  loperamide (IMODIUM A-D) 2 MG tablet Take 2-4 mg by mouth 4 (four) times daily as needed for diarrhea or loose stools.   Yes [provider]  loratadine (CLARITIN) 10 MG tablet Take 10 mg by mouth daily.   Yes [provider]  pindolol (VISKEN) 5 MG tablet Take 5 mg by mouth every evening.    Yes [provider]  potassium chloride SA (K-DUR,KLOR-CON) 20 MEQ tablet Take 20 mEq by mouth 2 (two) times daily.   Yes [provider]  vitamin B-12 (CYANOCOBALAMIN) 1000 MCG tablet Take 1,000 mcg by mouth daily.   Yes [provider]    Physical Exam:  Constitutional: Elderly male who appears to be in no acute distress Vitals:   03/24/17 2247 03/25/17 0021  BP: (!) 160/101 135/79  Pulse: 99 (!) 135  Resp: 20 (!) 22  Temp: (!) 100.9 F (38.3 C) 98.3 F (36.8 C)  TempSrc: Oral Oral  SpO2:  97%   Eyes: PERRL, lids and conjunctivae normal ENMT: Mucous membranes are moist. Posterior pharynx clear of any exudate or lesions. Neck: normal, supple, no masses, no thyromegaly Respiratory: Mild crackles appreciated in the mid to lower lung fields. Patient able to talk in complete sentences with no accessory muscle usage. Cardiovascular: Tachycardic and irregular, no murmurs / rubs / gallops. No extremity edema. 2+ pedal pulses. No carotid bruits.  Abdomen: no tenderness, no masses palpated. No hepatosplenomegaly. Bowel sounds positive.  Musculoskeletal: no clubbing / cyanosis. No joint deformity upper and lower extremities. Good ROM, no contractures. Normal muscle tone.  Skin: no rashes, lesions, ulcers. No induration Neurologic: CN 2-12 grossly intact. Sensation intact, DTR normal. Strength 5/5 in all 4.  Psychiatric: Normal judgment and insight. Alert and oriented x 3. Normal mood.     Labs on Admission: I have personally  reviewed following labs and imaging studies  CBC:  Recent Labs Lab 03/23/17 0157 03/24/17 0601 03/24/17 2307  WBC 9.9 18.7* 21.4*  NEUTROABS 7.9*  --  18.2*  HGB 15.9 14.7 15.8  HCT 45.5 43.0 45.5  MCV 87.3 88.7 88.7  PLT 260 218 678   Basic Metabolic Panel:  Recent Labs Lab 03/23/17 0157 03/23/17 0928 03/24/17 0601 03/24/17 2307  NA 138  --  141 139  K 3.3*  --  3.7 3.5  CL 105  --  106 104  CO2 25  --  27 25  GLUCOSE 146*  --  134* 92  BUN 23*  --  25* 27*  CREATININE 1.25*  --  1.33* 1.33*  CALCIUM 9.2  --  8.6* 8.8*  MG  --  1.7  --   --    GFR: Estimated Creatinine Clearance: 53.8 mL/min (A) (by C-G formula based on SCr of 1.33 mg/dL (H)). Liver Function Tests:  Recent Labs Lab 03/24/17 0601 03/24/17 2307  AST 28 36  ALT 28 28  ALKPHOS 35* 45  BILITOT 0.6 0.7  PROT 6.0* 6.9  ALBUMIN 3.2* 3.5   No results for input(s): LIPASE, AMYLASE in the last 168 hours. No results for input(s): AMMONIA in the last 168 hours. Coagulation Profile:  Recent Labs Lab 03/24/17 2307  INR 0.98   Cardiac Enzymes:  Recent Labs Lab 03/23/17 0928 03/23/17 1602 03/23/17 2018  TROPONINI <0.03 0.03* <0.03   BNP (last 3 results) No results for input(s): PROBNP in the last 8760 hours. HbA1C: No results for input(s): HGBA1C in the last 72 hours. CBG: No results for input(s): GLUCAP in the last 168 hours. Lipid Profile: No results for input(s): CHOL, HDL, LDLCALC, TRIG, CHOLHDL, LDLDIRECT in the last 72 hours. Thyroid Function Tests: No results for input(s): TSH, T4TOTAL, FREET4, T3FREE, THYROIDAB in the last 72 hours. Anemia Panel: No results for input(s): VITAMINB12, FOLATE, FERRITIN, TIBC, IRON, RETICCTPCT in the last 72 hours. Urine analysis:    Component Value Date/Time   COLORURINE YELLOW 03/24/2017 2327   APPEARANCEUR HAZY (A) 03/24/2017 2327   LABSPEC 1.014 03/24/2017 2327   PHURINE 5.0 03/24/2017 2327   GLUCOSEU NEGATIVE 03/24/2017 2327   HGBUR  LARGE (A) 03/24/2017 2327   BILIRUBINUR NEGATIVE 03/24/2017 2327   KETONESUR NEGATIVE 03/24/2017 2327   PROTEINUR 30 (A) 03/24/2017 2327   UROBILINOGEN 0.2 04/02/2013 1524   NITRITE NEGATIVE 03/24/2017 2327   LEUKOCYTESUR MODERATE (A) 03/24/2017 2327   Sepsis Labs: Recent  Results (from the past 240 hour(s))  Urine culture     Status: Abnormal (Preliminary result)   Collection Time: 03/23/17  1:31 AM  Result Value Ref Range Status   Specimen Description Urine  Final   Special Requests NONE  Final   Culture >=100,000 COLONIES/mL ENTEROCOCCUS FAECALIS (A)  Final   Report Status PENDING  Incomplete     Radiological Exams on Admission: Dg Chest 2 View  Result Date: 03/24/2017 CLINICAL DATA:  Suspected sepsis. Pain fever and weakness. Recent ureteral stent placement. EXAM: CHEST  2 VIEW COMPARISON:  Chest radiographs 12/08/2012 FINDINGS: The cardiomediastinal contours are normal. Atherosclerosis of the aortic arch. Mild vascular congestion. Suspect small pleural effusions. No confluent airspace disease. No pneumothorax. Reverse right shoulder arthroplasty, partially included. Degenerative change in the left shoulder and throughout the spine. No acute osseous abnormalities are seen. IMPRESSION: Vascular congestion.  Suspect small pleural effusions. Electronically Signed   By: Jeb Levering M.D.   On: 03/24/2017 23:37   Dg Abdomen 1 View  Addendum Date: 03/23/2017   ADDENDUM REPORT: 03/23/2017 03:44 ADDENDUM: Additionally, there is a 6 mm rounded calcification projecting over left SI joint confirmed to be ureteral stone on subsequent CT. Electronically Signed   By: Ashley Royalty M.D.   On: 03/23/2017 03:44   Result Date: 03/23/2017 CLINICAL DATA:  Bilateral renal stones. Right flank pain starting 6 hours ago. EXAM: ABDOMEN - 1 VIEW COMPARISON:  07/07/2016 abdomen radiographs. FINDINGS: Clusters of calcifications are again noted of both kidneys projecting over the lower poles, the largest cluster  on the right in aggregate is estimated at 2.6 x 1.4 cm and unchanged in appearance. The largest calcification noted on the left also projects over the lower pole measuring 1.9 x 1.2 cm. A previously seen 0.5 cm upper pole calcification is no longer apparent on the left. 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 No calcifications are identified along the course of the expected ureters. No bladder calculi are apparent. Bowel gas pattern is unremarkable apart from increased colonic stool burden within cecum and ascending colon to the hepatic flexure. IMPRESSION: 1. Bilateral stable lower pole renal calculi. No definite ureteral stones or bladder calcifications. 2. Increased stool burden within the cecum and ascending colon. 3. Levoscoliosis with lumbar spondylosis, stable in appearance. Electronically Signed: By: Ashley Royalty M.D. On: 03/23/2017 02:40   Dg C-arm 1-60 Min-no Report  Result Date: 03/23/2017 Fluoroscopy was utilized by the requesting physician.  No radiographic interpretation.   Ct Renal Stone Study  Result Date: 03/23/2017 CLINICAL DATA:  Right flank pain starting 7 hours ago. EXAM: CT ABDOMEN AND PELVIS WITHOUT CONTRAST TECHNIQUE: Multidetector CT imaging of the abdomen and pelvis was performed following the standard protocol without IV contrast. COMPARISON:  Same day KUB FINDINGS: Lower chest: Normal size heart without pericardial effusion. Minimal pleural thickening at the left lung base. No pneumonic consolidation or pneumothorax. Hepatobiliary: No focal liver abnormality is seen given the limitations of a noncontrast study. No gallstones, gallbladder wall thickening, or biliary dilatation. Pancreas: Mild atrophy without focal mass or ductal dilatation. No inflammation. Spleen: Normal Adrenals/Urinary Tract: Normal bilateral adrenal glands. Left-sided perinephric fat stranding with mild hydroureteronephrosis attributable to a calculus measures 7 mm at the pelvic brim.  No right-sided hydroureteronephrosis. Bilateral lower pole renal calculi are noted, the largest on the right measuring 1.7 x 1.1 cm adjacent to a 1.5 x 0.7 cm calculus. On the left, there is a lower pole 1.4 x 1.2 cm calculus  all measured on the axial plane. 31.1 cm upper pole right renal cyst is noted. Stomach/Bowel: Normal appendix. The stomach, small intestine and large bowel are non acute. Scattered colonic diverticulosis at the junction of the descending sigmoid colon. No acute inflammation. Vascular/Lymphatic: Aortoiliac and branch vessel atherosclerosis. No aneurysm. No adenopathy. Reproductive: Normal size prostate. Other: No abdominopelvic ascites. Atrophy of the left oblique muscles. Musculoskeletal: Levoscoliosis and lumbar spondylosis. IMPRESSION: 1. 7 mm left ureteral stone with mild left-sided hydroureteronephrosis perinephric fat stranding. 2. Bilateral nonobstructing lower pole renal calculi as above. 3. Simple appearing 1 cm upper pole right renal cyst. 4. Left-sided colonic diverticulosis without acute diverticulitis. Electronically Signed   By: Ashley Royalty M.D.   On: 03/23/2017 03:42    EKG: Independently reviewed. A. fib with RVR at a rate of 145 bpm  Assessment/Plan Sepsis secondary to left ureteral calculus with enterococcus faecalis, pyelonephritis: Acute. Patient recently discharged home on antibiotics of ciprofloxacin, but cultures came back positive for Enterococcus faecalis. Sepsis protocol was initiated as patient presented with fever up to 100.9 and tachycardia with elevated WBC to 21.4. Lactic and that reassuring at 1.04. -  Admit to stepdown - Follow-up blood and urine sensitivities - Continue empiric antibiotics of ampicillin per pharmacy - Hydrocodone prn pain - check abd x-ray for proper stent placement - Urology consulted, and will follow-up with patient in a.m.  Atrial fibrillation: Acute. He reports no previous history of having irregular heartbeat. CHADsVASc score  equals at least 3. - Atrial fibrillation focus overside initiated - Diltiazem gtt per protocol - Check TSH - Trend troponins - Lovenox per pharmacy - Check echocardiogram - Message sent for consult to cardiology in a.m.  Essential hypertension  - Hold pindolol  Renal insufficiency: Patient presents with BUN 27 and creatinine 1.33. - Recheck BMP  Gout  - Continue allopurinol  DVT prophylaxis:  Lovenox per pharmacy  Code Status: Full Family Communication:  Discussed plan of care with patient family present at bedside Disposition Plan:  likely discharge home once medically stable Consults called: Urology  Admission status: Inpatient  Norval Morton MD [facility] Pager [phone]   If 7PM-7AM, please contact night-coverage [email or web address] Password [facility]  03/25/2017, 12:55 AM

## 2017-03-25 NOTE — Progress Notes (Signed)
Pt here from ED, connected to monitor and bathed with CHG wipes, pt in sinus rhythm with BP WNL, No acute distress noted and pt denies any pain

## 2017-03-25 NOTE — Consult Note (Addendum)
Cardiology Consultation:   Patient ID: Darrell Perez; [id number]; 1940/12/07   Admit date: 03/24/2017 Date of Consult: 03/25/2017  Primary Care Provider: Lavone Orn, MD Primary Cardiologist: new - Dr. Percival Spanish Primary Electrophysiologist:     Patient Profile:   Darrell Perez is a 76 y.o. male with a hx of HTN, OSA on CPAP, hx of kidney stones, GERD, and asthma who is being seen today for the evaluation of new onset Afib RVR at the request of Dr. Posey Pronto.  History of Present Illness:   Mr. Causer was recently hospitalized 03/23/17 - 03/24/17 with ureteral stone with UTI and nephrolithiases. He underwent stenting and was discharged on cipro with pending urine cultures. He re-presented to [facility] with fever, flank pain, and urinary urgency/frequency/dysuria. He states he has had worsening of his symptoms since being discharged. Urine cultures with ENTEROCOCCUS FAECALIS sensitive to ampicillin, gent, and vanc. Pt was admitted to medicine service and placed on ampicillin. He was also noted to have a heart rate in the 150s, thought to be consistent with Afib.   On my interview, he denies chest pain, dizziness, lightheadedness, palpitations, and shortness of breath. He was unaware of his HR last evening in the ER. Rhythm strips in EPIC with HR 150s. It is difficult to determine if this is Afib RVR vs SVT vs Aflutter. Diltiazem drip started in ER, now NSR in the 70s. Lovenox transitioned to heparin per primary team with AKI.    Past Medical History:  Diagnosis Date  . Arthritis   . Asthma    15-20 yrs ago  . Dry skin   . Frequency of urination   . GERD (gastroesophageal reflux disease)    NO ISSUES WHEN USING CPAP--  NO MEDS  . Goiter   . H/O hiatal hernia   . History of kidney stones   . Hypertension   . Left knee DJD   . Left ureteral calculus   . Nephrolithiasis 03/23/2017  . Nocturia   . OSA on CPAP    cpap does not know settings   . Urgency of urination   . URI (upper respiratory  infection) 11/29/12    see office visit note in chart placed on Zpack    Past Surgical History:  Procedure Laterality Date  . CARPAL TUNNEL RELEASE Bilateral [place]   . CATARACT EXTRACTION W/ INTRAOCULAR LENS  IMPLANT, BILATERAL  2005  &  2012  . CYSTOSCOPY WITH STENT PLACEMENT Left 03/23/2017   Procedure: CYSTOSCOPY, LEFT RETROGRADE WITH STENT PLACEMENT;  Surgeon: Lucas Mallow, MD;  Location: [facility];  Service: Urology;  Laterality: Left;  . EXTRACORPOREAL SHOCK WAVE LITHOTRIPSY     MULTIPLE  . HEMIARTHROPLASTY RIGHT SHOULDER  2007  . HOLMIUM LASER APPLICATION Left 1/69/6789   Procedure: HOLMIUM LASER APPLICATION;  Surgeon: Fredricka Bonine, MD;  Location[facility];  Service: Urology;  Laterality: Left;  . [place]   removal of stones. (OPEN PROCUDURE)  . REVERSE SHOULDER ARTHROPLASTY  10/09/2011   Procedure: REVERSE SHOULDER ARTHROPLASTY;  Surgeon: Marin Shutter, MD;  Location: [place];  Service: Orthopedics;  Laterality: Right;  right hardware removal and reverse shoulder arthroplasty  . THYROID LOBECTOMY  1967   goiter removed  . TONSILLECTOMY    . TOTAL KNEE ARTHROPLASTY  07/22/2012   Procedure: TOTAL KNEE ARTHROPLASTY;  Surgeon: Johnn Hai, MD;  Location: [facility];  Service: Orthopedics;  Laterality: Right;  . TOTAL  KNEE ARTHROPLASTY Left 12/16/2012   Procedure: LEFT TOTAL KNEE ARTHROPLASTY;  Surgeon: Johnn Hai, MD;  Location: [facility];  Service: Orthopedics;  Laterality: Left;  . TRIGGER FINGER RELEASE Right 2006  . [place] EXTRACTION  2002     Home Medications:  Prior to Admission medications   Medication Sig Start Date End Date Taking? Authorizing Provider  acetaminophen (TYLENOL) 500 MG tablet Take by mouth every 6 (six) hours as needed for mild pain, moderate pain, fever or headache.    Yes [provider]  allopurinol (ZYLOPRIM) 100 MG tablet Take 100 mg by mouth daily.    Yes [provider]  aspirin EC 81 MG tablet Take 81 mg by mouth daily.   Yes [provider]  cholecalciferol (VITAMIN D) 1000 UNITS tablet Take 1,000 Units by mouth daily.   Yes [provider]  ciprofloxacin (CIPRO) 500 MG tablet Take 1 tablet (500 mg total) by mouth 2 (two) times daily. For 5days 03/24/17  Yes Domenic Polite, MD  HYDROcodone-acetaminophen (NORCO/VICODIN) 5-325 MG tablet Take 1 tablet by mouth every 6 (six) hours as needed for moderate pain.   Yes [provider]  hyoscyamine (ANASPAZ) 0.125 MG TBDP disintergrating tablet Place 0.125 mg under the tongue every 6 (six) hours as needed for cramping.   Yes [provider]  indapamide (LOZOL) 2.5 MG tablet Take 2.5 mg by mouth every evening.    Yes [provider]  loperamide (IMODIUM A-D) 2 MG tablet Take 2-4 mg by mouth 4 (four) times daily as needed for diarrhea or loose stools.   Yes [provider]  loratadine (CLARITIN) 10 MG tablet Take 10 mg by mouth daily.   Yes [provider]  pindolol (VISKEN) 5 MG tablet Take 5 mg by mouth every evening.    Yes [provider]  potassium chloride SA (K-DUR,KLOR-CON) 20 MEQ tablet Take 20 mEq by mouth 2 (two) times daily.   Yes [provider]  vitamin B-12 (CYANOCOBALAMIN) 1000 MCG tablet Take 1,000 mcg by mouth daily.   Yes [provider]    Inpatient Medications: Scheduled Meds: . allopurinol  100 mg Oral Daily  . loratadine  10 mg Oral Daily  . potassium chloride SA  20 mEq Oral BID   Continuous Infusions: . ampicillin (OMNIPEN) IV 1 g (03/25/17 0818)  . diltiazem (CARDIZEM) infusion 10 mg/hr (03/25/17 0819)  . heparin     PRN Meds: acetaminophen **OR** acetaminophen, albuterol, HYDROcodone-acetaminophen, hyoscyamine, ondansetron **OR** ondansetron (ZOFRAN) IV  Allergies:    Allergies  Allergen Reactions  . Naprosyn [Naproxen] Other (See Comments)    "makes me jittery/nervous"     Social History:   Social History   Social History  . Marital status: Married    Spouse name: N/A  . Number of children: N/A  . Years of education: N/A   Occupational History  . Not on file.   Social History Main Topics  . Smoking status: Never Smoker  . Smokeless tobacco: Never Used  . Alcohol use No  . Drug use: No  . Sexual activity: Not on file   Other Topics Concern  . Not on file   Social History Narrative  . No narrative on file    Family History:    Family History  Problem Relation Age of Onset  . Dementia Mother   . Dementia Father   . Anesthesia problems Neg Hx   . Hypotension Neg Hx   . Malignant hyperthermia Neg  Hx   . Pseudochol deficiency Neg Hx      ROS:  Please see the history of present illness.  ROS  All other ROS reviewed and negative.     Physical Exam/Data:   Vitals:   03/25/17 0600 03/25/17 0630 03/25/17 0700 03/25/17 0734  BP: (!) 154/59 (!) 137/54 (!) 136/51   Pulse: 69 68 68   Resp: (!) 23 (!) 22 (!) 23   Temp:    98 F (36.7 C)  TempSrc:    Oral  SpO2: 94% 94% 94%   Weight:      Height:        Intake/Output Summary (Last 24 hours) at 03/25/17 0836 Last data filed at 03/25/17 0600  Gross per 24 hour  Intake            88.84 ml  Output              250 ml  Net          -161.16 ml   Filed Weights   03/25/17 0422  Weight: 213 lb 3 oz (96.7 kg)   Body mass index is 31.48 kg/m.  General:  Well nourished, well developed, in no acute distress HEENT: normal Neck: no JVD Vascular: No carotid bruits  Cardiac:  normal S1, S2; RRR; no murmur Lungs:  clear to auscultation bilaterally, no wheezing, rhonchi or rales, diminished in bases Abd: soft, nontender, no hepatomegaly  Ext: no edema Musculoskeletal:  No deformities, BUE and BLE strength normal and equal Skin: warm and dry  Neuro:  CNs 2-12 intact, no focal abnormalities noted Psych:  Normal affect   EKG:  The EKG was personally reviewed and demonstrates:  Sinus  rhythm Telemetry:  Telemetry was personally reviewed and demonstrates:  Sinus rhythm with PACs  Relevant CV Studies:  Echocardiogram pending  Laboratory Data:  Chemistry Recent Labs Lab 03/23/17 0157 03/24/17 0601 03/24/17 2307  NA 138 141 139  K 3.3* 3.7 3.5  CL 105 106 104  CO2 25 27 25   GLUCOSE 146* 134* 92  BUN 23* 25* 27*  CREATININE 1.25* 1.33* 1.33*  CALCIUM 9.2 8.6* 8.8*  GFRNONAA 54* 50* 50*  GFRAA >60 58* 58*  ANIONGAP 8 8 10      Recent Labs Lab 03/24/17 0601 03/24/17 2307  PROT 6.0* 6.9  ALBUMIN 3.2* 3.5  AST 28 36  ALT 28 28  ALKPHOS 35* 45  BILITOT 0.6 0.7   Hematology Recent Labs Lab 03/23/17 0157 03/24/17 0601 03/24/17 2307  WBC 9.9 18.7* 21.4*  RBC 5.21 4.85 5.13  HGB 15.9 14.7 15.8  HCT 45.5 43.0 45.5  MCV 87.3 88.7 88.7  MCH 30.5 30.3 30.8  MCHC 34.9 34.2 34.7  RDW 14.4 14.4 14.5  PLT 260 218 228   Cardiac Enzymes Recent Labs Lab 03/23/17 0928 03/23/17 1602 03/23/17 2018  TROPONINI <0.03 0.03* <0.03   No results for input(s): TROPIPOC in the last 168 hours.  BNPNo results for input(s): BNP, PROBNP in the last 168 hours.  DDimer No results for input(s): DDIMER in the last 168 hours.  Radiology/Studies:  Dg Chest 2 View  Result Date: 03/24/2017 CLINICAL DATA:  Suspected sepsis. Pain fever and weakness. Recent ureteral stent placement. EXAM: CHEST  2 VIEW COMPARISON:  Chest radiographs 12/08/2012 FINDINGS: The cardiomediastinal contours are normal. Atherosclerosis of the aortic arch. Mild vascular congestion. Suspect small pleural effusions. No confluent airspace disease. No pneumothorax. Reverse right shoulder arthroplasty, partially included. Degenerative change in the left shoulder and  throughout the spine. No acute osseous abnormalities are seen. IMPRESSION: Vascular congestion.  Suspect small pleural effusions. Electronically Signed   By: Jeb Levering M.D.   On: 03/24/2017 23:37   Dg Abdomen 1 View  Addendum Date:  03/23/2017   ADDENDUM REPORT: 03/23/2017 03:44 ADDENDUM: Additionally, there is a 6 mm rounded calcification projecting over left SI joint confirmed to be ureteral stone on subsequent CT. Electronically Signed   By: Ashley Royalty M.D.   On: 03/23/2017 03:44   Result Date: 03/23/2017 CLINICAL DATA:  Bilateral renal stones. Right flank pain starting 6 hours ago. EXAM: ABDOMEN - 1 VIEW COMPARISON:  07/07/2016 abdomen radiographs. FINDINGS: Clusters of calcifications are again noted of both kidneys projecting over the lower poles, the largest cluster on the right in aggregate is estimated at 2.6 x 1.4 cm and unchanged in appearance. The largest calcification noted on the left also projects over the lower pole measuring 1.9 x 1.2 cm. A previously seen 0.5 cm upper pole calcification is no longer apparent on the left. 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 No calcifications are identified along the course of the expected ureters. No bladder calculi are apparent. Bowel gas pattern is unremarkable apart from increased colonic stool burden within cecum and ascending colon to the hepatic flexure. IMPRESSION: 1. Bilateral stable lower pole renal calculi. No definite ureteral stones or bladder calcifications. 2. Increased stool burden within the cecum and ascending colon. 3. Levoscoliosis with lumbar spondylosis, stable in appearance. Electronically Signed: By: Ashley Royalty M.D. On: 03/23/2017 02:40   Dg Abd Portable 1v  Result Date: 03/25/2017 CLINICAL DATA:  Renal stent.  Fever. EXAM: PORTABLE ABDOMEN - 1 VIEW COMPARISON:  CT 03/23/2017 FINDINGS: Left ureteral stent in place, proximal coil in the region the renal pelvis common distal coli L in the region of the bladder. Probable 7 mm stone adjacent to the stent at the level of sacrum. 17 mm stone in the lower left kidney again seen. Multiple right renal stones also seen. No stones in the region of the right ureter. There is a normal bowel gas  pattern. No free air. Degenerative change in the spine. IMPRESSION: 1. Left ureteral stent in place. Probable stone adjacent to the distal stent at the level of the mid sacrum. 2. Bilateral nonobstructing nephrolithiasis. Electronically Signed   By: Jeb Levering M.D.   On: 03/25/2017 03:13   Dg C-arm 1-60 Min-no Report  Result Date: 03/23/2017 Fluoroscopy was utilized by the requesting physician.  No radiographic interpretation.   Ct Renal Stone Study  Result Date: 03/23/2017 CLINICAL DATA:  Right flank pain starting 7 hours ago. EXAM: CT ABDOMEN AND PELVIS WITHOUT CONTRAST TECHNIQUE: Multidetector CT imaging of the abdomen and pelvis was performed following the standard protocol without IV contrast. COMPARISON:  Same day KUB FINDINGS: Lower chest: Normal size heart without pericardial effusion. Minimal pleural thickening at the left lung base. No pneumonic consolidation or pneumothorax. Hepatobiliary: No focal liver abnormality is seen given the limitations of a noncontrast study. No gallstones, gallbladder wall thickening, or biliary dilatation. Pancreas: Mild atrophy without focal mass or ductal dilatation. No inflammation. Spleen: Normal Adrenals/Urinary Tract: Normal bilateral adrenal glands. Left-sided perinephric fat stranding with mild hydroureteronephrosis attributable to a calculus measures 7 mm at the pelvic brim. No right-sided hydroureteronephrosis. Bilateral lower pole renal calculi are noted, the largest on the right measuring 1.7 x 1.1 cm adjacent to a 1.5 x 0.7 cm calculus. On the left, there is a lower pole  1.4 x 1.2 cm calculus all measured on the axial plane. 31.1 cm upper pole right renal cyst is noted. Stomach/Bowel: Normal appendix. The stomach, small intestine and large bowel are non acute. Scattered colonic diverticulosis at the junction of the descending sigmoid colon. No acute inflammation. Vascular/Lymphatic: Aortoiliac and branch vessel atherosclerosis. No aneurysm. No  adenopathy. Reproductive: Normal size prostate. Other: No abdominopelvic ascites. Atrophy of the left oblique muscles. Musculoskeletal: Levoscoliosis and lumbar spondylosis. IMPRESSION: 1. 7 mm left ureteral stone with mild left-sided hydroureteronephrosis perinephric fat stranding. 2. Bilateral nonobstructing lower pole renal calculi as above. 3. Simple appearing 1 cm upper pole right renal cyst. 4. Left-sided colonic diverticulosis without acute diverticulitis. Electronically Signed   By: Ashley Royalty M.D.   On: 03/23/2017 03:42    Assessment and Plan:   1. Tachycardia: flutter vs SVT - on arrival in ED, was found to have a HR in the 150s with a rhythm thought to be Afib - when rhythm strips are reviewed, rhythm appears to be regular.   - new onset in the setting of sepsis 2/ left ureteral calculus with enterococcus faecalis, pyelonephritis - diltiazem and lovenox planned by primary team - would hold lovenox for sCr 1.33, agree with heparin drip for now (starting tonight after lovenox given this morning) - telemetry with NSR, no evidence of Afib - EKG with NSR, ventricular rate 58 - diltiazem running at 10 mg/hr - transition this to 240 mg cardizem daily - echo pending, he denies chest pain - primary team trending troponin - initial negative - TSH, K, Mg pending  - pt and son amenable to NOAC   2. AKI - sCr 1.33 (1.33) - hold renal impairing agents - repeat BMP pending - likely secondary to stone(s)   3. HTN - no home meds - sBP 130s on dilt drip - continue to monitor after cardizem PO given - echo will guide additional medications, if needed   For questions or updates, please contact [place] Please consult [email or web address] for contact info under Cardiology/STEMI.   Signed, Ledora Bottcher, [place]  03/25/2017 8:36 AM   History and all data above reviewed.  Patient examined.  I agree with the findings as above.  He has no past cardiac history.  He does not recall any  history of arrhythmia.  He has no palpitations, presyncope or syncope.  He denies any history of chest pain, neck or arm pain.  He is active without SOB.  He has sleep apnea and uses the CPAP "sometimes."  The patient exam reveals COR:RRR  ,  Lungs: Clear  ,  Abd: Positive bowel sounds, no rebound no guarding, Ext No edema  .  All available labs, radiology testing, previous records reviewed. Agree with documented assessment and plan. SVT:  Regular.  I don't see flutter waves.  No indication for anticoagulation with oral agent at this time.  I agree with changing his antihypertensive from pindolol to Cardizem.  Observer on tele.  No indication for echo.  I will cancel this.  I will follow his rhythm further as an out patient.    Jeneen Rinks Madasyn Heath  11:43 AM  03/25/2017

## 2017-03-25 NOTE — Care Management Note (Signed)
Case Management Note  Patient Details  Name: Darrell Perez MRN: [id number] Date of Birth: July 03, 1940  Subjective/Objective:                  76 y.o. male with medical history significant of nephrolithiasis, HTN, GERD, sleep apnea; who presented with complaints of fever and worsening pain. They should notes that initially symptoms started a p.m. on 9/23. Reported associated symptoms of nausea and vomiting. He presented to the emergency department and a CT study was obtained showing a 7 mm obstructing stone on the left. Patient underwent stenting by Dr. Link Snuffer of urology on 9/24. Patient was placed on ciprofloxacin and was thought to be medically stable to be discharged home. To follow-up with Dr. Gloriann Loan or Dr. Laurann Montana in the outpatient setting. However, once patient got home he reported worsening pain on the left lower back that radiated into the left side and groin. Pain was noted as severe and seemed to worsen with trying to urinate. Subsequently patient developed chills and was noted to have fever of 101.45F. His wife called the urologist and they advised him to come to emergency department for further evaluation.  ED Course: Upon admission into the emergency department patient was seen to be febrile up to 100.55F, pulse up to 150s thought to be A. fib with RVR, respirations 16-22, blood pressures 117/93 -160/101, and O2 saturations maintained on room air. Chest x-ray shows signs of vascular congestion with small pleural effusion. Patient was started on diltiazem drip.   Action/Plan: Date:  March 25, 2017 Chart reviewed for concurrent status and case management needs.  Will continue to follow patient progress.  Discharge Planning: following for needs  Expected discharge date: [id number]  Velva Harman, BSN, [place], [place]   Expected Discharge Date:                  Expected Discharge Plan:  Home/Self Care  In-House Referral:     Discharge planning Services  CM  Consult  Post Acute Care Choice:    Choice offered to:     DME Arranged:    DME Agency:     HH Arranged:    HH Agency:     Status of Service:  In process, will continue to follow  If discussed at Long Length of Stay Meetings, dates discussed:    Additional Comments:  Leeroy Cha, RN 03/25/2017, 7:55 AM

## 2017-03-25 NOTE — Progress Notes (Signed)
ANTICOAGULATION CONSULT NOTE - Initial Consult  Pharmacy Consult for enoxaparin Indication: atrial fibrillation  Allergies  Allergen Reactions  . Naprosyn [Naproxen] Other (See Comments)    "makes me jittery/nervous"    Patient Measurements: Height: 5\' 9"  (175.3 cm) Weight: 213 lb 3 oz (96.7 kg) IBW/kg (Calculated) : 70.7 Heparin Dosing Weight:   Vital Signs: Temp: 98.3 F (36.8 C) (09/26 0021) Temp Source: Oral (09/26 0021) BP: 132/68 (09/26 0330) Pulse Rate: 63 (09/26 0330)  Labs:  Recent Labs  03/23/17 0157 03/23/17 7846 03/23/17 1602 03/23/17 2018 03/24/17 0601 03/24/17 2307  HGB 15.9  --   --   --  14.7 15.8  HCT 45.5  --   --   --  43.0 45.5  PLT 260  --   --   --  218 228  LABPROT  --   --   --   --   --  12.9  INR  --   --   --   --   --  0.98  CREATININE 1.25*  --   --   --  1.33* 1.33*  TROPONINI  --  <0.03 0.03* <0.03  --   --     Estimated Creatinine Clearance: 54.2 mL/min (A) (by C-G formula based on SCr of 1.33 mg/dL (H)).   Medical History: Past Medical History:  Diagnosis Date  . Arthritis   . Asthma    15-20 yrs ago  . Dry skin   . Frequency of urination   . GERD (gastroesophageal reflux disease)    NO ISSUES WHEN USING CPAP--  NO MEDS  . Goiter   . H/O hiatal hernia   . History of kidney stones   . Hypertension   . Left knee DJD   . Left ureteral calculus   . Nephrolithiasis 03/23/2017  . Nocturia   . OSA on CPAP    cpap does not know settings   . Urgency of urination   . URI (upper respiratory infection) 11/29/12    see office visit note in chart placed on Zpack    Medications:  Scheduled:  . allopurinol  100 mg Oral Daily  . enoxaparin (LOVENOX) injection  95 mg Subcutaneous NOW  . enoxaparin (LOVENOX) injection  95 mg Subcutaneous BID  . indapamide  2.5 mg Oral QPM  . loratadine  10 mg Oral Daily  . potassium chloride SA  20 mEq Oral BID    Assessment: Patient with afib.  No oral anticoagulants noted on med rec.  Baseline INR WNL.    Goal of Therapy:  Anti-Xa level 0.6-1 units/ml 4hrs after LMWH dose given Monitor platelets by anticoagulation protocol: Yes   Plan:  Enoxaparin 95mg  sq q12hr  Darrell Perez, Darrell Perez 03/25/2017,4:37 AM

## 2017-03-25 NOTE — Progress Notes (Signed)
Pt has declined use of CPAP QHS.  RT to monitor and assess as needed.  

## 2017-03-25 NOTE — Progress Notes (Signed)
[facility] PLAN OF CARE NOTE Patient: Darrell Perez KJI:[id number]   PCP: Lavone Orn, MD DOB: 06-13-1941   DOA: 03/24/2017   DOS: 03/25/2017    Patient was admitted by my colleague Dr. Tamala Julian earlier on 03/25/2017. I have reviewed the H&P as well as assessment and plan and agree with the same. Important changes in the plan are listed below.  Plan of care: Principal Problem:   Sepsis ([place]) Active Problems:   Essential hypertension, benign   Gout, unspecified   Left ureteral calculus   Pyelonephritis  Patient was started on Lovenox overnight, transitioned to heparin and now stopped. Mild elevation of serum troponin is likely demand ischemia. Cardiology does not feel that the patient requires IV or oral anticoagulation. 81 mg aspirin for now. Echocardiogram does not reveal any abnormality on wall motion, EF is preserved as well. Heart rate under control on Cardizem, switching to oral Cardizem. Stable to be transferred out of the stepdown unit back on floor with telemetry.  Author: Berle Mull, MD [facility] Pager: [phone] 03/25/2017 6:31 PM   If 7PM-7AM, please contact night-coverage at [email or web address], password [facility]

## 2017-03-25 NOTE — Progress Notes (Signed)
Pharmacy Antibiotic Note  Darrell Perez is a 76 y.o. male admitted on 03/24/2017 with UTI.  Pharmacy has been consulted for Ampicillin dosing.  Plan: Ampicillin 1gm iv q6hr  Height: 5\' 9"  (175.3 cm) Weight: 213 lb 3 oz (96.7 kg) IBW/kg (Calculated) : 70.7  Temp (24hrs), Avg:98.8 F (37.1 C), Min:98 F (36.7 C), Max:100.9 F (38.3 C)   Recent Labs Lab 03/23/17 0157 03/24/17 0601 03/24/17 2307 03/24/17 2319  WBC 9.9 18.7* 21.4*  --   CREATININE 1.25* 1.33* 1.33*  --   LATICACIDVEN  --   --   --  1.04    Estimated Creatinine Clearance: 54.2 mL/min (A) (by C-G formula based on SCr of 1.33 mg/dL (H)).    Allergies  Allergen Reactions  . Naprosyn [Naproxen] Other (See Comments)    "makes me jittery/nervous"    Antimicrobials this admission: Ampicillin 03-24-2017 >>  Dose adjustments this admission: -  Microbiology results: pending  Thank you for allowing pharmacy to be a part of this patient's care.  Nani Skillern [place] 03/25/2017 6:54 AM

## 2017-03-25 NOTE — Progress Notes (Signed)
Report received from Linwood Dibbles, RN. Patient transferred in stable condition. No change from initial pm assessment. Will continue to monitor and follow the plan of care.

## 2017-03-25 NOTE — Consult Note (Signed)
Consult: Urinary tract infection, fever, left ureteral stone/stent Requested by: Dr. Montine Circle  History of Present Illness: 76 year old male patient of mine who underwent urgent left ureteral stent with Dr. Gloriann Loan March 23, 2017 her urinary tract infection and a left mid ureteral stone of 7 mm.  He was discharged on Cipro, but had malaise and felt poorly.  He also had fever. In the emergency department last night he was found to be in A. fib with RVR.  He's been resuscitated and feeling much better today.  He continues a diltiazem drip.    Urine culture is growing enterococcus with sensitivities pending.  He had been on Rocephin while in hospital.   His white count went from 21,000-15,000.  His creatinine has improved to 1.2.  A KUB was done which showed the left stent to be in good position. I reviewed the image and compared to CT images. He is voiding with a good stream and feels empty.    Past Medical History:  Diagnosis Date  . Arthritis   . Asthma    15-20 yrs ago  . GERD (gastroesophageal reflux disease)    NO ISSUES WHEN USING CPAP--  NO MEDS  . Goiter   . H/O hiatal hernia   . Hypertension   . Left knee DJD   . Left ureteral calculus   . Nephrolithiasis 03/23/2017  . OSA on CPAP    cpap does not know settings    Past Surgical History:  Procedure Laterality Date  . CARPAL TUNNEL RELEASE Bilateral [place]   . CATARACT EXTRACTION W/ INTRAOCULAR LENS  IMPLANT, BILATERAL  2005  &  2012  . CYSTOSCOPY WITH STENT PLACEMENT Left 03/23/2017   Procedure: CYSTOSCOPY, LEFT RETROGRADE WITH STENT PLACEMENT;  Surgeon: Lucas Mallow, MD;  Location: [facility];  Service: Urology;  Laterality: Left;  . EXTRACORPOREAL SHOCK WAVE LITHOTRIPSY     MULTIPLE  . HEMIARTHROPLASTY RIGHT SHOULDER  2007  . HOLMIUM LASER APPLICATION Left 02/15/2992   Procedure: HOLMIUM LASER APPLICATION;  Surgeon: Fredricka Bonine, MD;  Location: [facility];  Service: Urology;   Laterality: Left;  . [place]   removal of stones. (OPEN PROCUDURE)  . REVERSE SHOULDER ARTHROPLASTY  10/09/2011   Procedure: REVERSE SHOULDER ARTHROPLASTY;  Surgeon: Marin Shutter, MD;  Location: [place];  Service: Orthopedics;  Laterality: Right;  right hardware removal and reverse shoulder arthroplasty  . THYROID LOBECTOMY  1967   goiter removed  . TONSILLECTOMY    . TOTAL KNEE ARTHROPLASTY  07/22/2012   Procedure: TOTAL KNEE ARTHROPLASTY;  Surgeon: Johnn Hai, MD;  Location: [facility];  Service: Orthopedics;  Laterality: Right;  . TOTAL KNEE ARTHROPLASTY Left 12/16/2012   Procedure: LEFT TOTAL KNEE ARTHROPLASTY;  Surgeon: Johnn Hai, MD;  Location: [facility];  Service: Orthopedics;  Laterality: Left;  . TRIGGER FINGER RELEASE Right 2006  . [place] EXTRACTION  2002    Home Medications:  Prescriptions Prior to Admission  Medication Sig Dispense Refill Last Dose  . acetaminophen (TYLENOL) 500 MG tablet Take by mouth every 6 (six) hours as needed for mild pain, moderate pain, fever or headache.    03/24/2017 at Unknown time  . allopurinol (ZYLOPRIM) 100 MG tablet Take 100 mg by mouth daily.    03/24/2017 at Unknown time  . aspirin EC 81 MG tablet Take 81 mg by mouth daily.   03/24/2017 at Unknown time  . cholecalciferol (VITAMIN  D) 1000 UNITS tablet Take 1,000 Units by mouth daily.   03/24/2017 at Unknown time  . ciprofloxacin (CIPRO) 500 MG tablet Take 1 tablet (500 mg total) by mouth 2 (two) times daily. For 5days 10 tablet 0 03/25/2017 at Unknown time  . HYDROcodone-acetaminophen (NORCO/VICODIN) 5-325 MG tablet Take 1 tablet by mouth every 6 (six) hours as needed for moderate pain.   Past Week at Unknown time  . hyoscyamine (ANASPAZ) 0.125 MG TBDP disintergrating tablet Place 0.125 mg under the tongue every 6 (six) hours as needed for cramping.   unknown  . indapamide (LOZOL) 2.5 MG tablet Take 2.5 mg by mouth every evening.    03/24/2017 at Unknown time   . loperamide (IMODIUM A-D) 2 MG tablet Take 2-4 mg by mouth 4 (four) times daily as needed for diarrhea or loose stools.   unknown  . loratadine (CLARITIN) 10 MG tablet Take 10 mg by mouth daily.   03/24/2017 at Unknown time  . pindolol (VISKEN) 5 MG tablet Take 5 mg by mouth every evening.    03/23/2017 at 1700  . potassium chloride SA (K-DUR,KLOR-CON) 20 MEQ tablet Take 20 mEq by mouth 2 (two) times daily.   03/24/2017 at Unknown time  . vitamin B-12 (CYANOCOBALAMIN) 1000 MCG tablet Take 1,000 mcg by mouth daily.   03/24/2017 at Unknown time   Allergies:  Allergies  Allergen Reactions  . Naprosyn [Naproxen] Other (See Comments)    "makes me jittery/nervous"    Family History  Problem Relation Age of Onset  . Dementia Mother   . Dementia Father   . Anesthesia problems Neg Hx   . Hypotension Neg Hx   . Malignant hyperthermia Neg Hx   . Pseudochol deficiency Neg Hx    Social History:  reports that he has never smoked. He has never used smokeless tobacco. He reports that he does not drink alcohol or use drugs.  ROS: A complete review of systems was performed.  All systems are negative except for pertinent findings as noted. ROS   Physical Exam:  Vital signs in last 24 hours: Temp:  [98 F (36.7 C)-101 F (38.3 C)] 98.3 F (36.8 C) (09/26 1347) Pulse Rate:  [40-144] 68 (09/26 1400) Resp:  [17-25] 21 (09/26 1400) BP: (101-179)/(45-120) 130/45 (09/26 1400) SpO2:  [94 %-97 %] 95 % (09/26 1400) Weight:  [96.7 kg (213 lb 3 oz)] 96.7 kg (213 lb 3 oz) (09/26 0422) General:  Alert and oriented, No acute distress, sitting in a chair  HEENT: Normocephalic, atraumatic Neck: No JVD or lymphadenopathy Cardiovascular: Regular rate and rhythm Lungs: Regular rate and effort Abdomen: Soft, nontender, nondistended, no abdominal masses Back: No CVA tenderness Extremities: No edema Neurologic: Grossly intact  Laboratory Data:  Results for orders placed or performed during the hospital  encounter of 03/24/17 (from the past 24 hour(s))  Comprehensive metabolic panel     Status: Abnormal   Collection Time: 03/24/17 11:07 PM  Result Value Ref Range   Sodium 139 135 - 145 mmol/L   Potassium 3.5 3.5 - 5.1 mmol/L   Chloride 104 101 - 111 mmol/L   CO2 25 22 - 32 mmol/L   Glucose, Bld 92 65 - 99 mg/dL   BUN 27 (H) 6 - 20 mg/dL   Creatinine, Ser 1.33 (H) 0.61 - 1.24 mg/dL   Calcium 8.8 (L) 8.9 - 10.3 mg/dL   Total Protein 6.9 6.5 - 8.1 g/dL   Albumin 3.5 3.5 - 5.0 g/dL   AST 36 15 -  41 U/L   ALT 28 17 - 63 U/L   Alkaline Phosphatase 45 38 - 126 U/L   Total Bilirubin 0.7 0.3 - 1.2 mg/dL   GFR calc non Af Amer 50 (L) >60 mL/min   GFR calc Af Amer 58 (L) >60 mL/min   Anion gap 10 5 - 15  CBC with Differential     Status: Abnormal   Collection Time: 03/24/17 11:07 PM  Result Value Ref Range   WBC 21.4 (H) 4.0 - 10.5 K/uL   RBC 5.13 4.22 - 5.81 MIL/uL   Hemoglobin 15.8 13.0 - 17.0 g/dL   HCT 45.5 39.0 - 52.0 %   MCV 88.7 78.0 - 100.0 fL   MCH 30.8 26.0 - 34.0 pg   MCHC 34.7 30.0 - 36.0 g/dL   RDW 14.5 11.5 - 15.5 %   Platelets 228 150 - 400 K/uL   Neutrophils Relative % 85 %   Neutro Abs 18.2 (H) 1.7 - 7.7 K/uL   Lymphocytes Relative 5 %   Lymphs Abs 1.0 0.7 - 4.0 K/uL   Monocytes Relative 10 %   Monocytes Absolute 2.1 (H) 0.1 - 1.0 K/uL   Eosinophils Relative 0 %   Eosinophils Absolute 0.0 0.0 - 0.7 K/uL   Basophils Relative 0 %   Basophils Absolute 0.0 0.0 - 0.1 K/uL  Protime-INR     Status: None   Collection Time: 03/24/17 11:07 PM  Result Value Ref Range   Prothrombin Time 12.9 11.4 - 15.2 seconds   INR 0.98   CG4 I-STAT (Lactic acid)     Status: None   Collection Time: 03/24/17 11:19 PM  Result Value Ref Range   Lactic Acid, Venous 1.04 0.5 - 1.9 mmol/L  Urinalysis, Routine w reflex microscopic     Status: Abnormal   Collection Time: 03/24/17 11:27 PM  Result Value Ref Range   Color, Urine YELLOW YELLOW   APPearance HAZY (A) CLEAR   Specific Gravity,  Urine 1.014 1.005 - 1.030   pH 5.0 5.0 - 8.0   Glucose, UA NEGATIVE NEGATIVE mg/dL   Hgb urine dipstick LARGE (A) NEGATIVE   Bilirubin Urine NEGATIVE NEGATIVE   Ketones, ur NEGATIVE NEGATIVE mg/dL   Protein, ur 30 (A) NEGATIVE mg/dL   Nitrite NEGATIVE NEGATIVE   Leukocytes, UA MODERATE (A) NEGATIVE   RBC / HPF TOO NUMEROUS TO COUNT 0 - 5 RBC/hpf   WBC, UA TOO NUMEROUS TO COUNT 0 - 5 WBC/hpf   Bacteria, UA RARE (A) NONE SEEN   Squamous Epithelial / LPF NONE SEEN NONE SEEN   Mucus PRESENT    Hyaline Casts, UA PRESENT   MRSA PCR Screening     Status: None   Collection Time: 03/25/17  4:15 AM  Result Value Ref Range   MRSA by PCR NEGATIVE NEGATIVE  CBC     Status: Abnormal   Collection Time: 03/25/17  9:08 AM  Result Value Ref Range   WBC 15.8 (H) 4.0 - 10.5 K/uL   RBC 4.70 4.22 - 5.81 MIL/uL   Hemoglobin 14.0 13.0 - 17.0 g/dL   HCT 41.1 39.0 - 52.0 %   MCV 87.4 78.0 - 100.0 fL   MCH 29.8 26.0 - 34.0 pg   MCHC 34.1 30.0 - 36.0 g/dL   RDW 14.7 11.5 - 15.5 %   Platelets 207 150 - 400 K/uL  Magnesium     Status: None   Collection Time: 03/25/17  9:08 AM  Result Value Ref Range  Magnesium 1.8 1.7 - 2.4 mg/dL  TSH     Status: None   Collection Time: 03/25/17  9:15 AM  Result Value Ref Range   TSH 0.439 0.350 - 4.500 uIU/mL  Basic metabolic panel     Status: Abnormal   Collection Time: 03/25/17  9:15 AM  Result Value Ref Range   Sodium 138 135 - 145 mmol/L   Potassium 3.2 (L) 3.5 - 5.1 mmol/L   Chloride 104 101 - 111 mmol/L   CO2 24 22 - 32 mmol/L   Glucose, Bld 125 (H) 65 - 99 mg/dL   BUN 24 (H) 6 - 20 mg/dL   Creatinine, Ser 1.21 0.61 - 1.24 mg/dL   Calcium 8.1 (L) 8.9 - 10.3 mg/dL   GFR calc non Af Amer 56 (L) >60 mL/min   GFR calc Af Amer >60 >60 mL/min   Anion gap 10 5 - 15  Troponin I     Status: Abnormal   Collection Time: 03/25/17  9:15 AM  Result Value Ref Range   Troponin I 0.22 (HH) <0.03 ng/mL   Recent Results (from the past 240 hour(s))  Urine culture      Status: Abnormal   Collection Time: 03/23/17  1:31 AM  Result Value Ref Range Status   Specimen Description Urine  Final   Special Requests NONE  Final   Culture >=100,000 COLONIES/mL ENTEROCOCCUS FAECALIS (A)  Final   Report Status 03/25/2017 FINAL  Final   Organism ID, Bacteria ENTEROCOCCUS FAECALIS (A)  Final      Susceptibility   Enterococcus faecalis - MIC*    AMPICILLIN <=2 SENSITIVE Sensitive     VANCOMYCIN 2 SENSITIVE Sensitive     GENTAMICIN SYNERGY SENSITIVE Sensitive     * >=100,000 COLONIES/mL ENTEROCOCCUS FAECALIS  MRSA PCR Screening     Status: None   Collection Time: 03/25/17  4:15 AM  Result Value Ref Range Status   MRSA by PCR NEGATIVE NEGATIVE Final    Comment:        The GeneXpert MRSA Assay (FDA approved for NASAL specimens only), is one component of a comprehensive MRSA colonization surveillance program. It is not intended to diagnose MRSA infection nor to guide or monitor treatment for MRSA infections.    Creatinine:  Recent Labs  03/23/17 0157 03/24/17 0601 03/24/17 2307 03/25/17 0915  CREATININE 1.25* 1.33* 1.33* 1.21    Impression/Assessment/plan: Urine tract infection-patient responding well to IV antibiotics.  Sensitivities pending.  These responded well to treatment and therefore I don't feel there is anything more sinister going on such as an abscess or stent malposition.  Ureteral stone, left-status post left ureteral stent.  Stent appears to be in good position on the KUB. He'll need ureteroscopy down the road.  Will follow.  Appreciate excellent TRH care.   Aleeya Veitch 03/25/2017, 3:52 PM

## 2017-03-25 NOTE — ED Provider Notes (Signed)
[facility] DEPT Provider Note   CSN: [id number] Arrival date & time: 03/24/17  2138     History   Chief Complaint Chief Complaint  Patient presents with  . Pyelonephritis    HPI Darrell Perez is a 76 y.o. male.  Patient presents to the emergency department with chief complaint of flank pain and urinary urgency, frequency, and dysuria. He was recently admitted to the hospital for a kidney stone, and subsequently underwent stenting. He was noted to have a fever and urinalysis concerning for UTI. Patient states that he was discharged today on Cipro. He states that he has had worsening of his symptoms as well as elevation of his temperature to 101.5. He states that he called his urologist, and was instructed to come to the emergency department for repeat evaluation. He denies any chest pain, shortness of breath, or abdominal pain. States that his flank pain is a 2 out of 10, but that he does have severe pain when he tries to urinate.   The history is provided by the patient. No language interpreter was used.    Past Medical History:  Diagnosis Date  . Arthritis   . Asthma    15-20 yrs ago  . Dry skin   . Frequency of urination   . GERD (gastroesophageal reflux disease)    NO ISSUES WHEN USING CPAP--  NO MEDS  . Goiter   . H/O hiatal hernia   . History of kidney stones   . Hypertension   . Left knee DJD   . Left ureteral calculus   . Nephrolithiasis 03/23/2017  . Nocturia   . OSA on CPAP    cpap does not know settings   . Urgency of urination   . URI (upper respiratory infection) 11/29/12    see office visit note in chart placed on Zpack    Patient Active Problem List   Diagnosis Date Noted  . Ureteral stone 03/23/2017  . Nephrolithiasis 03/23/2017  . BPH (benign prostatic hypertrophy) 02/03/2013  . Extrinsic asthma, unspecified 01/27/2013  . Essential hypertension, benign 01/27/2013  . Gout, unspecified 01/27/2013  . 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 Right knee DJD  07/22/2012    Past Surgical History:  Procedure Laterality Date  . CARPAL TUNNEL RELEASE Bilateral [place]   . CATARACT EXTRACTION W/ INTRAOCULAR LENS  IMPLANT, BILATERAL  2005  &  2012  . CYSTOSCOPY WITH STENT PLACEMENT Left 03/23/2017   Procedure: CYSTOSCOPY, LEFT RETROGRADE WITH STENT PLACEMENT;  Surgeon: Lucas Mallow, MD;  Location: [facility];  Service: Urology;  Laterality: Left;  . EXTRACORPOREAL SHOCK WAVE LITHOTRIPSY     MULTIPLE  . HEMIARTHROPLASTY RIGHT SHOULDER  2007  . HOLMIUM LASER APPLICATION Left 3/41/9622   Procedure: HOLMIUM LASER APPLICATION;  Surgeon: Fredricka Bonine, MD;  Location: [facility]  Service: Urology;  Laterality: Left;  . [place]   removal of stones. (OPEN PROCUDURE)  . REVERSE SHOULDER ARTHROPLASTY  10/09/2011   Procedure: REVERSE SHOULDER ARTHROPLASTY;  Surgeon: Marin Shutter, MD;  Location: [facility];  Service: Orthopedics;  Laterality: Right;  right hardware removal and reverse shoulder arthroplasty  . THYROID LOBECTOMY  1967   goiter removed  . TONSILLECTOMY    . TOTAL KNEE ARTHROPLASTY  07/22/2012   Procedure: TOTAL KNEE ARTHROPLASTY;  Surgeon: Johnn Hai, MD;  Location: [facility];  Service: Orthopedics;  Laterality: Right;  . TOTAL KNEE ARTHROPLASTY Left 12/16/2012  Procedure: LEFT TOTAL KNEE ARTHROPLASTY;  Surgeon: Johnn Hai, MD;  Location: [facility];  Service: Orthopedics;  Laterality: Left;  . TRIGGER FINGER RELEASE Right 2006  . [place] EXTRACTION  2002       Home Medications    Prior to Admission medications   Medication Sig Start Date End Date Taking? Authorizing Provider  acetaminophen (TYLENOL) 500 MG tablet Take by mouth every 6 (six) hours as needed for mild pain, moderate pain, fever or headache.    Yes [provider]  allopurinol (ZYLOPRIM) 100 MG tablet Take 100 mg by mouth daily.    Yes [provider]  aspirin EC 81 MG tablet Take 81 mg  by mouth daily.   Yes [provider]  cholecalciferol (VITAMIN D) 1000 UNITS tablet Take 1,000 Units by mouth daily.   Yes [provider]  ciprofloxacin (CIPRO) 500 MG tablet Take 1 tablet (500 mg total) by mouth 2 (two) times daily. For 5days 03/24/17  Yes Domenic Polite, MD  HYDROcodone-acetaminophen (NORCO/VICODIN) 5-325 MG tablet Take 1 tablet by mouth every 6 (six) hours as needed for moderate pain.   Yes [provider]  hyoscyamine (ANASPAZ) 0.125 MG TBDP disintergrating tablet Place 0.125 mg under the tongue every 6 (six) hours as needed for cramping.   Yes [provider]  indapamide (LOZOL) 2.5 MG tablet Take 2.5 mg by mouth every evening.    Yes [provider]  loperamide (IMODIUM A-D) 2 MG tablet Take 2-4 mg by mouth 4 (four) times daily as needed for diarrhea or loose stools.   Yes [provider]  loratadine (CLARITIN) 10 MG tablet Take 10 mg by mouth daily.   Yes [provider]  pindolol (VISKEN) 5 MG tablet Take 5 mg by mouth every evening.    Yes [provider]  potassium chloride SA (K-DUR,KLOR-CON) 20 MEQ tablet Take 20 mEq by mouth 2 (two) times daily.   Yes [provider]  vitamin B-12 (CYANOCOBALAMIN) 1000 MCG tablet Take 1,000 mcg by mouth daily.   Yes [provider]    Family History Family History  Problem Relation Age of Onset  . Dementia Mother   . Dementia Father   . Anesthesia problems Neg Hx   . Hypotension Neg Hx   . Malignant hyperthermia Neg Hx   . Pseudochol deficiency Neg Hx     Social History Social History  Substance Use Topics  . Smoking status: Never Smoker  . Smokeless tobacco: Never Used  . Alcohol use No     Allergies   Naprosyn [naproxen]   Review of Systems Review of Systems  All other systems reviewed and are negative.    Physical Exam Updated Vital Signs BP (!) 117/93   Pulse (!) 144   Temp 98.3 F (36.8 C) (Oral)   Resp 20    SpO2 94%   Physical Exam  Constitutional: He is oriented to person, place, and time. He appears well-developed and well-nourished.  HENT:  Head: Normocephalic and atraumatic.  Eyes: Pupils are equal, round, and reactive to light. Conjunctivae and EOM are normal. Right eye exhibits no discharge. Left eye exhibits no discharge. No scleral icterus.  Neck: Normal range of motion. Neck supple. No JVD present.  Cardiovascular: Normal rate, regular rhythm and normal heart sounds.  Exam reveals no gallop and no friction rub.   No murmur heard. Pulmonary/Chest: Effort normal and breath sounds normal. No respiratory distress. He has no wheezes. He has no rales.  He exhibits no tenderness.  Abdominal: Soft. He exhibits no distension and no mass. There is no tenderness. There is no rebound and no guarding.  Musculoskeletal: Normal range of motion. He exhibits no edema or tenderness.  Neurological: He is alert and oriented to person, place, and time.  Skin: Skin is warm and dry.  Psychiatric: He has a normal mood and affect. His behavior is normal. Judgment and thought content normal.  Nursing note and vitals reviewed.    ED Treatments / Results  Labs (all labs ordered are listed, but only abnormal results are displayed) Labs Reviewed  COMPREHENSIVE METABOLIC PANEL - Abnormal; Notable for the following:       Result Value   BUN 27 (*)    Creatinine, Ser 1.33 (*)    Calcium 8.8 (*)    GFR calc non Af Amer 50 (*)    GFR calc Af Amer 58 (*)    All other components within normal limits  CBC WITH DIFFERENTIAL/PLATELET - Abnormal; Notable for the following:    WBC 21.4 (*)    Neutro Abs 18.2 (*)    Monocytes Absolute 2.1 (*)    All other components within normal limits  URINALYSIS, ROUTINE W REFLEX MICROSCOPIC - Abnormal; Notable for the following:    APPearance HAZY (*)    Hgb urine dipstick LARGE (*)    Protein, ur 30 (*)    Leukocytes, UA MODERATE (*)    Bacteria, UA RARE (*)    All other  components within normal limits  CULTURE, BLOOD (ROUTINE X 2)  CULTURE, BLOOD (ROUTINE X 2)  PROTIME-INR  I-STAT CG4 LACTIC ACID, ED  CG4 I-STAT (LACTIC ACID)  I-STAT CG4 LACTIC ACID, ED    EKG  EKG Interpretation None       Radiology Dg Chest 2 View  Result Date: 03/24/2017 CLINICAL DATA:  Suspected sepsis. Pain fever and weakness. Recent ureteral stent placement. EXAM: CHEST  2 VIEW COMPARISON:  Chest radiographs 12/08/2012 FINDINGS: The cardiomediastinal contours are normal. Atherosclerosis of the aortic arch. Mild vascular congestion. Suspect small pleural effusions. No confluent airspace disease. No pneumothorax. Reverse right shoulder arthroplasty, partially included. Degenerative change in the left shoulder and throughout the spine. No acute osseous abnormalities are seen. IMPRESSION: Vascular congestion.  Suspect small pleural effusions. Electronically Signed   By: Jeb Levering M.D.   On: 03/24/2017 23:37   Dg Abdomen 1 View  Addendum Date: 03/23/2017   ADDENDUM REPORT: 03/23/2017 03:44 ADDENDUM: Additionally, there is a 6 mm rounded calcification projecting over left SI joint confirmed to be ureteral stone on subsequent CT. Electronically Signed   By: Ashley Royalty M.D.   On: 03/23/2017 03:44   Result Date: 03/23/2017 CLINICAL DATA:  Bilateral renal stones. Right flank pain starting 6 hours ago. EXAM: ABDOMEN - 1 VIEW COMPARISON:  07/07/2016 abdomen radiographs. FINDINGS: Clusters of calcifications are again noted of both kidneys projecting over the lower poles, the largest cluster on the right in aggregate is estimated at 2.6 x 1.4 cm and unchanged in appearance. The largest calcification noted on the left also projects over the lower pole measuring 1.9 x 1.2 cm. A previously seen 0.5 cm upper pole calcification is no longer apparent on the left. 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 No calcifications are identified along the course of the expected  ureters. No bladder calculi are apparent. Bowel gas pattern is unremarkable apart from increased colonic stool burden within cecum and ascending colon to the hepatic flexure.  IMPRESSION: 1. Bilateral stable lower pole renal calculi. No definite ureteral stones or bladder calcifications. 2. Increased stool burden within the cecum and ascending colon. 3. Levoscoliosis with lumbar spondylosis, stable in appearance. Electronically Signed: By: Ashley Royalty M.D. On: 03/23/2017 02:40   Dg C-arm 1-60 Min-no Report  Result Date: 03/23/2017 Fluoroscopy was utilized by the requesting physician.  No radiographic interpretation.   Ct Renal Stone Study  Result Date: 03/23/2017 CLINICAL DATA:  Right flank pain starting 7 hours ago. EXAM: CT ABDOMEN AND PELVIS WITHOUT CONTRAST TECHNIQUE: Multidetector CT imaging of the abdomen and pelvis was performed following the standard protocol without IV contrast. COMPARISON:  Same day KUB FINDINGS: Lower chest: Normal size heart without pericardial effusion. Minimal pleural thickening at the left lung base. No pneumonic consolidation or pneumothorax. Hepatobiliary: No focal liver abnormality is seen given the limitations of a noncontrast study. No gallstones, gallbladder wall thickening, or biliary dilatation. Pancreas: Mild atrophy without focal mass or ductal dilatation. No inflammation. Spleen: Normal Adrenals/Urinary Tract: Normal bilateral adrenal glands. Left-sided perinephric fat stranding with mild hydroureteronephrosis attributable to a calculus measures 7 mm at the pelvic brim. No right-sided hydroureteronephrosis. Bilateral lower pole renal calculi are noted, the largest on the right measuring 1.7 x 1.1 cm adjacent to a 1.5 x 0.7 cm calculus. On the left, there is a lower pole 1.4 x 1.2 cm calculus all measured on the axial plane. 31.1 cm upper pole right renal cyst is noted. Stomach/Bowel: Normal appendix. The stomach, small intestine and large bowel are non acute.  Scattered colonic diverticulosis at the junction of the descending sigmoid colon. No acute inflammation. Vascular/Lymphatic: Aortoiliac and branch vessel atherosclerosis. No aneurysm. No adenopathy. Reproductive: Normal size prostate. Other: No abdominopelvic ascites. Atrophy of the left oblique muscles. Musculoskeletal: Levoscoliosis and lumbar spondylosis. IMPRESSION: 1. 7 mm left ureteral stone with mild left-sided hydroureteronephrosis perinephric fat stranding. 2. Bilateral nonobstructing lower pole renal calculi as above. 3. Simple appearing 1 cm upper pole right renal cyst. 4. Left-sided colonic diverticulosis without acute diverticulitis. Electronically Signed   By: Ashley Royalty M.D.   On: 03/23/2017 03:42    Procedures Procedures (including critical care time) CRITICAL CARE Performed by: Montine Circle   Total critical care time: 35 minutes  Critical care time was exclusive of separately billable procedures and treating other patients.  Critical care was necessary to treat or prevent imminent or life-threatening deterioration.  Critical care was time spent personally by me on the following activities: development of treatment plan with patient and/or surrogate as well as nursing, discussions with consultants, evaluation of patient's response to treatment, examination of patient, obtaining history from patient or surrogate, ordering and performing treatments and interventions, ordering and review of laboratory studies, ordering and review of radiographic studies, pulse oximetry and re-evaluation of patient's condition.  Medications Ordered in ED Medications  diltiazem (CARDIZEM) 1 mg/mL load via infusion 20 mg (not administered)    And  diltiazem (CARDIZEM) 100 mg in dextrose 5% 162mL (1 mg/mL) infusion (not administered)  acetaminophen (TYLENOL) tablet 650 mg (650 mg Oral Given 03/24/17 2257)  ampicillin (OMNIPEN) 1 g in sodium chloride 0.9 % 50 mL IVPB (1 g Intravenous New Bag/Given  03/25/17 0110)     Initial Impression / Assessment and Plan / ED Course  I have reviewed the triage vital signs and the nursing notes.  Pertinent labs & imaging results that were available during my care of the patient were reviewed by me and considered in my medical decision  making (see chart for details).     Patient with known renal stone, recently underwent stenting by urology (Dr. Gloriann Loan). Patient was discharged from hospital earlier today, but his symptoms worsen.He called his urologist, and was instructed to come back to the emergency department for repeat evaluation. Patient is noted to be mildly febrile at 100.7. His remaining vital signs are stable. Review of urine culture shows that the patient has grown enterococcus faecallis, sensitivities have not resulted. I iscussed this with Dr. Junious Silk from urology, who recommends IV ampicillin and admission.  Patient discussed with Dr. Stark Jock, who agrees with the plan.  When to notify the patient of the plan and pending admission. Patient is noted to have rapid irregular heart rate on monitor. His heart rate is between 130s to 150s. EKG obtained and shows A. Fib with RVR. Patient has no history of the same. He is not anticoagulated.  Patient discussed with Dr. Stark Jock again, who recommends starting cardizem for rate control and admission to the hospital.   Appreciate Dr. Tamala Julian for admitting the patient.  Final Clinical Impressions(s) / ED Diagnoses   Final diagnoses:  Urinary tract infection with hematuria, site unspecified  New onset a-fib [facility])    New Prescriptions New Prescriptions   No medications on file     Montine Circle, Hershal Coria 03/25/17 0143    Veryl Speak, MD 03/25/17 [phone]

## 2017-03-25 NOTE — Progress Notes (Signed)
CRITICAL VALUE ALERT  Critical Value:  Troponin 0.22  Date & Time Notied:  03/25/2017 1042  Provider Notified: Posey Pronto, MD  Orders Received/Actions taken: Continue to monitor the patient

## 2017-03-26 ENCOUNTER — Other Ambulatory Visit: Payer: Self-pay

## 2017-03-26 DIAGNOSIS — I483 Typical atrial flutter: Secondary | ICD-10-CM

## 2017-03-26 LAB — COMPREHENSIVE METABOLIC PANEL
ALT: 22 U/L (ref 17–63)
AST: 23 U/L (ref 15–41)
Albumin: 3 g/dL — ABNORMAL LOW (ref 3.5–5.0)
Alkaline Phosphatase: 36 U/L — ABNORMAL LOW (ref 38–126)
Anion gap: 8 (ref 5–15)
BILIRUBIN TOTAL: 0.8 mg/dL (ref 0.3–1.2)
BUN: 19 mg/dL (ref 6–20)
CO2: 26 mmol/L (ref 22–32)
CREATININE: 1.08 mg/dL (ref 0.61–1.24)
Calcium: 8.1 mg/dL — ABNORMAL LOW (ref 8.9–10.3)
Chloride: 104 mmol/L (ref 101–111)
Glucose, Bld: 117 mg/dL — ABNORMAL HIGH (ref 65–99)
POTASSIUM: 3.2 mmol/L — AB (ref 3.5–5.1)
Sodium: 138 mmol/L (ref 135–145)
TOTAL PROTEIN: 6.1 g/dL — AB (ref 6.5–8.1)

## 2017-03-26 LAB — CBC WITH DIFFERENTIAL/PLATELET
BASOS ABS: 0 10*3/uL (ref 0.0–0.1)
BASOS PCT: 0 %
EOS ABS: 0.1 10*3/uL (ref 0.0–0.7)
Eosinophils Relative: 1 %
HCT: 43.2 % (ref 39.0–52.0)
Hemoglobin: 14.8 g/dL (ref 13.0–17.0)
Lymphocytes Relative: 9 %
Lymphs Abs: 1.1 10*3/uL (ref 0.7–4.0)
MCH: 30.3 pg (ref 26.0–34.0)
MCHC: 34.3 g/dL (ref 30.0–36.0)
MCV: 88.5 fL (ref 78.0–100.0)
Monocytes Absolute: 1.5 10*3/uL — ABNORMAL HIGH (ref 0.1–1.0)
Monocytes Relative: 12 %
Neutro Abs: 9.8 10*3/uL — ABNORMAL HIGH (ref 1.7–7.7)
Neutrophils Relative %: 78 %
Platelets: 205 10*3/uL (ref 150–400)
RBC: 4.88 MIL/uL (ref 4.22–5.81)
RDW: 14.5 % (ref 11.5–15.5)
WBC: 12.6 10*3/uL — ABNORMAL HIGH (ref 4.0–10.5)

## 2017-03-26 LAB — T4, FREE: FREE T4: 1.02 ng/dL (ref 0.61–1.12)

## 2017-03-26 LAB — MAGNESIUM: MAGNESIUM: 1.7 mg/dL (ref 1.7–2.4)

## 2017-03-26 LAB — PROTIME-INR
INR: 1.06
PROTHROMBIN TIME: 13.7 s (ref 11.4–15.2)

## 2017-03-26 MED ORDER — POTASSIUM CHLORIDE CRYS ER 20 MEQ PO TBCR: 20.0000 meq | EXTENDED_RELEASE_TABLET | Freq: Once | ORAL | Status: DC

## 2017-03-26 MED ORDER — BELLADONNA ALKALOIDS-OPIUM 16.2-60 MG RE SUPP
1.0000 | Freq: Once | RECTAL | Status: AC
  Administered 2017-03-26: 1 via RECTAL

## 2017-03-26 MED ORDER — DILTIAZEM HCL ER COATED BEADS 180 MG PO CP24
300.0000 mg | ORAL_CAPSULE | Freq: Every day | ORAL | Status: DC
  Administered 2017-03-27: 09:00:00 300 mg via ORAL
  Filled 2017-03-26: qty 1

## 2017-03-26 MED ORDER — MAGNESIUM SULFATE 2 GM/50ML IV SOLN
2.0000 g | Freq: Once | INTRAVENOUS | Status: AC
  Administered 2017-03-26: 2 g via INTRAVENOUS
  Filled 2017-03-26: qty 50

## 2017-03-26 MED ORDER — POLYETHYLENE GLYCOL 3350 17 G PO PACK
17.0000 g | PACK | Freq: Every day | ORAL | Status: DC
  Administered 2017-03-26: 17 g via ORAL
  Filled 2017-03-26: qty 1

## 2017-03-26 MED ORDER — DILTIAZEM HCL 60 MG PO TABS
60.0000 mg | ORAL_TABLET | Freq: Four times a day (QID) | ORAL | Status: AC
  Administered 2017-03-26 – 2017-03-27 (×3): 60 mg via ORAL
  Filled 2017-03-26 (×3): qty 1

## 2017-03-26 MED ORDER — HYOSCYAMINE SULFATE 0.125 MG SL SUBL
0.1250 mg | SUBLINGUAL_TABLET | Freq: Four times a day (QID) | SUBLINGUAL | Status: DC | PRN
  Filled 2017-03-26: qty 1

## 2017-03-26 MED ORDER — SODIUM CHLORIDE 0.9 % IV SOLN
2.0000 g | Freq: Four times a day (QID) | INTRAVENOUS | Status: DC
  Administered 2017-03-26 – 2017-03-27 (×4): 2 g via INTRAVENOUS
  Filled 2017-03-26 (×5): qty 2000

## 2017-03-26 MED ORDER — POTASSIUM CHLORIDE CRYS ER 20 MEQ PO TBCR
40.0000 meq | EXTENDED_RELEASE_TABLET | Freq: Once | ORAL | Status: AC
  Administered 2017-03-26: 40 meq via ORAL
  Filled 2017-03-26: qty 2

## 2017-03-26 MED ORDER — APIXABAN 5 MG PO TABS
5.0000 mg | ORAL_TABLET | Freq: Two times a day (BID) | ORAL | Status: DC
  Administered 2017-03-26 – 2017-03-27 (×2): 5 mg via ORAL
  Filled 2017-03-26 (×2): qty 1

## 2017-03-26 NOTE — Progress Notes (Signed)
Progress Note  Patient Name: Darrell Perez Date of Encounter: 03/26/2017  Primary Cardiologist: new - Dr. Percival Spanish  Subjective   Patient is feeling well; denies chest pain, SOB, and palpitations.  Inpatient Medications    Scheduled Meds: . allopurinol  100 mg Oral Daily  . aspirin EC  81 mg Oral Daily  . chlorhexidine  15 mL Mouth Rinse BID  . diltiazem  240 mg Oral Daily  . docusate sodium  100 mg Oral Daily  . enoxaparin (LOVENOX) injection  40 mg Subcutaneous Q24H  . loratadine  10 mg Oral Daily  . mouth rinse  15 mL Mouth Rinse q12n4p  . potassium chloride SA  20 mEq Oral BID  . potassium chloride  40 mEq Oral Once   Continuous Infusions: . ampicillin (OMNIPEN) IV Stopped (03/26/17 0524)   PRN Meds: acetaminophen **OR** acetaminophen, albuterol, HYDROcodone-acetaminophen, hyoscyamine, ondansetron **OR** ondansetron (ZOFRAN) IV   Vital Signs    Vitals:   03/25/17 2100 03/25/17 2106 03/25/17 2301 03/26/17 0409  BP: (!) 146/48  (!) 141/82 (!) 143/72  Pulse: 61  82 87  Resp: (!) 22  20 (!) 22  Temp:  (!) 100.5 F (38.1 C) 98.9 F (37.2 C) 97.6 F (36.4 C)  TempSrc:  Oral Oral Oral  SpO2: 94%  96% 96%  Weight:   214 lb 8.1 oz (97.3 kg) 213 lb 6.5 oz (96.8 kg)  Height:        Intake/Output Summary (Last 24 hours) at 03/26/17 1010 Last data filed at 03/26/17 0350  Gross per 24 hour  Intake              820 ml  Output              725 ml  Net               95 ml   Filed Weights   03/25/17 0422 03/25/17 2301 03/26/17 0409  Weight: 213 lb 3 oz (96.7 kg) 214 lb 8.1 oz (97.3 kg) 213 lb 6.5 oz (96.8 kg)     Physical Exam   General: Well developed, well nourished, male appearing in no acute distress. Head: Normocephalic, atraumatic.  Neck: Supple without bruits, no JVD Lungs:  Resp regular and unlabored, CTA. Heart: Irregular rhythm and regular rate, no murmur; no rub. Abdomen: Soft, non-tender, non-distended with normoactive bowel sounds. No  hepatomegaly. No rebound/guarding. No obvious abdominal masses. Extremities: No clubbing, cyanosis, no edema. Distal pedal pulses are 2+ bilaterally. Neuro: Alert and oriented X 3. Moves all extremities spontaneously. Psych: Normal affect.  Labs    Chemistry Recent Labs Lab 03/24/17 0601 03/24/17 2307 03/25/17 0915 03/26/17 0502  NA 141 139 138 138  K 3.7 3.5 3.2* 3.2*  CL 106 104 104 104  CO2 27 25 24 26   GLUCOSE 134* 92 125* 117*  BUN 25* 27* 24* 19  CREATININE 1.33* 1.33* 1.21 1.08  CALCIUM 8.6* 8.8* 8.1* 8.1*  PROT 6.0* 6.9  --  6.1*  ALBUMIN 3.2* 3.5  --  3.0*  AST 28 36  --  23  ALT 28 28  --  22  ALKPHOS 35* 45  --  36*  BILITOT 0.6 0.7  --  0.8  GFRNONAA 50* 50* 56* >60  GFRAA 58* 58* >60 >60  ANIONGAP 8 10 10 8      Hematology Recent Labs Lab 03/24/17 2307 03/25/17 0908 03/26/17 0502  WBC 21.4* 15.8* 12.6*  RBC 5.13 4.70 4.88  HGB 15.8  14.0 14.8  HCT 45.5 41.1 43.2  MCV 88.7 87.4 88.5  MCH 30.8 29.8 30.3  MCHC 34.7 34.1 34.3  RDW 14.5 14.7 14.5  PLT 228 207 205    Cardiac Enzymes Recent Labs Lab 03/23/17 1602 03/23/17 2018 03/25/17 0915 03/25/17 1429  TROPONINI 0.03* <0.03 0.22* 0.26*   No results for input(s): TROPIPOC in the last 168 hours.   BNPNo results for input(s): BNP, PROBNP in the last 168 hours.   DDimer No results for input(s): DDIMER in the last 168 hours.   Radiology    Dg Chest 2 View  Result Date: 03/24/2017 CLINICAL DATA:  Suspected sepsis. Pain fever and weakness. Recent ureteral stent placement. EXAM: CHEST  2 VIEW COMPARISON:  Chest radiographs 12/08/2012 FINDINGS: The cardiomediastinal contours are normal. Atherosclerosis of the aortic arch. Mild vascular congestion. Suspect small pleural effusions. No confluent airspace disease. No pneumothorax. Reverse right shoulder arthroplasty, partially included. Degenerative change in the left shoulder and throughout the spine. No acute osseous abnormalities are seen. IMPRESSION:  Vascular congestion.  Suspect small pleural effusions. Electronically Signed   By: Jeb Levering M.D.   On: 03/24/2017 23:37   Dg Abd Portable 1v  Result Date: 03/25/2017 CLINICAL DATA:  Renal stent.  Fever. EXAM: PORTABLE ABDOMEN - 1 VIEW COMPARISON:  CT 03/23/2017 FINDINGS: Left ureteral stent in place, proximal coil in the region the renal pelvis common distal coli L in the region of the bladder. Probable 7 mm stone adjacent to the stent at the level of sacrum. 17 mm stone in the lower left kidney again seen. Multiple right renal stones also seen. No stones in the region of the right ureter. There is a normal bowel gas pattern. No free air. Degenerative change in the spine. IMPRESSION: 1. Left ureteral stent in place. Probable stone adjacent to the distal stent at the level of the mid sacrum. 2. Bilateral nonobstructing nephrolithiasis. Electronically Signed   By: Jeb Levering M.D.   On: 03/25/2017 03:13     Telemetry    Sinus with frequent PVCs, PACs, possible flutter waves - Personally Reviewed  ECG    03/26/17 with Afib and post-conversion pause - Personally Reviewed   Cardiac Studies   Echocardiogram 03/25/17: Study Conclusions - Left ventricle: The cavity size was normal. Wall thickness was   increased in a pattern of mild LVH. Systolic function was normal.   The estimated ejection fraction was in the range of 55% to 60%.   Wall motion was normal; there were no regional wall motion   abnormalities. Left ventricular diastolic function parameters   were normal. - Left atrium: LA diameter 43 mm. The atrium was mildly dilated. - Atrial septum: No defect or patent foramen ovale was identified.  Patient Profile     76 y.o. male with a hx of HTN, OSA on CPAP, hx of kidney stones, GERD, and asthma who is being seen for the evaluation of new onset Afib RVR vs tachycardia.  Assessment & Plan    1.  Atrial flutter, HTN   - reviewed telemetry with attending yesterday and no  fib/flutter appreciated, D/C'ed heparin drip - today, telemetry review with sinus rhythm and frequent PVCs and PACs, some evidence of flutter waves - will again review with attending - D/C'ed pindolol yesterday - increase cardizem to 300 mg daily, ordered cardizem 30 mg q6hr x 3 doses to bridge higher dose - follow pressures, if he continues to be hypertensive, consider norvasc after titration rhythm/rate medications - TSH 0.439 (  upper limit of low, possible hyperthyroidism), needs follow up outpatient, T4 pending - Mg 1.8, will give 2 g considering frequent premature beats  - K 3.2, replace with PO per primary team - patient remains asymptomatic   2. AKI - sCr 1.08 - K 3.2, as above   Signed,  Ledora Bottcher , PA-C 10:10 AM 03/26/2017 Pager: [phone]  History and all data above reviewed.  Patient examined.  I agree with the findings as above.   Definite atrial flutter paroxysmal on tele.   He does not feel this.  Denies palpitations, presyncope or syncope.   The patient exam reveals COR:RRR with ectopy  ,  Lungs: clear  ,  Abd: Positive bowel sounds, no rebound no guarding, Ext No edema  .  All available labs, radiology testing, previous records reviewed. Agree with documented assessment and plan.   ATRIAL FLUTTER:  Darrell Perez has a CHA2DS2 - VASc score of 2.  His rate during these is controlled.  Start Eliquis as long as there is no contraindication with his current urologic problems.    Jeneen Rinks Delle Andrzejewski  1:46 PM  03/26/2017

## 2017-03-26 NOTE — Progress Notes (Signed)
Patient has had 2 episodes of bradycardia, rates 30-40 where it appears he is in 2nd degree HB.  Dr. Posey Pronto is aware; EKG was ordered and performed for patient this AM.  Patient was most likely asleep at this time and was asymptomatic.  Will continue to monitor.

## 2017-03-26 NOTE — Consult Note (Addendum)
   [facility] Inpatient Consult   03/26/2017  JERROLD HASKELL December 26, 1940 [id number]    Screened for [facility] Care Management program services due to hospital readmission.  Went to bedside to speak with Mr. Armenti and son.   Explained [facility] Management services. Mr. Schlafer endorses he could use the additional follow up post discharge due to new medication (blood thinner) and new afib.  [facility] Care Management written consent obtained and folder provided.   Confirmed Primary Care MD is Dr. Laurann Montana.  Lives with wife.  Obtains medications thru mail order and [facility] on [address] (when needed). Best contact number [phone].  Discussed referral for [facility] RNCM for transition of care. Explained that these services will not interfere or replace services provided by home health should he have it.   Mr. Guarisco appreciative of visit.   Will make inpatient RNCM aware.  Will make referral for [facility] RNCM for transition of care. Hospitalized for sepsis. New aflutter. History of HTN, GERD, sleep apnea, and pyelonephritis.   Marthenia Rolling, MSN-Ed, RN,BSN [facility] Liaison [phone]

## 2017-03-26 NOTE — Progress Notes (Signed)
Patient c/o constipation.  Dr. Posey Pronto paged; orders for miralax received.  Patient aware he may have miralax upon request.  Will continue to monitor.

## 2017-03-26 NOTE — Progress Notes (Signed)
Pharmacy Antibiotic Note  30 yoM admitted with sepsis secondary to left ureteral calculus with Enterococcus faecalis, pyelonephritis   Pharmacy has been consulted for ampicillin dosing. 03/26/2017 Chills last night, overall feeling better.  WBC trending down, Creat 1.21>>1.08.    Plan: Increase ampicillin to 2 gm IV q6h  Height: 5\' 9"  (175.3 cm) Weight: 213 lb 6.5 oz (96.8 kg) IBW/kg (Calculated) : 70.7  Temp (24hrs), Avg:99.4 F (37.4 C), Min:97.6 F (36.4 C), Max:101.9 F (38.8 C)   Recent Labs Lab 03/23/17 0157 03/24/17 0601 03/24/17 2307 03/24/17 2319 03/25/17 0908 03/25/17 0915 03/26/17 0502  WBC 9.9 18.7* 21.4*  --  15.8*  --  12.6*  CREATININE 1.25* 1.33* 1.33*  --   --  1.21 1.08  LATICACIDVEN  --   --   --  1.04  --   --   --     Estimated Creatinine Clearance: 66.7 mL/min (by C-G formula based on SCr of 1.08 mg/dL).    Allergies  Allergen Reactions  . Naprosyn [Naproxen] Other (See Comments)    "makes me jittery/nervous"  Antimicrobials this admission:  9/25 Ampicillin >> 9/24 CTX>>9/25  Dose adjustments this admission:  9/27 incr from 1 gm q6 to 2 gm q6h for improved renal fxn Microbiology results:  9/24 UCx: > 100 K Enterococcus faecalis (pan-sensitive) 9/26 BCx:  9/26 MRSA screen neg  Thank you for allowing pharmacy to be a part of this patient's care. Eudelia Bunch, Pharm.D. [phone] 03/26/2017 1:13 PM

## 2017-03-26 NOTE — Progress Notes (Signed)
Subjective: Patient reports chills last night. Feeling better today overall. Urgency last night and had trouble voiding, but had a good stream once he stood up.   Objective: Vital signs in last 24 hours: Temp:  [97.6 F (36.4 C)-101.9 F (38.8 C)] 97.6 F (36.4 C) (09/27 0409) Pulse Rate:  [57-87] 87 (09/27 0409) Resp:  [19-24] 22 (09/27 0409) BP: (130-163)/(45-82) 143/72 (09/27 0409) SpO2:  [94 %-98 %] 96 % (09/27 0409) Weight:  [96.8 kg (213 lb 6.5 oz)-97.3 kg (214 lb 8.1 oz)] 96.8 kg (213 lb 6.5 oz) (09/27 0409)  Intake/Output from previous day: 09/26 0701 - 09/27 0700 In: 1060 [P.O.:960; IV Piggyback:100] Out: 925 [Urine:925] Intake/Output this shift: No intake/output data recorded.  Physical Exam:  NAD A&Ox3 Brushing his teeth Abd - soft, NT  Lab Results:  Recent Labs  03/24/17 2307 03/25/17 0908 03/26/17 0502  HGB 15.8 14.0 14.8  HCT 45.5 41.1 43.2   BMET  Recent Labs  03/25/17 0915 03/26/17 0502  NA 138 138  K 3.2* 3.2*  CL 104 104  CO2 24 26  GLUCOSE 125* 117*  BUN 24* 19  CREATININE 1.21 1.08  CALCIUM 8.1* 8.1*    Recent Labs  03/24/17 2307 03/26/17 0502  INR 0.98 1.06   No results for input(s): LABURIN in the last 72 hours. Results for orders placed or performed during the hospital encounter of 03/24/17  MRSA PCR Screening     Status: None   Collection Time: 03/25/17  4:15 AM  Result Value Ref Range Status   MRSA by PCR NEGATIVE NEGATIVE Final    Comment:        The GeneXpert MRSA Assay (FDA approved for NASAL specimens only), is one component of a comprehensive MRSA colonization surveillance program. It is not intended to diagnose MRSA infection nor to guide or monitor treatment for MRSA infections.     Studies/Results: Dg Chest 2 View  Result Date: 03/24/2017 CLINICAL DATA:  Suspected sepsis. Pain fever and weakness. Recent ureteral stent placement. EXAM: CHEST  2 VIEW COMPARISON:  Chest radiographs 12/08/2012 FINDINGS:  The cardiomediastinal contours are normal. Atherosclerosis of the aortic arch. Mild vascular congestion. Suspect small pleural effusions. No confluent airspace disease. No pneumothorax. Reverse right shoulder arthroplasty, partially included. Degenerative change in the left shoulder and throughout the spine. No acute osseous abnormalities are seen. IMPRESSION: Vascular congestion.  Suspect small pleural effusions. Electronically Signed   By: Jeb Levering M.D.   On: 03/24/2017 23:37   Dg Abd Portable 1v  Result Date: 03/25/2017 CLINICAL DATA:  Renal stent.  Fever. EXAM: PORTABLE ABDOMEN - 1 VIEW COMPARISON:  CT 03/23/2017 FINDINGS: Left ureteral stent in place, proximal coil in the region the renal pelvis common distal coli L in the region of the bladder. Probable 7 mm stone adjacent to the stent at the level of sacrum. 17 mm stone in the lower left kidney again seen. Multiple right renal stones also seen. No stones in the region of the right ureter. There is a normal bowel gas pattern. No free air. Degenerative change in the spine. IMPRESSION: 1. Left ureteral stent in place. Probable stone adjacent to the distal stent at the level of the mid sacrum. 2. Bilateral nonobstructing nephrolithiasis. Electronically Signed   By: Jeb Levering M.D.   On: 03/25/2017 03:13    Assessment/Plan: UTI - enterococcus - on IV ampicillin. WBC and Cr continue to improve. Spiking fevers c/w pyelonephritis.   Left ureteral and renal stones - I will set  up for outpt ureteroscopy in 2-3 weeks.    LOS: 1 day   Florina Glas 03/26/2017, 9:09 AM

## 2017-03-26 NOTE — Progress Notes (Signed)
[facility] Progress Note  Patient: Darrell Perez NWG:[id number]   PCP: Lavone Orn, MD DOB: [birth year]   DOA: 03/24/2017   DOS: 03/26/2017   Date of Service: the patient was seen and examined on 03/26/2017  Subjective: feeling better, still had some fever no chills, no abdominal pain,   Brief hospital course: Pt. with PMH of of nephrolithiasis, HTN, GERD, sleep apnea; admitted on 03/24/2017, presented with complaint of fever, was found to have UTI. Currently further plan is continue Antibiotics and monitor blood culture.  Assessment and Plan: Sepsis secondary to left ureteral calculus with enterococcus faecalis, pyelonephritis:  Acute. Patient recently discharged home on antibiotics of ciprofloxacin, but cultures came back positive for Enterococcus faecalis.  Sepsis protocol was initiated as patient presented with fever up to 100.9 and tachycardia with elevated WBC to 21.4. pansensitive organism, continue ampicillin  No procedure planned at now, per urology. Will need outpatient ureteroscopy  In 2-3 weeks.  Blood culture pending. Monitor   Atrial fibrillation: Acute.  He reports no previous history of having irregular heartbeat. CHADsVASc score equals at least 3. Cardiology recommendation appreciated.  Continue cardizem, added apixaban. Monitor.   Essential hypertension  - Hold pindolol, changed to cardizem   Renal insufficiency: Patient presents with BUN 27 and creatinine 1.33. Stable, monitor.  Gout  - Continue allopurinol   Diet: regular diet DVT Prophylaxis: on therapeutic anticoagulation.  Advance goals of care discussion: full code  Family Communication: family was present at bedside, at the time of interview. The pt provided permission to discuss medical plan with the family. Opportunity was given to ask question and all questions were answered satisfactorily.   Disposition:  Discharge to home.  Consultants: urology, cardiology  Procedures: Echocardiogram    Antibiotics: Anti-infectives    Start     Dose/Rate Route Frequency Ordered Stop   03/26/17 1330  ampicillin (OMNIPEN) 2 g in sodium chloride 0.9 % 50 mL IVPB     2 g 150 mL/hr over 20 Minutes Intravenous Every 6 hours 03/26/17 1311     03/25/17 0700  ampicillin (OMNIPEN) 1 g in sodium chloride 0.9 % 50 mL IVPB  Status:  Discontinued     1 g 150 mL/hr over 20 Minutes Intravenous Every 6 hours 03/25/17 0652 03/26/17 1311   03/25/17 0045  ampicillin (OMNIPEN) 1 g in sodium chloride 0.9 % 50 mL IVPB     1 g 150 mL/hr over 20 Minutes Intravenous  Once 03/25/17 0041 03/25/17 0130       Objective: Physical Exam: Vitals:   03/25/17 2106 03/25/17 2301 03/26/17 0409 03/26/17 1412  BP:  (!) 141/82 (!) 143/72 (!) 156/59  Pulse:  82 87 78  Resp:  20 (!) 22 20  Temp: (!) 100.5 F (38.1 C) 98.9 F (37.2 C) 97.6 F (36.4 C) 100.1 F (37.8 C)  TempSrc: Oral Oral Oral Oral  SpO2:  96% 96% 96%  Weight:  97.3 kg (214 lb 8.1 oz) 96.8 kg (213 lb 6.5 oz)   Height:        Intake/Output Summary (Last 24 hours) at 03/26/17 1600 Last data filed at 03/26/17 1412  Gross per 24 hour  Intake              940 ml  Output              700 ml  Net              240 ml   Filed Weights   03/25/17  0422 03/25/17 2301 03/26/17 0409  Weight: 96.7 kg (213 lb 3 oz) 97.3 kg (214 lb 8.1 oz) 96.8 kg (213 lb 6.5 oz)   General: Alert, Awake and Oriented to Time, Place and Person. Appear in mild distress, affect appropriate Eyes: PERRL, Conjunctiva normal ENT: Oral Mucosa clear moist. Neck: no JVD, no Abnormal Mass Or lumps Cardiovascular: S1 and S2 Present, no Murmur, Peripheral Pulses Present Respiratory: normal respiratory effort, Bilateral Air entry equal and Decreased, no use of accessory muscle, Clear to Auscultation, no Crackles, no wheezes Abdomen: Bowel Sound present, Soft and no tenderness, no hernia Skin: no redness, no Rash, no induration Extremities: no Pedal edema, no calf  tenderness Neurologic: Grossly no focal neuro deficit. Bilaterally Equal motor strength  Data Reviewed: CBC:  Recent Labs Lab 03/23/17 0157 03/24/17 0601 03/24/17 2307 03/25/17 0908 03/26/17 0502  WBC 9.9 18.7* 21.4* 15.8* 12.6*  NEUTROABS 7.9*  --  18.2*  --  9.8*  HGB 15.9 14.7 15.8 14.0 14.8  HCT 45.5 43.0 45.5 41.1 43.2  MCV 87.3 88.7 88.7 87.4 88.5  PLT 260 218 228 207 941   Basic Metabolic Panel:  Recent Labs Lab 03/23/17 0157 03/23/17 0928 03/24/17 0601 03/24/17 2307 03/25/17 0908 03/25/17 0915 03/26/17 0502  NA 138  --  141 139  --  138 138  K 3.3*  --  3.7 3.5  --  3.2* 3.2*  CL 105  --  106 104  --  104 104  CO2 25  --  27 25  --  24 26  GLUCOSE 146*  --  134* 92  --  125* 117*  BUN 23*  --  25* 27*  --  24* 19  CREATININE 1.25*  --  1.33* 1.33*  --  1.21 1.08  CALCIUM 9.2  --  8.6* 8.8*  --  8.1* 8.1*  MG  --  1.7  --   --  1.8  --  1.7    Liver Function Tests:  Recent Labs Lab 03/24/17 0601 03/24/17 2307 03/26/17 0502  AST 28 36 23  ALT 28 28 22   ALKPHOS 35* 45 36*  BILITOT 0.6 0.7 0.8  PROT 6.0* 6.9 6.1*  ALBUMIN 3.2* 3.5 3.0*   No results for input(s): LIPASE, AMYLASE in the last 168 hours. No results for input(s): AMMONIA in the last 168 hours. Coagulation Profile:  Recent Labs Lab 03/24/17 2307 03/26/17 0502  INR 0.98 1.06   Cardiac Enzymes:  Recent Labs Lab 03/23/17 0928 03/23/17 1602 03/23/17 2018 03/25/17 0915 03/25/17 1429  TROPONINI <0.03 0.03* <0.03 0.22* 0.26*   BNP (last 3 results) No results for input(s): PROBNP in the last 8760 hours. CBG: No results for input(s): GLUCAP in the last 168 hours. Studies: No results found.  Scheduled Meds: . allopurinol  100 mg Oral Daily  . apixaban  5 mg Oral BID  . chlorhexidine  15 mL Mouth Rinse BID  . [START ON 03/27/2017] diltiazem  300 mg Oral Daily  . diltiazem  60 mg Oral Q6H  . docusate sodium  100 mg Oral Daily  . loratadine  10 mg Oral Daily  . mouth rinse   15 mL Mouth Rinse q12n4p  . potassium chloride SA  20 mEq Oral BID   Continuous Infusions: . ampicillin (OMNIPEN) IV 2 g (03/26/17 1529)   PRN Meds: acetaminophen **OR** acetaminophen, albuterol, HYDROcodone-acetaminophen, hyoscyamine, ondansetron **OR** ondansetron (ZOFRAN) IV  Time spent: 35 minutes  Author: Berle Mull, MD [facility] Pager: [phone] 03/26/2017  4:00 PM  If 7PM-7AM, please contact night-coverage at [email or web address], password [email or web address]

## 2017-03-27 DIAGNOSIS — I480 Paroxysmal atrial fibrillation: Secondary | ICD-10-CM

## 2017-03-27 LAB — CBC
HCT: 41.1 % (ref 39.0–52.0)
HEMOGLOBIN: 13.9 g/dL (ref 13.0–17.0)
MCH: 30 pg (ref 26.0–34.0)
MCHC: 33.8 g/dL (ref 30.0–36.0)
MCV: 88.8 fL (ref 78.0–100.0)
Platelets: 190 10*3/uL (ref 150–400)
RBC: 4.63 MIL/uL (ref 4.22–5.81)
RDW: 14.7 % (ref 11.5–15.5)
WBC: 9.8 10*3/uL (ref 4.0–10.5)

## 2017-03-27 LAB — BASIC METABOLIC PANEL
Anion gap: 8 (ref 5–15)
BUN: 22 mg/dL — AB (ref 6–20)
CHLORIDE: 106 mmol/L (ref 101–111)
CO2: 24 mmol/L (ref 22–32)
CREATININE: 1.02 mg/dL (ref 0.61–1.24)
Calcium: 7.9 mg/dL — ABNORMAL LOW (ref 8.9–10.3)
GFR calc Af Amer: >60 mL/min (ref >60)
Glucose, Bld: 115 mg/dL — ABNORMAL HIGH (ref 65–99)
Potassium: 3.7 mmol/L (ref 3.5–5.1)
SODIUM: 138 mmol/L (ref 135–145)

## 2017-03-27 MED ORDER — AMOXICILLIN 500 MG PO CAPS: 500.0000 mg | ORAL_CAPSULE | Freq: Three times a day (TID) | ORAL | 0 refills | Status: AC

## 2017-03-27 MED ORDER — APIXABAN 5 MG PO TABS: 5.0000 mg | ORAL_TABLET | Freq: Two times a day (BID) | ORAL | 0 refills | Status: DC

## 2017-03-27 MED ORDER — POLYETHYLENE GLYCOL 3350 17 G PO PACK: 17.0000 g | PACK | Freq: Every day | ORAL | 0 refills | Status: AC

## 2017-03-27 MED ORDER — DILTIAZEM HCL ER COATED BEADS 300 MG PO CP24: 300.0000 mg | ORAL_CAPSULE | Freq: Every day | ORAL | 0 refills | Status: DC

## 2017-03-27 MED ORDER — AMOXICILLIN 250 MG PO CAPS: 500.0000 mg | ORAL_CAPSULE | Freq: Three times a day (TID) | ORAL | Status: DC

## 2017-03-27 MED ORDER — SACCHAROMYCES BOULARDII 250 MG PO CAPS: 250.0000 mg | ORAL_CAPSULE | Freq: Two times a day (BID) | ORAL | 0 refills | Status: DC

## 2017-03-27 MED ORDER — SACCHAROMYCES BOULARDII 250 MG PO CAPS: 250.0000 mg | ORAL_CAPSULE | Freq: Two times a day (BID) | ORAL | Status: DC

## 2017-03-27 NOTE — Progress Notes (Signed)
Progress Note  Patient Name: Darrell Perez Date of Encounter: 03/27/2017  Primary Cardiologist:   Dr. Percival Spanish  Subjective   No chest pain.  No SOB  Inpatient Medications    Scheduled Meds: . allopurinol  100 mg Oral Daily  . amoxicillin  500 mg Oral Q8H  . apixaban  5 mg Oral BID  . chlorhexidine  15 mL Mouth Rinse BID  . diltiazem  300 mg Oral Daily  . docusate sodium  100 mg Oral Daily  . loratadine  10 mg Oral Daily  . mouth rinse  15 mL Mouth Rinse q12n4p  . polyethylene glycol  17 g Oral Daily  . potassium chloride SA  20 mEq Oral BID   Continuous Infusions:  PRN Meds: acetaminophen **OR** acetaminophen, albuterol, HYDROcodone-acetaminophen, hyoscyamine, ondansetron **OR** ondansetron (ZOFRAN) IV   Vital Signs    Vitals:   03/26/17 0409 03/26/17 1412 03/26/17 2114 03/27/17 0507  BP: (!) 143/72 (!) 156/59 136/63 (!) 131/50  Pulse: 87 78 63 (!) 58  Resp: (!) 22 20 20 20   Temp: 97.6 F (36.4 C) 100.1 F (37.8 C) 98.3 F (36.8 C) 98 F (36.7 C)  TempSrc: Oral Oral Oral Oral  SpO2: 96% 96% 95% 96%  Weight: 213 lb 6.5 oz (96.8 kg)   213 lb 13.5 oz (97 kg)  Height:        Intake/Output Summary (Last 24 hours) at 03/27/17 1044 Last data filed at 03/27/17 0845  Gross per 24 hour  Intake              800 ml  Output             1000 ml  Net             -200 ml   Filed Weights   03/25/17 2301 03/26/17 0409 03/27/17 0507  Weight: 214 lb 8.1 oz (97.3 kg) 213 lb 6.5 oz (96.8 kg) 213 lb 13.5 oz (97 kg)    Telemetry    NSR, sinus brady - Personally Reviewed  ECG    NA - Personally Reviewed  Physical Exam   GEN: No acute distress.   Neck: No  JVD Cardiac: RRR, no murmurs, rubs, or gallops.  Respiratory: Clear  to auscultation bilaterally. GI: Soft, nontender, non-distended  MS: No  edema; No deformity. Neuro:  Nonfocal  Psych: Normal affect   Labs    Chemistry Recent Labs Lab 03/24/17 0601 03/24/17 2307 03/25/17 0915 03/26/17 0502  03/27/17 0422  NA 141 139 138 138 138  K 3.7 3.5 3.2* 3.2* 3.7  CL 106 104 104 104 106  CO2 27 25 24 26 24   GLUCOSE 134* 92 125* 117* 115*  BUN 25* 27* 24* 19 22*  CREATININE 1.33* 1.33* 1.21 1.08 1.02  CALCIUM 8.6* 8.8* 8.1* 8.1* 7.9*  PROT 6.0* 6.9  --  6.1*  --   ALBUMIN 3.2* 3.5  --  3.0*  --   AST 28 36  --  23  --   ALT 28 28  --  22  --   ALKPHOS 35* 45  --  36*  --   BILITOT 0.6 0.7  --  0.8  --   GFRNONAA 50* 50* 56* >60 >60  GFRAA 58* 58* >60 >60 >60  ANIONGAP 8 10 10 8 8      Hematology Recent Labs Lab 03/25/17 0908 03/26/17 0502 03/27/17 0422  WBC 15.8* 12.6* 9.8  RBC 4.70 4.88 4.63  HGB 14.0 14.8 13.9  HCT 41.1 43.2 41.1  MCV 87.4 88.5 88.8  MCH 29.8 30.3 30.0  MCHC 34.1 34.3 33.8  RDW 14.7 14.5 14.7  PLT 207 205 190    Cardiac Enzymes Recent Labs Lab 03/23/17 1602 03/23/17 2018 03/25/17 0915 03/25/17 1429  TROPONINI 0.03* <0.03 0.22* 0.26*   No results for input(s): TROPIPOC in the last 168 hours.   BNPNo results for input(s): BNP, PROBNP in the last 168 hours.   DDimer No results for input(s): DDIMER in the last 168 hours.   Radiology    No results found.  Cardiac Studies   Echocardiogram 03/25/17: Study Conclusions - Left ventricle: The cavity size was normal. Wall thickness was increased in a pattern of mild LVH. Systolic function was normal. The estimated ejection fraction was in the range of 55% to 60%. Wall motion was normal; there were no regional wall motion abnormalities. Left ventricular diastolic function parameters were normal. - Left atrium: LA diameter 43 mm. The atrium was mildly dilated. - Atrial septum: No defect or patent foramen ovale was identified.  Patient Profile     76 y.o. male with a hx of HTN, OSA on CPAP, hx of kidney stones, GERD, and asthmawho is being seen for the evaluation of new onset Afib RVR vs tachycardia.  Assessment & Plan    ATRIAL FIB/FLUTTER:  Started on Eliquis.  Also on  Cardizem 300 mg daily.  This appears to be the correct dose based on tele but we will need to see him in the hospital and follow up with Holter.    He was given instructions about holding Eliquis before his urologic procedure and to stop if he has recurrent hematuria.    Signed, Minus Breeding, MD  03/27/2017, 10:44 AM

## 2017-03-30 ENCOUNTER — Other Ambulatory Visit: Payer: Self-pay

## 2017-03-30 LAB — CULTURE, BLOOD (ROUTINE X 2)
CULTURE: NO GROWTH
CULTURE: NO GROWTH
SPECIAL REQUESTS: ADEQUATE
Special Requests: ADEQUATE

## 2017-03-30 NOTE — Patient Outreach (Signed)
Transition of care: Discharge date of 03/27/2017  Placed call to patient and spoke with wife.   Wife states that patient is very little energy and feels weak. Reports that he is eating well.   Denies any fever since hospital discharge.  Reports patient has follow up with Dr. Laurann Montana on 10/4. Wife reports that patient has all medications and denies any problems with medications at this time.    Plan: Reviewed transition of care program and offered home visit.  Wife accepted home visit for 04/01/2017.  Confirmed address and provided my contact information.  Barrier letter sent to MD.  Full assessment in 48 hours.  Tomasa Rand, RN, BSN, CEN [facility] [employer] [phone]

## 2017-04-01 ENCOUNTER — Other Ambulatory Visit: Payer: Self-pay

## 2017-04-01 NOTE — Patient Outreach (Signed)
[facility]) Care Management   04/01/2017  Darrell Perez 09-16-1940 [id number]  Darrell Perez is an 76 y.o. male  Subjective: Reports that he had a kidney stone and then developed fast heart rate.  Patient reports that he has a stent. Denies hematuria in the last 2 days.  Reports that he continues to have a decreased energy level.  Patient and wife report they understand patients medications.   Objective:  Awake and alert. Ambulating slowly without any assistive device.  Vitals:   04/01/17 1536  BP: 138/62  Pulse: 67  Resp: 18  SpO2: 97%  Weight: 210 lb (95.3 kg)  Height: 1.753 m (5\' 9" )   Review of Systems  HENT: Negative.   Eyes:       Wears glasses  Respiratory: Negative.   Cardiovascular: Negative.  Orthopnea: denies chest pain or palpations since coming home from hospital.  Gastrointestinal: Positive for diarrhea.  Genitourinary: Positive for dysuria and frequency.       Denies hematuria in the last 2 days  Musculoskeletal: Positive for back pain.  Skin: Negative.   Neurological: Positive for weakness.  Endo/Heme/Allergies: Bruises/bleeds easily.  Psychiatric/Behavioral: Negative.        Difficulty sleeping due to urinary freqency    Physical Exam  Constitutional: He is oriented to person, place, and time. He appears well-developed and well-nourished.  Cardiovascular: Normal rate, regular rhythm, normal heart sounds and intact distal pulses.   Apical pulse regular, radial pulse regular  Respiratory: Effort normal and breath sounds normal.  GI: Soft. Bowel sounds are normal.  Musculoskeletal: Normal range of motion. He exhibits no edema.  Neurological: He is alert and oriented to person, place, and time.  Skin: Skin is warm and dry.  Feet without open sores or lesions  Psychiatric: He has a normal mood and affect. His behavior is normal. Judgment and thought content normal.    Encounter Medications:   Outpatient Encounter Prescriptions as of  04/01/2017  Medication Sig  . acetaminophen (TYLENOL) 500 MG tablet Take by mouth every 6 (six) hours as needed for mild pain, moderate pain, fever or headache.   . allopurinol (ZYLOPRIM) 100 MG tablet Take 100 mg by mouth daily.   [name] apixaban (ELIQUIS) 5 MG TABS tablet Take 1 tablet (5 mg total) by mouth 2 (two) times daily.  . cholecalciferol (VITAMIN D) 1000 UNITS tablet Take 1,000 Units by mouth daily.  [name] diltiazem (CARDIZEM CD) 300 MG 24 hr capsule Take 1 capsule (300 mg total) by mouth daily.  [name] HYDROcodone-acetaminophen (NORCO/VICODIN) 5-325 MG tablet Take 1 tablet by mouth every 6 (six) hours as needed for moderate pain.  . hyoscyamine (ANASPAZ) 0.125 MG TBDP disintergrating tablet Place 0.125 mg under the tongue every 6 (six) hours as needed for cramping.  . indapamide (LOZOL) 2.5 MG tablet Take 2.5 mg by mouth every evening.   . loperamide (IMODIUM) 2 MG capsule Take 1-2 mg by mouth as needed.  . loratadine (CLARITIN) 10 MG tablet Take 10 mg by mouth daily.  . potassium chloride SA (K-DUR,KLOR-CON) 20 MEQ tablet Take 20 mEq by mouth 2 (two) times daily.  [name] saccharomyces boulardii (FLORASTOR) 250 MG capsule Take 1 capsule (250 mg total) by mouth 2 (two) times daily.  . vitamin B-12 (CYANOCOBALAMIN) 1000 MCG tablet Take 1,000 mcg by mouth daily.  . polyethylene glycol (MIRALAX / GLYCOLAX) packet Take 17 g by mouth daily. (Patient not taking: Reported on 04/01/2017)   No facility-administered encounter medications on file  as of 04/01/2017.     Functional Status:   In your present state of health, do you have any difficulty performing the following activities: 04/01/2017 03/25/2017  Hearing? N Y  Comment wears hearing aid to church to MD office WEARS BILATERAL HEARING AIDS  Vision? N N  Difficulty concentrating or making decisions? Y N  Walking or climbing stairs? N N  Dressing or bathing? N N  Doing errands, shopping? N N  Preparing Food and eating ? N -  Using the Toilet? Y -  In the  past six months, have you accidently leaked urine? N -  Do you have problems with loss of bowel control? Y -  Managing your Medications? N -  Managing your Finances? N -  Housekeeping or managing your Housekeeping? N -  Some recent data might be hidden    Fall/Depression Screening:    No flowsheet data found. PHQ 2/9 Scores 04/01/2017  PHQ - 2 Score 1    Assessment:   (1) reviewed [facility] program.  Reviewed consent in electronic medical record and patient denies any changes needed. Patient has new patient packet from hospital. Reviewed call a nurse line.  (2) no advanced directives (3) follow up planned with MD on 04/02/2017. (4) understands medications.  Uses a pill planner (5) reports decreased energy. (6) new Afib diagnosis.  Pulse regular during home visit (7) planned removal of kidney stone on 04/10/2017  Plan:  (1) confirmed consent in chart. (2) provided advanced directive packet and reviewed how to complete. (3) encouraged patient to ask all questions has had at MD appointment.  (4) encouraged patient to notify this case manager or MD for problems with medications.  (5) reviewed importance of being active and not staying in the bed. Encouraged range of motion exercises while watching TV. (6) reviewed signs of heart attach and A fib zones and action plan. (7) reviewed importance of observing for hematuria, fever, flank pain or body ache. Encouraged patient to call urologist and report any changes in condition.  Goal setting and care planning during home visit and primary goal is to avoid a readmission.  This note sent to MD. Next follow up in 1 week via phone.  [facility] CM Care Plan Problem One     Most Recent Value  Care Plan Problem One  Recent admission for sepsis  Role Documenting the Problem One  Care Management [place] for Problem One  Active  [facility] Long Term Goal   Patient will report no readmissions in the next 60 days.   [facility] Long Term Goal Start Date   03/30/17  Interventions for Problem One Long Term Goal  home visit completed. reviewed importance of follow up with MD.  Reviewed signs of infection and when to call MD.   [facility] CM Short Term Goal #1   Patient will report attending scheduled primary MD visit in the next 10 days.   [facility] CM Short Term Goal #1 Start Date  03/30/17  Interventions for Short Term Goal #1  Encouraged wife to take medications with her to MD appointment. Provided my contact information. explained transition of care program.  [facility] CM Short Term Goal #2   Patient will report improved energy level in the next 2 weeks.   [facility] CM Short Term Goal #2 Start Date  03/30/17  Interventions for Short Term Goal #2  Encouraged patient to be active. Reviewed fall precautions.   [facility] CM Short Term Goal #3  Patient will report no  rapid heart beats in the next 30 days.   [facility] CM Short Term Goal #3 Start Date  03/30/17  Interventions for Short Tern Goal #3  Demostrated how to check a pulse with patient and wife. Reviewed Afib zones and action plan. Encouraged patient to take his medications as prescribed.      Tomasa Rand, RN, BSN, CEN [facility] [employer] [phone]

## 2017-04-01 NOTE — Discharge Summary (Signed)
[facility] Discharge Summary   Patient: Darrell Perez VXB:[id number]   PCP: Lavone Orn, MD DOB: [birth year]   Date of admission: 03/24/2017   Date of discharge: 03/27/2017     Discharge Diagnoses:  Principal Problem:   Sepsis [facility]) Active Problems:   Essential hypertension, benign   Gout, unspecified   Left ureteral calculus   Pyelonephritis   Admitted From: home Disposition:  home  Recommendations for Outpatient Follow-up:  1. Please follow up with PCP and urology    Follow-up Information    Lavone Orn, MD. Schedule an appointment as soon as possible for a visit in 1 week(s).   Specialty:  Internal Medicine Contact information: [address] [place] [address] [phone]        Festus Aloe, MD. Schedule an appointment as soon as possible for a visit in 1 month(s).   Specialty:  Urology Contact information: [place] [address] [phone]          Diet recommendation: regular diet  Activity: The patient is advised to gradually reintroduce usual activities.  Discharge Condition: fair  Code Status: full cde  History of present illness: As per the H and P dictated on admission, "Darrell Perez is a 76 y.o. male with medical history significant of nephrolithiasis, HTN, GERD, sleep apnea; who presented with complaints of fever and worsening pain. They should notes that initially symptoms started a p.m. on 9/23. Reported associated symptoms of nausea and vomiting. He presented to the emergency department and a CT study was obtained showing a 7 mm obstructing stone on the left. Patient underwent stenting by Dr. Link Snuffer of urology on 9/24. Patient was placed on ciprofloxacin and was thought to be medically stable to be discharged home. To follow-up with Dr. Gloriann Loan or Dr. Laurann Montana in the outpatient setting. However, once patient got home he reported worsening pain on the left lower back that radiated into the left side  and groin. Pain was noted as severe and seemed to worsen with trying to urinate. Subsequently patient developed chills and was noted to have fever of 101.72F. His wife called the urologist and they advised him to come to emergency department for further evaluation."  Hospital Course:  Summary of his active problems in the hospital is as following. Sepsis secondary to left ureteral calculus with enterococcus faecalis, pyelonephritis:  Acute. Patient recently discharged home on antibiotics of ciprofloxacin,but cultures came back positive for Enterococcus faecalis.  Sepsis protocol was initiated as patient presented with fever up to 100.9 and tachycardia with elevated WBC to 21.4. pansensitive organism, treated with ampicillin  No procedure planned at now, per urology. Will need outpatient ureteroscopy  In 2-3 weeks.  Blood culture negative, will discharge on amoxicillin  Atrial fibrillation: Acute.  He reports no previous history of having irregular heartbeat. CHADsVAScscore equals at least 3. Cardiology recommendation appreciated.  Continue cardizem, added apixaban. Monitor.   Essential hypertension  - Hold pindolol, changed to cardizem   Renal insufficiency: Patient presents with BUN 27 and creatinine1.33. Stable, monitor.  All other chronic medical condition were stable during the hospitalization.  Patient was ambulatory without any assistance. On the day of the discharge the patient's vitals were stable, and no other acute medical condition were reported by patient. the patient was felt safe to be discharge at home with family.  Procedures and Results:  Echocardiogram  Consultations:  Urology  Cardiology  DISCHARGE MEDICATION: Discharge Medication List as of 03/27/2017 11:56 AM  START taking these medications   Details  amoxicillin (AMOXIL) 500 MG capsule Take 1 capsule (500 mg total) by mouth 3 (three) times daily., Starting Fri 03/27/2017, Until Mon 03/30/2017,  Normal    apixaban (ELIQUIS) 5 MG TABS tablet Take 1 tablet (5 mg total) by mouth 2 (two) times daily., Starting Fri 03/27/2017, Normal    diltiazem (CARDIZEM CD) 300 MG 24 hr capsule Take 1 capsule (300 mg total) by mouth daily., Starting Sat 03/28/2017, Normal    polyethylene glycol (MIRALAX / GLYCOLAX) packet Take 17 g by mouth daily., Starting Fri 03/27/2017, Normal    saccharomyces boulardii (FLORASTOR) 250 MG capsule Take 1 capsule (250 mg total) by mouth 2 (two) times daily., Starting Fri 03/27/2017, Normal      CONTINUE these medications which have NOT CHANGED   Details  acetaminophen (TYLENOL) 500 MG tablet Take by mouth every 6 (six) hours as needed for mild pain, moderate pain, fever or headache. , Historical Med    allopurinol (ZYLOPRIM) 100 MG tablet Take 100 mg by mouth daily. , Historical Med    cholecalciferol (VITAMIN D) 1000 UNITS tablet Take 1,000 Units by mouth daily., Until Discontinued, Historical Med    HYDROcodone-acetaminophen (NORCO/VICODIN) 5-325 MG tablet Take 1 tablet by mouth every 6 (six) hours as needed for moderate pain., Historical Med    hyoscyamine (ANASPAZ) 0.125 MG TBDP disintergrating tablet Place 0.125 mg under the tongue every 6 (six) hours as needed for cramping., Historical Med    indapamide (LOZOL) 2.5 MG tablet Take 2.5 mg by mouth every evening. , Until Discontinued, Historical Med    loratadine (CLARITIN) 10 MG tablet Take 10 mg by mouth daily., Historical Med    potassium chloride SA (K-DUR,KLOR-CON) 20 MEQ tablet Take 20 mEq by mouth 2 (two) times daily., Until Discontinued, Historical Med    vitamin B-12 (CYANOCOBALAMIN) 1000 MCG tablet Take 1,000 mcg by mouth daily., Historical Med      STOP taking these medications     aspirin EC 81 MG tablet      ciprofloxacin (CIPRO) 500 MG tablet      loperamide (IMODIUM A-D) 2 MG tablet      pindolol (VISKEN) 5 MG tablet        Allergies  Allergen Reactions  . Naprosyn [Naproxen] Other  (See Comments)    "makes me jittery/nervous"   Discharge Instructions    AMB Referral to [facility]    Complete by:  As directed    Please assign to [place] for transition of care. Recent readmit. New medication changes and new aflutter. Written consent obtained. Currently at [facility].Please call with questions.Marthenia Rolling, [place], [facility] [phone]   Reason for consult:  Please assign to [place]   Expected date of contact:  1-3 days (reserved for hospital discharges)   Diet - low sodium heart healthy    Complete by:  As directed    Discharge instructions    Complete by:  As directed    It is important that you read following instructions as well as go over your medication list with RN to help you understand your care after this hospitalization.  Discharge Instructions: Please follow-up with PCP in one week  Please request your primary care physician to go over all Hospital Tests and Procedure/Radiological results at the follow up,  Please get all Hospital records sent to your PCP by signing hospital release before you go home.   Do not drive, operating  heavy machinery, perform activities at heights, swimming or participation in water activities or provide baby sitting services; until you have been seen by Primary Care Physician or a Neurologist and advised to do so again. Do not take more than prescribed Pain, Sleep and Anxiety Medications. You were cared for by a hospitalist during your hospital stay. If you have any questions about your discharge medications or the care you received while you were in the hospital after you are discharged, you can call the unit and ask to speak with the hospitalist on call if the hospitalist that took care of you is not available.  Once you are discharged, your primary care physician will handle any further medical issues. Please note that NO REFILLS for any discharge medications will  be authorized once you are discharged, as it is imperative that you return to your primary care physician (or establish a relationship with a primary care physician if you do not have one) for your aftercare needs so that they can reassess your need for medications and monitor your lab values. You Must read complete instructions/literature along with all the possible adverse reactions/side effects for all the Medicines you take and that have been prescribed to you. Take any new Medicines after you have completely understood and accept all the possible adverse reactions/side effects. Wear Seat belts while driving. If you have smoked or chewed Tobacco in the last 2 yrs please stop smoking and/or stop any Recreational drug use.   Increase activity slowly    Complete by:  As directed      Discharge Exam: Filed Weights   03/25/17 2301 03/26/17 0409 03/27/17 0507  Weight: 97.3 kg (214 lb 8.1 oz) 96.8 kg (213 lb 6.5 oz) 97 kg (213 lb 13.5 oz)   Vitals:   03/26/17 2114 03/27/17 0507  BP: 136/63 (!) 131/50  Pulse: 63 (!) 58  Resp: 20 20  Temp: 98.3 F (36.8 C) 98 F (36.7 C)  SpO2: 95% 96%   General: Appear in no distress, no Rash; Oral Mucosa moist. Cardiovascular: S1 and S2 Present, no Murmur, no JVD Respiratory: Bilateral Air entry present and Clear to Auscultation, no Crackles, no wheezes Abdomen: Bowel Sound present, Soft and no tenderness Extremities: no Pedal edema, no calf tenderness Neurology: Grossly no focal neuro deficit.  The results of significant diagnostics from this hospitalization (including imaging, microbiology, ancillary and laboratory) are listed below for reference.    Significant Diagnostic Studies: Dg Chest 2 View  Result Date: 03/24/2017 CLINICAL DATA:  Suspected sepsis. Pain fever and weakness. Recent ureteral stent placement. EXAM: CHEST  2 VIEW COMPARISON:  Chest radiographs 12/08/2012 FINDINGS: The cardiomediastinal contours are normal. Atherosclerosis of the  aortic arch. Mild vascular congestion. Suspect small pleural effusions. No confluent airspace disease. No pneumothorax. Reverse right shoulder arthroplasty, partially included. Degenerative change in the left shoulder and throughout the spine. No acute osseous abnormalities are seen. IMPRESSION: Vascular congestion.  Suspect small pleural effusions. Electronically Signed   By: Jeb Levering M.D.   On: 03/24/2017 23:37   Dg Abdomen 1 View  Addendum Date: 03/23/2017   ADDENDUM REPORT: 03/23/2017 03:44 ADDENDUM: Additionally, there is a 6 mm rounded calcification projecting over left SI joint confirmed to be ureteral stone on subsequent CT. Electronically Signed   By: Ashley Royalty M.D.   On: 03/23/2017 03:44   Result Date: 03/23/2017 CLINICAL DATA:  Bilateral renal stones. Right flank pain starting 6 hours ago. EXAM: ABDOMEN - 1 VIEW COMPARISON:  07/07/2016 abdomen radiographs.  FINDINGS: Clusters of calcifications are again noted of both kidneys projecting over the lower poles, the largest cluster on the right in aggregate is estimated at 2.6 x 1.4 cm and unchanged in appearance. The largest calcification noted on the left also projects over the lower pole measuring 1.9 x 1.2 cm. A previously seen 0.5 cm upper pole calcification is no longer apparent on the left. 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 No calcifications are identified along the course of the expected ureters. No bladder calculi are apparent. Bowel gas pattern is unremarkable apart from increased colonic stool burden within cecum and ascending colon to the hepatic flexure. IMPRESSION: 1. Bilateral stable lower pole renal calculi. No definite ureteral stones or bladder calcifications. 2. Increased stool burden within the cecum and ascending colon. 3. Levoscoliosis with lumbar spondylosis, stable in appearance. Electronically Signed: By: Ashley Royalty M.D. On: 03/23/2017 02:40   Dg Abd Portable 1v  Result Date:  03/25/2017 CLINICAL DATA:  Renal stent.  Fever. EXAM: PORTABLE ABDOMEN - 1 VIEW COMPARISON:  CT 03/23/2017 FINDINGS: Left ureteral stent in place, proximal coil in the region the renal pelvis common distal coli L in the region of the bladder. Probable 7 mm stone adjacent to the stent at the level of sacrum. 17 mm stone in the lower left kidney again seen. Multiple right renal stones also seen. No stones in the region of the right ureter. There is a normal bowel gas pattern. No free air. Degenerative change in the spine. IMPRESSION: 1. Left ureteral stent in place. Probable stone adjacent to the distal stent at the level of the mid sacrum. 2. Bilateral nonobstructing nephrolithiasis. Electronically Signed   By: Jeb Levering M.D.   On: 03/25/2017 03:13   Dg C-arm 1-60 Min-no Report  Result Date: 03/23/2017 Fluoroscopy was utilized by the requesting physician.  No radiographic interpretation.   Ct Renal Stone Study  Result Date: 03/23/2017 CLINICAL DATA:  Right flank pain starting 7 hours ago. EXAM: CT ABDOMEN AND PELVIS WITHOUT CONTRAST TECHNIQUE: Multidetector CT imaging of the abdomen and pelvis was performed following the standard protocol without IV contrast. COMPARISON:  Same day KUB FINDINGS: Lower chest: Normal size heart without pericardial effusion. Minimal pleural thickening at the left lung base. No pneumonic consolidation or pneumothorax. Hepatobiliary: No focal liver abnormality is seen given the limitations of a noncontrast study. No gallstones, gallbladder wall thickening, or biliary dilatation. Pancreas: Mild atrophy without focal mass or ductal dilatation. No inflammation. Spleen: Normal Adrenals/Urinary Tract: Normal bilateral adrenal glands. Left-sided perinephric fat stranding with mild hydroureteronephrosis attributable to a calculus measures 7 mm at the pelvic brim. No right-sided hydroureteronephrosis. Bilateral lower pole renal calculi are noted, the largest on the right measuring  1.7 x 1.1 cm adjacent to a 1.5 x 0.7 cm calculus. On the left, there is a lower pole 1.4 x 1.2 cm calculus all measured on the axial plane. 31.1 cm upper pole right renal cyst is noted. Stomach/Bowel: Normal appendix. The stomach, small intestine and large bowel are non acute. Scattered colonic diverticulosis at the junction of the descending sigmoid colon. No acute inflammation. Vascular/Lymphatic: Aortoiliac and branch vessel atherosclerosis. No aneurysm. No adenopathy. Reproductive: Normal size prostate. Other: No abdominopelvic ascites. Atrophy of the left oblique muscles. Musculoskeletal: Levoscoliosis and lumbar spondylosis. IMPRESSION: 1. 7 mm left ureteral stone with mild left-sided hydroureteronephrosis perinephric fat stranding. 2. Bilateral nonobstructing lower pole renal calculi as above. 3. Simple appearing 1 cm upper pole right renal cyst. 4. Left-sided  colonic diverticulosis without acute diverticulitis. Electronically Signed   By: Ashley Royalty M.D.   On: 03/23/2017 03:42    Microbiology: Recent Results (from the past 240 hour(s))  Urine culture     Status: Abnormal   Collection Time: 03/23/17  1:31 AM  Result Value Ref Range Status   Specimen Description Urine  Final   Special Requests NONE  Final   Culture >=100,000 COLONIES/mL ENTEROCOCCUS FAECALIS (A)  Final   Report Status 03/25/2017 FINAL  Final   Organism ID, Bacteria ENTEROCOCCUS FAECALIS (A)  Final      Susceptibility   Enterococcus faecalis - MIC*    AMPICILLIN <=2 SENSITIVE Sensitive     VANCOMYCIN 2 SENSITIVE Sensitive     GENTAMICIN SYNERGY SENSITIVE Sensitive     * >=100,000 COLONIES/mL ENTEROCOCCUS FAECALIS  Culture, blood (Routine x 2)     Status: None   Collection Time: 03/24/17 11:07 PM  Result Value Ref Range Status   Specimen Description BLOOD LEFT ANTECUBITAL  Final   Special Requests   Final    BOTTLES DRAWN AEROBIC AND ANAEROBIC Blood Culture adequate volume   Culture   Final    NO GROWTH 5  DAYS Performed at [facility], [address], [place], [place] [address]    Report Status 03/30/2017 FINAL  Final  Culture, blood (Routine x 2)     Status: None   Collection Time: 03/25/17  1:05 AM  Result Value Ref Range Status   Specimen Description BLOOD RIGHT ANTECUBITAL  Final   Special Requests   Final    BOTTLES DRAWN AEROBIC AND ANAEROBIC Blood Culture adequate volume   Culture   Final    NO GROWTH 5 DAYS Performed at [facility], [place] [address] [place], [place] [address]    Report Status 03/30/2017 FINAL  Final  MRSA PCR Screening     Status: None   Collection Time: 03/25/17  4:15 AM  Result Value Ref Range Status   MRSA by PCR NEGATIVE NEGATIVE Final    Comment:        The GeneXpert MRSA Assay (FDA approved for NASAL specimens only), is one component of a comprehensive MRSA colonization surveillance program. It is not intended to diagnose MRSA infection nor to guide or monitor treatment for MRSA infections.      Labs: CBC:  Recent Labs Lab 03/25/17 0908 03/26/17 0502 03/27/17 0422  WBC 15.8* 12.6* 9.8  NEUTROABS  --  9.8*  --   HGB 14.0 14.8 13.9  HCT 41.1 43.2 41.1  MCV 87.4 88.5 88.8  PLT 207 205 562   Basic Metabolic Panel:  Recent Labs Lab 03/25/17 0908 03/25/17 0915 03/26/17 0502 03/27/17 0422  NA  --  138 138 138  K  --  3.2* 3.2* 3.7  CL  --  104 104 106  CO2  --  24 26 24   GLUCOSE  --  125* 117* 115*  BUN  --  24* 19 22*  CREATININE  --  1.21 1.08 1.02  CALCIUM  --  8.1* 8.1* 7.9*  MG 1.8  --  1.7  --    Liver Function Tests:  Recent Labs Lab 03/26/17 0502  AST 23  ALT 22  ALKPHOS 36*  BILITOT 0.8  PROT 6.1*  ALBUMIN 3.0*   No results for input(s): LIPASE, AMYLASE in the last 168 hours. No results for input(s): AMMONIA in the last 168 hours. Cardiac Enzymes:  Recent Labs Lab 03/25/17 0915 03/25/17  1429  TROPONINI 0.22* 0.26*   BNP (last 3 results) No results for input(s): BNP in the last 8760  hours. CBG: No results for input(s): GLUCAP in the last 168 hours. Time spent: 35 minutes  Signed:  Daril Warga  [facility] 03/27/2017 , 12:09 AM

## 2017-04-02 ENCOUNTER — Ambulatory Visit (INDEPENDENT_AMBULATORY_CARE_PROVIDER_SITE_OTHER): Payer: PPO | Admitting: Cardiology

## 2017-04-02 ENCOUNTER — Encounter: Payer: Self-pay | Admitting: Cardiology

## 2017-04-02 VITALS — BP 142/70 | HR 70 | Ht 69.0 in | Wt 206.0 lb

## 2017-04-02 DIAGNOSIS — I10 Essential (primary) hypertension: Secondary | ICD-10-CM | POA: Diagnosis not present

## 2017-04-02 DIAGNOSIS — Z7901 Long term (current) use of anticoagulants: Secondary | ICD-10-CM | POA: Diagnosis not present

## 2017-04-02 DIAGNOSIS — I4892 Unspecified atrial flutter: Secondary | ICD-10-CM | POA: Insufficient documentation

## 2017-04-02 DIAGNOSIS — I483 Typical atrial flutter: Secondary | ICD-10-CM | POA: Diagnosis not present

## 2017-04-02 DIAGNOSIS — I480 Paroxysmal atrial fibrillation: Secondary | ICD-10-CM

## 2017-04-02 DIAGNOSIS — R0989 Other specified symptoms and signs involving the circulatory and respiratory systems: Secondary | ICD-10-CM | POA: Insufficient documentation

## 2017-04-02 DIAGNOSIS — N200 Calculus of kidney: Secondary | ICD-10-CM | POA: Diagnosis not present

## 2017-04-02 DIAGNOSIS — N10 Acute tubulo-interstitial nephritis: Secondary | ICD-10-CM | POA: Diagnosis not present

## 2017-04-02 DIAGNOSIS — Z23 Encounter for immunization: Secondary | ICD-10-CM | POA: Diagnosis not present

## 2017-04-02 NOTE — Progress Notes (Signed)
04/02/2017 Darrell Perez   16-May-1941  [id number]  Primary Physician Lavone Orn, MD Primary Cardiologist: Dr Percival Spanish  HPI:  76 y/o admitted 03/25/17 with urosepsis after recent ureteral stent placement. During his hospitalization he had an EKG 03/26/17 that indicated atrial flutter. He was placed on Eliquis and Diltiazem. He was discharged in NSR. Echo showed normal LVF with mild LAE. He is in the office today for follow up. He denies any palpitations or unusual dyspnea. He is in NSR today in the office.    Current Outpatient Prescriptions  Medication Sig Dispense Refill  . acetaminophen (TYLENOL) 500 MG tablet Take by mouth every 6 (six) hours as needed for mild pain, moderate pain, fever or headache.     . allopurinol (ZYLOPRIM) 100 MG tablet Take 100 mg by mouth daily.     [name] apixaban (ELIQUIS) 5 MG TABS tablet Take 1 tablet (5 mg total) by mouth 2 (two) times daily. 60 tablet 0  . cholecalciferol (VITAMIN D) 1000 UNITS tablet Take 1,000 Units by mouth daily.    [name] diltiazem (CARDIZEM CD) 300 MG 24 hr capsule Take 1 capsule (300 mg total) by mouth daily. 30 capsule 0  . HYDROcodone-acetaminophen (NORCO/VICODIN) 5-325 MG tablet Take 1 tablet by mouth every 6 (six) hours as needed for moderate pain.    . hyoscyamine (ANASPAZ) 0.125 MG TBDP disintergrating tablet Place 0.125 mg under the tongue every 6 (six) hours as needed for cramping.    . indapamide (LOZOL) 2.5 MG tablet Take 2.5 mg by mouth every evening.     . loperamide (IMODIUM) 2 MG capsule Take 1-2 mg by mouth as needed.    . loratadine (CLARITIN) 10 MG tablet Take 10 mg by mouth daily.    . polyethylene glycol (MIRALAX / GLYCOLAX) packet Take 17 g by mouth daily. 14 each 0  . potassium chloride SA (K-DUR,KLOR-CON) 20 MEQ tablet Take 20 mEq by mouth 2 (two) times daily.    [name] saccharomyces boulardii (FLORASTOR) 250 MG capsule Take 1 capsule (250 mg total) by mouth 2 (two) times daily. 20 capsule 0  . vitamin B-12  (CYANOCOBALAMIN) 1000 MCG tablet Take 1,000 mcg by mouth daily.     No current facility-administered medications for this visit.     Allergies  Allergen Reactions  . Naprosyn [Naproxen] Other (See Comments)    "makes me jittery/nervous"    Past Medical History:  Diagnosis Date  . Arthritis   . Asthma    15-20 yrs ago  . Atrial fibrillation and flutter ([place])   . GERD (gastroesophageal reflux disease)    NO ISSUES WHEN USING CPAP--  NO MEDS  . Goiter   . H/O hiatal hernia   . Hypertension   . Irritable bowel syndrome   . Left knee DJD   . Left ureteral calculus   . Nephrolithiasis 03/23/2017  . OSA on CPAP    cpap does not know settings     Social History   Social History  . Marital status: Married    Spouse name: N/A  . Number of children: N/A  . Years of education: N/A   Occupational History  . Not on file.   Social History Main Topics  . Smoking status: Never Smoker  . Smokeless tobacco: Never Used  . Alcohol use No  . Drug use: No  . Sexual activity: Not on file   Other Topics Concern  . Not on file   Social History Narrative  . No narrative  on file     Family History  Problem Relation Age of Onset  . Dementia Mother   . Dementia Father   . Anesthesia problems Neg Hx   . Hypotension Neg Hx   . Malignant hyperthermia Neg Hx   . Pseudochol deficiency Neg Hx      Review of Systems: General: negative for chills, fever, night sweats or weight changes.  Cardiovascular: negative for chest pain, dyspnea on exertion, edema, orthopnea, palpitations, paroxysmal nocturnal dyspnea or shortness of breath Dermatological: negative for rash Respiratory: negative for cough or wheezing Urologic: negative for hematuria Abdominal: negative for nausea, vomiting, diarrhea, bright red blood per rectum, melena, or hematemesis Neurologic: negative for visual changes, syncope, or dizziness All other systems reviewed and are otherwise negative except as noted  above.    Blood pressure (!) 142/70, pulse 70, height 5\' 9"  (1.753 m), weight 206 lb (93.4 kg), SpO2 96 %.  General appearance: alert, cooperative and NAD Neck: RCA bruit Lungs: clear to auscultation bilaterally Heart: regular rate and rhythm Extremities: extremities normal, atraumatic, no cyanosis or edema Skin: Skin color, texture, turgor normal. No rashes or lesions Neurologic: Grossly normal  EKG NSR, PVC, LVH  ASSESSMENT AND PLAN:   Atrial flutter (HCC) Atrial flutter (see EKG 03/26/17)  while hospitalized for urosepsis 9/25-9/28/18  The pt was discharged on Diltiazem and Eliquis with plans for OP Holter  Essential hypertension, benign Controlled-Echo 03/25/17- normal LVF, mild LVH, mildly enlarged LA (43MM)  Anticoagulated CHADS VASc= 2. Pt placed on Eliquis- OK to hold for urologic procedure  Right carotid bruit Asymptomatic   PLAN  Contniue current Rx. OK to hold Eliquis for urologic procedure per Dr Percival Spanish. I will arrange for a 7 day Holter, f/u in 2-3 weeks. Will discuss duration of Eliquis then with dr Percival Spanish. I also ordered a RCA doppler for bruit.   [name] PA-C 04/02/2017 11:56 AM

## 2017-04-02 NOTE — Assessment & Plan Note (Signed)
Asymptomatic. 

## 2017-04-02 NOTE — Assessment & Plan Note (Addendum)
Atrial flutter (see EKG 03/26/17)  while hospitalized for urosepsis 9/25-9/28/18  The pt was discharged on Diltiazem and Eliquis with plans for OP Holter

## 2017-04-02 NOTE — Patient Instructions (Signed)
Medication Instructions: Your physician recommends that you continue on your current medications as directed. Please refer to the Current Medication list given to you today.  If you need a refill on your cardiac medications before your next appointment, please call your pharmacy.   Procedures/Testing: Your physician has requested that you have a carotid duplex. This test is an ultrasound of the carotid arteries in your neck. It looks at blood flow through these arteries that supply the brain with blood. Allow one hour for this exam. There are no restrictions or special instructions. This will be done at [place], [address]  Your physician has recommended that you wear an 7 day event monitor. Event monitors are medical devices that record the heart's electrical activity. Doctors most often [place] these monitors to diagnose arrhythmias. Arrhythmias are problems with the speed or rhythm of the heartbeat. The monitor is a small, portable device. You can wear one while you do your normal daily activities. This is usually used to diagnose what is causing palpitations/syncope (passing out). This will be placed at [address]  Follow-Up: Your physician wants you to follow-up in: 2-3 weeks with Dr. Percival Spanish or with [name], PA when Dr. Percival Spanish in is the office.    Thank you for choosing [facility] at [facility]!!

## 2017-04-02 NOTE — Assessment & Plan Note (Signed)
CHADS VASc= 2. Pt placed on Eliquis- OK to hold for urologic procedure

## 2017-04-02 NOTE — Assessment & Plan Note (Signed)
Controlled-Echo 03/25/17- normal LVF, mild LVH, mildly enlarged LA (43MM)

## 2017-04-07 ENCOUNTER — Encounter (HOSPITAL_BASED_OUTPATIENT_CLINIC_OR_DEPARTMENT_OTHER): Payer: Self-pay | Admitting: *Deleted

## 2017-04-07 ENCOUNTER — Ambulatory Visit (HOSPITAL_COMMUNITY)
Admission: RE | Admit: 2017-04-07 | Discharge: 2017-04-07 | Disposition: A | Discharge status: Home / Self Care | Payer: PPO | Source: Ambulatory Visit | Attending: Cardiovascular Disease | Admitting: Cardiovascular Disease

## 2017-04-07 DIAGNOSIS — R0989 Other specified symptoms and signs involving the circulatory and respiratory systems: Secondary | ICD-10-CM | POA: Diagnosis not present

## 2017-04-07 DIAGNOSIS — I6523 Occlusion and stenosis of bilateral carotid arteries: Secondary | ICD-10-CM | POA: Diagnosis not present

## 2017-04-07 NOTE — Progress Notes (Addendum)
NPO AFTER MN.  ARRIVE AT 0093.  CURRENT LAB RESULTS (FROM PT PCP, DATED 04-02-2017)  AND EKG IN CHART AND EPIC.  WILL TAKE CARDIZEM AM DOS W/ SIPS OF WATER.  PER PT WAS TOLD OK TO STOP ELIQUIS 24 HOURS PRIOR TO SURGERY (CARDIOLOGY CLEARANCE IN CHART AND EPIC)

## 2017-04-08 ENCOUNTER — Telehealth: Payer: Self-pay | Admitting: Cardiology

## 2017-04-08 ENCOUNTER — Other Ambulatory Visit: Payer: Self-pay

## 2017-04-08 NOTE — Patient Outreach (Signed)
Transition of care:  Placed call to patient who reports that he is doing well. Plans to have kidney stone extraction in 2 days. Reports that he has gotten a call about surgery al ready.    Patient had questions about recent UA and procedure. I suggested patient call primary MD for lab results and to call urologist about his questions regarding surgery.    PLAN: will call patient in 1 week for follow up.  Tomasa Rand, RN, BSN, CEN [facility] [employer] [phone]

## 2017-04-08 NOTE — Telephone Encounter (Signed)
Spoke to wife. Reviewed Outside labs.  No changes with current treatment. Verbalized understanding.

## 2017-04-08 NOTE — Telephone Encounter (Signed)
New Message     Wife returning Ovid Curd call for lab results

## 2017-04-10 ENCOUNTER — Encounter (HOSPITAL_BASED_OUTPATIENT_CLINIC_OR_DEPARTMENT_OTHER)
Admission: RE | Disposition: A | Discharge status: Home / Self Care | Payer: Self-pay | Source: Ambulatory Visit | Attending: Urology

## 2017-04-10 ENCOUNTER — Ambulatory Visit (HOSPITAL_BASED_OUTPATIENT_CLINIC_OR_DEPARTMENT_OTHER)
Admission: RE | Admit: 2017-04-10 | Discharge: 2017-04-10 | Disposition: A | Discharge status: Home / Self Care | Payer: PPO | Source: Ambulatory Visit | Attending: Urology | Admitting: Urology

## 2017-04-10 ENCOUNTER — Ambulatory Visit (HOSPITAL_BASED_OUTPATIENT_CLINIC_OR_DEPARTMENT_OTHER): Payer: PPO | Admitting: Anesthesiology

## 2017-04-10 ENCOUNTER — Encounter (HOSPITAL_BASED_OUTPATIENT_CLINIC_OR_DEPARTMENT_OTHER): Payer: Self-pay

## 2017-04-10 DIAGNOSIS — I10 Essential (primary) hypertension: Secondary | ICD-10-CM | POA: Insufficient documentation

## 2017-04-10 DIAGNOSIS — Z96653 Presence of artificial knee joint, bilateral: Secondary | ICD-10-CM | POA: Diagnosis not present

## 2017-04-10 DIAGNOSIS — N390 Urinary tract infection, site not specified: Secondary | ICD-10-CM | POA: Diagnosis not present

## 2017-04-10 DIAGNOSIS — G4733 Obstructive sleep apnea (adult) (pediatric): Secondary | ICD-10-CM | POA: Insufficient documentation

## 2017-04-10 DIAGNOSIS — N202 Calculus of kidney with calculus of ureter: Secondary | ICD-10-CM | POA: Diagnosis not present

## 2017-04-10 DIAGNOSIS — Z7982 Long term (current) use of aspirin: Secondary | ICD-10-CM | POA: Diagnosis not present

## 2017-04-10 DIAGNOSIS — B952 Enterococcus as the cause of diseases classified elsewhere: Secondary | ICD-10-CM | POA: Diagnosis not present

## 2017-04-10 DIAGNOSIS — Z9989 Dependence on other enabling machines and devices: Secondary | ICD-10-CM | POA: Insufficient documentation

## 2017-04-10 DIAGNOSIS — J45909 Unspecified asthma, uncomplicated: Secondary | ICD-10-CM | POA: Diagnosis not present

## 2017-04-10 DIAGNOSIS — I4891 Unspecified atrial fibrillation: Secondary | ICD-10-CM | POA: Diagnosis not present

## 2017-04-10 DIAGNOSIS — N201 Calculus of ureter: Secondary | ICD-10-CM

## 2017-04-10 HISTORY — DX: Personal history of other diseases of the respiratory system: Z87.09

## 2017-04-10 HISTORY — PX: CYSTOSCOPY/URETEROSCOPY/HOLMIUM LASER/STENT PLACEMENT: SHX6546

## 2017-04-10 HISTORY — DX: Diaphragmatic hernia without obstruction or gangrene: K44.9

## 2017-04-10 HISTORY — DX: Personal history of other endocrine, nutritional and metabolic disease: Z86.39

## 2017-04-10 HISTORY — DX: Paroxysmal atrial fibrillation: I48.0

## 2017-04-10 SURGERY — CYSTOSCOPY/URETEROSCOPY/HOLMIUM LASER/STENT PLACEMENT
Anesthesia: General | Laterality: Left

## 2017-04-10 MED ORDER — KETOROLAC TROMETHAMINE 30 MG/ML IJ SOLN
INTRAMUSCULAR | Status: AC
  Filled 2017-04-10: qty 1

## 2017-04-10 MED ORDER — LACTATED RINGERS IV SOLN
INTRAVENOUS | Status: DC
  Administered 2017-04-10 (×2): via INTRAVENOUS
  Administered 2017-04-10: 1000 mL via INTRAVENOUS
  Filled 2017-04-10: qty 1000

## 2017-04-10 MED ORDER — ONDANSETRON HCL 4 MG/2ML IJ SOLN
INTRAMUSCULAR | Status: DC | PRN
  Administered 2017-04-10: 4 mg via INTRAVENOUS

## 2017-04-10 MED ORDER — ONDANSETRON HCL 4 MG/2ML IJ SOLN
INTRAMUSCULAR | Status: AC
  Filled 2017-04-10: qty 4

## 2017-04-10 MED ORDER — PROPOFOL 10 MG/ML IV BOLUS
INTRAVENOUS | Status: AC
  Filled 2017-04-10: qty 20

## 2017-04-10 MED ORDER — FENTANYL CITRATE (PF) 100 MCG/2ML IJ SOLN
INTRAMUSCULAR | Status: DC | PRN
  Administered 2017-04-10 (×4): 25 ug via INTRAVENOUS
  Administered 2017-04-10: 50 ug via INTRAVENOUS

## 2017-04-10 MED ORDER — DEXAMETHASONE SODIUM PHOSPHATE 10 MG/ML IJ SOLN
INTRAMUSCULAR | Status: AC
  Filled 2017-04-10: qty 2

## 2017-04-10 MED ORDER — FENTANYL CITRATE (PF) 100 MCG/2ML IJ SOLN
INTRAMUSCULAR | Status: AC
Start: 2017-04-10 — End: 2017-04-10
  Filled 2017-04-10: qty 2

## 2017-04-10 MED ORDER — AMOXICILLIN-POT CLAVULANATE 875-125 MG PO TABS: 1.0000 | ORAL_TABLET | Freq: Every day | ORAL | 0 refills | Status: DC

## 2017-04-10 MED ORDER — PROPOFOL 10 MG/ML IV BOLUS
INTRAVENOUS | Status: DC | PRN
  Administered 2017-04-10: 120 mg via INTRAVENOUS

## 2017-04-10 MED ORDER — FENTANYL CITRATE (PF) 100 MCG/2ML IJ SOLN
25.0000 ug | INTRAMUSCULAR | Status: DC | PRN
  Filled 2017-04-10: qty 1

## 2017-04-10 MED ORDER — GENTAMICIN SULFATE 40 MG/ML IJ SOLN
5.0000 mg/kg | Freq: Once | INTRAVENOUS | Status: AC
  Administered 2017-04-10: 390 mg via INTRAVENOUS
  Filled 2017-04-10 (×2): qty 9.75

## 2017-04-10 MED ORDER — LIDOCAINE 2% (20 MG/ML) 5 ML SYRINGE
INTRAMUSCULAR | Status: AC
  Filled 2017-04-10: qty 20

## 2017-04-10 MED ORDER — LIDOCAINE 2% (20 MG/ML) 5 ML SYRINGE
INTRAMUSCULAR | Status: DC | PRN
  Administered 2017-04-10: 20 mg via INTRAVENOUS

## 2017-04-10 MED ORDER — VANCOMYCIN HCL IN DEXTROSE 1-5 GM/200ML-% IV SOLN
1000.0000 mg | Freq: Once | INTRAVENOUS | Status: AC
  Administered 2017-04-10: 1000 mg via INTRAVENOUS
  Filled 2017-04-10 (×2): qty 200

## 2017-04-10 MED ORDER — PHENYLEPHRINE 40 MCG/ML (10ML) SYRINGE FOR IV PUSH (FOR BLOOD PRESSURE SUPPORT)
PREFILLED_SYRINGE | INTRAVENOUS | Status: AC
  Filled 2017-04-10: qty 20

## 2017-04-10 MED ORDER — EPHEDRINE 5 MG/ML INJ
INTRAVENOUS | Status: AC
  Filled 2017-04-10: qty 10

## 2017-04-10 MED ORDER — DEXAMETHASONE SODIUM PHOSPHATE 10 MG/ML IJ SOLN
INTRAMUSCULAR | Status: DC | PRN
  Administered 2017-04-10: 10 mg via INTRAVENOUS

## 2017-04-10 MED ORDER — FENTANYL CITRATE (PF) 100 MCG/2ML IJ SOLN
INTRAMUSCULAR | Status: AC
  Filled 2017-04-10: qty 2

## 2017-04-10 SURGICAL SUPPLY — 33 items
BAG DRAIN URO-CYSTO SKYTR STRL (DRAIN) ×3 IMPLANT
BAG DRN UROCATH (DRAIN) ×1
BASKET LASER NITINOL 1.9FR (BASKET) IMPLANT
BASKET ZERO TIP NITINOL 2.4FR (BASKET) IMPLANT
BSKT STON RTRVL 120 1.9FR (BASKET)
BSKT STON RTRVL ZERO TP 2.4FR (BASKET)
CATH INTERMIT  6FR 70CM (CATHETERS) IMPLANT
CATH URET 5FR 28IN CONE TIP (BALLOONS)
CATH URET 5FR 70CM CONE TIP (BALLOONS) IMPLANT
CATH URET DUAL LUMEN 6-10FR 50 (CATHETERS) IMPLANT
CLOTH BEACON ORANGE TIMEOUT ST (SAFETY) ×3 IMPLANT
EXTRACTOR STONE 1.7FRX115CM (UROLOGICAL SUPPLIES) ×2 IMPLANT
FIBER LASER FLEXIVA 200 (UROLOGICAL SUPPLIES) ×2 IMPLANT
FIBER LASER FLEXIVA 365 (UROLOGICAL SUPPLIES) IMPLANT
FIBER LASER TRAC TIP (UROLOGICAL SUPPLIES) IMPLANT
GLOVE BIO SURGEON STRL SZ7.5 (GLOVE) ×3 IMPLANT
GOWN STRL REUS W/TWL LRG LVL3 (GOWN DISPOSABLE) ×3 IMPLANT
GOWN STRL REUS W/TWL XL LVL3 (GOWN DISPOSABLE) IMPLANT
GUIDEWIRE 0.038 PTFE COATED (WIRE) IMPLANT
GUIDEWIRE ANG ZIPWIRE 038X150 (WIRE) ×3 IMPLANT
GUIDEWIRE STR DUAL SENSOR (WIRE) ×3 IMPLANT
INFUSOR MANOMETER BAG 3000ML (MISCELLANEOUS) ×3 IMPLANT
IV NS 1000ML (IV SOLUTION) ×3
IV NS 1000ML BAXH (IV SOLUTION) IMPLANT
IV NS IRRIG 3000ML ARTHROMATIC (IV SOLUTION) ×4 IMPLANT
KIT RM TURNOVER CYSTO AR (KITS) ×3 IMPLANT
MANIFOLD NEPTUNE II (INSTRUMENTS) ×3 IMPLANT
NS IRRIG 500ML POUR BTL (IV SOLUTION) ×1 IMPLANT
PACK CYSTO (CUSTOM PROCEDURE TRAY) ×3 IMPLANT
SHEATH ACCESS URETERAL 38CM (SHEATH) ×3 IMPLANT
STENT URET 6FRX26 CONTOUR (STENTS) ×3 IMPLANT
TUBE CONNECTING 12'X1/4 (SUCTIONS) ×1
TUBE CONNECTING 12X1/4 (SUCTIONS) ×1 IMPLANT

## 2017-04-10 NOTE — Interval H&P Note (Signed)
History and Physical Interval Note:  04/10/2017 12:21 PM  Darrell Perez  has presented today for surgery, with the diagnosis of LEFT URETERAL STONE AND LEFT RENAL STONE  The various methods of treatment have been discussed with the patient and family. After consideration of risks, benefits and other options for treatment, the patient has consented to  Procedure(s): CYSTOSCOPY/URETEROSCOPY/HOLMIUM LASER/STENT PLACEMENT (Left) as a surgical intervention .  He has been well. He completed his abx, but he and his wife aren't sure they took the one I sent and we notified him of on Oct 3 (see task). He has been treated appropriately, he looks well and had no dysuria or fever. He has some urgency and bladder pain at the end of voiding. Discussed with patient and wife risk of infection but I feel it's safe to proceed. The patient's history has been reviewed, patient examined, no change in status, stable for surgery.  I have reviewed the patient's chart and labs.  Questions were answered to the patient's satisfaction.     [name]

## 2017-04-10 NOTE — H&P (View-Only) (Signed)
Consult: Urinary tract infection, fever, left ureteral stone/stent Requested by: Dr. Montine Circle  History of Present Illness: 76 year old male patient of mine who underwent urgent left ureteral stent with Dr. Gloriann Loan March 23, 2017 her urinary tract infection and a left mid ureteral stone of 7 mm.  He was discharged on Cipro, but had malaise and felt poorly.  He also had fever. In the emergency department last night he was found to be in A. fib with RVR.  He's been resuscitated and feeling much better today.  He continues a diltiazem drip.    Urine culture is growing enterococcus with sensitivities pending.  He had been on Rocephin while in hospital.   His white count went from 21,000-15,000.  His creatinine has improved to 1.2.  A KUB was done which showed the left stent to be in good position. I reviewed the image and compared to CT images. He is voiding with a good stream and feels empty.    Past Medical History:  Diagnosis Date  . Arthritis   . Asthma    15-20 yrs ago  . GERD (gastroesophageal reflux disease)    NO ISSUES WHEN USING CPAP--  NO MEDS  . Goiter   . H/O hiatal hernia   . Hypertension   . Left knee DJD   . Left ureteral calculus   . Nephrolithiasis 03/23/2017  . OSA on CPAP    cpap does not know settings    Past Surgical History:  Procedure Laterality Date  . CARPAL TUNNEL RELEASE Bilateral [employer]   . CATARACT EXTRACTION W/ INTRAOCULAR LENS  IMPLANT, BILATERAL  2005  &  2012  . CYSTOSCOPY WITH STENT PLACEMENT Left 03/23/2017   Procedure: CYSTOSCOPY, LEFT RETROGRADE WITH STENT PLACEMENT;  Surgeon: Lucas Mallow, MD;  Location: [facility];  Service: Urology;  Laterality: Left;  . EXTRACORPOREAL SHOCK WAVE LITHOTRIPSY     MULTIPLE  . HEMIARTHROPLASTY RIGHT SHOULDER  2007  . HOLMIUM LASER APPLICATION Left 07/30/8655   Procedure: HOLMIUM LASER APPLICATION;  Surgeon: Fredricka Bonine, MD;  Location: [facility];  Service: Urology;   Laterality: Left;  . [place]   removal of stones. (OPEN PROCUDURE)  . REVERSE SHOULDER ARTHROPLASTY  10/09/2011   Procedure: REVERSE SHOULDER ARTHROPLASTY;  Surgeon: Marin Shutter, MD;  Location: [place];  Service: Orthopedics;  Laterality: Right;  right hardware removal and reverse shoulder arthroplasty  . THYROID LOBECTOMY  1967   goiter removed  . TONSILLECTOMY    . TOTAL KNEE ARTHROPLASTY  07/22/2012   Procedure: TOTAL KNEE ARTHROPLASTY;  Surgeon: Johnn Hai, MD;  Location: [facility];  Service: Orthopedics;  Laterality: Right;  . TOTAL KNEE ARTHROPLASTY Left 12/16/2012   Procedure: LEFT TOTAL KNEE ARTHROPLASTY;  Surgeon: Johnn Hai, MD;  Location: [facility];  Service: Orthopedics;  Laterality: Left;  . TRIGGER FINGER RELEASE Right 2006  . [place] EXTRACTION  2002    Home Medications:  Prescriptions Prior to Admission  Medication Sig Dispense Refill Last Dose  . acetaminophen (TYLENOL) 500 MG tablet Take by mouth every 6 (six) hours as needed for mild pain, moderate pain, fever or headache.    03/24/2017 at Unknown time  . allopurinol (ZYLOPRIM) 100 MG tablet Take 100 mg by mouth daily.    03/24/2017 at Unknown time  . aspirin EC 81 MG tablet Take 81 mg by mouth daily.   03/24/2017 at Unknown time  . cholecalciferol (VITAMIN  D) 1000 UNITS tablet Take 1,000 Units by mouth daily.   03/24/2017 at Unknown time  . ciprofloxacin (CIPRO) 500 MG tablet Take 1 tablet (500 mg total) by mouth 2 (two) times daily. For 5days 10 tablet 0 03/25/2017 at Unknown time  . HYDROcodone-acetaminophen (NORCO/VICODIN) 5-325 MG tablet Take 1 tablet by mouth every 6 (six) hours as needed for moderate pain.   Past Week at Unknown time  . hyoscyamine (ANASPAZ) 0.125 MG TBDP disintergrating tablet Place 0.125 mg under the tongue every 6 (six) hours as needed for cramping.   unknown  . indapamide (LOZOL) 2.5 MG tablet Take 2.5 mg by mouth every evening.    03/24/2017 at Unknown time   . loperamide (IMODIUM A-D) 2 MG tablet Take 2-4 mg by mouth 4 (four) times daily as needed for diarrhea or loose stools.   unknown  . loratadine (CLARITIN) 10 MG tablet Take 10 mg by mouth daily.   03/24/2017 at Unknown time  . pindolol (VISKEN) 5 MG tablet Take 5 mg by mouth every evening.    03/23/2017 at 1700  . potassium chloride SA (K-DUR,KLOR-CON) 20 MEQ tablet Take 20 mEq by mouth 2 (two) times daily.   03/24/2017 at Unknown time  . vitamin B-12 (CYANOCOBALAMIN) 1000 MCG tablet Take 1,000 mcg by mouth daily.   03/24/2017 at Unknown time   Allergies:  Allergies  Allergen Reactions  . Naprosyn [Naproxen] Other (See Comments)    "makes me jittery/nervous"    Family History  Problem Relation Age of Onset  . Dementia Mother   . Dementia Father   . Anesthesia problems Neg Hx   . Hypotension Neg Hx   . Malignant hyperthermia Neg Hx   . Pseudochol deficiency Neg Hx    Social History:  reports that he has never smoked. He has never used smokeless tobacco. He reports that he does not drink alcohol or use drugs.  ROS: A complete review of systems was performed.  All systems are negative except for pertinent findings as noted. ROS   Physical Exam:  Vital signs in last 24 hours: Temp:  [98 F (36.7 C)-101 F (38.3 C)] 98.3 F (36.8 C) (09/26 1347) Pulse Rate:  [40-144] 68 (09/26 1400) Resp:  [17-25] 21 (09/26 1400) BP: (101-179)/(45-120) 130/45 (09/26 1400) SpO2:  [94 %-97 %] 95 % (09/26 1400) Weight:  [96.7 kg (213 lb 3 oz)] 96.7 kg (213 lb 3 oz) (09/26 0422) General:  Alert and oriented, No acute distress, sitting in a chair  HEENT: Normocephalic, atraumatic Neck: No JVD or lymphadenopathy Cardiovascular: Regular rate and rhythm Lungs: Regular rate and effort Abdomen: Soft, nontender, nondistended, no abdominal masses Back: No CVA tenderness Extremities: No edema Neurologic: Grossly intact  Laboratory Data:  Results for orders placed or performed during the hospital  encounter of 03/24/17 (from the past 24 hour(s))  Comprehensive metabolic panel     Status: Abnormal   Collection Time: 03/24/17 11:07 PM  Result Value Ref Range   Sodium 139 135 - 145 mmol/L   Potassium 3.5 3.5 - 5.1 mmol/L   Chloride 104 101 - 111 mmol/L   CO2 25 22 - 32 mmol/L   Glucose, Bld 92 65 - 99 mg/dL   BUN 27 (H) 6 - 20 mg/dL   Creatinine, Ser 1.33 (H) 0.61 - 1.24 mg/dL   Calcium 8.8 (L) 8.9 - 10.3 mg/dL   Total Protein 6.9 6.5 - 8.1 g/dL   Albumin 3.5 3.5 - 5.0 g/dL   AST 36 15 -  41 U/L   ALT 28 17 - 63 U/L   Alkaline Phosphatase 45 38 - 126 U/L   Total Bilirubin 0.7 0.3 - 1.2 mg/dL   GFR calc non Af Amer 50 (L) >60 mL/min   GFR calc Af Amer 58 (L) >60 mL/min   Anion gap 10 5 - 15  CBC with Differential     Status: Abnormal   Collection Time: 03/24/17 11:07 PM  Result Value Ref Range   WBC 21.4 (H) 4.0 - 10.5 K/uL   RBC 5.13 4.22 - 5.81 MIL/uL   Hemoglobin 15.8 13.0 - 17.0 g/dL   HCT 45.5 39.0 - 52.0 %   MCV 88.7 78.0 - 100.0 fL   MCH 30.8 26.0 - 34.0 pg   MCHC 34.7 30.0 - 36.0 g/dL   RDW 14.5 11.5 - 15.5 %   Platelets 228 150 - 400 K/uL   Neutrophils Relative % 85 %   Neutro Abs 18.2 (H) 1.7 - 7.7 K/uL   Lymphocytes Relative 5 %   Lymphs Abs 1.0 0.7 - 4.0 K/uL   Monocytes Relative 10 %   Monocytes Absolute 2.1 (H) 0.1 - 1.0 K/uL   Eosinophils Relative 0 %   Eosinophils Absolute 0.0 0.0 - 0.7 K/uL   Basophils Relative 0 %   Basophils Absolute 0.0 0.0 - 0.1 K/uL  Protime-INR     Status: None   Collection Time: 03/24/17 11:07 PM  Result Value Ref Range   Prothrombin Time 12.9 11.4 - 15.2 seconds   INR 0.98   CG4 I-STAT (Lactic acid)     Status: None   Collection Time: 03/24/17 11:19 PM  Result Value Ref Range   Lactic Acid, Venous 1.04 0.5 - 1.9 mmol/L  Urinalysis, Routine w reflex microscopic     Status: Abnormal   Collection Time: 03/24/17 11:27 PM  Result Value Ref Range   Color, Urine YELLOW YELLOW   APPearance HAZY (A) CLEAR   Specific Gravity,  Urine 1.014 1.005 - 1.030   pH 5.0 5.0 - 8.0   Glucose, UA NEGATIVE NEGATIVE mg/dL   Hgb urine dipstick LARGE (A) NEGATIVE   Bilirubin Urine NEGATIVE NEGATIVE   Ketones, ur NEGATIVE NEGATIVE mg/dL   Protein, ur 30 (A) NEGATIVE mg/dL   Nitrite NEGATIVE NEGATIVE   Leukocytes, UA MODERATE (A) NEGATIVE   RBC / HPF TOO NUMEROUS TO COUNT 0 - 5 RBC/hpf   WBC, UA TOO NUMEROUS TO COUNT 0 - 5 WBC/hpf   Bacteria, UA RARE (A) NONE SEEN   Squamous Epithelial / LPF NONE SEEN NONE SEEN   Mucus PRESENT    Hyaline Casts, UA PRESENT   MRSA PCR Screening     Status: None   Collection Time: 03/25/17  4:15 AM  Result Value Ref Range   MRSA by PCR NEGATIVE NEGATIVE  CBC     Status: Abnormal   Collection Time: 03/25/17  9:08 AM  Result Value Ref Range   WBC 15.8 (H) 4.0 - 10.5 K/uL   RBC 4.70 4.22 - 5.81 MIL/uL   Hemoglobin 14.0 13.0 - 17.0 g/dL   HCT 41.1 39.0 - 52.0 %   MCV 87.4 78.0 - 100.0 fL   MCH 29.8 26.0 - 34.0 pg   MCHC 34.1 30.0 - 36.0 g/dL   RDW 14.7 11.5 - 15.5 %   Platelets 207 150 - 400 K/uL  Magnesium     Status: None   Collection Time: 03/25/17  9:08 AM  Result Value Ref Range  Magnesium 1.8 1.7 - 2.4 mg/dL  TSH     Status: None   Collection Time: 03/25/17  9:15 AM  Result Value Ref Range   TSH 0.439 0.350 - 4.500 uIU/mL  Basic metabolic panel     Status: Abnormal   Collection Time: 03/25/17  9:15 AM  Result Value Ref Range   Sodium 138 135 - 145 mmol/L   Potassium 3.2 (L) 3.5 - 5.1 mmol/L   Chloride 104 101 - 111 mmol/L   CO2 24 22 - 32 mmol/L   Glucose, Bld 125 (H) 65 - 99 mg/dL   BUN 24 (H) 6 - 20 mg/dL   Creatinine, Ser 1.21 0.61 - 1.24 mg/dL   Calcium 8.1 (L) 8.9 - 10.3 mg/dL   GFR calc non Af Amer 56 (L) >60 mL/min   GFR calc Af Amer >60 >60 mL/min   Anion gap 10 5 - 15  Troponin I     Status: Abnormal   Collection Time: 03/25/17  9:15 AM  Result Value Ref Range   Troponin I 0.22 (HH) <0.03 ng/mL   Recent Results (from the past 240 hour(s))  Urine culture      Status: Abnormal   Collection Time: 03/23/17  1:31 AM  Result Value Ref Range Status   Specimen Description Urine  Final   Special Requests NONE  Final   Culture >=100,000 COLONIES/mL ENTEROCOCCUS FAECALIS (A)  Final   Report Status 03/25/2017 FINAL  Final   Organism ID, Bacteria ENTEROCOCCUS FAECALIS (A)  Final      Susceptibility   Enterococcus faecalis - MIC*    AMPICILLIN <=2 SENSITIVE Sensitive     VANCOMYCIN 2 SENSITIVE Sensitive     GENTAMICIN SYNERGY SENSITIVE Sensitive     * >=100,000 COLONIES/mL ENTEROCOCCUS FAECALIS  MRSA PCR Screening     Status: None   Collection Time: 03/25/17  4:15 AM  Result Value Ref Range Status   MRSA by PCR NEGATIVE NEGATIVE Final    Comment:        The GeneXpert MRSA Assay (FDA approved for NASAL specimens only), is one component of a comprehensive MRSA colonization surveillance program. It is not intended to diagnose MRSA infection nor to guide or monitor treatment for MRSA infections.    Creatinine:  Recent Labs  03/23/17 0157 03/24/17 0601 03/24/17 2307 03/25/17 0915  CREATININE 1.25* 1.33* 1.33* 1.21    Impression/Assessment/plan: Urine tract infection-patient responding well to IV antibiotics.  Sensitivities pending.  These responded well to treatment and therefore I don't feel there is anything more sinister going on such as an abscess or stent malposition.  Ureteral stone, left-status post left ureteral stent.  Stent appears to be in good position on the KUB. He'll need ureteroscopy down the road.  Will follow.  Appreciate excellent TRH care.   Annalucia Laino 03/25/2017, 3:52 PM

## 2017-04-10 NOTE — Anesthesia Preprocedure Evaluation (Addendum)
Anesthesia Evaluation  Patient identified by MRN, date of birth, ID band Patient awake    Reviewed: Allergy & Precautions, NPO status , Patient's Chart, lab work & pertinent test results  History of Anesthesia Complications Negative for: history of anesthetic complications  Airway Mallampati: II  TM Distance: >3 FB Neck ROM: Full    Dental  (+) Caps, Dental Advisory Given   Pulmonary asthma , sleep apnea and Continuous Positive Airway Pressure Ventilation ,    breath sounds clear to auscultation       Cardiovascular hypertension, Pt. on medications (-) angina+ dysrhythmias Atrial Fibrillation  Rhythm:Irregular Rate:Normal  9/18 ECHO: EF 60-65%, valves OK   Neuro/Psych negative neurological ROS     GI/Hepatic Neg liver ROS, GERD  Controlled,  Endo/Other  negative endocrine ROS  Renal/GU stones     Musculoskeletal  (+) Arthritis , Osteoarthritis,    Abdominal   Peds  Hematology eliquis   Anesthesia Other Findings   Reproductive/Obstetrics                            Anesthesia Physical Anesthesia Plan  ASA: III  Anesthesia Plan: General   Post-op Pain Management:    Induction: Intravenous  PONV Risk Score and Plan: 2 and Ondansetron and Dexamethasone  Airway Management Planned: LMA  Additional Equipment:   Intra-op Plan:   Post-operative Plan:   Informed Consent: I have reviewed the patients History and Physical, chart, labs and discussed the procedure including the risks, benefits and alternatives for the proposed anesthesia with the patient or authorized representative who has indicated his/her understanding and acceptance.   Dental advisory given  Plan Discussed with: CRNA and Surgeon  Anesthesia Plan Comments: (Plan routine monitors, GA- LMA OK)        Anesthesia Quick Evaluation

## 2017-04-10 NOTE — Op Note (Signed)
Preoperative diagnosis: Left ureteral stone, left renal stone Postoperative diagnosis: Left ureteral stone, left renal stone  Procedure: Cystoscopy left ureteroscopy, holmium laser lithotripsy, stone basket extraction, ureteral stent exchange  Surgeon: Junious Silk  Anesthesia: Gen.  Indication for procedure: 47 show male urgent stent for left mid ureteral stone and enterococcal UTI. He presents today for definitive stone management. He understood the ureteral stone was the main focus but we would approach the lower pole stone all of may be difficult access for may need a staged procedure.  Findings: Stone and mid ureter removed completely. Very dense. Also, very dense stone in the left lower pole and I was able to shave off the top third would not get very good access to it.  Description of procedure: After consent was obtained patient brought the operating room. After adequate anesthesia he was placed in lithotomy position and prepped and draped in usual sterile fashion. A timeout was performed to confirm the patient and procedure. The cystoscope was passed per urethra and the left ureteral stent grasped and removed through the urethral meatus through which a sensor wire was advanced. Adjacent to the sensor wire semirigid ureteroscope was passed and the stone was located in the mid ureter. It was fragmented at 0.8 and 8 into small pieces which were sequentially removed with the [place] basket and dropped in the bladder. I could not get much more proximal than this with the scope and the Glidewire was passed under direct vision. I then placed a ureteral access sheath leaving the Glidewire as the safety wire. The access sheath went without difficulty. The collecting system was visualized in the stone was located in the lower pole. It was very large and max flexion of the scope was required. The top of it was shaved off with the laser and I couldn't get much purchase on the rest of the stone. He was also too  big to grasp removed with the [place] basket the access to it again was also limited. Within the lower pole more posterior calyx. Therefore I decided to terminate the procedure. The collecting system was inspected and noted noticed 1 stone fragment which was grasped with the [place] and removed. Under fluoroscopy no other fragments remained only the lower pole stone remnant. The ureter was inspected on the way out and noted to be free from injury or stone fragment. A 6 x 26 cm stent was then advanced after replacing the cystoscope. A good coil was seen in the kidney and a good coil in the bladder. The bladder was drained and the scope removed. He was awakened and taken to the recovery room in stable condition.  Complications: None  Blood loss: Minimal  Specimens: None  Drains: 6 x 26 and a meter LEFT ureteral stent with string

## 2017-04-10 NOTE — Discharge Instructions (Signed)
Ureteral Stent Implantation, Care After Refer to this sheet in the next few weeks. These instructions provide you with information about caring for yourself after your procedure. Your health care provider may also give you more specific instructions. Your treatment has been planned according to current medical practices, but problems sometimes occur. Call your health care provider if you have any problems or questions after your procedure.  Remove the stent by pulling the string with slow steady pressure on Tuesday morning, 04/14/2017.  What can I expect after the procedure? After the procedure, it is common to have:  Nausea.  Mild pain when you urinate. You may feel this pain in your lower back or lower abdomen. Pain should stop within a few minutes after you urinate. This may last for up to 1 week.  A small amount of blood in your urine for several days.  Follow these instructions at home:  Medicines  Take over-the-counter and prescription medicines only as told by your health care provider.  If you were prescribed an antibiotic medicine, take it as told by your health care provider. Do not stop taking the antibiotic even if you start to feel better.  Do not drive for 24 hours if you received a sedative.  Do not drive or operate heavy machinery while taking prescription pain medicines. Activity  Return to your normal activities as told by your health care provider. Ask your health care provider what activities are safe for you.  Do not lift anything that is heavier than 10 lb (4.5 kg). Follow this limit for 1 week after your procedure, or for as long as told by your health care provider. General instructions  Watch for any blood in your urine. Call your health care provider if the amount of blood in your urine increases.  If you have a catheter: ? Follow instructions from your health care provider about taking care of your catheter and collection bag. ? Do not take baths, swim,  or use a hot tub until your health care provider approves.  Drink enough fluid to keep your urine clear or pale yellow.  Keep all follow-up visits as told by your health care provider. This is important. Contact a health care provider if:  You have pain that gets worse or does not get better with medicine, especially pain when you urinate.  You have difficulty urinating.  You feel nauseous or you vomit repeatedly during a period of more than 2 days after the procedure. Get help right away if:  Your urine is dark red or has blood clots in it.  You are leaking urine (have incontinence).  The end of the stent comes out of your urethra.  You cannot urinate.  You have sudden, sharp, or severe pain in your abdomen or lower back.  You have a fever. This information is not intended to replace advice given to you by your health care provider. Make sure you discuss any questions you have with your health care provider. Document Released: 02/16/2013 Document Revised: 11/22/2015 Document Reviewed: 12/29/2014 Elsevier Interactive Patient Education  2018 [place] [facility] Care Instructions  Activity: Get plenty of rest for the remainder of the day. A responsible individual must stay with you for 24 hours following the procedure.  For the next 24 hours, DO NOT: -Drive a car -Paediatric nurse -Drink alcoholic beverages -Take any medication unless instructed by your physician -Make any legal decisions or sign important papers.  Meals: Start with liquid foods such  as gelatin or soup. Progress to regular foods as tolerated. Avoid greasy, spicy, heavy foods. If nausea and/or vomiting occur, drink only clear liquids until the nausea and/or vomiting subsides. Call your physician if vomiting continues.  Special Instructions/Symptoms: Your throat may feel dry or sore from the anesthesia or the breathing tube placed in your throat during surgery. If this causes discomfort,  gargle with warm salt water. The discomfort should disappear within 24 hours.  If you had a scopolamine patch placed behind your ear for the management of post- operative nausea and/or vomiting:  1. The medication in the patch is effective for 72 hours, after which it should be removed.  Wrap patch in a tissue and discard in the trash. Wash hands thoroughly with soap and water. 2. You may remove the patch earlier than 72 hours if you experience unpleasant side effects which may include dry mouth, dizziness or visual disturbances. 3. Avoid touching the patch. Wash your hands with soap and water after contact with the patch.

## 2017-04-10 NOTE — Transfer of Care (Signed)
Immediate Anesthesia Transfer of Care Note  Patient: Darrell Perez  Procedure(s) Performed: CYSTOSCOPY/URETEROSCOPY/HOLMIUM LASER/STENT PLACEMENT (Left )  Patient Location: PACU  Anesthesia Type:General  Level of Consciousness: sedated  Airway & Oxygen Therapy: Patient Spontanous Breathing and Patient connected to face mask oxygen  Post-op Assessment: Report given to RN and Post -op Vital signs reviewed and stable  Post vital signs: Reviewed and stable  Last Vitals:  Vitals:   04/10/17 0846 04/10/17 1404  BP: (!) 164/74 (!) 153/77  Pulse: (!) 58   Resp: (!) 21   Temp: 36.5 C 36.9 C  SpO2: 98%     Last Pain:  Vitals:   04/10/17 1013  TempSrc:   PainSc: 1       Patients Stated Pain Goal: 4 (74/14/23 9532)  Complications: No apparent anesthesia complications

## 2017-04-10 NOTE — Anesthesia Procedure Notes (Signed)
Procedure Name: LMA Insertion Date/Time: 04/10/2017 12:49 PM Performed by: Cynda Familia Pre-anesthesia Checklist: Patient identified, Emergency Drugs available, Suction available and Patient being monitored Patient Re-evaluated:Patient Re-evaluated prior to induction Oxygen Delivery Method: Circle System Utilized Preoxygenation: Pre-oxygenation with 100% oxygen Induction Type: IV induction Ventilation: Mask ventilation without difficulty LMA: LMA inserted LMA Size: 4.0 Tube type: Oral Number of attempts: 1 Placement Confirmation: positive ETCO2 Tube secured with: Tape Dental Injury: Teeth and Oropharynx as per pre-operative assessment  Comments: Smooth IV induction Jackson-- LMA AM CRNA atraumatic-- teeth and mouth as preop-- irregular surfaces front teeth

## 2017-04-10 NOTE — Anesthesia Postprocedure Evaluation (Signed)
Anesthesia Post Note  Patient: Darrell Perez  Procedure(s) Performed: CYSTOSCOPY/URETEROSCOPY/HOLMIUM LASER/STENT PLACEMENT (Left )     Patient location during evaluation: PACU Anesthesia Type: General Level of consciousness: awake and alert, patient cooperative and oriented Pain management: pain level controlled Vital Signs Assessment: post-procedure vital signs reviewed and stable Respiratory status: spontaneous breathing, nonlabored ventilation and respiratory function stable Cardiovascular status: blood pressure returned to baseline and stable Postop Assessment: no apparent nausea or vomiting Anesthetic complications: no    Last Vitals:  Vitals:   04/10/17 1439 04/10/17 1445  BP:  (!) 181/84  Pulse: (!) 50 (!) 52  Resp: 12 18  Temp:    SpO2: 93% 96%    Last Pain:  Vitals:   04/10/17 1013  TempSrc:   PainSc: 1                  Tildon Silveria,E. Klaryssa Fauth

## 2017-04-10 NOTE — Anesthesia Procedure Notes (Signed)
Date/Time: 04/10/2017 1:57 PM Performed by: Cynda Familia Pre-anesthesia Checklist: Suction available, Patient being monitored and Emergency Drugs available Oxygen Delivery Method: Simple face mask Placement Confirmation: positive ETCO2 and breath sounds checked- equal and bilateral Comments: Extubated to face mask--- good AW to PACU with O2 intact

## 2017-04-13 ENCOUNTER — Ambulatory Visit: Payer: Self-pay

## 2017-04-13 ENCOUNTER — Encounter (HOSPITAL_BASED_OUTPATIENT_CLINIC_OR_DEPARTMENT_OTHER): Payer: Self-pay | Admitting: Urology

## 2017-04-13 ENCOUNTER — Other Ambulatory Visit: Payer: Self-pay

## 2017-04-13 NOTE — Patient Outreach (Signed)
Transition of care: Placed call to patient who answered and identified himself.  Patient reports that surgery went well.  States that he has a new stent that will be removed tomorrow. Reports voiding every 45 minutes.  Reports some hematuria and states that he notified MD.  Reports otherwise doing well. No new problems or concerns today.  PLAN: Will continue weekly transition of care calls.  Tomasa Rand, RN, BSN, CEN [facility] [employer] [phone]

## 2017-04-16 ENCOUNTER — Ambulatory Visit (INDEPENDENT_AMBULATORY_CARE_PROVIDER_SITE_OTHER): Payer: PPO

## 2017-04-16 DIAGNOSIS — I480 Paroxysmal atrial fibrillation: Secondary | ICD-10-CM

## 2017-04-20 ENCOUNTER — Other Ambulatory Visit: Payer: Self-pay

## 2017-04-20 NOTE — Patient Outreach (Signed)
Transition of care: Placed call to patient who reports that he is doing well. Reports that stent was removed last week. Reports urinary frequency 3-4 times during the night. States he started wearing the heart monitor and the battery ran out. Reports charger would not work and a new monitor came in the mail today.  Patient reports decreased energy level.  Reports eating well.   Plan: reviewed signs and symptoms of a UTI and encouraged patient to call MD for any symptoms. Will continue weekly transition of care calls.  Tomasa Rand, RN, BSN, CEN [facility] [employer] [phone]

## 2017-04-22 ENCOUNTER — Other Ambulatory Visit: Payer: Self-pay | Admitting: *Deleted

## 2017-04-22 MED ORDER — SACCHAROMYCES BOULARDII 250 MG PO CAPS: 250.0000 mg | ORAL_CAPSULE | Freq: Two times a day (BID) | ORAL | 3 refills | Status: DC

## 2017-04-22 MED ORDER — DILTIAZEM HCL ER COATED BEADS 300 MG PO CP24: 300.0000 mg | ORAL_CAPSULE | Freq: Every morning | ORAL | 11 refills | Status: DC

## 2017-04-23 ENCOUNTER — Other Ambulatory Visit: Payer: Self-pay | Admitting: Cardiology

## 2017-04-23 MED ORDER — APIXABAN 5 MG PO TABS: 5.0000 mg | ORAL_TABLET | Freq: Two times a day (BID) | ORAL | 5 refills | Status: DC

## 2017-04-23 NOTE — Telephone Encounter (Signed)
apixaban (ELIQUIS) 5 MG TABS tablet 60 tablet 5 04/23/2017    Sig - Route: Take 1 tablet (5 mg total) by mouth 2 (two) times daily. - Oral   Sent to pharmacy as: apixaban (ELIQUIS) 5 MG Tab tablet   E-Prescribing Status: Receipt confirmed by pharmacy (04/23/2017 1:10 PM EDT)   Pharmacy   [id number] - [place], [place] - [address]   diltiazem (CARDIZEM CD) 300 MG 24 hr capsule 30 capsule 11 04/22/2017    Sig - Route: Take 1 capsule (300 mg total) by mouth every morning. - Oral   Sent to pharmacy as: diltiazem (CARDIZEM CD) 300 MG 24 hr capsule   E-Prescribing Status: Receipt confirmed by pharmacy (04/22/2017 5:08 PM EDT)   Pharmacy   [id number] - [place], [place] - [address]   saccharomyces boulardii (FLORASTOR) 250 MG capsule 20 capsule 3 04/22/2017    Sig - Route: Take 1 capsule (250 mg total) by mouth 2 (two) times daily. - Oral   Sent to pharmacy as: saccharomyces boulardii (FLORASTOR) 250 MG capsule   E-Prescribing Status: Receipt confirmed by pharmacy (04/22/2017 5:08 PM EDT)   Pharmacy   [id number] - [place], [place] - [address]   ** returned call to patient's wife - she needs refills on eliquis, diltiazem, florastor. Advised all meds were refilled 10/24 and 10/25 but florastor refills will need to come from PCP and/or GI doctor in future

## 2017-04-23 NOTE — Telephone Encounter (Signed)
Pt's wife [name] calling requesting a refill on Eliquis and Diltiazem. Wife would like a call back at [phone]. Wife stated that pt is almost out of his medications. Please address

## 2017-04-27 ENCOUNTER — Other Ambulatory Visit: Payer: Self-pay

## 2017-04-27 NOTE — Patient Outreach (Signed)
Transition of care call: Placed call to patient who answered and identified himself. Reports that he is doing well. Reports voiding okay. Still having some frequency at night about 2-3 each night. Reports no blood in urine. Reports eating well.  States no recent events of a racing heart.  Is currently wearing a 24 hour heart monitor. ( today is the last day) .  Reports no problems with medications.   PLAN: Will continue weekly transition of care calls.  Tomasa Rand, RN, BSN, CEN [facility] [employer] [phone]

## 2017-05-04 ENCOUNTER — Ambulatory Visit: Payer: Self-pay

## 2017-05-04 ENCOUNTER — Encounter: Payer: Self-pay | Admitting: Cardiology

## 2017-05-04 ENCOUNTER — Other Ambulatory Visit: Payer: Self-pay

## 2017-05-04 ENCOUNTER — Ambulatory Visit (INDEPENDENT_AMBULATORY_CARE_PROVIDER_SITE_OTHER): Payer: PPO | Admitting: Cardiology

## 2017-05-04 VITALS — BP 139/68 | HR 67 | Ht 69.0 in | Wt 206.4 lb

## 2017-05-04 DIAGNOSIS — I483 Typical atrial flutter: Secondary | ICD-10-CM | POA: Diagnosis not present

## 2017-05-04 DIAGNOSIS — Z7901 Long term (current) use of anticoagulants: Secondary | ICD-10-CM

## 2017-05-04 DIAGNOSIS — I10 Essential (primary) hypertension: Secondary | ICD-10-CM

## 2017-05-04 NOTE — Assessment & Plan Note (Signed)
CHADS VASc= 2. Pt placed on Eliquis-

## 2017-05-04 NOTE — Patient Outreach (Signed)
Transition of care: Placed call to patient. No answer. Left a message requesting a call back.  Plan: will continue outreach efforts.  Tomasa Rand, RN, BSN, CEN [facility] [employer] [phone]

## 2017-05-04 NOTE — Progress Notes (Signed)
05/04/2017 Darrell Perez   [birth year]  [id number]  Primary Physician Lavone Orn, MD Primary Cardiologist: Dr Percival Spanish  HPI:  76 y/o admitted 03/25/17 with urosepsis after ureteral stent placement. During his hospitalization he had an EKG 03/26/17 that indicated atrial flutter. He was placed on Eliquis and Diltiazem. He was discharged in NSR. Echo showed normal LVF with mild LAE. He was seen in the office 04/02/17. A 7 day Holter was done that did not show any arrhythmia. A carotid doppler showed only mild plaque. He denies any palpitations, syncope, or near syncope. He had his ureteral stent removed 04/10/17 without difficulty.      Current Outpatient Medications  Medication Sig Dispense Refill  . acetaminophen (TYLENOL) 500 MG tablet Take by mouth every 6 (six) hours as needed for mild pain, moderate pain, fever or headache.     . allopurinol (ZYLOPRIM) 100 MG tablet Take 100 mg by mouth every morning.     [name] apixaban (ELIQUIS) 5 MG TABS tablet Take 1 tablet (5 mg total) by mouth 2 (two) times daily. 60 tablet 5  . cholecalciferol (VITAMIN D) 1000 UNITS tablet Take 1,000 Units by mouth daily.    [name] diltiazem (CARDIZEM CD) 300 MG 24 hr capsule Take 1 capsule (300 mg total) by mouth every morning. 30 capsule 11  . HYDROcodone-acetaminophen (NORCO/VICODIN) 5-325 MG tablet Take 1 tablet by mouth every 6 (six) hours as needed for moderate pain.    . hyoscyamine (ANASPAZ) 0.125 MG TBDP disintergrating tablet Place 0.125 mg under the tongue every 6 (six) hours as needed for cramping.    . indapamide (LOZOL) 2.5 MG tablet Take 2.5 mg by mouth every evening.     . loperamide (IMODIUM) 2 MG capsule Take 1-2 mg by mouth as needed.    . loratadine (CLARITIN) 10 MG tablet Take 10 mg by mouth every morning.     . polyethylene glycol (MIRALAX / GLYCOLAX) packet Take 17 g by mouth daily. (Patient taking differently: Take 17 g by mouth daily as needed. ) 14 each 0  . potassium chloride SA  (K-DUR,KLOR-CON) 20 MEQ tablet Take 20 mEq by mouth 2 (two) times daily.    [name] saccharomyces boulardii (FLORASTOR) 250 MG capsule Take 1 capsule (250 mg total) by mouth 2 (two) times daily. 20 capsule 3  . vitamin B-12 (CYANOCOBALAMIN) 1000 MCG tablet Take 1,000 mcg by mouth every morning.      No current facility-administered medications for this visit.     Allergies  Allergen Reactions  . Naprosyn [Naproxen] Other (See Comments)    "makes me jittery/nervous"    Past Medical History:  Diagnosis Date  . Anticoagulated    eliquis  . Arthritis   . Frequency of urination   . GERD (gastroesophageal reflux disease)    watches and NO ISSUES WHEN USING CPAP--  NO MEDS  . Hiatal hernia   . History of asthma   . History of kidney stones   . History of multinodular goiter    s/p  thyroid lobectomy 1967  . Hypertension   . Irritable bowel syndrome   . Left knee DJD   . Left ureteral calculus   . Nephrolithiasis    bilateral non-obstrucive per CT 03-23-2017  . OSA on CPAP    cpap does not know settings   . Paroxysmal atrial fibrillation (HCC) newly dx 03-25-2017 (during hospitlization for urosepsis)   A-fib -- Aflutter w/ RVR -- pt started on cardizem and eliquis--- at discharge in  NSR/  cardiologist-  dr Percival Spanish  . Right carotid bruit    per cardiologist note 04-02-2017  asymptomatic --- duplex scheduled 04-07-2017  . Urgency of urination   . Wears glasses   . Wears hearing aid in both ears     Social History   Socioeconomic History  . Marital status: Married    Spouse name: Not on file  . Number of children: Not on file  . Years of education: Not on file  . Highest education level: Not on file  Social Needs  . Financial resource strain: Not on file  . Food insecurity - worry: Not on file  . Food insecurity - inability: Not on file  . Transportation needs - medical: Not on file  . Transportation needs - non-medical: Not on file  Occupational History  . Not on file    Tobacco Use  . Smoking status: Never Smoker  . Smokeless tobacco: Never Used  Substance and Sexual Activity  . Alcohol use: No  . Drug use: No  . Sexual activity: Not on file  Other Topics Concern  . Not on file  Social History Narrative  . Not on file     Family History  Problem Relation Age of Onset  . Dementia Mother   . Dementia Father   . Anesthesia problems Neg Hx   . Hypotension Neg Hx   . Malignant hyperthermia Neg Hx   . Pseudochol deficiency Neg Hx      Review of Systems: General: negative for chills, fever, night sweats or weight changes.  Cardiovascular: negative for chest pain, dyspnea on exertion, edema, orthopnea, palpitations, paroxysmal nocturnal dyspnea or shortness of breath Dermatological: negative for rash Respiratory: negative for cough or wheezing Urologic: negative for hematuria Abdominal: negative for nausea, vomiting, diarrhea, bright red blood per rectum, melena, or hematemesis Neurologic: negative for visual changes, syncope, or dizziness All other systems reviewed and are otherwise negative except as noted above.    Blood pressure 139/68, pulse 67, height 5\' 9"  (1.753 m), weight 206 lb 6.4 oz (93.6 kg).  General appearance: alert, cooperative, no distress and mildly obese Lungs: clear to auscultation bilaterally Heart: regular rate and rhythm Extremities: extremities normal, atraumatic, no cyanosis or edema Skin: Skin color, texture, turgor normal. No rashes or lesions Neurologic: Grossly normal  EKG NSR, SB 56  ASSESSMENT AND PLAN:   Atrial flutter (HCC) Atrial flutter (see EKG 03/26/17)  while hospitalized for urosepsis 9/25-9/28/18  The pt was discharged on Diltiazem and Eliquis  Anticoagulated CHADS VASc= 2. Pt placed on Eliquis-   Essential hypertension, benign Echo 03/25/17- normal LVF, mild LVH, mildly enlarged LA (43MM)   PLAN  Same Rx for now. F/U with Dr Percival Spanish in 3 months. I told the pt I would ask Dr Percival Spanish about  duration of Eliquis Rx.   [name] PA-C 05/04/2017 9:36 AM

## 2017-05-04 NOTE — Assessment & Plan Note (Signed)
Atrial flutter (see EKG 03/26/17)  while hospitalized for urosepsis 9/25-9/28/18  The pt was discharged on Diltiazem and Eliquis

## 2017-05-04 NOTE — Assessment & Plan Note (Signed)
Echo 03/25/17- normal LVF, mild LVH, mildly enlarged LA (43MM)

## 2017-05-04 NOTE — Patient Instructions (Addendum)
Medication Instructions:  Your physician recommends that you continue on your current medications as directed. Please refer to the Current Medication list given to you today.   Labwork: None ordered  Testing/Procedures: None ordered  Follow-Up: Your physician recommends that you schedule a follow-up appointment in: 3 MONTHS WITH DR. HOCHREIN   Any Other Special Instructions Will Be Listed Below (If Applicable).     If you need a refill on your cardiac medications before your next appointment, please call your pharmacy.   

## 2017-05-06 ENCOUNTER — Other Ambulatory Visit: Payer: Self-pay

## 2017-05-06 NOTE — Patient Outreach (Signed)
Transition of care call:  Incoming call from wife who was returning my call. Wife reports that patient continues to be weak.  States no reports for rapid heart beats. Reports urinary problems have resolved.  States no recent changes to medications. Has planned follow with primary MD.  PLAN: Reviewed with wife patient has completed 30 day transition of care program.  Reviewed that I would contact patient in 1 month for another follow up call and to call me sooner if needed. She agreed.  Tomasa Rand, RN, BSN, CEN [facility] [employer] [phone]

## 2017-05-13 DIAGNOSIS — I10 Essential (primary) hypertension: Secondary | ICD-10-CM | POA: Diagnosis not present

## 2017-05-13 DIAGNOSIS — Z6831 Body mass index (BMI) 31.0-31.9, adult: Secondary | ICD-10-CM | POA: Diagnosis not present

## 2017-05-20 DIAGNOSIS — N200 Calculus of kidney: Secondary | ICD-10-CM | POA: Diagnosis not present

## 2017-05-25 DIAGNOSIS — M545 Low back pain: Secondary | ICD-10-CM | POA: Diagnosis not present

## 2017-05-25 DIAGNOSIS — M48062 Spinal stenosis, lumbar region with neurogenic claudication: Secondary | ICD-10-CM | POA: Diagnosis not present

## 2017-05-25 DIAGNOSIS — M5136 Other intervertebral disc degeneration, lumbar region: Secondary | ICD-10-CM | POA: Diagnosis not present

## 2017-06-02 ENCOUNTER — Ambulatory Visit: Payer: Self-pay

## 2017-06-03 ENCOUNTER — Other Ambulatory Visit: Payer: Self-pay

## 2017-06-03 NOTE — Patient Outreach (Signed)
60 day follow up assessment:  Placed call to patient. No answer. Left a message requesting a call back.  PLAN: Will await a call back.  Tomasa Rand, RN, BSN, CEN [facility] [employer] [phone]

## 2017-06-04 ENCOUNTER — Other Ambulatory Visit: Payer: Self-pay

## 2017-06-04 NOTE — Patient Outreach (Signed)
60 day follow up: case closure:  Placed call to patient who identified himself.  Reports that he is doing well. Reports no more kidney stone problems. Reports no recent heart racing of concerns. States that he saw an orthopedist and was recommend to start an exercise program. Patient reports that he went to [facility] and checked out their exercise program. He is considering joining.   Denies any new concerns at this time.   Reviewed with patient any new goals and he has denied. Reviewed case closure and patient is in agreement.  PLAN: close case. Goals met. Will send MD letter and patient a case closure letter.  Tomasa Rand, RN, BSN, CEN [facility] [employer] [phone]

## 2017-06-11 DIAGNOSIS — Z85828 Personal history of other malignant neoplasm of skin: Secondary | ICD-10-CM | POA: Diagnosis not present

## 2017-06-11 DIAGNOSIS — L570 Actinic keratosis: Secondary | ICD-10-CM | POA: Diagnosis not present

## 2017-06-11 DIAGNOSIS — L821 Other seborrheic keratosis: Secondary | ICD-10-CM | POA: Diagnosis not present

## 2017-06-11 DIAGNOSIS — D224 Melanocytic nevi of scalp and neck: Secondary | ICD-10-CM | POA: Diagnosis not present

## 2017-06-11 DIAGNOSIS — D170 Benign lipomatous neoplasm of skin and subcutaneous tissue of head, face and neck: Secondary | ICD-10-CM | POA: Diagnosis not present

## 2017-07-07 DIAGNOSIS — N200 Calculus of kidney: Secondary | ICD-10-CM | POA: Diagnosis not present

## 2017-07-07 DIAGNOSIS — R3129 Other microscopic hematuria: Secondary | ICD-10-CM | POA: Diagnosis not present

## 2017-08-03 DIAGNOSIS — L300 Nummular dermatitis: Secondary | ICD-10-CM | POA: Diagnosis not present

## 2017-08-05 ENCOUNTER — Encounter: Payer: Self-pay | Admitting: Cardiology

## 2017-08-05 NOTE — Progress Notes (Signed)
Cardiology Office Note   Date:  08/06/2017   ID:  Darrell Perez, DOB Oct 20, 1940, MRN [id number]  PCP:  Lavone Orn, MD  Cardiologist:   No primary care provider on file.   Chief Complaint  Patient presents with  . Atrial Flutter      History of Present Illness: Darrell Perez is a 77 y.o. male who presents for follow up of atrial flutter.   The patient had atrial flutter during treatment with sepsis secondary to UTI.  He was treated with Eliquis and was Dilt.  He was discharged in NSR.  He did wear a monitor and had no evidence of atrial flutter.    Since then he has had no palpitations.  The patient denies any new symptoms such as chest discomfort, neck or arm discomfort. There has been no new shortness of breath, PND or orthopnea. There have been no reported presyncope or syncope.  He is tired but tries to go to the gym 2 x per week.  With this he has no symptoms.   Past Medical History:  Diagnosis Date  . Arthritis   . GERD (gastroesophageal reflux disease)    watches and NO ISSUES WHEN USING CPAP--  NO MEDS  . Hiatal hernia   . History of asthma   . History of kidney stones   . History of multinodular goiter    s/p  thyroid lobectomy 1967  . Hypertension   . Irritable bowel syndrome   . Left knee DJD   . Left ureteral calculus   . Nephrolithiasis    bilateral non-obstrucive per CT 03-23-2017  . OSA on CPAP    cpap does not know settings   . Paroxysmal atrial fibrillation (HCC) newly dx 03-25-2017 (during hospitlization for urosepsis)   A-fib -- Aflutter w/ RVR -- pt started on cardizem and eliquis--- at discharge in NSR/  cardiologist-  dr Percival Spanish    Past Surgical History:  Procedure Laterality Date  . CARPAL TUNNEL RELEASE Bilateral [place]   . CATARACT EXTRACTION W/ INTRAOCULAR LENS  IMPLANT, BILATERAL  2005  &  2012  . CYSTOSCOPY WITH STENT PLACEMENT Left 03/23/2017   Procedure: CYSTOSCOPY, LEFT RETROGRADE WITH STENT PLACEMENT;  Surgeon: Lucas Mallow, MD;  Location: [facility];  Service: Urology;  Laterality: Left;  . CYSTOSCOPY/URETEROSCOPY/HOLMIUM LASER/STENT PLACEMENT Left 04/10/2017   Procedure: CYSTOSCOPY/URETEROSCOPY/HOLMIUM LASER/STENT PLACEMENT;  Surgeon: Festus Aloe, MD;  Location: [facility];  Service: Urology;  Laterality: Left;  . EXTRACORPOREAL SHOCK WAVE LITHOTRIPSY     MULTIPLE  . HEMIARTHROPLASTY RIGHT SHOULDER  2007  . HOLMIUM LASER APPLICATION Left 7/98/9211   Procedure: HOLMIUM LASER APPLICATION;  Surgeon: Fredricka Bonine, MD;  Location: [facility];  Service: Urology;  Laterality: Left;  . [place]   removal of stones. (OPEN PROCUDURE)  . REVERSE SHOULDER ARTHROPLASTY  10/09/2011   Procedure: REVERSE SHOULDER ARTHROPLASTY;  Surgeon: Marin Shutter, MD;  Location: [facility];  Service: Orthopedics;  Laterality: Right;  right hardware removal and reverse shoulder arthroplasty  . THYROID LOBECTOMY  1967   goiter removed  . TONSILLECTOMY    . TOTAL KNEE ARTHROPLASTY  07/22/2012   Procedure: TOTAL KNEE ARTHROPLASTY;  Surgeon: Johnn Hai, MD;  Location: [facility];  Service: Orthopedics;  Laterality: Right;  . TOTAL KNEE ARTHROPLASTY Left 12/16/2012   Procedure: LEFT TOTAL KNEE ARTHROPLASTY;  Surgeon: Johnn Hai, MD;  Location: [facility];  Service: Orthopedics;  Laterality: Left;  . TRANSTHORACIC ECHOCARDIOGRAM  03/25/2017   mild LVH, ef 55-60%/  mild LAE/ trivial PR/  mild MR  . TRIGGER FINGER RELEASE Right 2006  . URETEROSOPIC STONE EXTRACTION  2002     Current Outpatient Medications  Medication Sig Dispense Refill  . acetaminophen (TYLENOL) 500 MG tablet Take by mouth every 6 (six) hours as needed for mild pain, moderate pain, fever or headache.     . allopurinol (ZYLOPRIM) 100 MG tablet Take 100 mg by mouth every morning.     . cholecalciferol (VITAMIN D) 1000 UNITS tablet Take 1,000 Units by mouth daily.    [name] diltiazem (CARDIZEM CD) 240 MG 24 hr  capsule Take 1 capsule (240 mg total) by mouth every morning. 90 capsule 3  . galantamine (RAZADYNE) 4 MG tablet Take 2 tablets by mouth daily.    [name] HYDROcodone-acetaminophen (NORCO/VICODIN) 5-325 MG tablet Take 1 tablet by mouth every 6 (six) hours as needed for moderate pain.    . hyoscyamine (ANASPAZ) 0.125 MG TBDP disintergrating tablet Place 0.125 mg under the tongue every 6 (six) hours as needed for cramping.    . indapamide (LOZOL) 2.5 MG tablet Take 2.5 mg by mouth every evening.     . loperamide (IMODIUM) 2 MG capsule Take 1-2 mg by mouth as needed.    . loratadine (CLARITIN) 10 MG tablet Take 10 mg by mouth every morning.     . pantoprazole (PROTONIX) 40 MG tablet Take 1 tablet by mouth daily.    . polyethylene glycol (MIRALAX / GLYCOLAX) packet Take 17 g by mouth daily. (Patient taking differently: Take 17 g by mouth daily as needed. ) 14 each 0  . potassium chloride SA (K-DUR,KLOR-CON) 20 MEQ tablet Take 20 mEq by mouth 2 (two) times daily.    [name] saccharomyces boulardii (FLORASTOR) 250 MG capsule Take 1 capsule (250 mg total) by mouth 2 (two) times daily. 20 capsule 3  . vitamin B-12 (CYANOCOBALAMIN) 1000 MCG tablet Take 1,000 mcg by mouth every morning.      No current facility-administered medications for this visit.     Allergies:   Naprosyn [naproxen]    ROS:  Please see the history of present illness.   Otherwise, review of systems are positive for none.   All other systems are reviewed and negative.    PHYSICAL EXAM: VS:  BP 136/78   Pulse 60   Ht 5\' 9"  (1.753 m)   Wt 202 lb 6.4 oz (91.8 kg)   BMI 29.89 kg/m  , BMI Body mass index is 29.89 kg/m. GENERAL:  Well appearing NECK:  No jugular venous distention, waveform within normal limits, carotid upstroke brisk and symmetric, no bruits, no thyromegaly LUNGS:  Clear to auscultation bilaterally CHEST:  Unremarkable HEART:  PMI not displaced or sustained,S1 and S2 within normal limits, no S3, no S4, no clicks, no rubs,  no murmurs ABD:  Flat, positive bowel sounds normal in frequency in pitch, no bruits, no rebound, no guarding, no midline pulsatile mass, no hepatomegaly, no splenomegaly EXT:  2 plus pulses throughout, no edema, no cyanosis no clubbing    EKG:  EKG is not ordered today.   Recent Labs: 03/25/2017: TSH 0.439 03/26/2017: ALT 22; Magnesium 1.7 03/27/2017: BUN 22; Creatinine, Ser 1.02; Hemoglobin 13.9; Platelets 190; Potassium 3.7; Sodium 138    Lipid Panel No results found for: CHOL, TRIG, HDL, CHOLHDL, VLDL, LDLCALC, LDLDIRECT    Wt Readings from Last 3 Encounters:  08/06/17  202 lb 6.4 oz (91.8 kg)  05/04/17 206 lb 6.4 oz (93.6 kg)  04/10/17 200 lb 8 oz (90.9 kg)      Other studies Reviewed: Additional studies/ records that were reviewed today include: Hospital records. Review of the above records demonstrates:  Please see elsewhere in the note.     ASSESSMENT AND PLAN:   ATRIAL FLUTTER:    Mr. KASON BENAK has a CHA2DS2 - VASc score of 2.   I did review the hospital records and the monitor.  He had atrial flutter only when he was acutely ill.  He would prefer to be off of the blood thinner and I think this is a reasonable choice and we discussed the risk benefit ratio.  He will stop the Eliquis.  In addition I will reduce the Cardizem to see if that helps with his fatigue.    HTN:  The blood pressure is at target. No change in medications is indicated. We will continue with therapeutic lifestyle changes (TLC).  He will watch this on the reduced Cardizem.    Current medicines are reviewed at length with the patient today.  The patient does not have concerns regarding medicines.  The following changes have been made:  As above.   Labs/ tests ordered today include: None No orders of the defined types were placed in this encounter.    Disposition:   FU with [name] PAC in 6 months or sooner if he has palpitations.      Signed, Minus Breeding, MD  08/06/2017 11:42 AM     [facility]

## 2017-08-06 ENCOUNTER — Ambulatory Visit: Payer: Medicare HMO | Admitting: Cardiology

## 2017-08-06 ENCOUNTER — Encounter: Payer: Self-pay | Admitting: Cardiology

## 2017-08-06 ENCOUNTER — Ambulatory Visit: Payer: PPO | Admitting: Cardiology

## 2017-08-06 VITALS — BP 136/78 | HR 60 | Ht 69.0 in | Wt 202.4 lb

## 2017-08-06 DIAGNOSIS — I10 Essential (primary) hypertension: Secondary | ICD-10-CM | POA: Diagnosis not present

## 2017-08-06 DIAGNOSIS — I4892 Unspecified atrial flutter: Secondary | ICD-10-CM

## 2017-08-06 MED ORDER — DILTIAZEM HCL ER COATED BEADS 240 MG PO CP24: 240.0000 mg | ORAL_CAPSULE | Freq: Every morning | ORAL | 3 refills | Status: DC

## 2017-08-06 NOTE — Patient Instructions (Signed)
Medication Instructions:  STOP- Eliquis DECREASE- Cardizem 240 mg daily  If you need a refill on your cardiac medications before your next appointment, please call your pharmacy.  Labwork: None Ordered   Testing/Procedures: None Ordered  Follow-Up: Your physician wants you to follow-up in: 6 Months. You should receive a reminder letter in the mail two months in advance. If you do not receive a letter, please call our office [phone].    Thank you for choosing [facility] at [facility]!!

## 2017-08-26 DIAGNOSIS — N401 Enlarged prostate with lower urinary tract symptoms: Secondary | ICD-10-CM | POA: Diagnosis not present

## 2017-08-26 DIAGNOSIS — N200 Calculus of kidney: Secondary | ICD-10-CM | POA: Diagnosis not present

## 2017-08-26 DIAGNOSIS — R350 Frequency of micturition: Secondary | ICD-10-CM | POA: Diagnosis not present

## 2017-09-24 DIAGNOSIS — L821 Other seborrheic keratosis: Secondary | ICD-10-CM | POA: Diagnosis not present

## 2017-09-24 DIAGNOSIS — L300 Nummular dermatitis: Secondary | ICD-10-CM | POA: Diagnosis not present

## 2017-10-01 DIAGNOSIS — M79674 Pain in right toe(s): Secondary | ICD-10-CM | POA: Diagnosis not present

## 2017-10-22 DIAGNOSIS — K219 Gastro-esophageal reflux disease without esophagitis: Secondary | ICD-10-CM | POA: Diagnosis not present

## 2017-10-22 DIAGNOSIS — I480 Paroxysmal atrial fibrillation: Secondary | ICD-10-CM | POA: Diagnosis not present

## 2017-10-22 DIAGNOSIS — Z0000 Encounter for general adult medical examination without abnormal findings: Secondary | ICD-10-CM | POA: Diagnosis not present

## 2017-10-22 DIAGNOSIS — Z1389 Encounter for screening for other disorder: Secondary | ICD-10-CM | POA: Diagnosis not present

## 2017-10-22 DIAGNOSIS — F0390 Unspecified dementia without behavioral disturbance: Secondary | ICD-10-CM | POA: Diagnosis not present

## 2017-10-22 DIAGNOSIS — I10 Essential (primary) hypertension: Secondary | ICD-10-CM | POA: Diagnosis not present

## 2017-10-22 DIAGNOSIS — G4733 Obstructive sleep apnea (adult) (pediatric): Secondary | ICD-10-CM | POA: Diagnosis not present

## 2017-10-22 DIAGNOSIS — M10072 Idiopathic gout, left ankle and foot: Secondary | ICD-10-CM | POA: Diagnosis not present

## 2017-10-23 ENCOUNTER — Encounter: Payer: Self-pay | Admitting: Neurology

## 2017-11-05 DIAGNOSIS — R229 Localized swelling, mass and lump, unspecified: Secondary | ICD-10-CM | POA: Diagnosis not present

## 2017-11-05 DIAGNOSIS — W57XXXA Bitten or stung by nonvenomous insect and other nonvenomous arthropods, initial encounter: Secondary | ICD-10-CM | POA: Diagnosis not present

## 2018-01-05 ENCOUNTER — Other Ambulatory Visit: Payer: Self-pay | Admitting: Podiatry

## 2018-01-05 ENCOUNTER — Encounter: Payer: Self-pay | Admitting: Podiatry

## 2018-01-05 ENCOUNTER — Ambulatory Visit: Payer: Medicare HMO | Admitting: Podiatry

## 2018-01-05 ENCOUNTER — Ambulatory Visit (INDEPENDENT_AMBULATORY_CARE_PROVIDER_SITE_OTHER): Payer: Medicare HMO

## 2018-01-05 DIAGNOSIS — M79671 Pain in right foot: Secondary | ICD-10-CM | POA: Diagnosis not present

## 2018-01-05 DIAGNOSIS — M79672 Pain in left foot: Principal | ICD-10-CM

## 2018-01-05 DIAGNOSIS — M779 Enthesopathy, unspecified: Secondary | ICD-10-CM

## 2018-01-05 MED ORDER — TRIAMCINOLONE ACETONIDE 10 MG/ML IJ SUSP
10.0000 mg | Freq: Once | INTRAMUSCULAR | Status: AC
  Administered 2018-01-05: 10 mg

## 2018-01-06 DIAGNOSIS — H5201 Hypermetropia, right eye: Secondary | ICD-10-CM | POA: Diagnosis not present

## 2018-01-06 NOTE — Progress Notes (Signed)
Subjective:   Patient ID: Darrell Perez, male   DOB: 77 y.o.   MRN: [id number]   HPI Patient presents with a lot of pain in the outside of the left foot and states is been hurting for several months and he does not recommend or remember specific injury.   Review of Systems  All other systems reviewed and are negative.       Objective:  Physical Exam  Constitutional: He appears well-developed and well-nourished.  Cardiovascular: Intact distal pulses.  Pulmonary/Chest: Effort normal.  Musculoskeletal: Normal range of motion.  Neurological: He is alert.  Skin: Skin is warm.  Nursing note and vitals reviewed.   Neurovascular status intact muscle strength was found to be adequate with patient noted to have exquisite discomfort in the lateral side of the left peroneal insertion into the base of the fifth metatarsal with fluid buildup.  Patient is noted to have pain when he tries to walk and it seems to be worse with aggressive activity       Assessment:  Probability for peroneal tendon inflammation left with no indication of tendon damage or bone damage currently     Plan:  H&P x-ray reviewed and careful sheath injection administered left 3 mg Kenalog 5 mg Xylocaine and advised this patient on brace usage over the lateral side of the foot with a fascial brace dispensed.  Advised on aggressive ice therapy and reappoint to recheck  X-ray indicates that there is some slight reactive change base of fifth metatarsal but it is localized

## 2018-01-15 ENCOUNTER — Ambulatory Visit: Payer: Medicare HMO | Admitting: Neurology

## 2018-01-15 ENCOUNTER — Other Ambulatory Visit: Payer: Self-pay

## 2018-01-15 ENCOUNTER — Encounter: Payer: Self-pay | Admitting: Neurology

## 2018-01-15 VITALS — BP 134/62 | HR 49 | Ht 69.0 in | Wt 203.0 lb

## 2018-01-15 DIAGNOSIS — F0390 Unspecified dementia without behavioral disturbance: Secondary | ICD-10-CM | POA: Diagnosis not present

## 2018-01-15 DIAGNOSIS — F03A0 Unspecified dementia, mild, without behavioral disturbance, psychotic disturbance, mood disturbance, and anxiety: Secondary | ICD-10-CM

## 2018-01-15 MED ORDER — MEMANTINE HCL 10 MG PO TABS: 10.0000 mg | ORAL_TABLET | Freq: Two times a day (BID) | ORAL | 1 refills | Status: DC

## 2018-01-15 NOTE — Patient Instructions (Signed)
1. Stop Galantamine 2. Start Memantine 10mg : take 1 tablet twice a day. If no side effects, call our office and we will send in a 90-day prescription to [facility] 3. Follow-up in 6 months, call for any changes  FALL PRECAUTIONS: Be cautious when walking. Scan the area for obstacles that may increase the risk of trips and falls. When getting up in the mornings, sit up at the edge of the bed for a few minutes before getting out of bed. Consider elevating the bed at the head end to avoid drop of blood pressure when getting up. Walk always in a well-lit room (use night lights in the walls). Avoid area rugs or power cords from appliances in the middle of the walkways. Use a walker or a cane if necessary and consider physical therapy for balance exercise. Get your eyesight checked regularly.  FINANCIAL OVERSIGHT: Supervision, especially oversight when making financial decisions or transactions is also recommended.  HOME SAFETY: Consider the safety of the kitchen when operating appliances like stoves, microwave oven, and blender. Consider having supervision and share cooking responsibilities until no longer able to participate in those. Accidents with firearms and other hazards in the house should be identified and addressed as well.  DRIVING: Regarding driving, in patients with progressive memory problems, driving will be impaired. We advise to have someone else do the driving if trouble finding directions or if minor accidents are reported. Independent driving assessment is available to determine safety of driving.  ABILITY TO BE LEFT ALONE: If patient is unable to contact 911 operator, consider using LifeLine, or when the need is there, arrange for someone to stay with patients. Smoking is a fire hazard, consider supervision or cessation. Risk of wandering should be assessed by caregiver and if detected at any point, supervision and safe proof recommendations should be instituted.  MEDICATION SUPERVISION:  Inability to self-administer medication needs to be constantly addressed. Implement a mechanism to ensure safe administration of the medications.  RECOMMENDATIONS FOR ALL PATIENTS WITH MEMORY PROBLEMS: 1. Continue to exercise (Recommend 30 minutes of walking everyday, or 3 hours every week) 2. Increase social interactions - continue going to [place] and enjoy social gatherings with friends and family 3. Eat healthy, avoid fried foods and eat more fruits and vegetables 4. Maintain adequate blood pressure, blood sugar, and blood cholesterol level. Reducing the risk of stroke and cardiovascular disease also helps promoting better memory. 5. Avoid stressful situations. Live a simple life and avoid aggravations. Organize your time and prepare for the next day in anticipation. 6. Sleep well, avoid any interruptions of sleep and avoid any distractions in the bedroom that may interfere with adequate sleep quality 7. Avoid sugar, avoid sweets as there is a strong link between excessive sugar intake, diabetes, and cognitive impairment We discussed the Mediterranean diet, which has been shown to help patients reduce the risk of progressive memory disorders and reduces cardiovascular risk. This includes eating fish, eat fruits and green leafy vegetables, nuts like almonds and hazelnuts, walnuts, and also use olive oil. Avoid fast foods and fried foods as much as possible. Avoid sweets and sugar as sugar use has been linked to worsening of memory function.  There is always a concern of gradual progression of memory problems. If this is the case, then we may need to adjust level of care according to patient needs. Support, both to the patient and caregiver, should then be put into place.

## 2018-01-15 NOTE — Progress Notes (Signed)
NEUROLOGY CONSULTATION NOTE  Darrell Perez MRN: [id number] DOB: [birth year]  Referring provider: Dr. Lavone Orn Primary care provider: Dr. Lavone Orn  Reason for consult:  dementia  Dear Dr Laurann Montana:  Thank you for your kind referral of Darrell Perez for consultation of the above symptoms. Although his history is well known to you, please allow me to reiterate it for the purpose of our medical record. The patient was accompanied to the clinic by his wife who also provides collateral information. Records and images were personally reviewed where available.  HISTORY OF PRESENT ILLNESS: This is a pleasant 77 year old right-handed man with a history of hypertension, goiter s/p thyroid lobectomy, atrial fibrillation, dementia, presenting to establish care. He feels his memory is pretty good. His wife started noticing memory changes around 5 years ago where he was getting more forgetful, but worse in the past 1-2 years. He had sepsis in the Fall and things seemed to accelerate then. He had more difficulties initially when he got home, but has acclimated since then. His wife has taken over most of the driving after he made her nervous trying to turn left onto oncoming traffic 1.5 years ago and did not seem to comprehend this was difficult. He continues to manage his own medications. There have been a few bills that he has let go too long without paying, which is unusual. He has word-finding difficulties. His wife denies any personality changes, no paranoia or hallucinations. He had abnormal dreams on Aricept and is taking Galantamine, however this is quite costly for their budget and his wife wonders if there is any true benefit from it. No side effects on galantamine.  He has low back pain and both legs feel weak. Otherwise he denies any headaches, dizziness, vision changes, dysarthria/dysphagia, neck pain, focal numbness/tingling, bowel/bladder dysfunction, anosmia, or tremors. Sleep is good. No  falls. Both parents had dementia. He denies any history of significant head injuries or alcohol use.   I personally reviewed MRI brain with and without contrast done 10/2015 which did not show any acute changes. There was mild dilatation of the lateral ventricles with sparing of the temporal horns, mild diffuse atrophy and chronic microvascular disease.  Laboratory Data: TSH 03/2017 normal 1.13  PAST MEDICAL HISTORY: Past Medical History:  Diagnosis Date  . Arthritis   . GERD (gastroesophageal reflux disease)    watches and NO ISSUES WHEN USING CPAP--  NO MEDS  . Hiatal hernia   . History of asthma   . History of kidney stones   . History of multinodular goiter    s/p  thyroid lobectomy 1967  . Hypertension   . Irritable bowel syndrome   . Left knee DJD   . Left ureteral calculus   . Nephrolithiasis    bilateral non-obstrucive per CT 03-23-2017  . OSA on CPAP    cpap does not know settings   . Paroxysmal atrial fibrillation (HCC) newly dx 03-25-2017 (during hospitlization for urosepsis)   A-fib -- Aflutter w/ RVR -- pt started on cardizem and eliquis--- at discharge in NSR/  cardiologist-  dr hochrein    PAST SURGICAL HISTORY: Past Surgical History:  Procedure Laterality Date  . CARPAL TUNNEL RELEASE Bilateral [place]   . CATARACT EXTRACTION W/ INTRAOCULAR LENS  IMPLANT, BILATERAL  2005  &  2012  . CYSTOSCOPY WITH STENT PLACEMENT Left 03/23/2017   Procedure: CYSTOSCOPY, LEFT RETROGRADE WITH STENT PLACEMENT;  Surgeon: Lucas Mallow, MD;  Location:  [facility];  Service: Urology;  Laterality: Left;  . CYSTOSCOPY/URETEROSCOPY/HOLMIUM LASER/STENT PLACEMENT Left 04/10/2017   Procedure: CYSTOSCOPY/URETEROSCOPY/HOLMIUM LASER/STENT PLACEMENT;  Surgeon: Festus Aloe, MD;  Location[facility];  Service: Urology;  Laterality: Left;  . EXTRACORPOREAL SHOCK WAVE LITHOTRIPSY     MULTIPLE  . HEMIARTHROPLASTY RIGHT SHOULDER  2007  . HOLMIUM LASER APPLICATION Left  4/74/2595   Procedure: HOLMIUM LASER APPLICATION;  Surgeon: Fredricka Bonine, MD;  Location: [facility];  Service: Urology;  Laterality: Left;  . [place]   removal of stones. (OPEN PROCUDURE)  . REVERSE SHOULDER ARTHROPLASTY  10/09/2011   Procedure: REVERSE SHOULDER ARTHROPLASTY;  Surgeon: Marin Shutter, MD;  Location: [place];  Service: Orthopedics;  Laterality: Right;  right hardware removal and reverse shoulder arthroplasty  . THYROID LOBECTOMY  1967   goiter removed  . TONSILLECTOMY    . TOTAL KNEE ARTHROPLASTY  07/22/2012   Procedure: TOTAL KNEE ARTHROPLASTY;  Surgeon: Johnn Hai, MD;  Location: [facility];  Service: Orthopedics;  Laterality: Right;  . TOTAL KNEE ARTHROPLASTY Left 12/16/2012   Procedure: LEFT TOTAL KNEE ARTHROPLASTY;  Surgeon: Johnn Hai, MD;  Location: [facility];  Service: Orthopedics;  Laterality: Left;  . TRANSTHORACIC ECHOCARDIOGRAM  03/25/2017   mild LVH, ef 55-60%/  mild LAE/ trivial PR/  mild MR  . TRIGGER FINGER RELEASE Right 2006  . URETEROSOPIC STONE EXTRACTION  2002    MEDICATIONS: Current Outpatient Medications on File Prior to Visit  Medication Sig Dispense Refill  . acetaminophen (TYLENOL) 500 MG tablet Take by mouth every 6 (six) hours as needed for mild pain, moderate pain, fever or headache.     . allopurinol (ZYLOPRIM) 100 MG tablet Take 100 mg by mouth every morning.     . cholecalciferol (VITAMIN D) 1000 UNITS tablet Take 1,000 Units by mouth daily.    [name] diltiazem (CARDIZEM CD) 240 MG 24 hr capsule Take 1 capsule (240 mg total) by mouth every morning. 90 capsule 3  . galantamine (RAZADYNE) 4 MG tablet Take 2 tablets by mouth daily.    [name] HYDROcodone-acetaminophen (NORCO/VICODIN) 5-325 MG tablet Take 1 tablet by mouth every 6 (six) hours as needed for moderate pain.    . hyoscyamine (ANASPAZ) 0.125 MG TBDP disintergrating tablet Place 0.125 mg under the tongue every 6 (six) hours as needed for  cramping.    . indapamide (LOZOL) 2.5 MG tablet Take 2.5 mg by mouth every evening.     . loperamide (IMODIUM) 2 MG capsule Take 1-2 mg by mouth as needed.    . loratadine (CLARITIN) 10 MG tablet Take 10 mg by mouth every morning.     . pantoprazole (PROTONIX) 40 MG tablet Take 1 tablet by mouth daily.    . polyethylene glycol (MIRALAX / GLYCOLAX) packet Take 17 g by mouth daily. (Patient taking differently: Take 17 g by mouth daily as needed. ) 14 each 0  . potassium chloride SA (K-DUR,KLOR-CON) 20 MEQ tablet Take 20 mEq by mouth 2 (two) times daily.    [name] saccharomyces boulardii (FLORASTOR) 250 MG capsule Take 1 capsule (250 mg total) by mouth 2 (two) times daily. 20 capsule 3  . vitamin B-12 (CYANOCOBALAMIN) 1000 MCG tablet Take 1,000 mcg by mouth every morning.      No current facility-administered medications on file prior to visit.     ALLERGIES: Allergies  Allergen Reactions  . Naprosyn [Naproxen] Other (See Comments)    "makes me  jittery/nervous"    FAMILY HISTORY: Family History  Problem Relation Age of Onset  . Dementia Mother   . Dementia Father   . Anesthesia problems Neg Hx   . Hypotension Neg Hx   . Malignant hyperthermia Neg Hx   . Pseudochol deficiency Neg Hx     SOCIAL HISTORY: Social History   Socioeconomic History  . Marital status: Married    Spouse name: Not on file  . Number of children: Not on file  . Years of education: Not on file  . Highest education level: Not on file  Occupational History  . Not on file  Social Needs  . Financial resource strain: Not on file  . Food insecurity:    Worry: Not on file    Inability: Not on file  . Transportation needs:    Medical: Not on file    Non-medical: Not on file  Tobacco Use  . Smoking status: Never Smoker  . Smokeless tobacco: Never Used  Substance and Sexual Activity  . Alcohol use: No  . Drug use: No  . Sexual activity: Not on file  Lifestyle  . Physical activity:    Days per week: Not on  file    Minutes per session: Not on file  . Stress: Not on file  Relationships  . Social connections:    Talks on phone: Not on file    Gets together: Not on file    Attends religious service: Not on file    Active member of club or organization: Not on file    Attends meetings of clubs or organizations: Not on file    Relationship status: Not on file  . Intimate partner violence:    Fear of current or ex partner: Not on file    Emotionally abused: Not on file    Physically abused: Not on file    Forced sexual activity: Not on file  Other Topics Concern  . Not on file  Social History Narrative  . Not on file    REVIEW OF SYSTEMS: Constitutional: No fevers, chills, or sweats, no generalized fatigue, change in appetite Eyes: No visual changes, double vision, eye pain Ear, nose and throat: No hearing loss, ear pain, nasal congestion, sore throat Cardiovascular: No chest pain, palpitations Respiratory:  No shortness of breath at rest or with exertion, wheezes GastrointestinaI: No nausea, vomiting, diarrhea, abdominal pain, fecal incontinence Genitourinary:  No dysuria, urinary retention or frequency Musculoskeletal:  No neck pain,+ back pain Integumentary: No rash, pruritus, skin lesions Neurological: as above Psychiatric: No depression, insomnia, anxiety Endocrine: No palpitations, fatigue, diaphoresis, mood swings, change in appetite, change in weight, increased thirst Hematologic/Lymphatic:  No anemia, purpura, petechiae. Allergic/Immunologic: no itchy/runny eyes, nasal congestion, recent allergic reactions, rashes  PHYSICAL EXAM: Vitals:   01/15/18 0852  BP: 134/62  Pulse: (!) 49  SpO2: 97%   General: No acute distress Head:  Normocephalic/atraumatic Eyes: Fundoscopic exam shows bilateral sharp discs, no vessel changes, exudates, or hemorrhages Neck: supple, no paraspinal tenderness, full range of motion Back: No paraspinal tenderness Heart: regular rate and  rhythm Lungs: Clear to auscultation bilaterally. Vascular: No carotid bruits. Skin/Extremities: No rash, no edema Neurological Exam: Mental status: alert and oriented to person, place, and time, no dysarthria or aphasia, Fund of knowledge is appropriate.  Recent and remote memory are intact.  Attention and concentration are normal.    Able to name objects and repeat phrases. CDT 5/5 MMSE - Mini Mental State Exam 01/22/2018  Orientation to  time 5  Orientation to Place 5  Registration 3  Attention/ Calculation 4  Recall 2  Language- name 2 objects 2  Language- repeat 1  Language- follow 3 step command 3  Language- read & follow direction 1  Write a sentence 1  Copy design 1  Total score 28   Cranial nerves: CN I: not tested CN II: pupils equal, round and reactive to light, visual fields intact, fundi unremarkable. CN III, IV, VI:  full range of motion, no nystagmus, no ptosis CN V: facial sensation intact CN VII: upper and lower face symmetric CN VIII: hearing intact to finger rub CN IX, X: gag intact, uvula midline CN XI: sternocleidomastoid and trapezius muscles intact CN XII: tongue midline Bulk & Tone: normal, no fasciculations. Motor: 5/5 throughout with no pronator drift. Sensation: intact to light touch, cold, pin, vibration and joint position sense.  No extinction to double simultaneous stimulation.  Romberg test negative Deep Tendon Reflexes: +1 throughout, no ankle clonus Plantar responses: downgoing bilaterally Cerebellar: no incoordination on finger to nose, heel to shin. No dysdiadochokinesia Gait: narrow-based and steady, able to tandem walk adequately. Tremor: none  IMPRESSION: This is a pleasant 77 year old right-handed man with a history of  hypertension, goiter s/p thyroid lobectomy, atrial fibrillation, dementia, presenting to establish care. Memory changes started around 5 years ago. MMSE today 28/30, neurological exam normal. His wife has noticed more  difficulties with driving and bill upkeep, indicating more difficulties with complex tasks. We discussed the diagnosis of mild dementia, likely due to Alzheimer's disease. His MRI brain in 2017 had shown mildly dilated ventricles, he does not have any other clinical symptoms concerning for NPH. He had side effects on Donepezil, and now galantamine is cost-prohibitive. We discussed expectations from medications for dementia, we will start Memantine 10mg  BID, side effects discussed. They will call our office to let us know if this would be cost-prohibitive as well. We discussed the importance of control of vascular risk factors, physical exercise, and brain stimulation exercises. Follow-up in 6 months, they know to call for any changes.   Thank you for allowing me to participate in the care of this patient. Please do not hesitate to call for any questions or concerns.   Darrell Perez, M.D.  CC: Dr. Laurann Montana

## 2018-01-21 DIAGNOSIS — Z96651 Presence of right artificial knee joint: Secondary | ICD-10-CM | POA: Diagnosis not present

## 2018-01-21 DIAGNOSIS — M545 Low back pain: Secondary | ICD-10-CM | POA: Diagnosis not present

## 2018-01-21 DIAGNOSIS — M25561 Pain in right knee: Secondary | ICD-10-CM | POA: Diagnosis not present

## 2018-01-21 DIAGNOSIS — N200 Calculus of kidney: Secondary | ICD-10-CM | POA: Diagnosis not present

## 2018-01-21 DIAGNOSIS — M1711 Unilateral primary osteoarthritis, right knee: Secondary | ICD-10-CM | POA: Diagnosis not present

## 2018-01-22 ENCOUNTER — Encounter: Payer: Self-pay | Admitting: Neurology

## 2018-01-22 DIAGNOSIS — F03A0 Unspecified dementia, mild, without behavioral disturbance, psychotic disturbance, mood disturbance, and anxiety: Secondary | ICD-10-CM | POA: Insufficient documentation

## 2018-01-22 DIAGNOSIS — F0390 Unspecified dementia without behavioral disturbance: Secondary | ICD-10-CM | POA: Insufficient documentation

## 2018-02-12 DIAGNOSIS — K210 Gastro-esophageal reflux disease with esophagitis: Secondary | ICD-10-CM | POA: Diagnosis not present

## 2018-02-12 DIAGNOSIS — T17908A Unspecified foreign body in respiratory tract, part unspecified causing other injury, initial encounter: Secondary | ICD-10-CM | POA: Diagnosis not present

## 2018-02-22 DIAGNOSIS — D0421 Carcinoma in situ of skin of right ear and external auricular canal: Secondary | ICD-10-CM | POA: Diagnosis not present

## 2018-02-22 DIAGNOSIS — D485 Neoplasm of uncertain behavior of skin: Secondary | ICD-10-CM | POA: Diagnosis not present

## 2018-02-22 DIAGNOSIS — L820 Inflamed seborrheic keratosis: Secondary | ICD-10-CM | POA: Diagnosis not present

## 2018-02-25 ENCOUNTER — Encounter: Payer: Self-pay | Admitting: Podiatry

## 2018-02-25 ENCOUNTER — Ambulatory Visit: Payer: Medicare HMO | Admitting: Podiatry

## 2018-02-25 DIAGNOSIS — M779 Enthesopathy, unspecified: Secondary | ICD-10-CM

## 2018-02-25 DIAGNOSIS — L84 Corns and callosities: Secondary | ICD-10-CM

## 2018-02-25 DIAGNOSIS — M7752 Other enthesopathy of left foot: Secondary | ICD-10-CM

## 2018-02-25 MED ORDER — TRIAMCINOLONE ACETONIDE 10 MG/ML IJ SUSP
10.0000 mg | Freq: Once | INTRAMUSCULAR | Status: AC
  Administered 2018-02-25: 10 mg

## 2018-02-25 NOTE — Progress Notes (Signed)
Subjective:   Patient ID: Darrell Perez, male   DOB: 77 y.o.   MRN: [id number]   HPI Patient states the pain does not seem to be in the same spot but I am having a lot of pain around the joint of the fifth metatarsal   ROS      Objective:  Physical Exam  Neurovascular status intact with inflammation pain of the capsule of the fifth MPJ left with keratotic lesion     Assessment:  Capsulitis fifth MPJ left with porokeratotic lesion     Plan:  Debride painful porokeratotic lesion left and I did an injection of the plantar capsule 3 mg Dexasone Kenalog 5 mg Xylocaine

## 2018-03-18 DIAGNOSIS — M4187 Other forms of scoliosis, lumbosacral region: Secondary | ICD-10-CM | POA: Diagnosis not present

## 2018-03-18 DIAGNOSIS — M48061 Spinal stenosis, lumbar region without neurogenic claudication: Secondary | ICD-10-CM | POA: Diagnosis not present

## 2018-03-18 DIAGNOSIS — M545 Low back pain: Secondary | ICD-10-CM | POA: Diagnosis not present

## 2018-03-18 DIAGNOSIS — M5136 Other intervertebral disc degeneration, lumbar region: Secondary | ICD-10-CM | POA: Diagnosis not present

## 2018-03-18 DIAGNOSIS — M419 Scoliosis, unspecified: Secondary | ICD-10-CM | POA: Insufficient documentation

## 2018-03-27 DIAGNOSIS — M545 Low back pain: Secondary | ICD-10-CM | POA: Diagnosis not present

## 2018-03-27 DIAGNOSIS — Z96651 Presence of right artificial knee joint: Secondary | ICD-10-CM | POA: Diagnosis not present

## 2018-03-31 DIAGNOSIS — M545 Low back pain: Secondary | ICD-10-CM | POA: Diagnosis not present

## 2018-04-06 DIAGNOSIS — M545 Low back pain: Secondary | ICD-10-CM | POA: Diagnosis not present

## 2018-04-07 DIAGNOSIS — M48061 Spinal stenosis, lumbar region without neurogenic claudication: Secondary | ICD-10-CM | POA: Diagnosis not present

## 2018-04-07 DIAGNOSIS — M5136 Other intervertebral disc degeneration, lumbar region: Secondary | ICD-10-CM | POA: Diagnosis not present

## 2018-04-07 DIAGNOSIS — M4187 Other forms of scoliosis, lumbosacral region: Secondary | ICD-10-CM | POA: Diagnosis not present

## 2018-04-07 DIAGNOSIS — M4310 Spondylolisthesis, site unspecified: Secondary | ICD-10-CM | POA: Diagnosis not present

## 2018-04-08 DIAGNOSIS — M4310 Spondylolisthesis, site unspecified: Secondary | ICD-10-CM | POA: Insufficient documentation

## 2018-04-08 DIAGNOSIS — M545 Low back pain: Secondary | ICD-10-CM | POA: Diagnosis not present

## 2018-04-16 DIAGNOSIS — M545 Low back pain: Secondary | ICD-10-CM | POA: Diagnosis not present

## 2018-04-20 DIAGNOSIS — M545 Low back pain: Secondary | ICD-10-CM | POA: Diagnosis not present

## 2018-04-21 DIAGNOSIS — K219 Gastro-esophageal reflux disease without esophagitis: Secondary | ICD-10-CM | POA: Diagnosis not present

## 2018-04-21 DIAGNOSIS — F0390 Unspecified dementia without behavioral disturbance: Secondary | ICD-10-CM | POA: Diagnosis not present

## 2018-04-21 DIAGNOSIS — G4733 Obstructive sleep apnea (adult) (pediatric): Secondary | ICD-10-CM | POA: Diagnosis not present

## 2018-04-21 DIAGNOSIS — I10 Essential (primary) hypertension: Secondary | ICD-10-CM | POA: Diagnosis not present

## 2018-04-21 DIAGNOSIS — R05 Cough: Secondary | ICD-10-CM | POA: Diagnosis not present

## 2018-04-22 DIAGNOSIS — M545 Low back pain: Secondary | ICD-10-CM | POA: Diagnosis not present

## 2018-04-27 DIAGNOSIS — M545 Low back pain: Secondary | ICD-10-CM | POA: Diagnosis not present

## 2018-04-29 DIAGNOSIS — M545 Low back pain: Secondary | ICD-10-CM | POA: Diagnosis not present

## 2018-05-20 DIAGNOSIS — G4733 Obstructive sleep apnea (adult) (pediatric): Secondary | ICD-10-CM | POA: Diagnosis not present

## 2018-06-17 ENCOUNTER — Other Ambulatory Visit: Payer: Self-pay | Admitting: Cardiology

## 2018-06-19 DIAGNOSIS — G4733 Obstructive sleep apnea (adult) (pediatric): Secondary | ICD-10-CM | POA: Diagnosis not present

## 2018-07-13 ENCOUNTER — Telehealth: Payer: Self-pay | Admitting: Cardiology

## 2018-07-13 NOTE — Telephone Encounter (Signed)
New Message:       Pt wants to know when does he need his next appt?

## 2018-07-13 NOTE — Telephone Encounter (Signed)
Spoke with pt wife Beth. Adv her that the pt is past due for f/u he was to be seen in Aug 2019. She sts that the pt is doing fine from a cardiac standpoint. appt sch with Dr.Hoch rein on 08/16/18 @ 11:40am Beth aware of appt date and time.

## 2018-07-28 DIAGNOSIS — M79645 Pain in left finger(s): Secondary | ICD-10-CM | POA: Diagnosis not present

## 2018-08-05 DIAGNOSIS — D485 Neoplasm of uncertain behavior of skin: Secondary | ICD-10-CM | POA: Diagnosis not present

## 2018-08-05 DIAGNOSIS — C44212 Basal cell carcinoma of skin of right ear and external auricular canal: Secondary | ICD-10-CM | POA: Diagnosis not present

## 2018-08-05 DIAGNOSIS — Z85828 Personal history of other malignant neoplasm of skin: Secondary | ICD-10-CM | POA: Diagnosis not present

## 2018-08-05 DIAGNOSIS — L570 Actinic keratosis: Secondary | ICD-10-CM | POA: Diagnosis not present

## 2018-08-05 DIAGNOSIS — C4441 Basal cell carcinoma of skin of scalp and neck: Secondary | ICD-10-CM | POA: Diagnosis not present

## 2018-08-05 DIAGNOSIS — L821 Other seborrheic keratosis: Secondary | ICD-10-CM | POA: Diagnosis not present

## 2018-08-14 NOTE — Progress Notes (Signed)
Cardiology Office Note   Date:  08/16/2018   ID:  Darrell Perez, DOB Sep 09, 1940, MRN [id number]  PCP:  Lavone Orn, MD  Cardiologist:   No primary care provider on file.   Chief Complaint  Patient presents with  . Atrial Flutter      History of Present Illness: Darrell Perez is a 78 y.o. male who presents for follow up of atrial flutter.   The patient had atrial flutter during treatment with sepsis secondary to UTI.  He was treated with Eliquis and was Dilt.  He was discharged in NSR.  He did wear a monitor and had no evidence of atrial flutter.    Since then he is done well. The patient denies any new symptoms such as chest discomfort, neck or arm discomfort. There has been no new shortness of breath, PND or orthopnea. There have been no reported palpitations, presyncope or syncope.   He does not exercise very much.  He is limited a little bit by low back pain.   Past Medical History:  Diagnosis Date  . Arthritis   . GERD (gastroesophageal reflux disease)    watches and NO ISSUES WHEN USING CPAP--  NO MEDS  . Hiatal hernia   . History of asthma   . History of kidney stones   . History of multinodular goiter    s/p  thyroid lobectomy 1967  . Hypertension   . Irritable bowel syndrome   . Left knee DJD   . Left ureteral calculus   . Nephrolithiasis    bilateral non-obstrucive per CT 03-23-2017  . OSA on CPAP    cpap does not know settings   . Paroxysmal atrial fibrillation (HCC) newly dx 03-25-2017 (during hospitlization for urosepsis)   A-fib -- Aflutter w/ RVR -- pt started on cardizem and eliquis--- at discharge in NSR/  cardiologist-  dr Percival Spanish    Past Surgical History:  Procedure Laterality Date  . CARPAL TUNNEL RELEASE Bilateral [place]   . CATARACT EXTRACTION W/ INTRAOCULAR LENS  IMPLANT, BILATERAL  2005  &  2012  . CYSTOSCOPY WITH STENT PLACEMENT Left 03/23/2017   Procedure: CYSTOSCOPY, LEFT RETROGRADE WITH STENT PLACEMENT;  Surgeon: Lucas Mallow, MD;  Location: [facility];  Service: Urology;  Laterality: Left;  . CYSTOSCOPY/URETEROSCOPY/HOLMIUM LASER/STENT PLACEMENT Left 04/10/2017   Procedure: CYSTOSCOPY/URETEROSCOPY/HOLMIUM LASER/STENT PLACEMENT;  Surgeon: Festus Aloe, MD;  Location: [facility];  Service: Urology;  Laterality: Left;  . EXTRACORPOREAL SHOCK WAVE LITHOTRIPSY     MULTIPLE  . HEMIARTHROPLASTY RIGHT SHOULDER  2007  . HOLMIUM LASER APPLICATION Left 2/83/1517   Procedure: HOLMIUM LASER APPLICATION;  Surgeon: Fredricka Bonine, MD;  Location: [facility];  Service: Urology;  Laterality: Left;  . [place]   removal of stones. (OPEN PROCUDURE)  . REVERSE SHOULDER ARTHROPLASTY  10/09/2011   Procedure: REVERSE SHOULDER ARTHROPLASTY;  Surgeon: Marin Shutter, MD;  Location: [place];  Service: Orthopedics;  Laterality: Right;  right hardware removal and reverse shoulder arthroplasty  . THYROID LOBECTOMY  1967   goiter removed  . TONSILLECTOMY    . TOTAL KNEE ARTHROPLASTY  07/22/2012   Procedure: TOTAL KNEE ARTHROPLASTY;  Surgeon: Johnn Hai, MD;  Location: [facility];  Service: Orthopedics;  Laterality: Right;  . TOTAL KNEE ARTHROPLASTY Left 12/16/2012   Procedure: LEFT TOTAL KNEE ARTHROPLASTY;  Surgeon: Johnn Hai, MD;  Location: [facility];  Service: Orthopedics;  Laterality: Left;  . TRANSTHORACIC ECHOCARDIOGRAM  03/25/2017   mild LVH, ef 55-60%/  mild LAE/ trivial PR/  mild MR  . TRIGGER FINGER RELEASE Right 2006  . URETEROSOPIC STONE EXTRACTION  2002     Current Outpatient Medications  Medication Sig Dispense Refill  . acetaminophen (TYLENOL) 500 MG tablet Take by mouth every 6 (six) hours as needed for mild pain, moderate pain, fever or headache.     . allopurinol (ZYLOPRIM) 100 MG tablet Take 100 mg by mouth every morning.     . cholecalciferol (VITAMIN D) 1000 UNITS tablet Take 1,000 Units by mouth daily.    [name] diltiazem (CARDIZEM CD) 240 MG 24 hr  capsule Take 1 capsule (240 mg total) by mouth every morning. Patient needs appointment 90 capsule 0  . galantamine (RAZADYNE) 4 MG tablet Take 4 mg by mouth 2 (two) times daily with a meal.    . GALANTAMINE HYDROBROMIDE PO Take 2 tablets by mouth daily.     [name] HYDROcodone-acetaminophen (NORCO/VICODIN) 5-325 MG tablet Take 1 tablet by mouth every 6 (six) hours as needed for moderate pain.    . hyoscyamine (ANASPAZ) 0.125 MG TBDP disintergrating tablet Place 0.125 mg under the tongue every 6 (six) hours as needed for cramping.    . indapamide (LOZOL) 2.5 MG tablet Take 2.5 mg by mouth every evening.     . loperamide (IMODIUM) 2 MG capsule Take 1-2 mg by mouth as needed.    . loratadine (CLARITIN) 10 MG tablet Take 10 mg by mouth every morning.     . pantoprazole (PROTONIX) 40 MG tablet Take 1 tablet by mouth daily.    . polyethylene glycol (MIRALAX / GLYCOLAX) packet Take 17 g by mouth daily. (Patient taking differently: Take 17 g by mouth daily as needed. ) 14 each 0  . potassium chloride SA (K-DUR,KLOR-CON) 20 MEQ tablet Take 20 mEq by mouth 2 (two) times daily.    [name] saccharomyces boulardii (FLORASTOR) 250 MG capsule Take 1 capsule (250 mg total) by mouth 2 (two) times daily. 20 capsule 3  . vitamin B-12 (CYANOCOBALAMIN) 1000 MCG tablet Take 1,000 mcg by mouth every morning.      No current facility-administered medications for this visit.     Allergies:   Naprosyn [naproxen]    ROS:  Please see the history of present illness.   Otherwise, review of systems are positive for none.   All other systems are reviewed and negative.    PHYSICAL EXAM: VS:  BP (!) 146/74   Pulse (!) 46   Ht 5\' 9"  (1.753 m)   Wt 207 lb 12.8 oz (94.3 kg)   BMI 30.69 kg/m  , BMI Body mass index is 30.69 kg/m. GENERAL:  Well appearing NECK:  No jugular venous distention, waveform within normal limits, carotid upstroke brisk and symmetric, no bruits, no thyromegaly LUNGS:  Clear to auscultation bilaterally CHEST:   Unremarkable HEART:  PMI not displaced or sustained,S1 and S2 within normal limits, no S3, no S4, no clicks, no rubs, no murmurs ABD:  Flat, positive bowel sounds normal in frequency in pitch, no bruits, no rebound, no guarding, no midline pulsatile mass, no hepatomegaly, no splenomegaly EXT:  2 plus pulses throughout, no edema, no cyanosis no clubbing   EKG:  EKG is ordered today. Sinus bradycardia, rate 46, axis within normal limits, intervals within normal limits, no acute ST-T wave changes.  Recent Labs: No results found for requested labs within last 8760 hours.  Lipid Panel No results found for: CHOL, TRIG, HDL, CHOLHDL, VLDL, LDLCALC, LDLDIRECT    Wt Readings from Last 3 Encounters:  08/16/18 207 lb 12.8 oz (94.3 kg)  01/15/18 203 lb (92.1 kg)  08/06/17 202 lb 6.4 oz (91.8 kg)      Other studies Reviewed: Additional studies/ records that were reviewed today include: None Review of the above records demonstrates:  NA   ASSESSMENT AND PLAN:   ATRIAL FLUTTER:    Darrell Perez has a CHA2DS2 - VASc score of 2.   He has had no symptomatic recurrence.  His heart rate is slow but he tolerates this.  No change in therapy.  He will continue with meds as listed.  Of note since his flutter/fib was in the face of urinary infection and sepsis and he is never had recurrence he has chosen to not be on anticoagulation.  He can follow-up as needed.  Certainly if he ever becomes symptomatic with his low heart rate get back up on the Cardizem.  HTN:  The blood pressure is elevated today but this is unusual and I reviewed previous readings.  He says is well controlled at home and is been well controlled at recent doctor's appointments.  No change in therapy.   Current medicines are reviewed at length with the patient today.  The patient does not have concerns regarding medicines.  The following changes have been made:  None  Labs/ tests ordered today include: None  Orders Placed  This Encounter  Procedures  . EKG 12-Lead     Disposition:   FU with me as needed.     Signed, Minus Breeding, MD  08/16/2018 12:46 PM    [facility]

## 2018-08-16 ENCOUNTER — Encounter: Payer: Self-pay | Admitting: Cardiology

## 2018-08-16 ENCOUNTER — Ambulatory Visit: Payer: PPO | Admitting: Cardiology

## 2018-08-16 VITALS — BP 146/74 | HR 46 | Ht 69.0 in | Wt 207.8 lb

## 2018-08-16 DIAGNOSIS — G4733 Obstructive sleep apnea (adult) (pediatric): Secondary | ICD-10-CM | POA: Diagnosis not present

## 2018-08-16 DIAGNOSIS — I480 Paroxysmal atrial fibrillation: Secondary | ICD-10-CM | POA: Diagnosis not present

## 2018-08-16 DIAGNOSIS — I4892 Unspecified atrial flutter: Secondary | ICD-10-CM | POA: Diagnosis not present

## 2018-08-16 DIAGNOSIS — I10 Essential (primary) hypertension: Secondary | ICD-10-CM

## 2018-08-16 DIAGNOSIS — F0390 Unspecified dementia without behavioral disturbance: Secondary | ICD-10-CM | POA: Diagnosis not present

## 2018-08-16 NOTE — Patient Instructions (Signed)
Medication Instructions:  Continue current medications ' If you need a refill on your cardiac medications before your next appointment, please call your pharmacy.  Labwork: None Ordered   Take the provided lab slips with you to the lab for your blood draw.   When you have your labs (blood work) drawn today and your tests are completely normal, you will receive your results only by MyChart Message (if you have MyChart) -OR-  A paper copy in the mail.  If you have any lab test that is abnormal or we need to change your treatment, we will call you to review these results.  Testing/Procedures: None Ordered  Follow-Up: . Your physician recommends that you schedule a follow-up appointment in: As Needed   At [facility], you and your health needs are our priority.  As part of our continuing mission to provide you with exceptional heart care, we have created designated Provider Care Teams.  These Care Teams include your primary Cardiologist (physician) and Advanced Practice Providers (APPs -  Physician Assistants and Nurse Practitioners) who all work together to provide you with the care you need, when you need it.  Thank you for choosing [facility] at [facility]!!

## 2018-08-18 DIAGNOSIS — N401 Enlarged prostate with lower urinary tract symptoms: Secondary | ICD-10-CM | POA: Diagnosis not present

## 2018-08-18 DIAGNOSIS — N200 Calculus of kidney: Secondary | ICD-10-CM | POA: Diagnosis not present

## 2018-08-18 DIAGNOSIS — R3915 Urgency of urination: Secondary | ICD-10-CM | POA: Diagnosis not present

## 2018-08-20 DIAGNOSIS — Z471 Aftercare following joint replacement surgery: Secondary | ICD-10-CM | POA: Diagnosis not present

## 2018-08-20 DIAGNOSIS — Z96611 Presence of right artificial shoulder joint: Secondary | ICD-10-CM | POA: Diagnosis not present

## 2018-08-20 DIAGNOSIS — M25512 Pain in left shoulder: Secondary | ICD-10-CM | POA: Diagnosis not present

## 2018-08-20 DIAGNOSIS — G4733 Obstructive sleep apnea (adult) (pediatric): Secondary | ICD-10-CM | POA: Diagnosis not present

## 2018-08-31 DIAGNOSIS — Z85828 Personal history of other malignant neoplasm of skin: Secondary | ICD-10-CM | POA: Diagnosis not present

## 2018-08-31 DIAGNOSIS — C44212 Basal cell carcinoma of skin of right ear and external auricular canal: Secondary | ICD-10-CM | POA: Diagnosis not present

## 2018-09-01 ENCOUNTER — Encounter: Payer: Self-pay | Admitting: Neurology

## 2018-09-01 ENCOUNTER — Ambulatory Visit: Payer: PPO | Admitting: Neurology

## 2018-09-01 ENCOUNTER — Other Ambulatory Visit: Payer: Self-pay

## 2018-09-01 VITALS — BP 134/76 | HR 47 | Ht 69.0 in | Wt 198.0 lb

## 2018-09-01 DIAGNOSIS — F0390 Unspecified dementia without behavioral disturbance: Secondary | ICD-10-CM

## 2018-09-01 DIAGNOSIS — F03A0 Unspecified dementia, mild, without behavioral disturbance, psychotic disturbance, mood disturbance, and anxiety: Secondary | ICD-10-CM

## 2018-09-01 MED ORDER — GALANTAMINE HYDROBROMIDE 12 MG PO TABS: 12.0000 mg | ORAL_TABLET | Freq: Two times a day (BID) | ORAL | 6 refills | Status: DC

## 2018-09-01 NOTE — Patient Instructions (Signed)
1. Increase galantamine to 12mg  tablet: Take 1 tablet twice a day. If no problems with the medication, call our office to send a 90-day prescription to [facility]  2. Follow-up in 6 months or so, call for any changes  FALL PRECAUTIONS: Be cautious when walking. Scan the area for obstacles that may increase the risk of trips and falls. When getting up in the mornings, sit up at the edge of the bed for a few minutes before getting out of bed. Consider elevating the bed at the head end to avoid drop of blood pressure when getting up. Walk always in a well-lit room (use night lights in the walls). Avoid area rugs or power cords from appliances in the middle of the walkways. Use a walker or a cane if necessary and consider physical therapy for balance exercise. Get your eyesight checked regularly.  HOME SAFETY: Consider the safety of the kitchen when operating appliances like stoves, microwave oven, and blender. Consider having supervision and share cooking responsibilities until no longer able to participate in those. Accidents with firearms and other hazards in the house should be identified and addressed as well.  DRIVING: Regarding driving, in patients with progressive memory problems, driving will be impaired. We advise to have someone else do the driving if trouble finding directions or if minor accidents are reported. Independent driving assessment is available to determine safety of driving.  ABILITY TO BE LEFT ALONE: If patient is unable to contact 911 operator, consider using LifeLine, or when the need is there, arrange for someone to stay with patients. Smoking is a fire hazard, consider supervision or cessation. Risk of wandering should be assessed by caregiver and if detected at any point, supervision and safe proof recommendations should be instituted.  RECOMMENDATIONS FOR ALL PATIENTS WITH MEMORY PROBLEMS: 1. Continue to exercise (Recommend 30 minutes of walking everyday, or 3 hours every  week) 2. Increase social interactions - continue going to [place] and enjoy social gatherings with friends and family 3. Eat healthy, avoid fried foods and eat more fruits and vegetables 4. Maintain adequate blood pressure, blood sugar, and blood cholesterol level. Reducing the risk of stroke and cardiovascular disease also helps promoting better memory. 5. Avoid stressful situations. Live a simple life and avoid aggravations. Organize your time and prepare for the next day in anticipation. 6. Sleep well, avoid any interruptions of sleep and avoid any distractions in the bedroom that may interfere with adequate sleep quality 7. Avoid sugar, avoid sweets as there is a strong link between excessive sugar intake, diabetes, and cognitive impairment The Mediterranean diet has been shown to help patients reduce the risk of progressive memory disorders and reduces cardiovascular risk. This includes eating fish, eat fruits and green leafy vegetables, nuts like almonds and hazelnuts, walnuts, and also use olive oil. Avoid fast foods and fried foods as much as possible. Avoid sweets and sugar as sugar use has been linked to worsening of memory function.  There is always a concern of gradual progression of memory problems. If this is the case, then we may need to adjust level of care according to patient needs. Support, both to the patient and caregiver, should then be put into place.

## 2018-09-01 NOTE — Progress Notes (Signed)
NEUROLOGY FOLLOW UP OFFICE NOTE  Darrell Perez [id number] August 18, 1940  HISTORY OF PRESENT ILLNESS: I had the pleasure of seeing Darrell Perez in follow-up in the neurology clinic on 09/01/2018.  The patient was last seen 8 months ago for mild dementia. He is again accompanied by his wife who helps supplement the history today.  MMSE 28/30 in July 2018. Since his last visit, he and his wife report he has good and bad days with his memory. He drives minimally and does okay with short distances. He manages his own medications without difficulties. His wife manages bills now. On his last visit, they reported galantamine was cost-prohibitive and he was switched to Namenda. His wife reports that he had significant side effects with generalized weakness that improved after stopping medication. Dr. Laurann Montana increased galantamine to 6mg  BID in Sept/Oct, with change in insurance, cost is not an issues any longer. No side effects. His wife denies any paranoia or hallucinations, but he gets a little more aggravated with himself when he forgets. Sleep is good with his CPAP. He has been a little dizzy recently. He fell last week when he tripped on a curb at a gas station, no head injuries. He denies any significant headaches, vision changes, focal numbness/tingling/weakness.   History on Initial Assessment: This is a pleasant 78 year old right-handed man with a history of hypertension, goiter s/p thyroid lobectomy, atrial fibrillation, dementia, presenting to establish care. He feels his memory is pretty good. His wife started noticing memory changes around 5 years ago where he was getting more forgetful, but worse in the past 1-2 years. He had sepsis in the Fall and things seemed to accelerate then. He had more difficulties initially when he got home, but has acclimated since then. His wife has taken over most of the driving after he made her nervous trying to turn left onto oncoming traffic 1.5 years ago and did not  seem to comprehend this was difficult. He continues to manage his own medications. There have been a few bills that he has let go too long without paying, which is unusual. He has word-finding difficulties. His wife denies any personality changes, no paranoia or hallucinations. He had abnormal dreams on Aricept and is taking Galantamine, however this is quite costly for their budget and his wife wonders if there is any true benefit from it. No side effects on galantamine.  He has low back pain and both legs feel weak. Otherwise he denies any headaches, dizziness, vision changes, dysarthria/dysphagia, neck pain, focal numbness/tingling, bowel/bladder dysfunction, anosmia, or tremors. Sleep is good. No falls. Both parents had dementia. He denies any history of significant head injuries or alcohol use.   I personally reviewed MRI brain with and without contrast done 10/2015 which did not show any acute changes. There was mild dilatation of the lateral ventricles with sparing of the temporal horns, mild diffuse atrophy and chronic microvascular disease.  Laboratory Data: TSH 03/2017 normal 1.13  PAST MEDICAL HISTORY: Past Medical History:  Diagnosis Date  . Arthritis   . GERD (gastroesophageal reflux disease)    watches and NO ISSUES WHEN USING CPAP--  NO MEDS  . Hiatal hernia   . History of asthma   . History of kidney stones   . History of multinodular goiter    s/p  thyroid lobectomy 1967  . Hypertension   . Irritable bowel syndrome   . Left knee DJD   . Left ureteral calculus   . Nephrolithiasis  bilateral non-obstrucive per CT 03-23-2017  . OSA on CPAP    cpap does not know settings   . Paroxysmal atrial fibrillation (HCC) newly dx 03-25-2017 (during hospitlization for urosepsis)   A-fib -- Aflutter w/ RVR -- pt started on cardizem and eliquis--- at discharge in [facility]/  cardiologist-  dr hochrein    MEDICATIONS: Current Outpatient Medications on File Prior to Visit  Medication Sig  Dispense Refill  . acetaminophen (TYLENOL) 500 MG tablet Take by mouth every 6 (six) hours as needed for mild pain, moderate pain, fever or headache.     . allopurinol (ZYLOPRIM) 100 MG tablet Take 100 mg by mouth every morning.     . cholecalciferol (VITAMIN D) 1000 UNITS tablet Take 1,000 Units by mouth daily.    [name] diltiazem (CARDIZEM CD) 240 MG 24 hr capsule Take 1 capsule (240 mg total) by mouth every morning. Patient needs appointment 90 capsule 0  . galantamine (RAZADYNE) 4 MG tablet Take 4 mg by mouth 2 (two) times daily with a meal.    . GALANTAMINE HYDROBROMIDE PO Take 2 tablets by mouth daily.     [name] HYDROcodone-acetaminophen (NORCO/VICODIN) 5-325 MG tablet Take 1 tablet by mouth every 6 (six) hours as needed for moderate pain.    . hyoscyamine (ANASPAZ) 0.125 MG TBDP disintergrating tablet Place 0.125 mg under the tongue every 6 (six) hours as needed for cramping.    . indapamide (LOZOL) 2.5 MG tablet Take 2.5 mg by mouth every evening.     . loperamide (IMODIUM) 2 MG capsule Take 1-2 mg by mouth as needed.    . loratadine (CLARITIN) 10 MG tablet Take 10 mg by mouth every morning.     . pantoprazole (PROTONIX) 40 MG tablet Take 1 tablet by mouth daily.    . polyethylene glycol (MIRALAX / GLYCOLAX) packet Take 17 g by mouth daily. (Patient taking differently: Take 17 g by mouth daily as needed. ) 14 each 0  . potassium chloride SA (K-DUR,KLOR-CON) 20 MEQ tablet Take 20 mEq by mouth 2 (two) times daily.    [name] saccharomyces boulardii (FLORASTOR) 250 MG capsule Take 1 capsule (250 mg total) by mouth 2 (two) times daily. 20 capsule 3  . tolterodine (DETROL LA) 4 MG 24 hr capsule Take by mouth daily.    . vitamin B-12 (CYANOCOBALAMIN) 1000 MCG tablet Take 1,000 mcg by mouth every morning.      No current facility-administered medications on file prior to visit.     ALLERGIES: Allergies  Allergen Reactions  . Naprosyn [Naproxen] Other (See Comments)    "makes me jittery/nervous"     FAMILY HISTORY: Family History  Problem Relation Age of Onset  . Dementia Mother   . Dementia Father   . Anesthesia problems Neg Hx   . Hypotension Neg Hx   . Malignant hyperthermia Neg Hx   . Pseudochol deficiency Neg Hx     SOCIAL HISTORY: Social History   Socioeconomic History  . Marital status: Married    Spouse name: Not on file  . Number of children: Not on file  . Years of education: Not on file  . Highest education level: Not on file  Occupational History  . Not on file  Social Needs  . Financial resource strain: Not on file  . Food insecurity:    Worry: Not on file    Inability: Not on file  . Transportation needs:    Medical: Not on file    Non-medical: Not on file  Tobacco Use  . Smoking status: Never Smoker  . Smokeless tobacco: Never Used  Substance and Sexual Activity  . Alcohol use: No  . Drug use: No  . Sexual activity: Not on file  Lifestyle  . Physical activity:    Days per week: Not on file    Minutes per session: Not on file  . Stress: Not on file  Relationships  . Social connections:    Talks on phone: Not on file    Gets together: Not on file    Attends religious service: Not on file    Active member of club or organization: Not on file    Attends meetings of clubs or organizations: Not on file    Relationship status: Not on file  . Intimate partner violence:    Fear of current or ex partner: Not on file    Emotionally abused: Not on file    Physically abused: Not on file    Forced sexual activity: Not on file  Other Topics Concern  . Not on file  Social History Narrative   Pt lives in 1 story home with his wife   Has 2 adult children   4 year degree   Retired Optometrist     REVIEW OF SYSTEMS: Constitutional: No fevers, chills, or sweats, no generalized fatigue, change in appetite Eyes: No visual changes, double vision, eye pain Ear, nose and throat: No hearing loss, ear pain, nasal congestion, sore throat Cardiovascular:  No chest pain, palpitations Respiratory:  No shortness of breath at rest or with exertion, wheezes GastrointestinaI: No nausea, vomiting, diarrhea, abdominal pain, fecal incontinence Genitourinary:  No dysuria, urinary retention or frequency Musculoskeletal:  No neck pain, back pain Integumentary: No rash, pruritus, skin lesions Neurological: as above Psychiatric: No depression, insomnia, anxiety Endocrine: No palpitations, fatigue, diaphoresis, mood swings, change in appetite, change in weight, increased thirst Hematologic/Lymphatic:  No anemia, purpura, petechiae. Allergic/Immunologic: no itchy/runny eyes, nasal congestion, recent allergic reactions, rashes  PHYSICAL EXAM: Vitals:   09/01/18 0846  BP: 134/76  Pulse: (!) 47  SpO2: 97%   General: No acute distress Head:  Normocephalic/atraumatic Neck: supple, no paraspinal tenderness, full range of motion Heart:  Regular rate and rhythm Lungs:  Clear to auscultation bilaterally Back: No paraspinal tenderness Skin/Extremities: No rash, no edema Neurological Exam: alert and oriented to person, place, and time. No aphasia or dysarthria. Fund of knowledge is appropriate.  Recent and remote memory are impaired.  Attention and concentration are normal.  Able to name objects. Difficulty with repetition and fluency. Montreal Cognitive Assessment  09/01/2018  Visuospatial/ Executive (0/5) 3  Naming (0/3) 2  Attention: Read list of digits (0/2) 1  Attention: Read list of letters (0/1) 1  Attention: Serial 7 subtraction starting at 100 (0/3) 2  Language: Repeat phrase (0/2) 0  Language : Fluency (0/1) 0  Abstraction (0/2) 2  Delayed Recall (0/5) 1  Orientation (0/6) 6  Total 18   Cranial nerves: Pupils equal, round, reactive to light. Extraocular movements intact with no nystagmus. Visual fields full. Facial sensation intact. No facial asymmetry. Tongue, uvula, palate midline.  Motor: Bulk and tone normal, muscle strength 5/5 throughout  with no pronator drift.  Sensation to light touch intact.  No extinction to double simultaneous stimulation. Finger to nose testing intact.  Gait slow and cautious due to back pain, no ataxia.  IMPRESSION: This is a pleasant 77 yo RH man with a history of  hypertension, goiter s/p thyroid lobectomy, atrial fibrillation,  with mild dementia, likely due to Alzheimer's disease. His MOCA score today is 18/30 (MMSE 28/30 in July 2019). We discussed increasing Galantamine to 12mg  BID. He had side effects on Donepezil and Memantine. Continue to monitor driving. We again discussed the importance of control of vascular risk factors, physical exercise, and brain stimulation exercises. Follow-up in 6 months, they know to call for any changes  Thank you for allowing me to participate in his care.  Please do not hesitate to call for any questions or concerns.  The duration of this appointment visit was 30 minutes of face-to-face time with the patient.  Greater than 50% of this time was spent in counseling, explanation of diagnosis, planning of further management, and coordination of care.   Ellouise Newer, M.D.   CC: Dr. Laurann Montana

## 2018-09-14 ENCOUNTER — Telehealth: Payer: Self-pay

## 2018-09-14 NOTE — Telephone Encounter (Signed)
Received after hours / lunch hours message that pt's wife, [name], would like to speak about pt's medications.  No additional information was given  Returned call.  No answer.  LMOM asking for return call.

## 2018-09-15 ENCOUNTER — Telehealth: Payer: Self-pay | Admitting: Neurology

## 2018-09-15 NOTE — Telephone Encounter (Signed)
Patient is returning your call about medication. Patient wife left message with after hour service

## 2018-09-18 DIAGNOSIS — G4733 Obstructive sleep apnea (adult) (pediatric): Secondary | ICD-10-CM | POA: Diagnosis not present

## 2018-10-19 DIAGNOSIS — G4733 Obstructive sleep apnea (adult) (pediatric): Secondary | ICD-10-CM | POA: Diagnosis not present

## 2018-10-29 DIAGNOSIS — G4733 Obstructive sleep apnea (adult) (pediatric): Secondary | ICD-10-CM | POA: Diagnosis not present

## 2018-10-29 DIAGNOSIS — E669 Obesity, unspecified: Secondary | ICD-10-CM | POA: Diagnosis not present

## 2018-10-29 DIAGNOSIS — Z1389 Encounter for screening for other disorder: Secondary | ICD-10-CM | POA: Diagnosis not present

## 2018-10-29 DIAGNOSIS — F0390 Unspecified dementia without behavioral disturbance: Secondary | ICD-10-CM | POA: Diagnosis not present

## 2018-10-29 DIAGNOSIS — Z0000 Encounter for general adult medical examination without abnormal findings: Secondary | ICD-10-CM | POA: Diagnosis not present

## 2018-10-29 DIAGNOSIS — R21 Rash and other nonspecific skin eruption: Secondary | ICD-10-CM | POA: Diagnosis not present

## 2018-10-29 DIAGNOSIS — K219 Gastro-esophageal reflux disease without esophagitis: Secondary | ICD-10-CM | POA: Diagnosis not present

## 2018-10-29 DIAGNOSIS — I10 Essential (primary) hypertension: Secondary | ICD-10-CM | POA: Diagnosis not present

## 2018-11-18 DIAGNOSIS — G4733 Obstructive sleep apnea (adult) (pediatric): Secondary | ICD-10-CM | POA: Diagnosis not present

## 2018-12-19 DIAGNOSIS — G4733 Obstructive sleep apnea (adult) (pediatric): Secondary | ICD-10-CM | POA: Diagnosis not present

## 2018-12-29 DIAGNOSIS — R3129 Other microscopic hematuria: Secondary | ICD-10-CM | POA: Diagnosis not present

## 2018-12-29 DIAGNOSIS — Z87442 Personal history of urinary calculi: Secondary | ICD-10-CM | POA: Diagnosis not present

## 2018-12-29 DIAGNOSIS — R3915 Urgency of urination: Secondary | ICD-10-CM | POA: Diagnosis not present

## 2018-12-29 DIAGNOSIS — R351 Nocturia: Secondary | ICD-10-CM | POA: Diagnosis not present

## 2019-01-05 DIAGNOSIS — M5136 Other intervertebral disc degeneration, lumbar region: Secondary | ICD-10-CM | POA: Diagnosis not present

## 2019-01-05 DIAGNOSIS — M4310 Spondylolisthesis, site unspecified: Secondary | ICD-10-CM | POA: Diagnosis not present

## 2019-01-06 DIAGNOSIS — S70252A Superficial foreign body, left hip, initial encounter: Secondary | ICD-10-CM | POA: Diagnosis not present

## 2019-01-06 DIAGNOSIS — W57XXXA Bitten or stung by nonvenomous insect and other nonvenomous arthropods, initial encounter: Secondary | ICD-10-CM | POA: Diagnosis not present

## 2019-01-07 DIAGNOSIS — A932 Colorado tick fever: Secondary | ICD-10-CM | POA: Diagnosis not present

## 2019-01-18 DIAGNOSIS — G4733 Obstructive sleep apnea (adult) (pediatric): Secondary | ICD-10-CM | POA: Diagnosis not present

## 2019-02-04 DIAGNOSIS — I480 Paroxysmal atrial fibrillation: Secondary | ICD-10-CM | POA: Diagnosis not present

## 2019-02-04 DIAGNOSIS — F0390 Unspecified dementia without behavioral disturbance: Secondary | ICD-10-CM | POA: Diagnosis not present

## 2019-02-04 DIAGNOSIS — I10 Essential (primary) hypertension: Secondary | ICD-10-CM | POA: Diagnosis not present

## 2019-02-18 DIAGNOSIS — G4733 Obstructive sleep apnea (adult) (pediatric): Secondary | ICD-10-CM | POA: Diagnosis not present

## 2019-03-03 DIAGNOSIS — H43813 Vitreous degeneration, bilateral: Secondary | ICD-10-CM | POA: Diagnosis not present

## 2019-03-03 DIAGNOSIS — H52223 Regular astigmatism, bilateral: Secondary | ICD-10-CM | POA: Diagnosis not present

## 2019-03-03 DIAGNOSIS — Z961 Presence of intraocular lens: Secondary | ICD-10-CM | POA: Diagnosis not present

## 2019-03-03 DIAGNOSIS — H524 Presbyopia: Secondary | ICD-10-CM | POA: Diagnosis not present

## 2019-03-03 DIAGNOSIS — H1045 Other chronic allergic conjunctivitis: Secondary | ICD-10-CM | POA: Diagnosis not present

## 2019-03-03 DIAGNOSIS — H5201 Hypermetropia, right eye: Secondary | ICD-10-CM | POA: Diagnosis not present

## 2019-03-04 ENCOUNTER — Emergency Department (HOSPITAL_COMMUNITY): Payer: PPO

## 2019-03-04 ENCOUNTER — Other Ambulatory Visit: Payer: Self-pay

## 2019-03-04 ENCOUNTER — Encounter (HOSPITAL_COMMUNITY): Payer: Self-pay

## 2019-03-04 ENCOUNTER — Emergency Department (HOSPITAL_COMMUNITY)
Admission: EM | Admit: 2019-03-04 | Discharge: 2019-03-04 | Disposition: A | Discharge status: Home / Self Care | Payer: PPO | Attending: Emergency Medicine | Admitting: Emergency Medicine

## 2019-03-04 DIAGNOSIS — Y929 Unspecified place or not applicable: Secondary | ICD-10-CM | POA: Diagnosis not present

## 2019-03-04 DIAGNOSIS — Z79899 Other long term (current) drug therapy: Secondary | ICD-10-CM | POA: Insufficient documentation

## 2019-03-04 DIAGNOSIS — I10 Essential (primary) hypertension: Secondary | ICD-10-CM | POA: Diagnosis not present

## 2019-03-04 DIAGNOSIS — Z886 Allergy status to analgesic agent status: Secondary | ICD-10-CM | POA: Insufficient documentation

## 2019-03-04 DIAGNOSIS — M25461 Effusion, right knee: Secondary | ICD-10-CM | POA: Diagnosis not present

## 2019-03-04 DIAGNOSIS — S40811A Abrasion of right upper arm, initial encounter: Secondary | ICD-10-CM | POA: Diagnosis not present

## 2019-03-04 DIAGNOSIS — Y9301 Activity, walking, marching and hiking: Secondary | ICD-10-CM | POA: Diagnosis not present

## 2019-03-04 DIAGNOSIS — J45909 Unspecified asthma, uncomplicated: Secondary | ICD-10-CM | POA: Insufficient documentation

## 2019-03-04 DIAGNOSIS — W109XXA Fall (on) (from) unspecified stairs and steps, initial encounter: Secondary | ICD-10-CM | POA: Diagnosis not present

## 2019-03-04 DIAGNOSIS — W19XXXA Unspecified fall, initial encounter: Secondary | ICD-10-CM

## 2019-03-04 DIAGNOSIS — Y999 Unspecified external cause status: Secondary | ICD-10-CM | POA: Insufficient documentation

## 2019-03-04 DIAGNOSIS — I480 Paroxysmal atrial fibrillation: Secondary | ICD-10-CM | POA: Insufficient documentation

## 2019-03-04 DIAGNOSIS — R220 Localized swelling, mass and lump, head: Secondary | ICD-10-CM | POA: Diagnosis not present

## 2019-03-04 DIAGNOSIS — S80211A Abrasion, right knee, initial encounter: Secondary | ICD-10-CM | POA: Diagnosis not present

## 2019-03-04 DIAGNOSIS — S0990XA Unspecified injury of head, initial encounter: Secondary | ICD-10-CM | POA: Insufficient documentation

## 2019-03-04 DIAGNOSIS — S0081XA Abrasion of other part of head, initial encounter: Secondary | ICD-10-CM | POA: Diagnosis not present

## 2019-03-04 NOTE — ED Notes (Signed)
Patient transported to X-ray 

## 2019-03-04 NOTE — ED Triage Notes (Signed)
Pt presents with c/o fall. Pt reports he missed a step and fell, abrasion to his head, right knee, right arm, bleeding controlled. Pt denies any LOC, alert and oriented.

## 2019-03-04 NOTE — Discharge Instructions (Addendum)
Return here as needed.  Your CT scan and x-rays do not show any significant abnormalities at this time.  Follow-up with your doctor.  Use ice and heat on the areas that are sore Tylenol and if you can take Motrin for pain.

## 2019-03-10 DIAGNOSIS — D229 Melanocytic nevi, unspecified: Secondary | ICD-10-CM | POA: Diagnosis not present

## 2019-03-10 DIAGNOSIS — L821 Other seborrheic keratosis: Secondary | ICD-10-CM | POA: Diagnosis not present

## 2019-03-10 DIAGNOSIS — L298 Other pruritus: Secondary | ICD-10-CM | POA: Diagnosis not present

## 2019-03-10 DIAGNOSIS — L819 Disorder of pigmentation, unspecified: Secondary | ICD-10-CM | POA: Diagnosis not present

## 2019-03-10 DIAGNOSIS — L814 Other melanin hyperpigmentation: Secondary | ICD-10-CM | POA: Diagnosis not present

## 2019-03-10 NOTE — ED Provider Notes (Signed)
[facility] DEPT Provider Note   CSN: [id number] Arrival date & time: 03/04/19  1536     History   Chief Complaint Chief Complaint  Patient presents with  . Fall    HPI Darrell Perez is a 78 y.o. male.     HPI Patient presents to the emergency department with injuries following a fall that occurred prior to arrival.  The patient states he missed 1 of the steps and fell forward hitting his head and has an abrasion to the knee arm on the right.  Patient states he did not lose consciousness.  Patient states he was sent here by urgent care for a CT scan of his head.  The patient denies chest pain, shortness of breath, headache,blurred vision, neck pain, fever, cough, weakness, numbness, dizziness, anorexia, edema, abdominal pain, nausea, vomiting, diarrhea, rash, back pain, dysuria, hematemesis, bloody stool, near syncope, or syncope. Past Medical History:  Diagnosis Date  . Arthritis   . GERD (gastroesophageal reflux disease)    watches and NO ISSUES WHEN USING CPAP--  NO MEDS  . Hiatal hernia   . History of asthma   . History of kidney stones   . History of multinodular goiter    s/p  thyroid lobectomy 1967  . Hypertension   . Irritable bowel syndrome   . Left knee DJD   . Left ureteral calculus   . Nephrolithiasis    bilateral non-obstrucive per CT 03-23-2017  . OSA on CPAP    cpap does not know settings   . Paroxysmal atrial fibrillation (HCC) newly dx 03-25-2017 (during hospitlization for urosepsis)   A-fib -- Aflutter w/ RVR -- pt started on cardizem and eliquis--- at discharge in [facility]/  cardiologist-  dr hochrein    Patient Active Problem List   Diagnosis Date Noted  . Mild dementia ([place]) 01/22/2018  . History of total knee replacement, right 01/21/2018  . Atrial flutter ([place]) 04/02/2017  . Anticoagulated 04/02/2017  . Right carotid bruit 04/02/2017  . Sepsis ([place]) 03/25/2017  . Pyelonephritis 03/25/2017  . Left ureteral calculus    . Ureteral stone 03/23/2017  . Nephrolithiasis 03/23/2017  . BPH (benign prostatic hypertrophy) 02/03/2013  . Extrinsic asthma, unspecified 01/27/2013  . Essential hypertension, benign 01/27/2013  . Gout, unspecified 01/27/2013  . 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 CARPAL TUNNEL RELEASE Bilateral [place]   . CATARACT EXTRACTION W/ INTRAOCULAR LENS  IMPLANT, BILATERAL  2005  &  2012  . CYSTOSCOPY WITH STENT PLACEMENT Left 03/23/2017   Procedure: CYSTOSCOPY, LEFT RETROGRADE WITH STENT PLACEMENT;  Surgeon: Lucas Mallow, MD;  Location: [facility];  Service: Urology;  Laterality: Left;  . CYSTOSCOPY/URETEROSCOPY/HOLMIUM LASER/STENT PLACEMENT Left 04/10/2017   Procedure: CYSTOSCOPY/URETEROSCOPY/HOLMIUM LASER/STENT PLACEMENT;  Surgeon: Festus Aloe, MD;  Location: [facility];  Service: Urology;  Laterality: Left;  . EXTRACORPOREAL SHOCK WAVE LITHOTRIPSY     MULTIPLE  . HEMIARTHROPLASTY RIGHT SHOULDER  2007  . HOLMIUM LASER APPLICATION Left [id number]   Procedure: HOLMIUM LASER APPLICATION;  Surgeon: Fredricka Bonine, MD;  Location: [facility];  Service: Urology;  Laterality: Left;  . [place]   removal of stones. (OPEN PROCUDURE)  . REVERSE SHOULDER ARTHROPLASTY  10/09/2011   Procedure: REVERSE SHOULDER ARTHROPLASTY;  Surgeon: Marin Shutter, MD;  Location: [facility];  Service: Orthopedics;  Laterality: Right;  right hardware removal and reverse shoulder arthroplasty  . THYROID LOBECTOMY  1967   goiter removed  . TONSILLECTOMY    . TOTAL KNEE ARTHROPLASTY  07/22/2012   Procedure: TOTAL KNEE ARTHROPLASTY;  Surgeon: Johnn Hai, MD;  Location: [facility];  Service: Orthopedics;  Laterality: Right;  . TOTAL KNEE ARTHROPLASTY Left 12/16/2012   Procedure: LEFT TOTAL KNEE ARTHROPLASTY;  Surgeon: Johnn Hai, MD;  Location: [facility];  Service:  Orthopedics;  Laterality: Left;  . TRANSTHORACIC ECHOCARDIOGRAM  03/25/2017   mild LVH, ef 55-60%/  mild LAE/ trivial PR/  mild MR  . TRIGGER FINGER RELEASE Right 2006  . [place] EXTRACTION  2002        Home Medications    Prior to Admission medications   Medication Sig Start Date End Date Taking? Authorizing Provider  acetaminophen (TYLENOL) 500 MG tablet Take by mouth every 6 (six) hours as needed for mild pain, moderate pain, fever or headache.     [provider]  allopurinol (ZYLOPRIM) 100 MG tablet Take 100 mg by mouth every morning.     [provider]  cholecalciferol (VITAMIN D) 1000 UNITS tablet Take 1,000 Units by mouth daily.    [provider]  diltiazem (CARDIZEM CD) 240 MG 24 hr capsule Take 1 capsule (240 mg total) by mouth every morning. Patient needs appointment 06/18/18   Minus Breeding, MD  galantamine (RAZADYNE) 12 MG tablet Take 1 tablet (12 mg total) by mouth 2 (two) times daily. 09/01/18   Cameron Sprang, MD  HYDROcodone-acetaminophen (NORCO/VICODIN) 5-325 MG tablet Take 1 tablet by mouth every 6 (six) hours as needed for moderate pain.    [provider]  hyoscyamine (ANASPAZ) 0.125 MG TBDP disintergrating tablet Place 0.125 mg under the tongue every 6 (six) hours as needed for cramping.    [provider]  indapamide (LOZOL) 2.5 MG tablet Take 2.5 mg by mouth every evening.     [provider]  loperamide (IMODIUM) 2 MG capsule Take 1-2 mg by mouth as needed.    [provider]  loratadine (CLARITIN) 10 MG tablet Take 10 mg by mouth every morning.     [provider]  pantoprazole (PROTONIX) 40 MG tablet Take 1 tablet by mouth daily. 07/24/17   [provider]  polyethylene glycol (MIRALAX / GLYCOLAX) packet Take 17 g by mouth daily. Patient taking differently: Take 17 g by mouth daily as needed.  03/27/17   Lavina Hamman, MD  potassium chloride SA (K-DUR,KLOR-CON) 20 MEQ  tablet Take 20 mEq by mouth 2 (two) times daily.    [provider]  saccharomyces boulardii (FLORASTOR) 250 MG capsule Take 1 capsule (250 mg total) by mouth 2 (two) times daily. 04/22/17   Erlene Quan, PA-C  tolterodine (DETROL LA) 4 MG 24 hr capsule Take by mouth daily. 08/18/18   [provider]  vitamin B-12 (CYANOCOBALAMIN) 1000 MCG tablet Take 1,000 mcg by mouth every morning.     [provider]    Family History Family History  Problem Relation Age of Onset  . Dementia Mother   . Dementia Father   . Anesthesia problems Neg Hx   . Hypotension Neg Hx   . Malignant hyperthermia Neg Hx   . Pseudochol deficiency Neg Hx     Social History Social History   Tobacco Use  . Smoking status: Never Smoker  . Smokeless tobacco: Never Used  Substance Use Topics  . Alcohol use: No  .  Drug use: No     Allergies   Naprosyn [naproxen]   Review of Systems Review of Systems  All other systems negative except as documented in the HPI. All pertinent positives and negatives as reviewed in the HPI. Physical Exam Updated Vital Signs BP (!) 143/66   Pulse (!) 54   Temp 98.9 F (37.2 C) (Oral)   Resp 16   Ht 5\' 9"  (1.753 m)   Wt 88.5 kg   SpO2 98%   BMI 28.80 kg/m   Physical Exam Vitals signs and nursing note reviewed.  Constitutional:      General: He is not in acute distress.    Appearance: He is well-developed.  HENT:     Head: Normocephalic and atraumatic.  Eyes:     Pupils: Pupils are equal, round, and reactive to light.  Neck:     Musculoskeletal: Normal range of motion and neck supple.  Cardiovascular:     Rate and Rhythm: Normal rate and regular rhythm.     Heart sounds: Normal heart sounds. No murmur. No friction rub. No gallop.   Pulmonary:     Effort: Pulmonary effort is normal. No respiratory distress.     Breath sounds: Normal breath sounds. No wheezing.  Skin:    General: Skin is warm and dry.     Capillary Refill: Capillary  refill takes less than 2 seconds.     Findings: No erythema or rash.  Neurological:     Mental Status: He is alert and oriented to person, place, and time.     Sensory: No sensory deficit.     Motor: No abnormal muscle tone.     Coordination: Coordination normal.     Gait: Gait normal.  Psychiatric:        Behavior: Behavior normal.      ED Treatments / Results  Labs (all labs ordered are listed, but only abnormal results are displayed) Labs Reviewed - No data to display  EKG None  Radiology No results found.  Procedures Procedures (including critical care time)  Medications Ordered in ED Medications - No data to display   Initial Impression / Assessment and Plan / ED Course  I have reviewed the triage vital signs and the nursing notes.  Pertinent labs & imaging results that were available during my care of the patient were reviewed by me and considered in my medical decision making (see chart for details).        Patient has no injuries noted on CT scan.  The patient is advised to return here as needed patient is advised to follow-up with his primary doctor.  Patient has no neurological deficits noted on exam. Final Clinical Impressions(s) / ED Diagnoses   Final diagnoses:  Fall, initial encounter  Injury of head, initial encounter    ED Discharge Orders    None       Dalia Heading, PA-C 03/10/19 [place], [place], DO 03/14/19 [phone]

## 2019-03-14 DIAGNOSIS — M25561 Pain in right knee: Secondary | ICD-10-CM | POA: Diagnosis not present

## 2019-03-14 DIAGNOSIS — M25361 Other instability, right knee: Secondary | ICD-10-CM | POA: Diagnosis not present

## 2019-03-14 DIAGNOSIS — Z96651 Presence of right artificial knee joint: Secondary | ICD-10-CM | POA: Diagnosis not present

## 2019-03-21 DIAGNOSIS — G4733 Obstructive sleep apnea (adult) (pediatric): Secondary | ICD-10-CM | POA: Diagnosis not present

## 2019-03-31 ENCOUNTER — Other Ambulatory Visit: Payer: Self-pay

## 2019-03-31 MED ORDER — GALANTAMINE HYDROBROMIDE 12 MG PO TABS: 12.0000 mg | ORAL_TABLET | Freq: Two times a day (BID) | ORAL | 3 refills | Status: DC

## 2019-04-04 DIAGNOSIS — M25561 Pain in right knee: Secondary | ICD-10-CM | POA: Diagnosis not present

## 2019-04-07 ENCOUNTER — Telehealth (INDEPENDENT_AMBULATORY_CARE_PROVIDER_SITE_OTHER): Payer: PPO | Admitting: Neurology

## 2019-04-07 ENCOUNTER — Encounter: Payer: Self-pay | Admitting: Neurology

## 2019-04-07 ENCOUNTER — Other Ambulatory Visit: Payer: Self-pay

## 2019-04-07 VITALS — Ht 70.0 in | Wt 190.0 lb

## 2019-04-07 DIAGNOSIS — F0390 Unspecified dementia without behavioral disturbance: Secondary | ICD-10-CM

## 2019-04-07 DIAGNOSIS — F03A0 Unspecified dementia, mild, without behavioral disturbance, psychotic disturbance, mood disturbance, and anxiety: Secondary | ICD-10-CM

## 2019-04-07 MED ORDER — GALANTAMINE HYDROBROMIDE 12 MG PO TABS: 12.0000 mg | ORAL_TABLET | Freq: Two times a day (BID) | ORAL | 3 refills | Status: DC

## 2019-04-07 NOTE — Progress Notes (Signed)
Virtual Visit via Telephone Note The purpose of this virtual visit is to provide medical care while limiting exposure to the novel coronavirus.    Consent was obtained for phone visit:  Yes.   Answered questions that patient had about telehealth interaction:  Yes.   I discussed the limitations, risks, security and privacy concerns of performing an evaluation and management service by telephone. I also discussed with the patient that there may be a patient responsible charge related to this service. The patient expressed understanding and agreed to proceed.  Pt location: Home Physician Location: office Name of referring provider:  Lavone Orn, MD I connected with .Darrell Perez at patients initiation/request on 04/07/2019 at  8:30 AM EDT by telephone and verified that I am speaking with the correct person using two identifiers.  Pt MRN:  [id number] Pt DOB:  1940-12-25   History of Present Illness:  The patient had a telephone visit on 04/07/2019. He was last seen in the neurology clinic 7 months ago for mild dementia. His wife is present during the visit to provide additional information. [place] 18/30 in March 2020. He states his memory is good, "but others don't think so." His wife reports it has slipped a little since his last visit, mostly asking the same questions about the plans for the day. He continues to manage his own medications and a couple of times has switched AM/PM but overall doing well per wife. He has not been driving due to a right knee injury, but denied getting lost driving short distances previously. His wife manages finances. He has noticed he gets a little more frustrated but mood is good. His wife denies any significant mood issues, no paranoia or hallucinations. No hygiene concerns, he is independent with dressing and bathing. He denies any headaches, dizziness, vision changes. He has back pain and right knee pain when standing for long periods of time. His wife reports  several falls, most recently 4 weeks ago when he missed the bottom step and fell face down, injuring his right knee. He did balance therapy at the beginning of the year and relies on his cane quite a bit, but did not have his cane with the last fall. Sleep is overall good, he takes naps occasionally. He is on galantamine 12mg  BID without side effects.   History on Initial Assessment: This is a pleasant 78 year old right-handed man with a history of hypertension, goiter s/p thyroid lobectomy, atrial fibrillation, dementia, presenting to establish care. He feels his memory is pretty good. His wife started noticing memory changes around 5 years ago where he was getting more forgetful, but worse in the past 1-2 years. He had sepsis in the Fall and things seemed to accelerate then. He had more difficulties initially when he got home, but has acclimated since then. His wife has taken over most of the driving after he made her nervous trying to turn left onto oncoming traffic 1.5 years ago and did not seem to comprehend this was difficult. He continues to manage his own medications. There have been a few bills that he has let go too long without paying, which is unusual. He has word-finding difficulties. His wife denies any personality changes, no paranoia or hallucinations. He had abnormal dreams on Aricept and is taking Galantamine, however this is quite costly for their budget and his wife wonders if there is any true benefit from it. No side effects on galantamine.  He has low back pain and both  legs feel weak. Otherwise he denies any headaches, dizziness, vision changes, dysarthria/dysphagia, neck pain, focal numbness/tingling, bowel/bladder dysfunction, anosmia, or tremors. Sleep is good. No falls. Both parents had dementia. He denies any history of significant head injuries or alcohol use.   I personally reviewed MRI brain with and without contrast done 10/2015 which did not show any acute changes. There was  mild dilatation of the lateral ventricles with sparing of the temporal horns, mild diffuse atrophy and chronic microvascular disease.  Laboratory Data: TSH 03/2017 normal 1.13   Current Outpatient Medications on File Prior to Visit  Medication Sig Dispense Refill   acetaminophen (TYLENOL) 500 MG tablet Take by mouth every 6 (six) hours as needed for mild pain, moderate pain, fever or headache.      allopurinol (ZYLOPRIM) 100 MG tablet Take 100 mg by mouth every morning.      cholecalciferol (VITAMIN D) 1000 UNITS tablet Take 1,000 Units by mouth daily.     diltiazem (CARDIZEM CD) 240 MG 24 hr capsule Take 1 capsule (240 mg total) by mouth every morning. Patient needs appointment 90 capsule 0   galantamine (RAZADYNE) 12 MG tablet Take 1 tablet (12 mg total) by mouth 2 (two) times daily. 180 tablet 3   HYDROcodone-acetaminophen (NORCO/VICODIN) 5-325 MG tablet Take 1 tablet by mouth every 6 (six) hours as needed for moderate pain.     hyoscyamine (ANASPAZ) 0.125 MG TBDP disintergrating tablet Place 0.125 mg under the tongue every 6 (six) hours as needed for cramping.     indapamide (LOZOL) 2.5 MG tablet Take 2.5 mg by mouth every evening.      loperamide (IMODIUM) 2 MG capsule Take 1-2 mg by mouth as needed.     loratadine (CLARITIN) 10 MG tablet Take 10 mg by mouth every morning.      pantoprazole (PROTONIX) 40 MG tablet Take 1 tablet by mouth daily.     polyethylene glycol (MIRALAX / GLYCOLAX) packet Take 17 g by mouth daily. (Patient taking differently: Take 17 g by mouth daily as needed. ) 14 each 0   potassium chloride SA (K-DUR,KLOR-CON) 20 MEQ tablet Take 20 mEq by mouth 2 (two) times daily.     saccharomyces boulardii (FLORASTOR) 250 MG capsule Take 1 capsule (250 mg total) by mouth 2 (two) times daily. 20 capsule 3   tolterodine (DETROL LA) 4 MG 24 hr capsule Take by mouth daily.     vitamin B-12 (CYANOCOBALAMIN) 1000 MCG tablet Take 1,000 mcg by mouth every morning.        No current facility-administered medications on file prior to visit.       Observations/Objective:  Limited due to nature of phone visit. Patient is awake, alert, oriented x 3.  Montreal Cognitive Assessment Blind 04/07/2019   Attention: Read list of digits (0/2) 2   Attention: Read list of letters (0/1) 1   Attention: Serial 7 subtraction starting at 100 (0/3) 2   Language: Repeat phrase (0/2) 1   Language : Fluency (0/1) 1   Abstraction (0/2) 2   Delayed Recall (0/5) 1   Orientation (0/6) 5   Total 15     Assessment and Plan:   This is a pleasant 78 yo RH man with a history of  hypertension, goiter s/p thyroid lobectomy, atrial fibrillation, with mild dementia, likely due to Alzheimer's disease. His MOCA blind score (done over phone) today is 15/22 (18/30 in March 2020, MMSE 28/30 in July 2019). Overall symptoms stable, continue galantamine 12mg  BID. He  had side effects on Donepezil and Memantine. No significant behavioral or sleep issues. Continue to monitor driving. We again discussed the importance of control of vascular risk factors, physical exercise, and brain stimulation exercises. Follow-up in 6 months, they know to call for any changes    Follow Up Instructions:   -I discussed the assessment and treatment plan with the patient. The patient was provided an opportunity to ask questions and all were answered. The patient agreed with the plan and demonstrated an understanding of the instructions.   The patient was advised to call back or seek an in-person evaluation if the symptoms worsen or if the condition fails to improve as anticipated.    Total Time spent in visit with the patient was:  13 minutes, of which 100% of the time was spent in counseling and/or coordinating care on the above.   Pt understands and agrees with the plan of care outlined.     Cameron Sprang, MD

## 2019-04-20 DIAGNOSIS — G4733 Obstructive sleep apnea (adult) (pediatric): Secondary | ICD-10-CM | POA: Diagnosis not present

## 2019-04-21 DIAGNOSIS — L298 Other pruritus: Secondary | ICD-10-CM | POA: Diagnosis not present

## 2019-04-21 DIAGNOSIS — L820 Inflamed seborrheic keratosis: Secondary | ICD-10-CM | POA: Diagnosis not present

## 2019-04-21 DIAGNOSIS — D485 Neoplasm of uncertain behavior of skin: Secondary | ICD-10-CM | POA: Diagnosis not present

## 2019-04-25 DIAGNOSIS — Z96651 Presence of right artificial knee joint: Secondary | ICD-10-CM | POA: Diagnosis not present

## 2019-04-25 DIAGNOSIS — M25561 Pain in right knee: Secondary | ICD-10-CM | POA: Diagnosis not present

## 2019-04-25 DIAGNOSIS — M4187 Other forms of scoliosis, lumbosacral region: Secondary | ICD-10-CM | POA: Diagnosis not present

## 2019-05-12 DIAGNOSIS — H029 Unspecified disorder of eyelid: Secondary | ICD-10-CM | POA: Diagnosis not present

## 2019-05-12 DIAGNOSIS — K219 Gastro-esophageal reflux disease without esophagitis: Secondary | ICD-10-CM | POA: Diagnosis not present

## 2019-05-12 DIAGNOSIS — F0390 Unspecified dementia without behavioral disturbance: Secondary | ICD-10-CM | POA: Diagnosis not present

## 2019-05-12 DIAGNOSIS — I10 Essential (primary) hypertension: Secondary | ICD-10-CM | POA: Diagnosis not present

## 2019-05-12 DIAGNOSIS — M1000 Idiopathic gout, unspecified site: Secondary | ICD-10-CM | POA: Diagnosis not present

## 2019-05-12 DIAGNOSIS — M25561 Pain in right knee: Secondary | ICD-10-CM | POA: Insufficient documentation

## 2019-05-16 DIAGNOSIS — S82001D Unspecified fracture of right patella, subsequent encounter for closed fracture with routine healing: Secondary | ICD-10-CM | POA: Diagnosis not present

## 2019-05-16 DIAGNOSIS — M25561 Pain in right knee: Secondary | ICD-10-CM | POA: Diagnosis not present

## 2019-05-21 DIAGNOSIS — G4733 Obstructive sleep apnea (adult) (pediatric): Secondary | ICD-10-CM | POA: Diagnosis not present

## 2019-06-06 DIAGNOSIS — S82001K Unspecified fracture of right patella, subsequent encounter for closed fracture with nonunion: Secondary | ICD-10-CM | POA: Diagnosis not present

## 2019-06-06 DIAGNOSIS — S82009A Unspecified fracture of unspecified patella, initial encounter for closed fracture: Secondary | ICD-10-CM | POA: Insufficient documentation

## 2019-06-06 DIAGNOSIS — M25561 Pain in right knee: Secondary | ICD-10-CM | POA: Diagnosis not present

## 2019-06-10 DIAGNOSIS — G4733 Obstructive sleep apnea (adult) (pediatric): Secondary | ICD-10-CM | POA: Diagnosis not present

## 2019-07-04 DIAGNOSIS — S82001K Unspecified fracture of right patella, subsequent encounter for closed fracture with nonunion: Secondary | ICD-10-CM | POA: Diagnosis not present

## 2019-07-05 DIAGNOSIS — H02834 Dermatochalasis of left upper eyelid: Secondary | ICD-10-CM | POA: Diagnosis not present

## 2019-07-05 DIAGNOSIS — H02835 Dermatochalasis of left lower eyelid: Secondary | ICD-10-CM | POA: Diagnosis not present

## 2019-07-05 DIAGNOSIS — H02832 Dermatochalasis of right lower eyelid: Secondary | ICD-10-CM | POA: Diagnosis not present

## 2019-07-05 DIAGNOSIS — H57813 Brow ptosis, bilateral: Secondary | ICD-10-CM | POA: Diagnosis not present

## 2019-07-05 DIAGNOSIS — H02831 Dermatochalasis of right upper eyelid: Secondary | ICD-10-CM | POA: Diagnosis not present

## 2019-07-05 DIAGNOSIS — H02411 Mechanical ptosis of right eyelid: Secondary | ICD-10-CM | POA: Diagnosis not present

## 2019-07-05 DIAGNOSIS — D485 Neoplasm of uncertain behavior of skin: Secondary | ICD-10-CM | POA: Diagnosis not present

## 2019-07-11 DIAGNOSIS — R351 Nocturia: Secondary | ICD-10-CM | POA: Diagnosis not present

## 2019-07-11 DIAGNOSIS — N401 Enlarged prostate with lower urinary tract symptoms: Secondary | ICD-10-CM | POA: Diagnosis not present

## 2019-07-11 DIAGNOSIS — R3912 Poor urinary stream: Secondary | ICD-10-CM | POA: Diagnosis not present

## 2019-07-11 DIAGNOSIS — N200 Calculus of kidney: Secondary | ICD-10-CM | POA: Diagnosis not present

## 2019-07-14 DIAGNOSIS — S82001K Unspecified fracture of right patella, subsequent encounter for closed fracture with nonunion: Secondary | ICD-10-CM | POA: Diagnosis not present

## 2019-08-02 ENCOUNTER — Other Ambulatory Visit: Payer: Self-pay | Admitting: Ophthalmology

## 2019-08-02 DIAGNOSIS — H57813 Brow ptosis, bilateral: Secondary | ICD-10-CM | POA: Diagnosis not present

## 2019-08-02 DIAGNOSIS — H02411 Mechanical ptosis of right eyelid: Secondary | ICD-10-CM | POA: Diagnosis not present

## 2019-08-02 DIAGNOSIS — H02835 Dermatochalasis of left lower eyelid: Secondary | ICD-10-CM | POA: Diagnosis not present

## 2019-08-02 DIAGNOSIS — D487 Neoplasm of uncertain behavior of other specified sites: Secondary | ICD-10-CM | POA: Diagnosis not present

## 2019-08-02 DIAGNOSIS — D485 Neoplasm of uncertain behavior of skin: Secondary | ICD-10-CM | POA: Diagnosis not present

## 2019-08-02 DIAGNOSIS — H02832 Dermatochalasis of right lower eyelid: Secondary | ICD-10-CM | POA: Diagnosis not present

## 2019-08-02 DIAGNOSIS — H02834 Dermatochalasis of left upper eyelid: Secondary | ICD-10-CM | POA: Diagnosis not present

## 2019-08-02 DIAGNOSIS — H02831 Dermatochalasis of right upper eyelid: Secondary | ICD-10-CM | POA: Diagnosis not present

## 2019-08-02 DIAGNOSIS — L570 Actinic keratosis: Secondary | ICD-10-CM | POA: Diagnosis not present

## 2019-08-10 DIAGNOSIS — F0390 Unspecified dementia without behavioral disturbance: Secondary | ICD-10-CM | POA: Diagnosis not present

## 2019-08-10 DIAGNOSIS — I10 Essential (primary) hypertension: Secondary | ICD-10-CM | POA: Diagnosis not present

## 2019-08-10 DIAGNOSIS — I480 Paroxysmal atrial fibrillation: Secondary | ICD-10-CM | POA: Diagnosis not present

## 2019-08-14 ENCOUNTER — Ambulatory Visit: Payer: PPO | Attending: Internal Medicine

## 2019-08-14 DIAGNOSIS — Z23 Encounter for immunization: Secondary | ICD-10-CM | POA: Insufficient documentation

## 2019-08-14 NOTE — Progress Notes (Signed)
   Covid-19 Vaccination Clinic  Name:  Darrell Perez    MRN: [id number] DOB: June 07, 1941  08/14/2019  Mr. Darrell Perez was observed post Covid-19 immunization for 15 minutes without incidence. He was provided with Vaccine Information Sheet and instruction to access the V-Safe system.   Mr. Darrell Perez was instructed to call 911 with any severe reactions post vaccine: [name] Difficulty breathing  . Swelling of your face and throat  . A fast heartbeat  . A bad rash all over your body  . Dizziness and weakness    Immunizations Administered    Name Date Dose VIS Date Route   Pfizer COVID-19 Vaccine 08/14/2019 12:32 PM 0.3 mL 06/10/2019 Intramuscular   Manufacturer: [place]   Lot: [id number]   [place]: [id number]

## 2019-08-15 DIAGNOSIS — S82001K Unspecified fracture of right patella, subsequent encounter for closed fracture with nonunion: Secondary | ICD-10-CM | POA: Diagnosis not present

## 2019-08-17 DIAGNOSIS — R350 Frequency of micturition: Secondary | ICD-10-CM | POA: Diagnosis not present

## 2019-08-17 DIAGNOSIS — R300 Dysuria: Secondary | ICD-10-CM | POA: Diagnosis not present

## 2019-08-17 DIAGNOSIS — N401 Enlarged prostate with lower urinary tract symptoms: Secondary | ICD-10-CM | POA: Diagnosis not present

## 2019-09-06 ENCOUNTER — Ambulatory Visit: Payer: PPO | Attending: Internal Medicine

## 2019-09-06 DIAGNOSIS — Z23 Encounter for immunization: Secondary | ICD-10-CM | POA: Insufficient documentation

## 2019-09-06 NOTE — Progress Notes (Signed)
   Covid-19 Vaccination Clinic  Name:  Darrell Perez    MRN: [id number] DOB: 02-14-1941  09/06/2019  [name] was observed post Covid-19 immunization for 15 minutes without incident. He was provided with Vaccine Information Sheet and instruction to access the V-Safe system.   [name] was instructed to call 911 with any severe reactions post vaccine: [name] Difficulty breathing  . Swelling of face and throat  . A fast heartbeat  . A bad rash all over body  . Dizziness and weakness   Immunizations Administered    Name Date Dose VIS Date Route   Pfizer COVID-19 Vaccine 09/06/2019  5:13 PM 0.3 mL 06/10/2019 Intramuscular   Manufacturer: [place]   Lot: [id number]   [place]: [id number]

## 2019-09-20 DIAGNOSIS — N200 Calculus of kidney: Secondary | ICD-10-CM | POA: Diagnosis not present

## 2019-09-20 DIAGNOSIS — R1084 Generalized abdominal pain: Secondary | ICD-10-CM | POA: Diagnosis not present

## 2019-09-20 DIAGNOSIS — R8279 Other abnormal findings on microbiological examination of urine: Secondary | ICD-10-CM | POA: Diagnosis not present

## 2019-09-28 DIAGNOSIS — M545 Low back pain: Secondary | ICD-10-CM | POA: Diagnosis not present

## 2019-09-28 DIAGNOSIS — N200 Calculus of kidney: Secondary | ICD-10-CM | POA: Diagnosis not present

## 2019-09-28 DIAGNOSIS — N3000 Acute cystitis without hematuria: Secondary | ICD-10-CM | POA: Diagnosis not present

## 2019-10-12 DIAGNOSIS — R300 Dysuria: Secondary | ICD-10-CM | POA: Diagnosis not present

## 2019-10-12 DIAGNOSIS — R3915 Urgency of urination: Secondary | ICD-10-CM | POA: Diagnosis not present

## 2019-10-12 DIAGNOSIS — N200 Calculus of kidney: Secondary | ICD-10-CM | POA: Diagnosis not present

## 2019-10-12 DIAGNOSIS — N401 Enlarged prostate with lower urinary tract symptoms: Secondary | ICD-10-CM | POA: Diagnosis not present

## 2019-10-20 DIAGNOSIS — L819 Disorder of pigmentation, unspecified: Secondary | ICD-10-CM | POA: Diagnosis not present

## 2019-10-20 DIAGNOSIS — L570 Actinic keratosis: Secondary | ICD-10-CM | POA: Diagnosis not present

## 2019-10-20 DIAGNOSIS — D485 Neoplasm of uncertain behavior of skin: Secondary | ICD-10-CM | POA: Diagnosis not present

## 2019-10-20 DIAGNOSIS — L814 Other melanin hyperpigmentation: Secondary | ICD-10-CM | POA: Diagnosis not present

## 2019-10-20 DIAGNOSIS — L853 Xerosis cutis: Secondary | ICD-10-CM | POA: Diagnosis not present

## 2019-10-20 DIAGNOSIS — D229 Melanocytic nevi, unspecified: Secondary | ICD-10-CM | POA: Diagnosis not present

## 2019-10-20 DIAGNOSIS — L820 Inflamed seborrheic keratosis: Secondary | ICD-10-CM | POA: Diagnosis not present

## 2019-10-20 DIAGNOSIS — L821 Other seborrheic keratosis: Secondary | ICD-10-CM | POA: Diagnosis not present

## 2019-11-01 DIAGNOSIS — G4733 Obstructive sleep apnea (adult) (pediatric): Secondary | ICD-10-CM | POA: Diagnosis not present

## 2019-11-01 DIAGNOSIS — I10 Essential (primary) hypertension: Secondary | ICD-10-CM | POA: Diagnosis not present

## 2019-11-01 DIAGNOSIS — M170 Bilateral primary osteoarthritis of knee: Secondary | ICD-10-CM | POA: Diagnosis not present

## 2019-11-01 DIAGNOSIS — K591 Functional diarrhea: Secondary | ICD-10-CM | POA: Diagnosis not present

## 2019-11-01 DIAGNOSIS — Z1389 Encounter for screening for other disorder: Secondary | ICD-10-CM | POA: Diagnosis not present

## 2019-11-01 DIAGNOSIS — Z0000 Encounter for general adult medical examination without abnormal findings: Secondary | ICD-10-CM | POA: Diagnosis not present

## 2019-11-01 DIAGNOSIS — M10072 Idiopathic gout, left ankle and foot: Secondary | ICD-10-CM | POA: Diagnosis not present

## 2019-11-01 DIAGNOSIS — F0390 Unspecified dementia without behavioral disturbance: Secondary | ICD-10-CM | POA: Diagnosis not present

## 2019-11-01 DIAGNOSIS — Z8679 Personal history of other diseases of the circulatory system: Secondary | ICD-10-CM | POA: Diagnosis not present

## 2019-11-01 DIAGNOSIS — N400 Enlarged prostate without lower urinary tract symptoms: Secondary | ICD-10-CM | POA: Diagnosis not present

## 2019-11-11 ENCOUNTER — Other Ambulatory Visit: Payer: Self-pay

## 2019-11-11 ENCOUNTER — Encounter: Payer: Self-pay | Admitting: Neurology

## 2019-11-11 ENCOUNTER — Telehealth (INDEPENDENT_AMBULATORY_CARE_PROVIDER_SITE_OTHER): Payer: PPO | Admitting: Neurology

## 2019-11-11 VITALS — Ht 69.0 in | Wt 210.0 lb

## 2019-11-11 DIAGNOSIS — F0390 Unspecified dementia without behavioral disturbance: Secondary | ICD-10-CM | POA: Diagnosis not present

## 2019-11-11 DIAGNOSIS — F03A0 Unspecified dementia, mild, without behavioral disturbance, psychotic disturbance, mood disturbance, and anxiety: Secondary | ICD-10-CM

## 2019-11-11 MED ORDER — GALANTAMINE HYDROBROMIDE 12 MG PO TABS: 12.0000 mg | ORAL_TABLET | Freq: Two times a day (BID) | ORAL | 3 refills | Status: DC

## 2019-11-11 NOTE — Progress Notes (Signed)
Virtual Visit via Video Note The purpose of this virtual visit is to provide medical care while limiting exposure to the novel coronavirus.    Consent was obtained for video visit:  Yes.   Answered questions that patient had about telehealth interaction:  Yes.   I discussed the limitations, risks, security and privacy concerns of performing an evaluation and management service by telemedicine. I also discussed with the patient that there may be a patient responsible charge related to this service. The patient expressed understanding and agreed to proceed.  Pt location: Home Physician Location: office Name of referring provider:  Lavone Orn, MD I connected with Darrell Perez at patients initiation/request on 11/11/2019 at 10:00 AM EDT by video enabled telemedicine application and verified that I am speaking with the correct person using two identifiers. Pt MRN:  [id number] Pt DOB:  01/04/1941 Video Participants:  Darrell Perez;  Shon Baton (spouse)   History of Present Illness:  The patient had a virtual video visit on 11/11/2019. He was last evaluated in the neurology clinic via telephonic communication 7 months ago for mild dementia. MOCA blind (done over phone )15/22 in 03/2019. His wife is present during the e-visit to provide additional information. Since his last visit, he feels his memory is okay. His wife has noticed it has slipped a little, she has noticed some things, but not bad. She does majority of the driving, he drives very little and denies getting lost when he does drive. He continues to manage his own medications, his wife checks behind him and notes he does well. His wife manages finances. He is independent with dressing and bathing. His wife was previously reporting some slight irritability, this has not significantly worsened, no paranoia or hallucinations. Mood is good. He is sleeping well. He is on galantamine 12mg  BID without side effects. He had side effects on  Donepezil and Memantine in the past. He denies any headaches, dizziness. He has numbness in his feet. He had a fall and his knee is still bothering him. He is getting new hearing aids.    History on Initial Assessment: This is a pleasant 79 year old right-handed man with a history of hypertension, goiter s/p thyroid lobectomy, atrial fibrillation, dementia, presenting to establish care. He feels his memory is pretty good. His wife started noticing memory changes around 5 years ago where he was getting more forgetful, but worse in the past 1-2 years. He had sepsis in the Fall and things seemed to accelerate then. He had more difficulties initially when he got home, but has acclimated since then. His wife has taken over most of the driving after he made her nervous trying to turn left onto oncoming traffic 1.5 years ago and did not seem to comprehend this was difficult. He continues to manage his own medications. There have been a few bills that he has let go too long without paying, which is unusual. He has word-finding difficulties. His wife denies any personality changes, no paranoia or hallucinations. He had abnormal dreams on Aricept and is taking Galantamine, however this is quite costly for their budget and his wife wonders if there is any true benefit from it. No side effects on galantamine.  He has low back pain and both legs feel weak. Otherwise he denies any headaches, dizziness, vision changes, dysarthria/dysphagia, neck pain, focal numbness/tingling, bowel/bladder dysfunction, anosmia, or tremors. Sleep is good. No falls. Both parents had dementia. He denies any history of significant head injuries or alcohol  use.   I personally reviewed MRI brain with and without contrast done 10/2015 which did not show any acute changes. There was mild dilatation of the lateral ventricles with sparing of the temporal horns, mild diffuse atrophy and chronic microvascular disease.  Laboratory Data: TSH 03/2017  normal 1.13   Current Outpatient Medications on File Prior to Visit  Medication Sig Dispense Refill  . acetaminophen (TYLENOL) 500 MG tablet Take by mouth every 6 (six) hours as needed for mild pain, moderate pain, fever or headache.     . allopurinol (ZYLOPRIM) 100 MG tablet Take 100 mg by mouth every morning.     . cholecalciferol (VITAMIN D) 1000 UNITS tablet Take 1,000 Units by mouth daily.    [name] diltiazem (CARDIZEM CD) 240 MG 24 hr capsule Take 1 capsule (240 mg total) by mouth every morning. Patient needs appointment 90 capsule 0  . finasteride (PROSCAR) 5 MG tablet Take 5 mg by mouth daily.    [name] galantamine (RAZADYNE) 12 MG tablet Take 1 tablet (12 mg total) by mouth 2 (two) times daily. 180 tablet 3  . HYDROcodone-acetaminophen (NORCO/VICODIN) 5-325 MG tablet Take 1 tablet by mouth every 6 (six) hours as needed for moderate pain.    . hyoscyamine (ANASPAZ) 0.125 MG TBDP disintergrating tablet Place 0.125 mg under the tongue every 6 (six) hours as needed for cramping.    . indapamide (LOZOL) 2.5 MG tablet Take 2.5 mg by mouth every evening.     . loperamide (IMODIUM) 2 MG capsule Take 1-2 mg by mouth as needed.    . loratadine (CLARITIN) 10 MG tablet Take 10 mg by mouth every morning.     . pantoprazole (PROTONIX) 40 MG tablet Take 1 tablet by mouth daily.    . polyethylene glycol (MIRALAX / GLYCOLAX) packet Take 17 g by mouth daily. (Patient taking differently: Take 17 g by mouth daily as needed. ) 14 each 0  . potassium chloride SA (K-DUR,KLOR-CON) 20 MEQ tablet Take 20 mEq by mouth 2 (two) times daily.    [name] tolterodine (DETROL LA) 4 MG 24 hr capsule Take by mouth daily.    . vitamin B-12 (CYANOCOBALAMIN) 1000 MCG tablet Take 1,000 mcg by mouth every morning.      No current facility-administered medications on file prior to visit.     Observations/Objective:   Vitals:   11/11/19 0934  Weight: 210 lb (95.3 kg)  Height: 5\' 9"  (1.753 m)   GEN:  The patient appears stated age and  is in NAD.  Neurological examination: Patient is awake, alert, oriented x 3. No aphasia or dysarthria. Intact fluency and comprehension. Remote and recent memory intact. 2/3 delayed recall. Able to name and repeat. 4/5 WORLD backwards, 5/5 serial 7s. Cranial nerves: Extraocular movements intact with no nystagmus. No facial asymmetry. Motor: moves all extremities symmetrically, at least anti-gravity x 4.    Assessment and Plan:   This is a pleasant 79 yo RH man with a history of  hypertension, goiter s/p thyroid lobectomy, atrial fibrillation, with mild dementia, likely due to Alzheimer's disease. His MOCA blind score in 03/2019 was 15/22 (18/30 in March 2020, MMSE 28/30 in July 2019). He continues to overall manage complex tasks fine, continue galantamine 12mg  BID. He had side effects on Donepezil and Memantine in the past. He is not driving much. No significant behavioral or sleep issues. Follow-up in 6 months, they know to call for any changes.    Follow Up Instructions:  -I discussed the assessment  and treatment plan with the patient. The patient was provided an opportunity to ask questions and all were answered. The patient agreed with the plan and demonstrated an understanding of the instructions.   The patient was advised to call back or seek an in-person evaluation if the symptoms worsen or if the condition fails to improve as anticipated.     Cameron Sprang, MD

## 2019-11-22 DIAGNOSIS — H903 Sensorineural hearing loss, bilateral: Secondary | ICD-10-CM | POA: Diagnosis not present

## 2019-12-07 DIAGNOSIS — H903 Sensorineural hearing loss, bilateral: Secondary | ICD-10-CM | POA: Diagnosis not present

## 2019-12-28 DIAGNOSIS — M170 Bilateral primary osteoarthritis of knee: Secondary | ICD-10-CM | POA: Diagnosis not present

## 2019-12-28 DIAGNOSIS — N400 Enlarged prostate without lower urinary tract symptoms: Secondary | ICD-10-CM | POA: Diagnosis not present

## 2019-12-28 DIAGNOSIS — I10 Essential (primary) hypertension: Secondary | ICD-10-CM | POA: Diagnosis not present

## 2019-12-28 DIAGNOSIS — F0390 Unspecified dementia without behavioral disturbance: Secondary | ICD-10-CM | POA: Diagnosis not present

## 2019-12-28 DIAGNOSIS — I480 Paroxysmal atrial fibrillation: Secondary | ICD-10-CM | POA: Diagnosis not present

## 2020-01-05 DIAGNOSIS — K134 Granuloma and granuloma-like lesions of oral mucosa: Secondary | ICD-10-CM | POA: Diagnosis not present

## 2020-01-16 DIAGNOSIS — R351 Nocturia: Secondary | ICD-10-CM | POA: Diagnosis not present

## 2020-01-16 DIAGNOSIS — N401 Enlarged prostate with lower urinary tract symptoms: Secondary | ICD-10-CM | POA: Diagnosis not present

## 2020-01-31 DIAGNOSIS — C44329 Squamous cell carcinoma of skin of other parts of face: Secondary | ICD-10-CM | POA: Diagnosis not present

## 2020-01-31 DIAGNOSIS — D485 Neoplasm of uncertain behavior of skin: Secondary | ICD-10-CM | POA: Diagnosis not present

## 2020-03-12 DIAGNOSIS — M25512 Pain in left shoulder: Secondary | ICD-10-CM | POA: Diagnosis not present

## 2020-03-23 DIAGNOSIS — I10 Essential (primary) hypertension: Secondary | ICD-10-CM | POA: Diagnosis not present

## 2020-03-23 DIAGNOSIS — F0390 Unspecified dementia without behavioral disturbance: Secondary | ICD-10-CM | POA: Diagnosis not present

## 2020-03-23 DIAGNOSIS — I480 Paroxysmal atrial fibrillation: Secondary | ICD-10-CM | POA: Diagnosis not present

## 2020-03-23 DIAGNOSIS — M170 Bilateral primary osteoarthritis of knee: Secondary | ICD-10-CM | POA: Diagnosis not present

## 2020-03-23 DIAGNOSIS — N400 Enlarged prostate without lower urinary tract symptoms: Secondary | ICD-10-CM | POA: Diagnosis not present

## 2020-04-10 DIAGNOSIS — I10 Essential (primary) hypertension: Secondary | ICD-10-CM | POA: Diagnosis not present

## 2020-04-13 ENCOUNTER — Ambulatory Visit: Payer: PPO | Attending: Internal Medicine

## 2020-04-13 DIAGNOSIS — Z23 Encounter for immunization: Secondary | ICD-10-CM

## 2020-04-13 NOTE — Progress Notes (Signed)
   Covid-19 Vaccination Clinic  Name:  Darrell Perez    MRN: [id number] DOB: [birth year]  04/13/2020  Darrell Perez was observed post Covid-19 immunization for 15 minutes without incident. He was provided with Vaccine Information Sheet and instruction to access the V-Safe system.   Darrell Perez was instructed to call 911 with any severe reactions post vaccine: [name] Difficulty breathing  . Swelling of face and throat  . A fast heartbeat  . A bad rash all over body  . Dizziness and weakness

## 2020-04-23 DIAGNOSIS — L989 Disorder of the skin and subcutaneous tissue, unspecified: Secondary | ICD-10-CM | POA: Diagnosis not present

## 2020-04-23 DIAGNOSIS — L905 Scar conditions and fibrosis of skin: Secondary | ICD-10-CM | POA: Diagnosis not present

## 2020-04-23 DIAGNOSIS — L814 Other melanin hyperpigmentation: Secondary | ICD-10-CM | POA: Diagnosis not present

## 2020-04-23 DIAGNOSIS — Z85828 Personal history of other malignant neoplasm of skin: Secondary | ICD-10-CM | POA: Diagnosis not present

## 2020-04-23 DIAGNOSIS — L570 Actinic keratosis: Secondary | ICD-10-CM | POA: Diagnosis not present

## 2020-04-23 DIAGNOSIS — L821 Other seborrheic keratosis: Secondary | ICD-10-CM | POA: Diagnosis not present

## 2020-04-23 DIAGNOSIS — D485 Neoplasm of uncertain behavior of skin: Secondary | ICD-10-CM | POA: Diagnosis not present

## 2020-04-23 DIAGNOSIS — D229 Melanocytic nevi, unspecified: Secondary | ICD-10-CM | POA: Diagnosis not present

## 2020-05-14 DIAGNOSIS — K219 Gastro-esophageal reflux disease without esophagitis: Secondary | ICD-10-CM | POA: Diagnosis not present

## 2020-05-14 DIAGNOSIS — M79605 Pain in left leg: Secondary | ICD-10-CM | POA: Diagnosis not present

## 2020-05-14 DIAGNOSIS — R202 Paresthesia of skin: Secondary | ICD-10-CM | POA: Diagnosis not present

## 2020-05-14 DIAGNOSIS — I10 Essential (primary) hypertension: Secondary | ICD-10-CM | POA: Diagnosis not present

## 2020-05-14 DIAGNOSIS — M1000 Idiopathic gout, unspecified site: Secondary | ICD-10-CM | POA: Diagnosis not present

## 2020-05-14 DIAGNOSIS — R152 Fecal urgency: Secondary | ICD-10-CM | POA: Diagnosis not present

## 2020-05-14 DIAGNOSIS — R269 Unspecified abnormalities of gait and mobility: Secondary | ICD-10-CM | POA: Diagnosis not present

## 2020-05-14 DIAGNOSIS — Z23 Encounter for immunization: Secondary | ICD-10-CM | POA: Diagnosis not present

## 2020-06-06 ENCOUNTER — Ambulatory Visit: Payer: PPO | Attending: Internal Medicine | Admitting: Physical Therapy

## 2020-06-06 ENCOUNTER — Other Ambulatory Visit: Payer: Self-pay

## 2020-06-06 ENCOUNTER — Encounter: Payer: Self-pay | Admitting: Physical Therapy

## 2020-06-06 DIAGNOSIS — R296 Repeated falls: Secondary | ICD-10-CM | POA: Insufficient documentation

## 2020-06-06 DIAGNOSIS — R2689 Other abnormalities of gait and mobility: Secondary | ICD-10-CM | POA: Diagnosis not present

## 2020-06-06 NOTE — Therapy (Signed)
[place], [place], [address] Phone: [phone]   Fax:  [phone]  Physical Therapy Evaluation  Patient Details  Name: Darrell Perez MRN: [id number] Date of Birth: January 20, 1941 Referring Provider (PT): Dr Lavone Orn    Encounter Date: 06/06/2020   PT End of Session - 06/06/20 0857    Visit Number 1    Number of Visits 12    Date for PT Re-Evaluation 08/29/20   Patient will not start until after the new year   Authorization Type health t[employer]    PT Start Time [phone]    PT Stop Time 0930    PT Time Calculation (min) 44 min    Activity Tolerance Patient tolerated treatment well    Behavior During Therapy [facility] for tasks assessed/performed           Past Medical History:  Diagnosis Date  . Arthritis   . GERD (gastroesophageal reflux disease)    watches and NO ISSUES WHEN USING CPAP--  NO MEDS  . Hiatal hernia   . History of asthma   . History of kidney stones   . History of multinodular goiter    s/p  thyroid lobectomy 1967  . Hypertension   . Irritable bowel syndrome   . Left knee DJD   . Left ureteral calculus   . Nephrolithiasis    bilateral non-obstrucive per CT 03-23-2017  . OSA on CPAP    cpap does not know settings   . Paroxysmal atrial fibrillation (HCC) newly dx 03-25-2017 (during hospitlization for urosepsis)   A-fib -- Aflutter w/ RVR -- pt started on cardizem and eliquis--- at discharge in [facility]/  cardiologist-  dr Percival Spanish    Past Surgical History:  Procedure Laterality Date  . CARPAL TUNNEL RELEASE Bilateral [place]   . CATARACT EXTRACTION W/ INTRAOCULAR LENS  IMPLANT, BILATERAL  2005  &  2012  . CYSTOSCOPY WITH STENT PLACEMENT Left 03/23/2017   Procedure: CYSTOSCOPY, LEFT RETROGRADE WITH STENT PLACEMENT;  Surgeon: Lucas Mallow, MD;  Location: [facility];  Service: Urology;  Laterality: Left;  . CYSTOSCOPY/URETEROSCOPY/HOLMIUM LASER/STENT PLACEMENT Left 04/10/2017    Procedure: CYSTOSCOPY/URETEROSCOPY/HOLMIUM LASER/STENT PLACEMENT;  Surgeon: Festus Aloe, MD;  Location: [facility];  Service: Urology;  Laterality: Left;  . EXTRACORPOREAL SHOCK WAVE LITHOTRIPSY     MULTIPLE  . HEMIARTHROPLASTY RIGHT SHOULDER  2007  . HOLMIUM LASER APPLICATION Left 5/39/7673   Procedure: HOLMIUM LASER APPLICATION;  Surgeon: Fredricka Bonine, MD;  Location: [facility];  Service: Urology;  Laterality: Left;  . [place]   removal of stones. (OPEN PROCUDURE)  . REVERSE SHOULDER ARTHROPLASTY  10/09/2011   Procedure: REVERSE SHOULDER ARTHROPLASTY;  Surgeon: Marin Shutter, MD;  Location: [place];  Service: Orthopedics;  Laterality: Right;  right hardware removal and reverse shoulder arthroplasty  . THYROID LOBECTOMY  1967   goiter removed  . TONSILLECTOMY    . TOTAL KNEE ARTHROPLASTY  07/22/2012   Procedure: TOTAL KNEE ARTHROPLASTY;  Surgeon: Johnn Hai, MD;  Location: [facility];  Service: Orthopedics;  Laterality: Right;  . TOTAL KNEE ARTHROPLASTY Left 12/16/2012   Procedure: LEFT TOTAL KNEE ARTHROPLASTY;  Surgeon: Johnn Hai, MD;  Location: [facility];  Service: Orthopedics;  Laterality: Left;  . TRANSTHORACIC ECHOCARDIOGRAM  03/25/2017   mild LVH, ef 55-60%/  mild LAE/ trivial PR/  mild MR  . TRIGGER FINGER RELEASE Right 2006  .  [place] EXTRACTION  2002    There were no vitals filed for this visit.    Subjective Assessment - 06/06/20 0852    Subjective Patient has a several year hisotry of falls. LAst year he fell and broke his knee cap. He has neuropathy in his feet. He feels like he falls for different reasons. He feels on a dialy basis he looses his balance but most of the time he is able tocatch it.    Pertinent History Arthritis: knees and shoulders.    Limitations Standing;Walking    How long can you stand comfortably? can stand for a good amount of time    How long can you walk  comfortably? walks with cane. Has falls at times    Diagnostic tests Nothing pertinant    Patient Stated Goals to have less falls/ better balance    Currently in Pain? No/denies              [facility] PT Assessment - 06/06/20 0001      Assessment   Medical Diagnosis Gait abnormality    Referring Provider (PT) Dr Lavone Orn     Onset Date/Surgical Date --   Several years but getting worse    Hand Dominance --   R   Next MD Visit in a few months     Prior Therapy right knee physcial therapy for a fractured knee cap       Precautions   Precautions Fall    Precaution Comments frequent falls       Balance Screen   Has the patient fallen in the past 6 months Yes    How many times? 5    Has the patient had a decrease in activity level because of a fear of falling?  Yes    Is the patient reluctant to leave their home because of a fear of falling?  Yes      [place] residence    Living Arrangements Spouse/significant other    Home Access Stairs to enter    Entrance Stairs-Number of Steps 4    Entrance Stairs-Rails Right;Left      Prior Function   Level of [place] with community mobility with device    Vocation Retired    Leisure walking       Cognition   Overall Cognitive Status Within Functional Limits for tasks assessed    Attention Focused    Focused Attention Appears intact    Memory Appears intact    Awareness Appears intact    Problem Solving Appears intact      Observation/Other Assessments   Observations Patient is hard of hearing even with hearing aides.       Sensation   Additional Comments Patient reports numbness in both feet. The numbness is worse int he morning.       Coordination   Gross Motor Movements are Fluid and Coordinated No    Fine Motor Movements are Fluid and Coordinated No      Posture/Postural Control   Posture Comments mild forward head       ROM / Strength   AROM / PROM / Strength  AROM;PROM;Strength      AROM   Overall AROM Comments full bilateral ue and LE motion       PROM   Overall PROM Comments mild knee extension limitation; mild limitations in flexion but able to move well past 90 per visual inspection  Strength   Overall Strength Comments right shoulder 4+/5 gorss     Strength Assessment Site Hip;Knee    Right/Left Hip Right;Left    Right Hip Flexion 4/5    Right Hip ABduction 4+/5    Right Hip ADduction 4+/5    Left Hip Flexion 4/5    Left Hip ABduction 4+/5    Right/Left Knee Right;Left    Right Knee Flexion 5/5    Right Knee Extension 4+/5    Left Knee Flexion 5/5    Left Knee Extension 5/5      Palpation   Palpation comment no tendenress to palpation       Transfers   Comments slow transfer from sit to stan. Uses hands       Ambulation/Gait   Gait Comments decreased weight bearing on right side. Lost balance rotating to the left.       Balance   Balance Assessed Yes      Standardized Balance Assessment   Standardized Balance Assessment Timed Up and Go Test;Berg Balance Test      Timed Up and Go Test   TUG Comments 38 seconds       High Level Balance   High Level Balance Comments narrow base eyes open min gaur eyes closed min a tandel left eyes open min gaurd; eyes closed od a R eyes open min gaurd eyes closed mod a       Functional Gait  Assessment   Gait assessed  Yes                      Objective measurements completed on examination: See above findings.       [facility] PT Treatment/Exercise - 06/06/20 0001      Self-Care   Self-Care Other Self-Care Comments    Other Self-Care Comments  Patient reports increased nueropathy in the morning. He has decreased balance with eyes closed. he was advised that low light and increased nuropathy put hinm at a giher risk in the morning and he should be more aware of his fall risk                   PT Education - 06/06/20 0910    Education Details  reviewed HEP, building balance, symptom mangement    Person(s) Educated Patient    Methods Explanation;Demonstration;Tactile cues;Verbal cues    Comprehension Verbalized understanding;Returned demonstration;Verbal cues required;Tactile cues required            PT Short Term Goals - 06/06/20 0947      PT SHORT TERM GOAL #1   Title Patient will decrease TUG time by 8 seconds    Time 3    Period Weeks    Status New    Target Date 06/27/20      PT SHORT TERM GOAL #2   Title Patient will decreased 5x sit to stand time by 6 seconds    Time 3    Period Weeks    Status New    Target Date 06/27/20      PT SHORT TERM GOAL #3   Title Patient will require supervsion for full tandem stance    Time 3    Period Weeks    Status New    Target Date 06/27/20             PT Long Term Goals - 06/06/20 0948      PT LONG TERM GOAL #1   Title Patient will turn  to the left independently without loss of balance    Baseline has difficulty turning to the left    Time 6    Period Weeks    Status New    Target Date 07/18/20      PT LONG TERM GOAL #2   Title Patient will go up down 6 steps independently without lob    Time 6    Period Weeks    Status New    Target Date 07/18/20      PT LONG TERM GOAL #3   Title Patient will be indpednent with balance and gross strengthening program    Time 6    Period Weeks    Status New    Target Date 07/18/20                  Plan - 06/06/20 0910    Clinical Impression Statement Patient is a 79 year old male who presents with frequent fallas and loss of balance. He loses balance for several different reasons. he has an antalgic gait and decreased balance with Rhomberg testing. He has limited strength and mobility of his right knee 2nd to a fall last year with a knee cap fx. He would benefit from skilled therapy to improve baolance and reduce risk of falls.    Personal Factors and Comorbidities Age;Comorbidity 1;Comorbidity 3+;Comorbidity 2     Comorbidities neuropathy, knee OA, Left shoulder OA    Examination-Activity Limitations Squat;Stairs;Locomotion Level    Examination-Participation Restrictions Community Activity;Laundry;Meal Prep;Occupation    Stability/Clinical Decision Making Evolving/Moderate complexity    Clinical Decision Making Moderate    Rehab Potential Good    PT Frequency 1x / week    PT Duration 6 weeks    PT Treatment/Interventions ADLs/Self Care Home Management;Electrical Stimulation;Cryotherapy;Iontophoresis 4mg /ml Dexamethasone;Moist Heat;Traction;Ultrasound;DME Instruction;Functional mobility training;Therapeutic activities;Therapeutic exercise;Balance training;Neuromuscular re-education;Patient/family education;Passive range of motion;Manual techniques    PT Next Visit Plan BERG testing, gaive basic HEP baseline knee pain.    PT Home Exercise Plan nothing given 2nd to time    Consulted and Agree with Plan of Care Patient           Patient will benefit from skilled therapeutic intervention in order to improve the following deficits and impairments:  Abnormal gait, Increased fascial restricitons, Decreased range of motion, Pain, Decreased activity tolerance, Decreased strength, Decreased mobility  Visit Diagnosis: Other abnormalities of gait and mobility - Plan: PT plan of care cert/re-cert  Repeated falls - Plan: PT plan of care cert/re-cert     Problem List Patient Active Problem List   Diagnosis Date Noted  . Closed fracture of patella 06/06/2019  . Pain in right knee 05/12/2019  . Degenerative spondylolisthesis 04/08/2018  . Degeneration of lumbar intervertebral disc 03/18/2018  . Scoliosis of lumbar spine 03/18/2018  . Spinal stenosis of lumbar region 03/18/2018  . Mild dementia ([place]) 01/22/2018  . History of total knee replacement, right 01/21/2018  . Atrial flutter ([place]) 04/02/2017  . Anticoagulated 04/02/2017  . Right carotid bruit 04/02/2017  . Sepsis ([place]) 03/25/2017  .  Pyelonephritis 03/25/2017  . Left ureteral calculus   . Ureteral stone 03/23/2017  . Nephrolithiasis 03/23/2017  . BPH (benign prostatic hypertrophy) 02/03/2013  . Extrinsic asthma, unspecified 01/27/2013  . Essential hypertension, benign 01/27/2013  . Gout, unspecified 01/27/2013  . 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 Right knee DJD 07/22/2012    Carney Living 06/06/2020, 10:01 AM  [place], [place],  [address] Phone: [phone]   Fax:  [phone]  Name: Darrell Perez MRN: [id number] Date of Birth: 06/21/1941

## 2020-06-18 DIAGNOSIS — M19012 Primary osteoarthritis, left shoulder: Secondary | ICD-10-CM | POA: Diagnosis not present

## 2020-07-02 ENCOUNTER — Ambulatory Visit: Payer: PPO | Admitting: Neurology

## 2020-07-03 ENCOUNTER — Other Ambulatory Visit: Payer: Self-pay

## 2020-07-03 ENCOUNTER — Ambulatory Visit: Payer: PPO | Attending: Internal Medicine | Admitting: Physical Therapy

## 2020-07-03 ENCOUNTER — Encounter: Payer: Self-pay | Admitting: Physical Therapy

## 2020-07-03 DIAGNOSIS — R2689 Other abnormalities of gait and mobility: Secondary | ICD-10-CM | POA: Insufficient documentation

## 2020-07-03 DIAGNOSIS — R296 Repeated falls: Secondary | ICD-10-CM | POA: Diagnosis not present

## 2020-07-03 NOTE — Therapy (Signed)
[place], [place], [address] Phone: [phone]   Fax:  [phone]  Physical Therapy Treatment  Patient Details  Name: Darrell Perez MRN: [id number] Date of Birth: Oct 28, 1940 Referring Provider (PT): Dr Lavone Orn    Encounter Date: 07/03/2020   PT End of Session - 07/03/20 0939    Visit Number 2    Number of Visits 12    Date for PT Re-Evaluation 08/29/20    Authorization Type health t[employer]    PT Start Time 0930    PT Stop Time 1013    PT Time Calculation (min) 43 min    Activity Tolerance Patient tolerated treatment well    Behavior During Therapy [facility] for tasks assessed/performed           Past Medical History:  Diagnosis Date  . Arthritis   . GERD (gastroesophageal reflux disease)    watches and NO ISSUES WHEN USING CPAP--  NO MEDS  . Hiatal hernia   . History of asthma   . History of kidney stones   . History of multinodular goiter    s/p  thyroid lobectomy 1967  . Hypertension   . Irritable bowel syndrome   . Left knee DJD   . Left ureteral calculus   . Nephrolithiasis    bilateral non-obstrucive per CT 03-23-2017  . OSA on CPAP    cpap does not know settings   . Paroxysmal atrial fibrillation (HCC) newly dx 03-25-2017 (during hospitlization for urosepsis)   A-fib -- Aflutter w/ RVR -- pt started on cardizem and eliquis--- at discharge in [facility]/  cardiologist-  dr Percival Spanish    Past Surgical History:  Procedure Laterality Date  . CARPAL TUNNEL RELEASE Bilateral [place]   . CATARACT EXTRACTION W/ INTRAOCULAR LENS  IMPLANT, BILATERAL  2005  &  2012  . CYSTOSCOPY WITH STENT PLACEMENT Left 03/23/2017   Procedure: CYSTOSCOPY, LEFT RETROGRADE WITH STENT PLACEMENT;  Surgeon: Lucas Mallow, MD;  Location: [facility];  Service: Urology;  Laterality: Left;  . CYSTOSCOPY/URETEROSCOPY/HOLMIUM LASER/STENT PLACEMENT Left 04/10/2017   Procedure: CYSTOSCOPY/URETEROSCOPY/HOLMIUM  LASER/STENT PLACEMENT;  Surgeon: Festus Aloe, MD;  Location: [facility];  Service: Urology;  Laterality: Left;  . EXTRACORPOREAL SHOCK WAVE LITHOTRIPSY     MULTIPLE  . HEMIARTHROPLASTY RIGHT SHOULDER  2007  . HOLMIUM LASER APPLICATION Left [id number]   Procedure: HOLMIUM LASER APPLICATION;  Surgeon: Fredricka Bonine, MD;  Location: [facility];  Service: Urology;  Laterality: Left;  . [place]   removal of stones. (OPEN PROCUDURE)  . REVERSE SHOULDER ARTHROPLASTY  10/09/2011   Procedure: REVERSE SHOULDER ARTHROPLASTY;  Surgeon: Marin Shutter, MD;  Location: [place];  Service: Orthopedics;  Laterality: Right;  right hardware removal and reverse shoulder arthroplasty  . THYROID LOBECTOMY  1967   goiter removed  . TONSILLECTOMY    . TOTAL KNEE ARTHROPLASTY  07/22/2012   Procedure: TOTAL KNEE ARTHROPLASTY;  Surgeon: Johnn Hai, MD;  Location: [facility];  Service: Orthopedics;  Laterality: Right;  . TOTAL KNEE ARTHROPLASTY Left 12/16/2012   Procedure: LEFT TOTAL KNEE ARTHROPLASTY;  Surgeon: Johnn Hai, MD;  [facility];  Service: Orthopedics;  Laterality: Left;  . TRANSTHORACIC ECHOCARDIOGRAM  03/25/2017   mild LVH, ef 55-60%/  mild LAE/ trivial PR/  mild MR  . TRIGGER FINGER RELEASE Right 2006  . [place] EXTRACTION  2002    There  were no vitals filed for this visit.   Subjective Assessment - 07/03/20 0938    Subjective Patient has no complaints at this time. He has not had any falls. He is having no pain today.    Pertinent History Arthritis: knees and shoulders.    Limitations Standing;Walking    How long can you stand comfortably? can stand for a good amount of time    How long can you walk comfortably? walks with cane. Has falls at times    Diagnostic tests Nothing pertinant    Patient Stated Goals to have less falls/ better balance    Currently in Pain? No/denies              [facility] PT  Assessment - 07/03/20 0001      Standardized Balance Assessment   Standardized Balance Assessment Berg Balance Test      Berg Balance Test   Sit to Stand Able to stand  independently using hands    Standing Unsupported Able to stand safely 2 minutes    Sitting with Back Unsupported but Feet Supported on Floor or Stool Able to sit safely and securely 2 minutes    Stand to Sit Uses backs of legs against chair to control descent    Transfers Able to transfer safely, definite need of hands    Standing Unsupported with Eyes Closed Able to stand 10 seconds with supervision    Standing Unsupported with Feet Together Able to place feet together independently and stand for 1 minute with supervision    From Standing, Reach Forward with Outstretched Arm Can reach forward >5 cm safely (2")    From Standing Position, Pick up Object from Floor Able to pick up shoe, needs supervision    From Standing Position, Turn to Look Behind Over each Shoulder Turn sideways only but maintains balance    Turn 360 Degrees Able to turn 360 degrees safely but slowly    Standing Unsupported, Alternately Place Feet on Step/Stool Able to complete >2 steps/needs minimal assist    Standing Unsupported, One Foot in [employer] balance while stepping or standing    Standing on One Leg Tries to lift leg/unable to hold 3 seconds but remains standing independently    Total Score 33                         [facility] Adult PT Treatment/Exercise - 07/03/20 0001      High Level Balance   High Level Balance Comments narrow base eyes closed 3x30 sec hold. mod a first trial min gaurd the next two.      Knee/Hip Exercises: Seated   Long Arc Quad Limitations 2x15 no band    Clamshell with TheraBand Red   2x15   Hamstring Limitations 2x15 red band                  PT Education - 07/03/20 0939    Education Details HEP and symptom management    Person(s) Educated Patient    Methods  Explanation;Demonstration;Verbal cues;Tactile cues    Comprehension Verbalized understanding;Returned demonstration;Verbal cues required;Tactile cues required            PT Short Term Goals - 06/06/20 0947      PT SHORT TERM GOAL #1   Title Patient will decrease TUG time by 8 seconds    Time 3    Period Weeks    Status New    Target Date 06/27/20  PT SHORT TERM GOAL #2   Title Patient will decreased 5x sit to stand time by 6 seconds    Time 3    Period Weeks    Status New    Target Date 06/27/20      PT SHORT TERM GOAL #3   Title Patient will require supervsion for full tandem stance    Time 3    Period Weeks    Status New    Target Date 06/27/20             PT Long Term Goals - 06/06/20 0948      PT LONG TERM GOAL #1   Title Patient will turn to the left independently without loss of balance    Baseline has difficulty turning to the left    Time 6    Period Weeks    Status New    Target Date 07/18/20      PT LONG TERM GOAL #2   Title Patient will go up down 6 steps independently without lob    Time 6    Period Weeks    Status New    Target Date 07/18/20      PT LONG TERM GOAL #3   Title Patient will be indpednent with balance and gross strengthening program    Time 6    Period Weeks    Status New    Target Date 07/18/20                 Plan - 07/03/20 1039    Clinical Impression Statement Therapy perfreomed BERG on the patient today. His BERG score was 33 putting him at a high fall risk. He was also given abasic LE strengthening program to workon. He had decreased balance with narrow base eyes closed at first but as he practiced he did better. Therapy willc ontinue to advance as tolerated. We will continue to be aware of back pain and right knee pain,    Personal Factors and Comorbidities Age;Comorbidity 1;Comorbidity 3+;Comorbidity 2    Examination-Activity Limitations Squat;Stairs;Locomotion Level    Examination-Participation Restrictions  Community Activity;Laundry;Meal Prep;Occupation    Stability/Clinical Decision Making Evolving/Moderate complexity    Clinical Decision Making Moderate    Rehab Potential Good    PT Frequency 1x / week    PT Duration 6 weeks    PT Treatment/Interventions ADLs/Self Care Home Management;Electrical Stimulation;Cryotherapy;Iontophoresis 4mg /ml Dexamethasone;Moist Heat;Traction;Ultrasound;DME Instruction;Functional mobility training;Therapeutic activities;Therapeutic exercise;Balance training;Neuromuscular re-education;Patient/family education;Passive range of motion;Manual techniques    PT Next Visit Plan BERG testing, gaive basic HEP baseline knee pain.    PT Home Exercise Plan nothing given 2nd to time    Consulted and Agree with Plan of Care Patient           Patient will benefit from skilled therapeutic intervention in order to improve the following deficits and impairments:  Abnormal gait,Increased fascial restricitons,Decreased range of motion,Pain,Decreased activity tolerance,Decreased strength,Decreased mobility  Visit Diagnosis: Other abnormalities of gait and mobility  Repeated falls     Problem List Patient Active Problem List   Diagnosis Date Noted  . Closed fracture of patella 06/06/2019  . Pain in right knee 05/12/2019  . Degenerative spondylolisthesis 04/08/2018  . Degeneration of lumbar intervertebral disc 03/18/2018  . Scoliosis of lumbar spine 03/18/2018  . Spinal stenosis of lumbar region 03/18/2018  . Mild dementia ([place]) 01/22/2018  . History of total knee replacement, right 01/21/2018  . Atrial flutter ([place]) 04/02/2017  . Anticoagulated 04/02/2017  . Right carotid bruit  04/02/2017  . Sepsis (HCC) 03/25/2017  . Pyelonephritis 03/25/2017  . Left ureteral calculus   . Ureteral stone 03/23/2017  . Nephrolithiasis 03/23/2017  . BPH (benign prostatic hypertrophy) 02/03/2013  . Extrinsic asthma, unspecified 01/27/2013  . Essential hypertension, benign  01/27/2013  . Gout, unspecified 01/27/2013  . 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 Right knee DJD 07/22/2012    Dessie Coma PT DPT 07/03/2020, 1:25 PM  [facility] [address] [place], [place], [address] Phone: [phone]   Fax:  [phone]  Name: Darrell Perez MRN: [id number] Date of Birth: [birth year]

## 2020-07-10 ENCOUNTER — Encounter: Payer: Self-pay | Admitting: Physical Therapy

## 2020-07-10 ENCOUNTER — Other Ambulatory Visit: Payer: Self-pay

## 2020-07-10 ENCOUNTER — Ambulatory Visit: Payer: PPO | Admitting: Physical Therapy

## 2020-07-10 DIAGNOSIS — R296 Repeated falls: Secondary | ICD-10-CM

## 2020-07-10 DIAGNOSIS — R2689 Other abnormalities of gait and mobility: Secondary | ICD-10-CM

## 2020-07-11 DIAGNOSIS — M25561 Pain in right knee: Secondary | ICD-10-CM | POA: Diagnosis not present

## 2020-07-11 NOTE — Therapy (Signed)
[place], [place], [address] Phone: [phone]   Fax:  [phone]  Physical Therapy Treatment  Patient Details  Name: Darrell Perez MRN: [id number] Date of Birth: Dec 10, 1940 Referring Provider (PT): Dr Lavone Orn     Encounter Date: 07/10/2020   PT End of Session - 07/10/20 1006    Visit Number 3    Number of Visits 12    Date for PT Re-Evaluation 08/29/20    Authorization Type health t[employer]    PT Start Time 0930    PT Stop Time 1012    PT Time Calculation (min) 42 min    Activity Tolerance Patient tolerated treatment well    Behavior During Therapy [facility] for tasks assessed/performed           Past Medical History:  Diagnosis Date  . Arthritis   . GERD (gastroesophageal reflux disease)    watches and NO ISSUES WHEN USING CPAP--  NO MEDS  . Hiatal hernia   . History of asthma   . History of kidney stones   . History of multinodular goiter    s/p  thyroid lobectomy 1967  . Hypertension   . Irritable bowel syndrome   . Left knee DJD   . Left ureteral calculus   . Nephrolithiasis    bilateral non-obstrucive per CT 03-23-2017  . OSA on CPAP    cpap does not know settings   . Paroxysmal atrial fibrillation (HCC) newly dx 03-25-2017 (during hospitlization for urosepsis)   A-fib -- Aflutter w/ RVR -- pt started on cardizem and eliquis--- at discharge in [facility]/  cardiologist-  dr Percival Spanish    Past Surgical History:  Procedure Laterality Date  . CARPAL TUNNEL RELEASE Bilateral [place]   . CATARACT EXTRACTION W/ INTRAOCULAR LENS  IMPLANT, BILATERAL  2005  &  2012  . CYSTOSCOPY WITH STENT PLACEMENT Left 03/23/2017   Procedure: CYSTOSCOPY, LEFT RETROGRADE WITH STENT PLACEMENT;  Surgeon: Lucas Mallow, MD;  Location: [facility];  Service: Urology;  Laterality: Left;  . CYSTOSCOPY/URETEROSCOPY/HOLMIUM LASER/STENT PLACEMENT Left 04/10/2017   Procedure: CYSTOSCOPY/URETEROSCOPY/HOLMIUM  LASER/STENT PLACEMENT;  Surgeon: Festus Aloe, MD;  Location: [facility];  Service: Urology;  Laterality: Left;  . EXTRACORPOREAL SHOCK WAVE LITHOTRIPSY     MULTIPLE  . HEMIARTHROPLASTY RIGHT SHOULDER  2007  . HOLMIUM LASER APPLICATION Left 08/01/5425   Procedure: HOLMIUM LASER APPLICATION;  Surgeon: Fredricka Bonine, MD;  Location: [facility];  Service: Urology;  Laterality: Left;  . [place]   removal of stones. (OPEN PROCUDURE)  . REVERSE SHOULDER ARTHROPLASTY  10/09/2011   Procedure: REVERSE SHOULDER ARTHROPLASTY;  Surgeon: Marin Shutter, MD;  Location: [place];  Service: Orthopedics;  Laterality: Right;  right hardware removal and reverse shoulder arthroplasty  . THYROID LOBECTOMY  1967   goiter removed  . TONSILLECTOMY    . TOTAL KNEE ARTHROPLASTY  07/22/2012   Procedure: TOTAL KNEE ARTHROPLASTY;  Surgeon: Johnn Hai, MD;  Location: [facility];  Service: Orthopedics;  Laterality: Right;  . TOTAL KNEE ARTHROPLASTY Left 12/16/2012   Procedure: LEFT TOTAL KNEE ARTHROPLASTY;  Surgeon: Johnn Hai, MD;  Location: [facility];  Service: Orthopedics;  Laterality: Left;  . TRANSTHORACIC ECHOCARDIOGRAM  03/25/2017   mild LVH, ef 55-60%/  mild LAE/ trivial PR/  mild MR  . TRIGGER FINGER RELEASE Right 2006  . [place] EXTRACTION  2002  There were no vitals filed for this visit.   Subjective Assessment - 07/10/20 0938    Subjective Patient reports his knee has hurt today when he woke up. He does not remember having any significant pain after the last visit.    Pertinent History Arthritis: knees and shoulders.    Limitations Standing;Walking    How long can you stand comfortably? can stand for a good amount of time    How long can you walk comfortably? walks with cane. Has falls at times    Diagnostic tests Nothing pertinant    Patient Stated Goals to have less falls/ better balance    Currently in Pain? Yes     Pain Score 5     Pain Location Knee    Pain Orientation Right    Pain Descriptors / Indicators Aching    Pain Type Chronic pain    Pain Onset More than a month ago    Pain Frequency Intermittent    Aggravating Factors  woke up with it    Pain Relieving Factors comes and goes    Effect of Pain on Daily Activities pain walking                             [facility] Adult PT Treatment/Exercise - 07/11/20 0001      High Level Balance   High Level Balance Comments narrow base eyes closed 3x30 sec hold CGA. Patient advanced to air-ex mat. Eyes open still required CGSA. Eyes closed min to mod a; tended to loose his balance to the right. Tnadem stance on air-ex mod a right back min a left back 3x20seceach foot; step over hurdle 2x15.      Knee/Hip Exercises: Aerobic   Nustep 5 mion L5      Knee/Hip Exercises: Standing   Heel Raises Limitations x20    Hip Flexion Limitations slow march x20      Knee/Hip Exercises: Supine   Quad Sets Limitations 2x10 each leg 5 sec hold given for HEP    Straight Leg Raises Limitations 2x10 each leg given for HEP. No pain noted in his right knee    Other Supine Knee/Hip Exercises clamshell Blue 2x20                  PT Education - 07/10/20 1006    Education Details reviewed balance exercises and knee exercises to perfrom when he is hurting    Person(s) Educated Patient    Methods Explanation;Verbal cues;Demonstration;Tactile cues    Comprehension Verbalized understanding;Verbal cues required;Tactile cues required;Returned demonstration            PT Short Term Goals - 06/06/20 0947      PT SHORT TERM GOAL #1   Title Patient will decrease TUG time by 8 seconds    Time 3    Period Weeks    Status New    Target Date 06/27/20      PT SHORT TERM GOAL #2   Title Patient will decreased 5x sit to stand time by 6 seconds    Time 3    Period Weeks    Status New    Target Date 06/27/20      PT SHORT TERM GOAL #3   Title  Patient will require supervsion for full tandem stance    Time 3    Period Weeks    Status New    Target Date 06/27/20  PT Long Term Goals - 06/06/20 0948      PT LONG TERM GOAL #1   Title Patient will turn to the left independently without loss of balance    Baseline has difficulty turning to the left    Time 6    Period Weeks    Status New    Target Date 07/18/20      PT LONG TERM GOAL #2   Title Patient will go up down 6 steps independently without lob    Time 6    Period Weeks    Status New    Target Date 07/18/20      PT LONG TERM GOAL #3   Title Patient will be indpednent with balance and gross strengthening program    Time 6    Period Weeks    Status New    Target Date 07/18/20                 Plan - 07/10/20 1006    Clinical Impression Statement Patient tolerated treatment well. he had no increase in knee pain. therapy worked on strengthening exercises geared towards knee stabilization. He was given updated HEP for knee exercises. he did well withbalance exercises. We were able to advance him to exercises on air-ex . he require assist with eyes closed exercises on the air-ex.    Personal Factors and Comorbidities Age;Comorbidity 1;Comorbidity 3+;Comorbidity 2    Comorbidities neuropathy, knee OA, Left shoulder OA    Examination-Activity Limitations Squat;Stairs;Locomotion Level    Examination-Participation Restrictions Community Activity;Laundry;Meal Prep;Occupation    Stability/Clinical Decision Making Evolving/Moderate complexity    Clinical Decision Making Moderate    Rehab Potential Good    PT Frequency 1x / week    PT Duration 6 weeks    PT Treatment/Interventions ADLs/Self Care Home Management;Electrical Stimulation;Cryotherapy;Iontophoresis 4mg /ml Dexamethasone;Moist Heat;Traction;Ultrasound;DME Instruction;Functional mobility training;Therapeutic activities;Therapeutic exercise;Balance training;Neuromuscular re-education;Patient/family  education;Passive range of motion;Manual techniques    PT Next Visit Plan BERG testing, gaive basic HEP baseline knee pain.    PT Home Exercise Plan nothing given 2nd to time    Consulted and Agree with Plan of Care Patient           Patient will benefit from skilled therapeutic intervention in order to improve the following deficits and impairments:  Abnormal gait,Increased fascial restricitons,Decreased range of motion,Pain,Decreased activity tolerance,Decreased strength,Decreased mobility  Visit Diagnosis: Other abnormalities of gait and mobility  Repeated falls     Problem List Patient Active Problem List   Diagnosis Date Noted  . Closed fracture of patella 06/06/2019  . Pain in right knee 05/12/2019  . Degenerative spondylolisthesis 04/08/2018  . Degeneration of lumbar intervertebral disc 03/18/2018  . Scoliosis of lumbar spine 03/18/2018  . Spinal stenosis of lumbar region 03/18/2018  . Mild dementia ([place]) 01/22/2018  . History of total knee replacement, right 01/21/2018  . Atrial flutter ([place]) 04/02/2017  . Anticoagulated 04/02/2017  . Right carotid bruit 04/02/2017  . Sepsis ([place]) 03/25/2017  . Pyelonephritis 03/25/2017  . Left ureteral calculus   . Ureteral stone 03/23/2017  . Nephrolithiasis 03/23/2017  . BPH (benign prostatic hypertrophy) 02/03/2013  . Extrinsic asthma, unspecified 01/27/2013  . Essential hypertension, benign 01/27/2013  . Gout, unspecified 01/27/2013  . 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 Right knee DJD 07/22/2012    Carney Living PT DPT  07/11/2020, 9:13 AM  [facility] [address] [place], [place], [address] Phone: [phone]   Fax:  [phone]  Name: Zaydn  RODGER MALICK MRN: [id number] Date of Birth: 04-25-1941

## 2020-07-16 ENCOUNTER — Ambulatory Visit: Payer: PPO | Admitting: Physical Therapy

## 2020-07-24 ENCOUNTER — Other Ambulatory Visit: Payer: Self-pay

## 2020-07-24 ENCOUNTER — Ambulatory Visit: Payer: PPO | Admitting: Physical Therapy

## 2020-07-24 ENCOUNTER — Encounter: Payer: Self-pay | Admitting: Physical Therapy

## 2020-07-24 DIAGNOSIS — R2689 Other abnormalities of gait and mobility: Secondary | ICD-10-CM | POA: Diagnosis not present

## 2020-07-24 DIAGNOSIS — R296 Repeated falls: Secondary | ICD-10-CM

## 2020-07-24 NOTE — Therapy (Signed)
[place], [place], [address] Phone: [phone]   Fax:  [phone]  Physical Therapy Treatment  Patient Details  Name: Darrell Perez MRN: [id number] Date of Birth: January 18, 1941 Referring Provider (PT): Dr Lavone Orn    Encounter Date: 07/24/2020   PT End of Session - 07/24/20 1048    Visit Number 4    Number of Visits 12    Date for PT Re-Evaluation 08/29/20    Authorization Type health tream [employer]    PT Start Time 0933    PT Stop Time 1014    PT Time Calculation (min) 41 min    Activity Tolerance Patient tolerated treatment well    Behavior During Therapy [facility] for tasks assessed/performed           Past Medical History:  Diagnosis Date  . Arthritis   . GERD (gastroesophageal reflux disease)    watches and NO ISSUES WHEN USING CPAP--  NO MEDS  . Hiatal hernia   . History of asthma   . History of kidney stones   . History of multinodular goiter    s/p  thyroid lobectomy 1967  . Hypertension   . Irritable bowel syndrome   . Left knee DJD   . Left ureteral calculus   . Nephrolithiasis    bilateral non-obstrucive per CT 03-23-2017  . OSA on CPAP    cpap does not know settings   . Paroxysmal atrial fibrillation (HCC) newly dx 03-25-2017 (during hospitlization for urosepsis)   A-fib -- Aflutter w/ RVR -- pt started on cardizem and eliquis--- at discharge in [facility]/  cardiologist-  dr Percival Spanish    Past Surgical History:  Procedure Laterality Date  . CARPAL TUNNEL RELEASE Bilateral [place]   . CATARACT EXTRACTION W/ INTRAOCULAR LENS  IMPLANT, BILATERAL  2005  &  2012  . CYSTOSCOPY WITH STENT PLACEMENT Left 03/23/2017   Procedure: CYSTOSCOPY, LEFT RETROGRADE WITH STENT PLACEMENT;  Surgeon: Lucas Mallow, MD;  [facility];  Service: Urology;  Laterality: Left;  . CYSTOSCOPY/URETEROSCOPY/HOLMIUM LASER/STENT PLACEMENT Left 04/10/2017   Procedure: CYSTOSCOPY/URETEROSCOPY/HOLMIUM  LASER/STENT PLACEMENT;  Surgeon: Festus Aloe, MD;  Location: [facility];  Service: Urology;  Laterality: Left;  . EXTRACORPOREAL SHOCK WAVE LITHOTRIPSY     MULTIPLE  . HEMIARTHROPLASTY RIGHT SHOULDER  2007  . HOLMIUM LASER APPLICATION Left [id number]   Procedure: HOLMIUM LASER APPLICATION;  Surgeon: Fredricka Bonine, MD;  Location: [facility];  Service: Urology;  Laterality: Left;  . [place]   removal of stones. (OPEN PROCUDURE)  . REVERSE SHOULDER ARTHROPLASTY  10/09/2011   Procedure: REVERSE SHOULDER ARTHROPLASTY;  Surgeon: Marin Shutter, MD;  Location: [place];  Service: Orthopedics;  Laterality: Right;  right hardware removal and reverse shoulder arthroplasty  . THYROID LOBECTOMY  1967   goiter removed  . TONSILLECTOMY    . TOTAL KNEE ARTHROPLASTY  07/22/2012   Procedure: TOTAL KNEE ARTHROPLASTY;  Surgeon: Johnn Hai, MD;  [facility];  Service: Orthopedics;  Laterality: Right;  . TOTAL KNEE ARTHROPLASTY Left 12/16/2012   Procedure: LEFT TOTAL KNEE ARTHROPLASTY;  Surgeon: Johnn Hai, MD;  [facility];  Service: Orthopedics;  Laterality: Left;  . TRANSTHORACIC ECHOCARDIOGRAM  03/25/2017   mild LVH, ef 55-60%/  mild LAE/ trivial PR/  mild MR  . TRIGGER FINGER RELEASE Right 2006  . [place] EXTRACTION  2002    There  were no vitals filed for this visit.   Subjective Assessment - 07/24/20 0938    Subjective Patient has had a minor ache in his knee but overall he is doing well. He has had no falls.    Pertinent History Arthritis: knees and shoulders.    Limitations Standing;Walking    How long can you stand comfortably? can stand for a good amount of time    How long can you walk comfortably? walks with cane. Has falls at times    Diagnostic tests Nothing pertinant    Patient Stated Goals to have less falls/ better balance    Currently in Pain? No/denies   minor ache in his knee at times                             [facility] Adult PT Treatment/Exercise - 07/24/20 0001      High Level Balance   High Level Balance Comments narrow base eyes closed 3x30 sec hold CGA. Patient advanced to air-ex mat. Eyes open still required CG. Eyes closed min to mod a; tended to loose his balance to the right. Tan[place] stance on air-ex mod a right back min a left back 3x20sec each foot; step over hurdle 2x15.      Knee/Hip Exercises: Aerobic   Nustep 5 min L5      Knee/Hip Exercises: Standing   Heel Raises Limitations x20    Hip Flexion Limitations slow march x20 2lb    Other Standing Knee Exercises reviewed how marching can be both a balance activity and an exercises.      Knee/Hip Exercises: Supine   [place] Limitations 3x10    Straight Leg Raises Limitations 2x10 each leg  2lb    Other Supine Knee/Hip Exercises clamshell Blue 2x20                  PT Education - 07/24/20 1044    Education Details updated HEP and symptom management    Person(s) Educated Patient    Methods Explanation;Demonstration;Verbal cues;Tactile cues    Comprehension Verbalized understanding;Returned demonstration;Verbal cues required;Tactile cues required            PT Short Term Goals - 06/06/20 0947      PT SHORT TERM GOAL #1   Title Patient will decrease TUG time by 8 seconds    Time 3    Period Weeks    Status New    Target Date 06/27/20      PT SHORT TERM GOAL #2   Title Patient will decreased 5x sit to stand time by 6 seconds    Time 3    Period Weeks    Status New    Target Date 06/27/20      PT SHORT TERM GOAL #3   Title Patient will require supervsion for full tandem stance    Time 3    Period Weeks    Status New    Target Date 06/27/20             PT Long Term Goals - 06/06/20 0948      PT LONG TERM GOAL #1   Title Patient will turn to the left independently without loss of balance    Baseline has difficulty turning to the left    Time 6    Period  Weeks    Status New    Target Date 07/18/20      PT LONG TERM GOAL #2  Title Patient will go up down 6 steps independently without lob    Time 6    Period Weeks    Status New    Target Date 07/18/20      PT LONG TERM GOAL #3   Title Patient will be indpednent with balance and gross strengthening program    Time 6    Period Weeks    Status New    Target Date 07/18/20                 Plan - 07/24/20 1541    Clinical Impression Statement Therapy was able to advance weights on all exercises. He had no increased pain in his right knee. He was given standing exercises for home. Therapy advised patient how to do exercises at home.    Personal Factors and Comorbidities Age;Comorbidity 1;Comorbidity 3+;Comorbidity 2    Comorbidities neuropathy, knee OA, Left shoulder OA    Examination-Activity Limitations Squat;Stairs;Locomotion Level    Examination-Participation Restrictions Community Activity;Laundry;Meal Prep;Occupation    Stability/Clinical Decision Making Evolving/Moderate complexity    Clinical Decision Making Moderate    Rehab Potential Good    PT Frequency 1x / week    PT Duration 6 weeks    PT Treatment/Interventions ADLs/Self Care Home Management;Electrical Stimulation;Cryotherapy;Iontophoresis 4mg /ml Dexamethasone;Moist Heat;Traction;Ultrasound;DME Instruction;Functional mobility training;Therapeutic activities;Therapeutic exercise;Balance training;Neuromuscular re-education;Patient/family education;Passive range of motion;Manual techniques    PT Next Visit Plan BERG testing, gaive basic HEP baseline knee pain.    PT Home Exercise Plan nothing given 2nd to time    Consulted and Agree with Plan of Care Patient           Patient will benefit from skilled therapeutic intervention in order to improve the following deficits and impairments:  Abnormal gait,Increased fascial restricitons,Decreased range of motion,Pain,Decreased activity tolerance,Decreased  strength,Decreased mobility  Visit Diagnosis: Other abnormalities of gait and mobility  Repeated falls     Problem List Patient Active Problem List   Diagnosis Date Noted  . Closed fracture of patella 06/06/2019  . Pain in right knee 05/12/2019  . Degenerative spondylolisthesis 04/08/2018  . Degeneration of lumbar intervertebral disc 03/18/2018  . Scoliosis of lumbar spine 03/18/2018  . Spinal stenosis of lumbar region 03/18/2018  . Mild dementia ([place]) 01/22/2018  . History of total knee replacement, right 01/21/2018  . Atrial flutter ([place]) 04/02/2017  . Anticoagulated 04/02/2017  . Right carotid bruit 04/02/2017  . Sepsis ([place]) 03/25/2017  . Pyelonephritis 03/25/2017  . Left ureteral calculus   . Ureteral stone 03/23/2017  . Nephrolithiasis 03/23/2017  . BPH (benign prostatic hypertrophy) 02/03/2013  . Extrinsic asthma, unspecified 01/27/2013  . Essential hypertension, benign 01/27/2013  . Gout, unspecified 01/27/2013  . 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 Right knee DJD 07/22/2012    Carney Living PT DPT  07/24/2020, 3:45 PM  [facility] [address] [place], [place], [address] Phone: [phone]   Fax:  [phone]  Name: Darrell Perez MRN: [id number] Date of Birth: 1941/06/17

## 2020-07-26 DIAGNOSIS — I480 Paroxysmal atrial fibrillation: Secondary | ICD-10-CM | POA: Diagnosis not present

## 2020-07-26 DIAGNOSIS — N400 Enlarged prostate without lower urinary tract symptoms: Secondary | ICD-10-CM | POA: Diagnosis not present

## 2020-07-26 DIAGNOSIS — I10 Essential (primary) hypertension: Secondary | ICD-10-CM | POA: Diagnosis not present

## 2020-07-26 DIAGNOSIS — M170 Bilateral primary osteoarthritis of knee: Secondary | ICD-10-CM | POA: Diagnosis not present

## 2020-07-26 DIAGNOSIS — K219 Gastro-esophageal reflux disease without esophagitis: Secondary | ICD-10-CM | POA: Diagnosis not present

## 2020-07-26 DIAGNOSIS — F0390 Unspecified dementia without behavioral disturbance: Secondary | ICD-10-CM | POA: Diagnosis not present

## 2020-07-31 ENCOUNTER — Ambulatory Visit: Payer: PPO | Admitting: Physical Therapy

## 2020-08-07 ENCOUNTER — Other Ambulatory Visit: Payer: Self-pay

## 2020-08-07 ENCOUNTER — Ambulatory Visit: Payer: PPO | Attending: Internal Medicine | Admitting: Physical Therapy

## 2020-08-07 DIAGNOSIS — R2689 Other abnormalities of gait and mobility: Secondary | ICD-10-CM | POA: Insufficient documentation

## 2020-08-07 DIAGNOSIS — R296 Repeated falls: Secondary | ICD-10-CM | POA: Diagnosis not present

## 2020-08-07 NOTE — Therapy (Signed)
[place], [place], [address] Phone: [phone]   Fax:  [phone]  Physical Therapy Treatment  Patient Details  Name: Darrell Perez MRN: [id number] Date of Birth: 1940/11/13 Referring Provider (PT): Dr Lavone Orn    Encounter Date: 08/07/2020    PT End of Session - 08/07/20 1016    Visit Number 5    Number of Visits 12    Date for PT Re-Evaluation 08/29/20    Authorization Type health [employer]    PT Start Time 0845    PT Stop Time 0926    PT Time Calculation (min) 41 min    Activity Tolerance Patient tolerated treatment well    Behavior During Therapy [facility] for tasks assessed/performed           Past Medical History:  Diagnosis Date  . Arthritis   . GERD (gastroesophageal reflux disease)    watches and NO ISSUES WHEN USING CPAP--  NO MEDS  . Hiatal hernia   . History of asthma   . History of kidney stones   . History of multinodular goiter    s/p  thyroid lobectomy 1967  . Hypertension   . Irritable bowel syndrome   . Left knee DJD   . Left ureteral calculus   . Nephrolithiasis    bilateral non-obstrucive per CT 03-23-2017  . OSA on CPAP    cpap does not know settings   . Paroxysmal atrial fibrillation (HCC) newly dx 03-25-2017 (during hospitlization for urosepsis)   A-fib -- Aflutter w/ RVR -- pt started on cardizem and eliquis--- at discharge in [facility]/  cardiologist-  dr Percival Spanish    Past Surgical History:  Procedure Laterality Date  . CARPAL TUNNEL RELEASE Bilateral [place]   . CATARACT EXTRACTION W/ INTRAOCULAR LENS  IMPLANT, BILATERAL  2005  &  2012  . CYSTOSCOPY WITH STENT PLACEMENT Left 03/23/2017   Procedure: CYSTOSCOPY, LEFT RETROGRADE WITH STENT PLACEMENT;  Surgeon: Lucas Mallow, MD;  Location: [facility];  Service: Urology;  Laterality: Left;  . CYSTOSCOPY/URETEROSCOPY/HOLMIUM LASER/STENT PLACEMENT Left 04/10/2017   Procedure: CYSTOSCOPY/URETEROSCOPY/HOLMIUM  LASER/STENT PLACEMENT;  Surgeon: Festus Aloe, MD;  Location: [facility];  Service: Urology;  Laterality: Left;  . EXTRACORPOREAL SHOCK WAVE LITHOTRIPSY     MULTIPLE  . HEMIARTHROPLASTY RIGHT SHOULDER  2007  . HOLMIUM LASER APPLICATION Left 5/59/7416   Procedure: HOLMIUM LASER APPLICATION;  Surgeon: Fredricka Bonine, MD;  Location: [facility];  Service: Urology;  Laterality: Left;  . [place]   removal of stones. (OPEN PROCUDURE)  . REVERSE SHOULDER ARTHROPLASTY  10/09/2011   Procedure: REVERSE SHOULDER ARTHROPLASTY;  Surgeon: Marin Shutter, MD;  Location: [facility];  Service: Orthopedics;  Laterality: Right;  right hardware removal and reverse shoulder arthroplasty  . THYROID LOBECTOMY  1967   goiter removed  . TONSILLECTOMY    . TOTAL KNEE ARTHROPLASTY  07/22/2012   Procedure: TOTAL KNEE ARTHROPLASTY;  Surgeon: Johnn Hai, MD;  Location: [facility];  Service: Orthopedics;  Laterality: Right;  . TOTAL KNEE ARTHROPLASTY Left 12/16/2012   Procedure: LEFT TOTAL KNEE ARTHROPLASTY;  Surgeon: Johnn Hai, MD;  Location: [facility];  Service: Orthopedics;  Laterality: Left;  . TRANSTHORACIC ECHOCARDIOGRAM  03/25/2017   mild LVH, ef 55-60%/  mild LAE/ trivial PR/  mild MR  . TRIGGER FINGER RELEASE Right 2006  . [place] EXTRACTION  2002  There were no vitals filed for this visit.   Subjective Assessment - 08/07/20 0948    Subjective Patient missed last week because of blood proessure issues. He reports they have resolved. He feels about the same. his knee hurts from time to time but today it is fair. He tripped over the weekedn and hit his elbow    Pertinent History Arthritis: knees and shoulders.    Limitations Standing;Walking    How long can you stand comfortably? can stand for a good amount of time    How long can you walk comfortably? walks with cane. Has falls at times    Diagnostic tests Nothing pertinant     Patient Stated Goals to have less falls/ better balance    Currently in Pain? Yes    Pain Location Knee    Pain Orientation Right    Pain Descriptors / Indicators Aching    Pain Type Chronic pain    Pain Onset More than a month ago    Pain Frequency Intermittent    Aggravating Factors  woke up withit    Pain Relieving Factors comes and goes    Effect of Pain on Daily Activities pain walking                             [facility] Adult PT Treatment/Exercise - 08/07/20 0001      High Level Balance   High Level Balance Comments narrow base eyes closed  and open 3x30 sec hold CGA.air-ex mat. ;  [place] stance on air-ex mod a right back min a left back 3x20sec each foot; side step over hurdle 2x15 forward and back step over hurddle x15 .Air-ex cross reach x1- each arm; air-ex cabinet reach x20      Knee/Hip Exercises: Supine   Bridges Limitations 3x10    Straight Leg Raises Limitations 2x10 each leg  2lb    Other Supine Knee/Hip Exercises clamshell Blue 2x20    Other Supine Knee/Hip Exercises supine march x20                  PT Education - 08/07/20 0951    Education Details reviewed benefits of balance exercises    Person(s) Educated Patient    Methods Demonstration;Tactile cues;Verbal cues;Explanation    Comprehension Returned demonstration;Verbal cues required;Tactile cues required;Verbalized understanding            PT Short Term Goals - 06/06/20 0947      PT SHORT TERM GOAL #1   Title Patient will decrease TUG time by 8 seconds    Time 3    Period Weeks    Status New    Target Date 06/27/20      PT SHORT TERM GOAL #2   Title Patient will decreased 5x sit to stand time by 6 seconds    Time 3    Period Weeks    Status New    Target Date 06/27/20      PT SHORT TERM GOAL #3   Title Patient will require supervsion for full tandem stance    Time 3    Period Weeks    Status New    Target Date 06/27/20             PT Long Term Goals -  06/06/20 0948      PT LONG TERM GOAL #1   Title Patient will turn to the left independently without loss of balance  Baseline has difficulty turning to the left    Time 6    Period Weeks    Status New    Target Date 07/18/20      PT LONG TERM GOAL #2   Title Patient will go up down 6 steps independently without lob    Time 6    Period Weeks    Status New    Target Date 07/18/20      PT LONG TERM GOAL #3   Title Patient will be indpednent with balance and gross strengthening program    Time 6    Period Weeks    Status New    Target Date 07/18/20                 Plan - 08/07/20 1017    Clinical Impression Statement Despite fall the patients balance appears to be improving. He was able to step on the air-ex with no loss of balance. He was aslo able to perfrom reaching acrossed exercises on the air-ex and reaching up exercises without loss of balance. He required gaurding for eyes closed activity but had no loss of balance. He also worked on hurdles today. We will continue tro advance ther-ex as tolerated.    Personal Factors and Comorbidities Age;Comorbidity 1;Comorbidity 3+;Comorbidity 2    Comorbidities neuropathy, knee OA, Left shoulder OA    Examination-Activity Limitations Squat;Stairs;Locomotion Level    Examination-Participation Restrictions Community Activity;Laundry;Meal Prep;Occupation    Stability/Clinical Decision Making Evolving/Moderate complexity    Clinical Decision Making Moderate    Rehab Potential Good    PT Frequency 1x / week    PT Duration 6 weeks    PT Treatment/Interventions ADLs/Self Care Home Management;Electrical Stimulation;Cryotherapy;Iontophoresis 4mg /ml Dexamethasone;Moist Heat;Traction;Ultrasound;DME Instruction;Functional mobility training;Therapeutic activities;Therapeutic exercise;Balance training;Neuromuscular re-education;Patient/family education;Passive range of motion;Manual techniques    PT Next Visit Plan BERG testing, gaive basic  Hcontinue to advance balance activity while being aware of the knee. consdier pertabations next visit. review homre balance exercises    PT Home Exercise Plan nothing given 2nd to time           Patient will benefit from skilled therapeutic intervention in order to improve the following deficits and impairments:  Abnormal gait,Increased fascial restricitons,Decreased range of motion,Pain,Decreased activity tolerance,Decreased strength,Decreased mobility  Visit Diagnosis: Other abnormalities of gait and mobility  Repeated falls     Problem List Patient Active Problem List   Diagnosis Date Noted  . Closed fracture of patella 06/06/2019  . Pain in right knee 05/12/2019  . Degenerative spondylolisthesis 04/08/2018  . Degeneration of lumbar intervertebral disc 03/18/2018  . Scoliosis of lumbar spine 03/18/2018  . Spinal stenosis of lumbar region 03/18/2018  . Mild dementia ([place]) 01/22/2018  . History of total knee replacement, right 01/21/2018  . Atrial flutter ([place]) 04/02/2017  . Anticoagulated 04/02/2017  . Right carotid bruit 04/02/2017  . Sepsis ([place]) 03/25/2017  . Pyelonephritis 03/25/2017  . Left ureteral calculus   . Ureteral stone 03/23/2017  . Nephrolithiasis 03/23/2017  . BPH (benign prostatic hypertrophy) 02/03/2013  . Extrinsic asthma, unspecified 01/27/2013  . Essential hypertension, benign 01/27/2013  . Gout, unspecified 01/27/2013  . 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 Right knee DJD 07/22/2012    Carney Living PT DPT  08/07/2020, 2:41 PM  [facility] [address] [place], [place], [address] Phone: [phone]   Fax:  [phone]  Name: Darrell Perez MRN: [id number] Date of Birth: 1940-09-01

## 2020-08-10 ENCOUNTER — Other Ambulatory Visit: Payer: Self-pay

## 2020-08-10 ENCOUNTER — Ambulatory Visit (INDEPENDENT_AMBULATORY_CARE_PROVIDER_SITE_OTHER): Payer: PPO | Admitting: Neurology

## 2020-08-10 ENCOUNTER — Encounter: Payer: Self-pay | Admitting: Neurology

## 2020-08-10 VITALS — BP 135/64 | HR 49 | Ht 69.0 in | Wt 199.4 lb

## 2020-08-10 DIAGNOSIS — F0390 Unspecified dementia without behavioral disturbance: Secondary | ICD-10-CM | POA: Diagnosis not present

## 2020-08-10 DIAGNOSIS — R251 Tremor, unspecified: Secondary | ICD-10-CM | POA: Diagnosis not present

## 2020-08-10 DIAGNOSIS — F03A0 Unspecified dementia, mild, without behavioral disturbance, psychotic disturbance, mood disturbance, and anxiety: Secondary | ICD-10-CM

## 2020-08-10 MED ORDER — GALANTAMINE HYDROBROMIDE 12 MG PO TABS
12.0000 mg | ORAL_TABLET | Freq: Two times a day (BID) | ORAL | 3 refills | Status: DC
Start: 2020-08-10 — End: 2021-09-10

## 2020-08-10 NOTE — Patient Instructions (Signed)
Always good to see you!   1. Refills for Galantamine 12mg  twice a day sent to your mail order pharmacy.   2. Continue to monitor hand shaking, please take a video if able.  3. Follow-up in 6-8 months, call for any changes.   FALL PRECAUTIONS: Be cautious when walking. Scan the area for obstacles that may increase the risk of trips and falls. When getting up in the mornings, sit up at the edge of the bed for a few minutes before getting out of bed. Consider elevating the bed at the head end to avoid drop of blood pressure when getting up. Walk always in a well-lit room (use night lights in the walls). Avoid area rugs or power cords from appliances in the middle of the walkways. Use a walker or a cane if necessary and consider physical therapy for balance exercise. Get your eyesight checked regularly.  HOME SAFETY: Consider the safety of the kitchen when operating appliances like stoves, microwave oven, and blender. Consider having supervision and share cooking responsibilities until no longer able to participate in those. Accidents with firearms and other hazards in the house should be identified and addressed as well.  DRIVING: Regarding driving, in patients with progressive memory problems, driving will be impaired. We advise to have someone else do the driving if trouble finding directions or if minor accidents are reported. Independent driving assessment is available to determine safety of driving.  ABILITY TO BE LEFT ALONE: If patient is unable to contact 911 operator, consider using LifeLine, or when the need is there, arrange for someone to stay with patients. Smoking is a fire hazard, consider supervision or cessation. Risk of wandering should be assessed by caregiver and if detected at any point, supervision and safe proof recommendations should be instituted.  MEDICATION SUPERVISION: Inability to self-administer medication needs to be constantly addressed. Implement a mechanism to ensure  safe administration of the medications.  RECOMMENDATIONS FOR ALL PATIENTS WITH MEMORY PROBLEMS: 1. Continue to exercise (Recommend 30 minutes of walking everyday, or 3 hours every week) 2. Increase social interactions - continue going to [place] and enjoy social gatherings with friends and family 3. Eat healthy, avoid fried foods and eat more fruits and vegetables 4. Maintain adequate blood pressure, blood sugar, and blood cholesterol level. Reducing the risk of stroke and cardiovascular disease also helps promoting better memory. 5. Avoid stressful situations. Live a simple life and avoid aggravations. Organize your time and prepare for the next day in anticipation. 6. Sleep well, avoid any interruptions of sleep and avoid any distractions in the bedroom that may interfere with adequate sleep quality 7. Avoid sugar, avoid sweets as there is a strong link between excessive sugar intake, diabetes, and cognitive impairment The Mediterranean diet has been shown to help patients reduce the risk of progressive memory disorders and reduces cardiovascular risk. This includes eating fish, eat fruits and green leafy vegetables, nuts like almonds and hazelnuts, walnuts, and also use olive oil. Avoid fast foods and fried foods as much as possible. Avoid sweets and sugar as sugar use has been linked to worsening of memory function.  There is always a concern of gradual progression of memory problems. If this is the case, then we may need to adjust level of care according to patient needs. Support, both to the patient and caregiver, should then be put into place.

## 2020-08-10 NOTE — Progress Notes (Signed)
NEUROLOGY FOLLOW UP OFFICE NOTE  Darrell Perez [id number] [birth year]  HISTORY OF PRESENT ILLNESS: I had the pleasure of seeing Darrell Perez in follow-up in the neurology clinic on 08/10/2020.  The patient was last seen 9 months ago for dementia, likely due to Alzheimer's disease. He is again accompanied by his wife who helps supplement the history today.  Records and images were personally reviewed where available.  He is on Galantamine 12mg  BID without side effects. He had side effects on Donepezil and Memantine. Since his last visit, he feels his memory is alright, he can't remember things. His wife notes it has declined some, he repeats himself more. They have a lot of appointment dates to keep up which he has difficulty doing even when they are on a calendar, he forgets that is on for that day. He manages his own medications, his wife checks behind him and notes he is good with taking them regularly. He drives very minimally, only to church 2 miles away, with no issues. His wife manages finances. He is independent with dressing and bathing. Sleep is fairly good, he naps during the day. Mood is pretty calm, his wife denies any paranoia or hallucinations. He has recurrent falls, he fell yesterday. He continues to do regular physical therapy. He has some incontinence if he does not get to the bathroom on time. His wife notes new symptoms of intermittent right hand shaking that started 3 months ago. Initially they were happening every 10 days, but recently occurring every few days lasting a few minutes. He says he can stop it by balling his hand in a fist. It does not affect writing or using utensils/drinking from cup. No associated weakness, pain, confusion. No neck pain, focal numbness/tingling/weakness. His right knee bothers him.     History on Initial Assessment: This is a pleasant 80 year old right-handed man with a history of hypertension, goiter s/p thyroid lobectomy, atrial fibrillation,  dementia, presenting to establish care. He feels his memory is pretty good. His wife started noticing memory changes around 5 years ago where he was getting more forgetful, but worse in the past 1-2 years. He had sepsis in the Fall and things seemed to accelerate then. He had more difficulties initially when he got home, but has acclimated since then. His wife has taken over most of the driving after he made her nervous trying to turn left onto oncoming traffic 1.5 years ago and did not seem to comprehend this was difficult. He continues to manage his own medications. There have been a few bills that he has let go too long without paying, which is unusual. He has word-finding difficulties. His wife denies any personality changes, no paranoia or hallucinations. He had abnormal dreams on Aricept and is taking Galantamine, however this is quite costly for their budget and his wife wonders if there is any true benefit from it. No side effects on galantamine.  He has low back pain and both legs feel weak. Otherwise he denies any headaches, dizziness, vision changes, dysarthria/dysphagia, neck pain, focal numbness/tingling, bowel/bladder dysfunction, anosmia, or tremors. Sleep is good. No falls. Both parents had dementia. He denies any history of significant head injuries or alcohol use.   I personally reviewed MRI brain with and without contrast done 10/2015 which did not show any acute changes. There was mild dilatation of the lateral ventricles with sparing of the temporal horns, mild diffuse atrophy and chronic microvascular disease.  Laboratory Data: TSH 03/2017 normal 1.13  PAST MEDICAL HISTORY: Past Medical History:  Diagnosis Date  . Arthritis   . GERD (gastroesophageal reflux disease)    watches and NO ISSUES WHEN USING CPAP--  NO MEDS  . Hiatal hernia   . History of asthma   . History of kidney stones   . History of multinodular goiter    s/p  thyroid lobectomy 1967  . Hypertension   .  Irritable bowel syndrome   . Left knee DJD   . Left ureteral calculus   . Nephrolithiasis    bilateral non-obstrucive per CT 03-23-2017  . OSA on CPAP    cpap does not know settings   . Paroxysmal atrial fibrillation (HCC) newly dx 03-25-2017 (during hospitlization for urosepsis)   A-fib -- Aflutter w/ RVR -- pt started on cardizem and eliquis--- at discharge in [facility]/  cardiologist-  dr hochrein    MEDICATIONS: Current Outpatient Medications on File Prior to Visit  Medication Sig Dispense Refill  . acetaminophen (TYLENOL) 500 MG tablet Take by mouth every 6 (six) hours as needed for mild pain, moderate pain, fever or headache.     . allopurinol (ZYLOPRIM) 100 MG tablet Take 100 mg by mouth every morning.     . cholecalciferol (VITAMIN D) 1000 UNITS tablet Take 1,000 Units by mouth daily.    [name] diltiazem (CARDIZEM CD) 240 MG 24 hr capsule Take 1 capsule (240 mg total) by mouth every morning. Patient needs appointment (Patient taking differently: Take 240 mg by mouth every morning. Patient needs appointment Pt takes BID) 90 capsule 0  . HYDROcodone-acetaminophen (NORCO/VICODIN) 5-325 MG tablet Take 1 tablet by mouth every 6 (six) hours as needed for moderate pain.    . hyoscyamine (ANASPAZ) 0.125 MG TBDP disintergrating tablet Place 0.125 mg under the tongue every 6 (six) hours as needed for cramping.    . indapamide (LOZOL) 2.5 MG tablet Take 2.5 mg by mouth every evening.     . loperamide (IMODIUM) 2 MG capsule Take 1-2 mg by mouth as needed.    . loratadine (CLARITIN) 10 MG tablet Take 10 mg by mouth every morning.    . pantoprazole (PROTONIX) 40 MG tablet Take 1 tablet by mouth daily.    . potassium chloride SA (K-DUR,KLOR-CON) 20 MEQ tablet Take 20 mEq by mouth 2 (two) times daily.    . finasteride (PROSCAR) 5 MG tablet Take 5 mg by mouth daily.    [name] galantamine (RAZADYNE) 12 MG tablet Take 1 tablet (12 mg total) by mouth 2 (two) times daily. 180 tablet 3  . polyethylene glycol (MIRALAX  / GLYCOLAX) packet Take 17 g by mouth daily. (Patient taking differently: Take 17 g by mouth daily as needed. ) 14 each 0  . tolterodine (DETROL LA) 4 MG 24 hr capsule Take by mouth daily.    . vitamin B-12 (CYANOCOBALAMIN) 1000 MCG tablet Take 1,000 mcg by mouth every morning.      No current facility-administered medications on file prior to visit.    ALLERGIES: Allergies  Allergen Reactions  . Naprosyn [Naproxen] Other (See Comments)    "makes me jittery/nervous"    FAMILY HISTORY: Family History  Problem Relation Age of Onset  . Dementia Mother   . Dementia Father   . Anesthesia problems Neg Hx   . Hypotension Neg Hx   . Malignant hyperthermia Neg Hx   . Pseudochol deficiency Neg Hx     SOCIAL HISTORY: Social History   Socioeconomic History  . Marital status: Married  Spouse name: Not on file  . Number of children: 2  . Years of education: Not on file  . Highest education level: Not on file  Occupational History  . Not on file  Tobacco Use  . Smoking status: Never Smoker  . Smokeless tobacco: Never Used  Vaping Use  . Vaping Use: Never used  Substance and Sexual Activity  . Alcohol use: No  . Drug use: No  . Sexual activity: Not Currently    Partners: Female  Other Topics Concern  . Not on file  Social History Narrative   Pt lives in 1 story home with his wife   Has 2 adult children   4 year degree   Retired Optometrist    Right handed   Social Determinants of Health   Financial Resource Strain: Not on file  Food Insecurity: Not on file  Transportation Needs: Not on file  Physical Activity: Not on file  Stress: Not on file  Social Connections: Not on file  Intimate Partner Violence: Not on file     PHYSICAL EXAM: Vitals:   08/10/20 0829  BP: 135/64  Pulse: (!) 49  SpO2: 96%   General: No acute distress Head:  Normocephalic/atraumatic Skin/Extremities: No rash, no edema Neurological Exam: alert and oriented to person, place, and time.  No aphasia or dysarthria. Fund of knowledge is appropriate.  Recent and remote memory are intact.  Attention and concentration are normal.  MMSE 28/30 MMSE - Mini Mental State Exam 08/10/2020 01/22/2018  Orientation to time 4 5  Orientation to Place 5 5  Registration 3 3  Attention/ Calculation 4 4  Recall 3 2  Language- name 2 objects 2 2  Language- repeat 1 1  Language- follow 3 step command 3 3  Language- read & follow direction 1 1  Write a sentence 1 1  Copy design 1 1  Total score 28 28   Cranial nerves: Pupils equal, round. Extraocular movements intact with no nystagmus. Visual fields full.  No facial asymmetry.  Motor: Bulk and tone normal, no cogwheeling, muscle strength 5/5 throughout with no pronator drift.   Finger to nose testing intact. Reflexes +1 throughout. Gait slow and cautious, fair arm swing, unsteady without cane. No tremors in office today. Good finger taps.    IMPRESSION: This is a pleasant 80 yo RH man with a history of  hypertension, goiter s/p thyroid lobectomy, atrial fibrillation, with mild dementia, likely due to Alzheimer's disease. MMSE today 28/30 (28/30 in 12/2017). There has been slow progression over the years, continue Galantamine 12mg  BID, he had side effects on Donepezil and Memantine in the past. His wife reports new right hand intermittent tremor/shaking, none in office today, no parkinsonian signs. Continue to monitor, wife asked to take a video of shaking. Continue close supervision. Follow-up in 6-8 months, they know to call for any changes.    Thank you for allowing me to participate in his care.  Please do not hesitate to call for any questions or concerns.   Ellouise Newer, M.D.   CC: Dr. Laurann [place]

## 2020-08-14 ENCOUNTER — Ambulatory Visit: Payer: PPO | Admitting: Physical Therapy

## 2020-08-14 ENCOUNTER — Encounter: Payer: Self-pay | Admitting: Physical Therapy

## 2020-08-14 ENCOUNTER — Other Ambulatory Visit: Payer: Self-pay

## 2020-08-14 DIAGNOSIS — R2689 Other abnormalities of gait and mobility: Secondary | ICD-10-CM

## 2020-08-14 DIAGNOSIS — R296 Repeated falls: Secondary | ICD-10-CM

## 2020-08-14 NOTE — Therapy (Signed)
[place], [place], [address] Phone: [phone]   Fax:  [phone]  Physical Therapy Treatment  Patient Details  Name: Darrell Perez MRN: [id number] Date of Birth: June 26, 1941 Referring Provider (PT): Dr Lavone Orn    Encounter Date: 08/14/2020   PT End of Session - 08/14/20 1408    Visit Number 6    Number of Visits 12    Date for PT Re-Evaluation 08/29/20    Authorization Type health tream advantage    PT Start Time 1326    PT Stop Time 1410    PT Time Calculation (min) 44 min    Activity Tolerance Patient tolerated treatment well    Behavior During Therapy [facility] for tasks assessed/performed           Past Medical History:  Diagnosis Date  . Arthritis   . GERD (gastroesophageal reflux disease)    watches and NO ISSUES WHEN USING CPAP--  NO MEDS  . Hiatal hernia   . History of asthma   . History of kidney stones   . History of multinodular goiter    s/p  thyroid lobectomy 1967  . Hypertension   . Irritable bowel syndrome   . Left knee DJD   . Left ureteral calculus   . Nephrolithiasis    bilateral non-obstrucive per CT 03-23-2017  . OSA on CPAP    cpap does not know settings   . Paroxysmal atrial fibrillation (HCC) newly dx 03-25-2017 (during hospitlization for urosepsis)   A-fib -- Aflutter w/ RVR -- pt started on cardizem and eliquis--- at discharge in [facility]/  cardiologist-  dr Percival Spanish    Past Surgical History:  Procedure Laterality Date  . CARPAL TUNNEL RELEASE Bilateral [place]   . CATARACT EXTRACTION W/ INTRAOCULAR LENS  IMPLANT, BILATERAL  2005  &  2012  . CYSTOSCOPY WITH STENT PLACEMENT Left 03/23/2017   Procedure: CYSTOSCOPY, LEFT RETROGRADE WITH STENT PLACEMENT;  Surgeon: Lucas Mallow, MD;  Location: [facility];  Service: Urology;  Laterality: Left;  . CYSTOSCOPY/URETEROSCOPY/HOLMIUM LASER/STENT PLACEMENT Left 04/10/2017   Procedure: CYSTOSCOPY/URETEROSCOPY/HOLMIUM  LASER/STENT PLACEMENT;  Surgeon: Festus Aloe, MD;  Location: [facility];  Service: Urology;  Laterality: Left;  . EXTRACORPOREAL SHOCK WAVE LITHOTRIPSY     MULTIPLE  . HEMIARTHROPLASTY RIGHT SHOULDER  2007  . HOLMIUM LASER APPLICATION Left 11/07/2583   Procedure: HOLMIUM LASER APPLICATION;  Surgeon: Fredricka Bonine, MD;  Location: [facility];  Service: Urology;  Laterality: Left;  . [place]   removal of stones. (OPEN PROCUDURE)  . REVERSE SHOULDER ARTHROPLASTY  10/09/2011   Procedure: REVERSE SHOULDER ARTHROPLASTY;  Surgeon: Marin Shutter, MD;  Location: [place];  Service: Orthopedics;  Laterality: Right;  right hardware removal and reverse shoulder arthroplasty  . THYROID LOBECTOMY  1967   goiter removed  . TONSILLECTOMY    . TOTAL KNEE ARTHROPLASTY  07/22/2012   Procedure: TOTAL KNEE ARTHROPLASTY;  Surgeon: Johnn Hai, MD;  Location: [facility];  Service: Orthopedics;  Laterality: Right;  . TOTAL KNEE ARTHROPLASTY Left 12/16/2012   Procedure: LEFT TOTAL KNEE ARTHROPLASTY;  Surgeon: Johnn Hai, MD;  Location: [facility];  Service: Orthopedics;  Laterality: Left;  . TRANSTHORACIC ECHOCARDIOGRAM  03/25/2017   mild LVH, ef 55-60%/  mild LAE/ trivial PR/  mild MR  . TRIGGER FINGER RELEASE Right 2006  . [place] EXTRACTION  2002    There  were no vitals filed for this visit.   Subjective Assessment - 08/14/20 1325    Subjective "Some days are better than others" "Fair today"    Currently in Pain? Yes    Pain Score 5     Pain Location Knee    Pain Orientation Right                             [facility] Adult PT Treatment/Exercise - 08/14/20 0001      High Level Balance   High Level Balance Activities Backward walking;Side stepping   Decrease step lenght LLE with backwards walking   High Level Balance Comments Eyes closed on airex 10 sec x4, Side step over dowel rod, Side step on and off  airex 2x5      Knee/Hip Exercises: Aerobic   Nustep 5 min L5      Knee/Hip Exercises: Standing   Other Standing Knee Exercises Alt 4 inch boc taps with SPC 2x10      Knee/Hip Exercises: Seated   Long Arc Quad Both;Strengthening;2 sets;10 reps    Long Arc Quad Weight 3 lbs.    Marching Strengthening;Both;1 set;10 reps    Hamstring Curl Both;2 sets;10 reps    Hamstring Limitations red band    Sit to Sand 2 sets;5 reps;without UE support   cues for anterior wt shift                   PT Short Term Goals - 06/06/20 0947      PT SHORT TERM GOAL #1   Title Patient will decrease TUG time by 8 seconds    Time 3    Period Weeks    Status New    Target Date 06/27/20      PT SHORT TERM GOAL #2   Title Patient will decreased 5x sit to stand time by 6 seconds    Time 3    Period Weeks    Status New    Target Date 06/27/20      PT SHORT TERM GOAL #3   Title Patient will require supervsion for full tandem stance    Time 3    Period Weeks    Status New    Target Date 06/27/20             PT Long Term Goals - 06/06/20 0948      PT LONG TERM GOAL #1   Title Patient will turn to the left independently without loss of balance    Baseline has difficulty turning to the left    Time 6    Period Weeks    Status New    Target Date 07/18/20      PT LONG TERM GOAL #2   Title Patient will go up down 6 steps independently without lob    Time 6    Period Weeks    Status New    Target Date 07/18/20      PT LONG TERM GOAL #3   Title Patient will be indpednent with balance and gross strengthening program    Time 6    Period Weeks    Status New    Target Date 07/18/20                 Plan - 08/14/20 1409    Clinical Impression Statement Pt did well with today's interventions. Cues to performed full TKE needed wit LAQ. Some core weakness present  with seated marches evident by posterior trunk leaning. CGA needed with all interventions on airex and when stepping  over dowel rod. Some slight scissoring and decrease step length noted with backwards walking. Tactile cue given to keep hips square with side steps.    Personal Factors and Comorbidities Age;Comorbidity 1;Comorbidity 3+;Comorbidity 2    Comorbidities neuropathy, knee OA, Left shoulder OA    Examination-Activity Limitations Squat;Stairs;Locomotion Level    Examination-Participation Restrictions Community Activity;Laundry;Meal Prep;Occupation    Stability/Clinical Decision Making Evolving/Moderate complexity    Rehab Potential Good    PT Duration 6 weeks    PT Treatment/Interventions ADLs/Self Care Home Management;Electrical Stimulation;Cryotherapy;Iontophoresis 4mg /ml Dexamethasone;Moist Heat;Traction;Ultrasound;DME Instruction;Functional mobility training;Therapeutic activities;Therapeutic exercise;Balance training;Neuromuscular re-education;Patient/family education;Passive range of motion;Manual techniques    PT Next Visit Plan BERG testing, gaive basic Hcontinue to advance balance activity while being aware of the knee. consdier pertabations next visit.           Patient will benefit from skilled therapeutic intervention in order to improve the following deficits and impairments:  Abnormal gait,Increased fascial restricitons,Decreased range of motion,Pain,Decreased activity tolerance,Decreased strength,Decreased mobility  Visit Diagnosis: Repeated falls  Other abnormalities of gait and mobility     Problem List Patient Active Problem List   Diagnosis Date Noted  . Closed fracture of patella 06/06/2019  . Pain in right knee 05/12/2019  . Degenerative spondylolisthesis 04/08/2018  . Degeneration of lumbar intervertebral disc 03/18/2018  . Scoliosis of lumbar spine 03/18/2018  . Spinal stenosis of lumbar region 03/18/2018  . Mild dementia ([place]) 01/22/2018  . History of total knee replacement, right 01/21/2018  . Atrial flutter ([place]) 04/02/2017  . Anticoagulated 04/02/2017  .  Right carotid bruit 04/02/2017  . Sepsis ([place]) 03/25/2017  . Pyelonephritis 03/25/2017  . Left ureteral calculus   . Ureteral stone 03/23/2017  . Nephrolithiasis 03/23/2017  . BPH (benign prostatic hypertrophy) 02/03/2013  . Extrinsic asthma, unspecified 01/27/2013  . Essential hypertension, benign 01/27/2013  . Gout, unspecified 01/27/2013  . 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 Right knee DJD 07/22/2012    Scot Jun, PTA 08/14/2020, 2:14 PM  [facility] [address] [place], [place], [address] Phone: [phone]   Fax:  [phone]  Name: LADD CEN MRN: [id number] Date of Birth: 03-02-1941

## 2020-08-21 ENCOUNTER — Encounter: Payer: Self-pay | Admitting: Physical Therapy

## 2020-08-21 ENCOUNTER — Ambulatory Visit: Payer: PPO | Admitting: Physical Therapy

## 2020-08-21 ENCOUNTER — Other Ambulatory Visit: Payer: Self-pay

## 2020-08-21 DIAGNOSIS — R296 Repeated falls: Secondary | ICD-10-CM

## 2020-08-21 DIAGNOSIS — R2689 Other abnormalities of gait and mobility: Secondary | ICD-10-CM

## 2020-08-21 NOTE — Therapy (Signed)
[place], [place], [address] Phone: [phone]   Fax:  [phone]  Physical Therapy Treatment  Patient Details  Name: Darrell Perez MRN: [id number] Date of Birth: 1940/10/06 Referring Provider (PT): Dr Lavone Orn    Encounter Date: 08/21/2020   PT End of Session - 08/21/20 0939    Visit Number 7    Number of Visits 12    Date for PT Re-Evaluation 08/29/20    Authorization Type health tream advantage    PT Start Time 0930    PT Stop Time 1012    PT Time Calculation (min) 42 min    Activity Tolerance Patient tolerated treatment well    Behavior During Therapy [facility] for tasks assessed/performed           Past Medical History:  Diagnosis Date  . Arthritis   . GERD (gastroesophageal reflux disease)    watches and NO ISSUES WHEN USING CPAP--  NO MEDS  . Hiatal hernia   . History of asthma   . History of kidney stones   . History of multinodular goiter    s/p  thyroid lobectomy 1967  . Hypertension   . Irritable bowel syndrome   . Left knee DJD   . Left ureteral calculus   . Nephrolithiasis    bilateral non-obstrucive per CT 03-23-2017  . OSA on CPAP    cpap does not know settings   . Paroxysmal atrial fibrillation (HCC) newly dx 03-25-2017 (during hospitlization for urosepsis)   A-fib -- Aflutter w/ RVR -- pt started on cardizem and eliquis--- at discharge in [facility]/  cardiologist-  dr Percival Spanish    Past Surgical History:  Procedure Laterality Date  . CARPAL TUNNEL RELEASE Bilateral [place]   . CATARACT EXTRACTION W/ INTRAOCULAR LENS  IMPLANT, BILATERAL  2005  &  2012  . CYSTOSCOPY WITH STENT PLACEMENT Left 03/23/2017   Procedure: CYSTOSCOPY, LEFT RETROGRADE WITH STENT PLACEMENT;  Surgeon: Lucas Mallow, MD;  Location: [facility];  Service: Urology;  Laterality: Left;  . CYSTOSCOPY/URETEROSCOPY/HOLMIUM LASER/STENT PLACEMENT Left 04/10/2017   Procedure: CYSTOSCOPY/URETEROSCOPY/HOLMIUM  LASER/STENT PLACEMENT;  Surgeon: Festus Aloe, MD;  Location: [facility];  Service: Urology;  Laterality: Left;  . EXTRACORPOREAL SHOCK WAVE LITHOTRIPSY     MULTIPLE  . HEMIARTHROPLASTY RIGHT SHOULDER  2007  . HOLMIUM LASER APPLICATION Left 4/94/4967   Procedure: HOLMIUM LASER APPLICATION;  Surgeon: Fredricka Bonine, MD;  Location: [facility];  Service: Urology;  Laterality: Left;  . [place]   removal of stones. (OPEN PROCUDURE)  . REVERSE SHOULDER ARTHROPLASTY  10/09/2011   Procedure: REVERSE SHOULDER ARTHROPLASTY;  Surgeon: Marin Shutter, MD;  Location: [place];  Service: Orthopedics;  Laterality: Right;  right hardware removal and reverse shoulder arthroplasty  . THYROID LOBECTOMY  1967   goiter removed  . TONSILLECTOMY    . TOTAL KNEE ARTHROPLASTY  07/22/2012   Procedure: TOTAL KNEE ARTHROPLASTY;  Surgeon: Johnn Hai, MD;  Location: [facility];  Service: Orthopedics;  Laterality: Right;  . TOTAL KNEE ARTHROPLASTY Left 12/16/2012   Procedure: LEFT TOTAL KNEE ARTHROPLASTY;  Surgeon: Johnn Hai, MD;  Location: [facility];  Service: Orthopedics;  Laterality: Left;  . TRANSTHORACIC ECHOCARDIOGRAM  03/25/2017   mild LVH, ef 55-60%/  mild LAE/ trivial PR/  mild MR  . TRIGGER FINGER RELEASE Right 2006  . [place] EXTRACTION  2002    There  were no vitals filed for this visit.   Subjective Assessment - 08/21/20 0936    Subjective Patient reports that his knee has been a little sore over the past few days. He reports overall he is doing pretty good.    Pertinent History Arthritis: knees and shoulders.    Limitations Standing;Walking    How long can you stand comfortably? can stand for a good amount of time    How long can you walk comfortably? walks with cane. Has falls at times    Diagnostic tests Nothing pertinant    Patient Stated Goals to have less falls/ better balance    Currently in Pain? Yes    Pain  Score 3     Pain Location Knee    Pain Orientation Right    Pain Descriptors / Indicators Aching    Pain Type Chronic pain    Pain Onset More than a month ago    Pain Frequency Intermittent    Aggravating Factors  woke up with pain    Pain Relieving Factors comes and goes    Effect of Pain on Daily Activities pain walking                             [facility] Adult PT Treatment/Exercise - 08/21/20 0001      Neuro Re-ed    Neuro Re-ed Details  Standing walking over hurdles 3 laps. Had more difficulty lifting the left leg.Side stpping 3 laps; walking straight line 3 laps; Box steps x8 one way then 8 times back the other way.      Knee/Hip Exercises: Supine   Bridges Limitations 3x10    Straight Leg Raises Limitations 2x10 each leg  2lb    Other Supine Knee/Hip Exercises clamshell Blue 2x20    Other Supine Knee/Hip Exercises supine march x20                  PT Education - 08/21/20 3545    Education Details HEP and symptom mangement    Person(s) Educated Patient    Methods Explanation;Demonstration;Tactile cues;Verbal cues    Comprehension Verbalized understanding;Returned demonstration;Verbal cues required;Tactile cues required            PT Short Term Goals - 08/21/20 1143      PT SHORT TERM GOAL #1   Title Patient will decrease TUG time by 8 seconds    Time 3    Period Weeks    Status On-going      PT SHORT TERM GOAL #2   Title Patient will decreased 5x sit to stand time by 6 seconds    Time 3    Period Weeks    Status On-going    Target Date 06/27/20      PT SHORT TERM GOAL #3   Title Patient will require supervsion for full tandem stance    Time 3    Period Weeks    Status On-going             PT Long Term Goals - 06/06/20 6256      PT LONG TERM GOAL #1   Title Patient will turn to the left independently without loss of balance    Baseline has difficulty turning to the left    Time 6    Period Weeks    Status New    Target  Date 07/18/20      PT LONG TERM GOAL #2   Title  Patient will go up down 6 steps independently without lob    Time 6    Period Weeks    Status New    Target Date 07/18/20      PT LONG TERM GOAL #3   Title Patient will be indpednent with balance and gross strengthening program    Time 6    Period Weeks    Status New    Target Date 07/18/20                 Plan - 08/21/20 1141    Clinical Impression Statement Therapy eworked on patella mobility today to see if it helps his knee pain. He has mild limitations. We also worked on exercises that focused more on movement. He had more difficulty lifting his left leg up. We will continue to progress as tolerated.    Personal Factors and Comorbidities Age;Comorbidity 1;Comorbidity 3+;Comorbidity 2    Comorbidities neuropathy, knee OA, Left shoulder OA    Examination-Activity Limitations Squat;Stairs;Locomotion Level    Examination-Participation Restrictions Community Activity;Laundry;Meal Prep;Occupation    Stability/Clinical Decision Making Evolving/Moderate complexity    Clinical Decision Making Moderate    Rehab Potential Good    PT Frequency 1x / week    PT Duration 6 weeks    PT Treatment/Interventions ADLs/Self Care Home Management;Electrical Stimulation;Cryotherapy;Iontophoresis 4mg /ml Dexamethasone;Moist Heat;Traction;Ultrasound;DME Instruction;Functional mobility training;Therapeutic activities;Therapeutic exercise;Balance training;Neuromuscular re-education;Patient/family education;Passive range of motion;Manual techniques    PT Next Visit Plan BERG testing, gaive basic Hcontinue to advance balance activity while being aware of the knee. consdier pertabations next visit.    PT Home Exercise Plan nothing given 2nd to time    Consulted and Agree with Plan of Care Patient           Patient will benefit from skilled therapeutic intervention in order to improve the following deficits and impairments:  Abnormal gait,Increased  fascial restricitons,Decreased range of motion,Pain,Decreased activity tolerance,Decreased strength,Decreased mobility  Visit Diagnosis: Repeated falls  Other abnormalities of gait and mobility     Problem List Patient Active Problem List   Diagnosis Date Noted  . Closed fracture of patella 06/06/2019  . Pain in right knee 05/12/2019  . Degenerative spondylolisthesis 04/08/2018  . Degeneration of lumbar intervertebral disc 03/18/2018  . Scoliosis of lumbar spine 03/18/2018  . Spinal stenosis of lumbar region 03/18/2018  . Mild dementia ([place]) 01/22/2018  . History of total knee replacement, right 01/21/2018  . Atrial flutter ([place]) 04/02/2017  . Anticoagulated 04/02/2017  . Right carotid bruit 04/02/2017  . Sepsis ([place]) 03/25/2017  . Pyelonephritis 03/25/2017  . Left ureteral calculus   . Ureteral stone 03/23/2017  . Nephrolithiasis 03/23/2017  . BPH (benign prostatic hypertrophy) 02/03/2013  . Extrinsic asthma, unspecified 01/27/2013  . Essential hypertension, benign 01/27/2013  . Gout, unspecified 01/27/2013  . 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 Right knee DJD 07/22/2012    Carney Living PT DPT  08/21/2020, 11:44 AM  [facility] [address] [place], [place], [address] Phone: [phone]   Fax:  [phone]  Name: JAMES SENN MRN: [id number] Date of Birth: 04-Dec-1940

## 2020-09-04 ENCOUNTER — Encounter: Payer: Self-pay | Admitting: Physical Therapy

## 2020-09-04 ENCOUNTER — Other Ambulatory Visit: Payer: Self-pay

## 2020-09-04 ENCOUNTER — Ambulatory Visit: Payer: PPO | Attending: Internal Medicine | Admitting: Physical Therapy

## 2020-09-04 DIAGNOSIS — R2689 Other abnormalities of gait and mobility: Secondary | ICD-10-CM | POA: Insufficient documentation

## 2020-09-04 DIAGNOSIS — R296 Repeated falls: Secondary | ICD-10-CM | POA: Diagnosis not present

## 2020-09-04 NOTE — Therapy (Signed)
[place], [place], [address] Phone: [phone]   Fax:  [phone]  Physical Therapy Treatment  Patient Details  Name: Darrell Perez MRN: [id number] Date of Birth: July 14, 1940 Referring Provider (PT): Dr Lavone Orn    Encounter Date: 09/04/2020   PT End of Session - 09/04/20 1024    Visit Number 8    Number of Visits 12    Date for PT Re-Evaluation 08/29/20    Authorization Type health [employer]    PT Start Time 1015    PT Stop Time 1058    PT Time Calculation (min) 43 min    Activity Tolerance Patient tolerated treatment well    Behavior During Therapy [facility] for tasks assessed/performed           Past Medical History:  Diagnosis Date  . Arthritis   . GERD (gastroesophageal reflux disease)    watches and NO ISSUES WHEN USING CPAP--  NO MEDS  . Hiatal hernia   . History of asthma   . History of kidney stones   . History of multinodular goiter    s/p  thyroid lobectomy 1967  . Hypertension   . Irritable bowel syndrome   . Left knee DJD   . Left ureteral calculus   . Nephrolithiasis    bilateral non-obstrucive per CT 03-23-2017  . OSA on CPAP    cpap does not know settings   . Paroxysmal atrial fibrillation (HCC) newly dx 03-25-2017 (during hospitlization for urosepsis)   A-fib -- Aflutter w/ RVR -- pt started on cardizem and eliquis--- at discharge in [facility]/  cardiologist-  dr Percival Spanish    Past Surgical History:  Procedure Laterality Date  . CARPAL TUNNEL RELEASE Bilateral [place]   . CATARACT EXTRACTION W/ INTRAOCULAR LENS  IMPLANT, BILATERAL  2005  &  2012  . CYSTOSCOPY WITH STENT PLACEMENT Left 03/23/2017   Procedure: CYSTOSCOPY, LEFT RETROGRADE WITH STENT PLACEMENT;  Surgeon: Lucas Mallow, MD;  Location: [facility];  Service: Urology;  Laterality: Left;  . CYSTOSCOPY/URETEROSCOPY/HOLMIUM LASER/STENT PLACEMENT Left 04/10/2017   Procedure: CYSTOSCOPY/URETEROSCOPY/HOLMIUM  LASER/STENT PLACEMENT;  Surgeon: Festus Aloe, MD;  Location: [facility];  Service: Urology;  Laterality: Left;  . EXTRACORPOREAL SHOCK WAVE LITHOTRIPSY     MULTIPLE  . HEMIARTHROPLASTY RIGHT SHOULDER  2007  . HOLMIUM LASER APPLICATION Left 06/30/7508   Procedure: HOLMIUM LASER APPLICATION;  Surgeon: Fredricka Bonine, MD;  Location: [facility];  Service: Urology;  Laterality: Left;  . [place]   removal of stones. (OPEN PROCUDURE)  . REVERSE SHOULDER ARTHROPLASTY  10/09/2011   Procedure: REVERSE SHOULDER ARTHROPLASTY;  Surgeon: Marin Shutter, MD;  Location: [place];  Service: Orthopedics;  Laterality: Right;  right hardware removal and reverse shoulder arthroplasty  . THYROID LOBECTOMY  1967   goiter removed  . TONSILLECTOMY    . TOTAL KNEE ARTHROPLASTY  07/22/2012   Procedure: TOTAL KNEE ARTHROPLASTY;  Surgeon: Johnn Hai, MD;  Location: [facility];  Service: Orthopedics;  Laterality: Right;  . TOTAL KNEE ARTHROPLASTY Left 12/16/2012   Procedure: LEFT TOTAL KNEE ARTHROPLASTY;  Surgeon: Johnn Hai, MD;  Location: [facility];  Service: Orthopedics;  Laterality: Left;  . TRANSTHORACIC ECHOCARDIOGRAM  03/25/2017   mild LVH, ef 55-60%/  mild LAE/ trivial PR/  mild MR  . TRIGGER FINGER RELEASE Right 2006  . [place] EXTRACTION  2002    There  were no vitals filed for this visit.   Subjective Assessment - 09/04/20 1022    Subjective Patient has no compllaits. he reports he had a couple of near falls but overall he has been able to correct.    Pertinent History Arthritis: knees and shoulders.    How long can you stand comfortably? can stand for a good amount of time    How long can you walk comfortably? walks with cane. Has falls at times    Diagnostic tests Nothing pertinant    Patient Stated Goals to have less falls/ better balance    Currently in Pain? No/denies                              [facility] Adult PT Treatment/Exercise - 09/04/20 0001      High Level Balance   High Level Balance Comments tandem walk in II bars 3 laps; hurdles 2x10; narrow base on air-ex eyes iopened and closed no lob notedp; tandem stance eyes opened and closed 3x20 sec each leg; box stepping clock wise and counter clockwse.      Neuro Re-ed    Neuro Re-ed Details  Standing walking over hurdles 3 laps. Had more difficulty lifting the left leg.Side stpping 3 laps; walking straight line 3 laps; Box steps x8 one way then 8 times back the other way.      Knee/Hip Exercises: Supine   Straight Leg Raises Limitations 2x10 each leg  2lb    Other Supine Knee/Hip Exercises clamshell Blue 2x20    Other Supine Knee/Hip Exercises supine march x20                  PT Education - 09/04/20 1024    Education Details reviewdd balance exefrcises for home    Person(s) Educated Patient    Methods Explanation;Demonstration;Tactile cues;Verbal cues    Comprehension Verbalized understanding;Returned demonstration;Verbal cues required;Tactile cues required            PT Short Term Goals - 08/21/20 1143      PT SHORT TERM GOAL #1   Title Patient will decrease TUG time by 8 seconds    Time 3    Period Weeks    Status On-going      PT SHORT TERM GOAL #2   Title Patient will decreased 5x sit to stand time by 6 seconds    Time 3    Period Weeks    Status On-going    Target Date 06/27/20      PT SHORT TERM GOAL #3   Title Patient will require supervsion for full tandem stance    Time 3    Period Weeks    Status On-going             PT Long Term Goals - 06/06/20 2725      PT LONG TERM GOAL #1   Title Patient will turn to the left independently without loss of balance    Baseline has difficulty turning to the left    Time 6    Period Weeks    Status New    Target Date 07/18/20      PT LONG TERM GOAL #2   Title Patient will go up down 6 steps independently without lob    Time 6    Period Weeks     Status New    Target Date 07/18/20      PT LONG TERM GOAL #3  Title Patient will be indpednent with balance and gross strengthening program    Time 6    Period Weeks    Status New    Target Date 07/18/20                 Plan - 09/04/20 1106    Clinical Impression Statement Patient is makingg good progress. he reported his knee is a little sore because he got down on the ground yesterday. He deomstrated good balance on the aitr-ex with eyes closed today. he caught his foot a few times with the hurdles but otherwise did well with mobility activity.    Comorbidities neuropathy, knee OA, Left shoulder OA    Examination-Activity Limitations Squat;Stairs;Locomotion Level    Examination-Participation Restrictions Community Activity;Laundry;Meal Prep;Occupation    Stability/Clinical Decision Making Evolving/Moderate complexity    Clinical Decision Making Moderate    Rehab Potential Good    PT Frequency 1x / week    PT Duration 6 weeks    PT Treatment/Interventions ADLs/Self Care Home Management;Electrical Stimulation;Cryotherapy;Iontophoresis 4mg /ml Dexamethasone;Moist Heat;Traction;Ultrasound;DME Instruction;Functional mobility training;Therapeutic activities;Therapeutic exercise;Balance training;Neuromuscular re-education;Patient/family education;Passive range of motion;Manual techniques           Patient will benefit from skilled therapeutic intervention in order to improve the following deficits and impairments:  Abnormal gait,Increased fascial restricitons,Decreased range of motion,Pain,Decreased activity tolerance,Decreased strength,Decreased mobility  Visit Diagnosis: Repeated falls  Other abnormalities of gait and mobility     Problem List Patient Active Problem List   Diagnosis Date Noted  . Closed fracture of patella 06/06/2019  . Pain in right knee 05/12/2019  . Degenerative spondylolisthesis 04/08/2018  . Degeneration of lumbar intervertebral disc 03/18/2018   . Scoliosis of lumbar spine 03/18/2018  . Spinal stenosis of lumbar region 03/18/2018  . Mild dementia ([place]) 01/22/2018  . History of total knee replacement, right 01/21/2018  . Atrial flutter ([place]) 04/02/2017  . Anticoagulated 04/02/2017  . Right carotid bruit 04/02/2017  . Sepsis ([place]) 03/25/2017  . Pyelonephritis 03/25/2017  . Left ureteral calculus   . Ureteral stone 03/23/2017  . Nephrolithiasis 03/23/2017  . BPH (benign prostatic hypertrophy) 02/03/2013  . Extrinsic asthma, unspecified 01/27/2013  . Essential hypertension, benign 01/27/2013  . Gout, unspecified 01/27/2013  . 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 Right knee DJD 07/22/2012    Carney Living PT DPT  09/04/2020, 3:29 PM  [facility] [address] [place], [place], [address] Phone: [phone]   Fax:  [phone]  Name: Darrell Perez MRN: [id number] Date of Birth: 05/20/1941

## 2020-09-11 ENCOUNTER — Ambulatory Visit: Payer: PPO | Admitting: Physical Therapy

## 2020-09-11 ENCOUNTER — Other Ambulatory Visit: Payer: Self-pay

## 2020-09-11 DIAGNOSIS — R296 Repeated falls: Secondary | ICD-10-CM

## 2020-09-11 DIAGNOSIS — R2689 Other abnormalities of gait and mobility: Secondary | ICD-10-CM

## 2020-09-11 NOTE — Therapy (Signed)
[place], [place], [address] Phone: [phone]   Fax:  [phone]  Physical Therapy Treatment  Patient Details  Name: Darrell Perez MRN: [id number] Date of Birth: [birth year] Referring Provider (PT): Dr Lavone Orn    Encounter Date: 09/11/2020   PT End of Session - 09/11/20 1030    Visit Number 9    Number of Visits 12    Date for PT Re-Evaluation 08/29/20    PT Start Time 1761    PT Stop Time 1056    PT Time Calculation (min) 41 min    Activity Tolerance Patient tolerated treatment well    Behavior During Therapy [facility] for tasks assessed/performed           Past Medical History:  Diagnosis Date  . Arthritis   . GERD (gastroesophageal reflux disease)    watches and NO ISSUES WHEN USING CPAP--  NO MEDS  . Hiatal hernia   . History of asthma   . History of kidney stones   . History of multinodular goiter    s/p  thyroid lobectomy 1967  . Hypertension   . Irritable bowel syndrome   . Left knee DJD   . Left ureteral calculus   . Nephrolithiasis    bilateral non-obstrucive per CT 03-23-2017  . OSA on CPAP    cpap does not know settings   . Paroxysmal atrial fibrillation (HCC) newly dx 03-25-2017 (during hospitlization for urosepsis)   A-fib -- Aflutter w/ RVR -- pt started on cardizem and eliquis--- at discharge in [facility]/  cardiologist-  dr Percival Spanish    Past Surgical History:  Procedure Laterality Date  . CARPAL TUNNEL RELEASE Bilateral [place]   . CATARACT EXTRACTION W/ INTRAOCULAR LENS  IMPLANT, BILATERAL  2005  &  2012  . CYSTOSCOPY WITH STENT PLACEMENT Left 03/23/2017   Procedure: CYSTOSCOPY, LEFT RETROGRADE WITH STENT PLACEMENT;  Surgeon: Lucas Mallow, MD;  Location[facility];  Service: Urology;  Laterality: Left;  . CYSTOSCOPY/URETEROSCOPY/HOLMIUM LASER/STENT PLACEMENT Left 04/10/2017   Procedure: CYSTOSCOPY/URETEROSCOPY/HOLMIUM LASER/STENT PLACEMENT;  Surgeon: Festus Aloe, MD;  Location: [facility];  Service: Urology;  Laterality: Left;  . EXTRACORPOREAL SHOCK WAVE LITHOTRIPSY     MULTIPLE  . HEMIARTHROPLASTY RIGHT SHOULDER  2007  . HOLMIUM LASER APPLICATION Left 12/04/3708   Procedure: HOLMIUM LASER APPLICATION;  Surgeon: Fredricka Bonine, MD;  Location: [facility];  Service: Urology;  Laterality: Left;  . [place]   removal of stones. (OPEN PROCUDURE)  . REVERSE SHOULDER ARTHROPLASTY  10/09/2011   Procedure: REVERSE SHOULDER ARTHROPLASTY;  Surgeon: Marin Shutter, MD;  Location: [place];  Service: Orthopedics;  Laterality: Right;  right hardware removal and reverse shoulder arthroplasty  . THYROID LOBECTOMY  1967   goiter removed  . TONSILLECTOMY    . TOTAL KNEE ARTHROPLASTY  07/22/2012   Procedure: TOTAL KNEE ARTHROPLASTY;  Surgeon: Johnn Hai, MD;  Location: [facility];  Service: Orthopedics;  Laterality: Right;  . TOTAL KNEE ARTHROPLASTY Left 12/16/2012   Procedure: LEFT TOTAL KNEE ARTHROPLASTY;  Surgeon: Johnn Hai, MD;  Location[facility];  Service: Orthopedics;  Laterality: Left;  . TRANSTHORACIC ECHOCARDIOGRAM  03/25/2017   mild LVH, ef 55-60%/  mild LAE/ trivial PR/  mild MR  . TRIGGER FINGER RELEASE Right 2006  . [place] EXTRACTION  2002    There were no vitals filed for this visit.  Subjective Assessment - 09/11/20 1032    Subjective Patient reports his knee has been hurting a bit over the past week. He is not sure the cause. Overall he has been getting saround prtty good.    Pertinent History Arthritis: knees and shoulders.    Limitations Standing;Walking    How long can you stand comfortably? can stand for a good amount of time    How long can you walk comfortably? walks with cane. Has falls at times    Diagnostic tests Nothing pertinant    Patient Stated Goals to have less falls/ better balance                                        PT Short Term Goals - 08/21/20 1143      PT SHORT TERM GOAL #1   Title Patient will decrease TUG time by 8 seconds    Time 3    Period Weeks    Status On-going      PT SHORT TERM GOAL #2   Title Patient will decreased 5x sit to stand time by 6 seconds    Time 3    Period Weeks    Status On-going    Target Date 06/27/20      PT SHORT TERM GOAL #3   Title Patient will require supervsion for full tandem stance    Time 3    Period Weeks    Status On-going             PT Long Term Goals - 06/06/20 0948      PT LONG TERM GOAL #1   Title Patient will turn to the left independently without loss of balance    Baseline has difficulty turning to the left    Time 6    Period Weeks    Status New    Target Date 07/18/20      PT LONG TERM GOAL #2   Title Patient will go up down 6 steps independently without lob    Time 6    Period Weeks    Status New    Target Date 07/18/20      PT LONG TERM GOAL #3   Title Patient will be indpednent with balance and gross strengthening program    Time 6    Period Weeks    Status New    Target Date 07/18/20                 Plan - 09/11/20 1048    Clinical Impression Statement Therapy reviewed final HEP with patient. It was thne noted that he had 1 more visit. He was shown basic knee strengthening to work on. He was also shown standing balance exercises and how to do safely. He has 1 more visit. At this time we will review HEP with him and continue to work on challenging his balance.    Personal Factors and Comorbidities Age;Comorbidity 1;Comorbidity 3+;Comorbidity 2    Comorbidities neuropathy, knee OA, Left shoulder OA    Examination-Activity Limitations Squat;Stairs;Locomotion Level    Examination-Participation Restrictions Community Activity;Laundry;Meal Prep;Occupation    Stability/Clinical Decision Making Evolving/Moderate complexity    Clinical Decision Making  Moderate    Rehab Potential Good    PT Frequency 1x / week    PT Duration 6 weeks    PT Treatment/Interventions ADLs/Self Care Home Management;Electrical Stimulation;Cryotherapy;Iontophoresis 4mg /ml Dexamethasone;Moist Heat;Traction;Ultrasound;DME Instruction;Functional mobility  training;Therapeutic activities;Therapeutic exercise;Balance training;Neuromuscular re-education;Patient/family education;Passive range of motion;Manual techniques    PT Next Visit Plan BERG testing, gaive basic Hcontinue to advance balance activity while being aware of the knee. consdier pertabations next visit.    PT Home Exercise Plan nothing given 2nd to time           Patient will benefit from skilled therapeutic intervention in order to improve the following deficits and impairments:  Abnormal gait,Increased fascial restricitons,Decreased range of motion,Pain,Decreased activity tolerance,Decreased strength,Decreased mobility  Visit Diagnosis: Repeated falls  Other abnormalities of gait and mobility     Problem List Patient Active Problem List   Diagnosis Date Noted  . Closed fracture of patella 06/06/2019  . Pain in right knee 05/12/2019  . Degenerative spondylolisthesis 04/08/2018  . Degeneration of lumbar intervertebral disc 03/18/2018  . Scoliosis of lumbar spine 03/18/2018  . Spinal stenosis of lumbar region 03/18/2018  . Mild dementia ([place]) 01/22/2018  . History of total knee replacement, right 01/21/2018  . Atrial flutter ([place]) 04/02/2017  . Anticoagulated 04/02/2017  . Right carotid bruit 04/02/2017  . Sepsis ([place]) 03/25/2017  . Pyelonephritis 03/25/2017  . Left ureteral calculus   . Ureteral stone 03/23/2017  . Nephrolithiasis 03/23/2017  . BPH (benign prostatic hypertrophy) 02/03/2013  . Extrinsic asthma, unspecified 01/27/2013  . Essential hypertension, benign 01/27/2013  . Gout, unspecified 01/27/2013  . 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 Right knee DJD 07/22/2012    Carney Living  PT DPT  09/11/2020, 2:03 PM  [facility] [address] [place], [place], [address] Phone: [phone]   Fax:  [phone]  Name: BRENN DEZIEL MRN: [id number] Date of Birth: [birth year]

## 2020-09-18 ENCOUNTER — Encounter: Payer: Self-pay | Admitting: Physical Therapy

## 2020-09-18 ENCOUNTER — Other Ambulatory Visit: Payer: Self-pay

## 2020-09-18 ENCOUNTER — Ambulatory Visit: Payer: PPO | Admitting: Physical Therapy

## 2020-09-18 DIAGNOSIS — R2689 Other abnormalities of gait and mobility: Secondary | ICD-10-CM

## 2020-09-18 DIAGNOSIS — R296 Repeated falls: Secondary | ICD-10-CM | POA: Diagnosis not present

## 2020-09-18 NOTE — Therapy (Signed)
[place], [place], [address] Phone: [phone]   Fax:  [phone]  Physical Therapy Treatment/Discharge   Patient Details  Name: Darrell Perez MRN: [id number] Date of Birth: [birth year] Referring Provider (PT): Dr Lavone Orn   Progress Note Reporting Periodto 06/06/2021 to  09/18/2020  See note below for Objective Data and Assessment of Progress/Goals.       Encounter Date: 09/18/2020   PT End of Session - 09/18/20 1022    Visit Number 10    Number of Visits 12    Date for PT Re-Evaluation 09/18/20    Authorization Type health t[employer]    PT Start Time 1015    PT Stop Time 1058    PT Time Calculation (min) 43 min    Activity Tolerance Patient tolerated treatment well    Behavior During Therapy WFL for tasks assessed/performed           Past Medical History:  Diagnosis Date  . Arthritis   . GERD (gastroesophageal reflux disease)    watches and NO ISSUES WHEN USING CPAP--  NO MEDS  . Hiatal hernia   . History of asthma   . History of kidney stones   . History of multinodular goiter    s/p  thyroid lobectomy 1967  . Hypertension   . Irritable bowel syndrome   . Left knee DJD   . Left ureteral calculus   . Nephrolithiasis    bilateral non-obstrucive per CT 03-23-2017  . OSA on CPAP    cpap does not know settings   . Paroxysmal atrial fibrillation (HCC) newly dx 03-25-2017 (during hospitlization for urosepsis)   A-fib -- Aflutter w/ RVR -- pt started on cardizem and eliquis--- at discharge in [facility]/  cardiologist-  dr Percival Spanish    Past Surgical History:  Procedure Laterality Date  . CARPAL TUNNEL RELEASE Bilateral [place]   . CATARACT EXTRACTION W/ INTRAOCULAR LENS  IMPLANT, BILATERAL  2005  &  2012  . CYSTOSCOPY WITH STENT PLACEMENT Left 03/23/2017   Procedure: CYSTOSCOPY, LEFT RETROGRADE WITH STENT PLACEMENT;  Surgeon: Lucas Mallow, MD;  Location: [facility];  Service:  Urology;  Laterality: Left;  . CYSTOSCOPY/URETEROSCOPY/HOLMIUM LASER/STENT PLACEMENT Left 04/10/2017   Procedure: CYSTOSCOPY/URETEROSCOPY/HOLMIUM LASER/STENT PLACEMENT;  Surgeon: Festus Aloe, MD;  Location: [facility];  Service: Urology;  Laterality: Left;  . EXTRACORPOREAL SHOCK WAVE LITHOTRIPSY     MULTIPLE  . HEMIARTHROPLASTY RIGHT SHOULDER  2007  . HOLMIUM LASER APPLICATION Left 5/46/5681   Procedure: HOLMIUM LASER APPLICATION;  Surgeon: Fredricka Bonine, MD;  Location: [facility];  Service: Urology;  Laterality: Left;  . [place]   removal of stones. (OPEN PROCUDURE)  . REVERSE SHOULDER ARTHROPLASTY  10/09/2011   Procedure: REVERSE SHOULDER ARTHROPLASTY;  Surgeon: Marin Shutter, MD;  Location: [place];  Service: Orthopedics;  Laterality: Right;  right hardware removal and reverse shoulder arthroplasty  . THYROID LOBECTOMY  1967   goiter removed  . TONSILLECTOMY    . TOTAL KNEE ARTHROPLASTY  07/22/2012   Procedure: TOTAL KNEE ARTHROPLASTY;  Surgeon: Johnn Hai, MD;  Location: [facility];  Service: Orthopedics;  Laterality: Right;  . TOTAL KNEE ARTHROPLASTY Left 12/16/2012   Procedure: LEFT TOTAL KNEE ARTHROPLASTY;  Surgeon: Johnn Hai, MD;  Location: [facility];  Service: Orthopedics;  Laterality: Left;  . TRANSTHORACIC ECHOCARDIOGRAM  03/25/2017   mild LVH, ef 55-60%/  mild LAE/ trivial PR/  mild MR  . TRIGGER FINGER RELEASE Right 2006  . [place] EXTRACTION  2002    There were no vitals filed for this visit.   Subjective Assessment - 09/18/20 1019    Subjective Patient report he has had a few staggers over the past few days but no actual falls. He reports his knee has been sore. he was up on it a lot the last visit.    Pertinent History Arthritis: knees and shoulders.    Limitations Standing;Walking    How long can you stand comfortably? can stand for a good amount of time    How long can you walk  comfortably? walks with cane. Has falls at times    Diagnostic tests Nothing pertinant    Patient Stated Goals to have less falls/ better balance    Currently in Pain? Yes    Pain Score 4     Pain Location Knee    Pain Orientation Right    Pain Type Chronic pain    Pain Onset More than a month ago    Pain Frequency Intermittent    Aggravating Factors  standing and walking    Pain Relieving Factors pain comes and goes    Effect of Pain on Daily Activities pain walking              [facility] PT Assessment - 09/18/20 0001      Observation/Other Assessments   Focus on Therapeutic Outcomes (FOTO)  54% limitation      Strength   Right Hip Flexion 4+/5    Right Hip ABduction 4+/5    Right Hip ADduction 4+/5    Left Hip Flexion 4+/5    Left Hip ABduction 4+/5    Right Knee Flexion 4+/5    Right Knee Extension 4+/5    Left Knee Flexion 5/5    Left Knee Extension 5/5      Transfers   Comments improved transfer from sit to stand                         [facility] Adult PT Treatment/Exercise - 09/18/20 0001      High Level Balance   High Level Balance Comments tandem walk in II bars 3 laps; hurdles 2x10; narrow base on air-ex eyes iopened and closed no lob noted; tandem stance eyes opened and closed 3x20 sec each leg; box stepping clock wise and counter clockwse.      Self-Care   Other Self-Care Comments  reviewed how to use exercises at home; reviewwed things to look for if his balance starts to decrease. talked topatient about left leg numbness. Advised him to talk to MD.      Knee/Hip Exercises: Standing   Other Standing Knee Exercises reviewed how marching can be both a balance activity and an exercises. reviewed how to use to practivce getting his feet up;      Knee/Hip Exercises: Supine   Bridges Limitations 2x10    Straight Leg Raises Limitations 2x10 each leg  2lb    Other Supine Knee/Hip Exercises clamshell Blue 2x20    Other Supine Knee/Hip Exercises supine  march x20                  PT Education - 09/18/20 1021    Education Details HEP and symptom mangement    Person(s) Educated Patient    Methods Explanation;Demonstration;Tactile cues;Verbal cues    Comprehension Verbalized understanding;Returned demonstration;Verbal cues  required;Tactile cues required            PT Short Term Goals - 08/21/20 1143      PT SHORT TERM GOAL #1   Title Patient will decrease TUG time by 8 seconds    Time 3    Period Weeks    Status On-going      PT SHORT TERM GOAL #2   Title Patient will decreased 5x sit to stand time by 6 seconds    Time 3    Period Weeks    Status On-going    Target Date 06/27/20      PT SHORT TERM GOAL #3   Title Patient will require supervsion for full tandem stance    Time 3    Period Weeks    Status On-going             PT Long Term Goals - 06/06/20 0948      PT LONG TERM GOAL #1   Title Patient will turn to the left independently without loss of balance    Baseline has difficulty turning to the left    Time 6    Period Weeks    Status New    Target Date 07/18/20      PT LONG TERM GOAL #2   Title Patient will go up down 6 steps independently without lob    Time 6    Period Weeks    Status New    Target Date 07/18/20      PT LONG TERM GOAL #3   Title Patient will be indpednent with balance and gross strengthening program    Time 6    Period Weeks    Status New    Target Date 07/18/20                 Plan - 09/18/20 1027    Clinical Impression Statement Patient has reached max benefit for therapy at this time. He has some losses of balance but has been able to catvch himself. He continues to have knee pain. We reviewed his overall plan for his knee pain over time. it is corelated to his activity level. He was encouraged to continue to be active at home. He was advised to talk to MD if he continues to have intermittent numbness in his left leg.    Personal Factors and Comorbidities  Age;Comorbidity 1;Comorbidity 3+;Comorbidity 2    Comorbidities neuropathy, knee OA, Left shoulder OA    Examination-Participation Restrictions Community Activity;Laundry;Meal Prep;Occupation    Stability/Clinical Decision Making Evolving/Moderate complexity    Clinical Decision Making Moderate    Rehab Potential Good    PT Frequency 1x / week    PT Duration 6 weeks    PT Treatment/Interventions ADLs/Self Care Home Management;Electrical Stimulation;Cryotherapy;Iontophoresis 4mg /ml Dexamethasone;Moist Heat;Traction;Ultrasound;DME Instruction;Functional mobility training;Therapeutic activities;Therapeutic exercise;Balance training;Neuromuscular re-education;Patient/family education;Passive range of motion;Manual techniques    PT Next Visit Plan BERG testing, gaive basic Hcontinue to advance balance activity while being aware of the knee. consdier pertabations next visit.    PT Home Exercise Plan nothing given 2nd to time    Consulted and Agree with Plan of Care Patient           Patient will benefit from skilled therapeutic intervention in order to improve the following deficits and impairments:  Abnormal gait,Increased fascial restricitons,Decreased range of motion,Pain,Decreased activity tolerance,Decreased strength,Decreased mobility  Visit Diagnosis: Repeated falls  Other abnormalities of gait and mobility     Problem List Patient Active Problem  List   Diagnosis Date Noted  . Closed fracture of patella 06/06/2019  . Pain in right knee 05/12/2019  . Degenerative spondylolisthesis 04/08/2018  . Degeneration of lumbar intervertebral disc 03/18/2018  . Scoliosis of lumbar spine 03/18/2018  . Spinal stenosis of lumbar region 03/18/2018  . Mild dementia ([place]) 01/22/2018  . History of total knee replacement, right 01/21/2018  . Atrial flutter ([place]) 04/02/2017  . Anticoagulated 04/02/2017  . Right carotid bruit 04/02/2017  . Sepsis ([place]) 03/25/2017  . Pyelonephritis 03/25/2017  .  Left ureteral calculus   . Ureteral stone 03/23/2017  . Nephrolithiasis 03/23/2017  . BPH (benign prostatic hypertrophy) 02/03/2013  . Extrinsic asthma, unspecified 01/27/2013  . Essential hypertension, benign 01/27/2013  . Gout, unspecified 01/27/2013  . 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 Right knee DJD 07/22/2012    Carney Living 09/18/2020, 1:34 PM  [facility] [address] [place], [place], [address] Phone: [phone]   Fax:  [phone]  Name: OBERON HEHIR MRN: [id number] Date of Birth: April 05, 1941

## 2020-09-26 DIAGNOSIS — F0390 Unspecified dementia without behavioral disturbance: Secondary | ICD-10-CM | POA: Diagnosis not present

## 2020-09-26 DIAGNOSIS — M170 Bilateral primary osteoarthritis of knee: Secondary | ICD-10-CM | POA: Diagnosis not present

## 2020-09-26 DIAGNOSIS — N400 Enlarged prostate without lower urinary tract symptoms: Secondary | ICD-10-CM | POA: Diagnosis not present

## 2020-09-26 DIAGNOSIS — I480 Paroxysmal atrial fibrillation: Secondary | ICD-10-CM | POA: Diagnosis not present

## 2020-09-26 DIAGNOSIS — I10 Essential (primary) hypertension: Secondary | ICD-10-CM | POA: Diagnosis not present

## 2020-09-26 DIAGNOSIS — K219 Gastro-esophageal reflux disease without esophagitis: Secondary | ICD-10-CM | POA: Diagnosis not present

## 2020-10-22 DIAGNOSIS — D1801 Hemangioma of skin and subcutaneous tissue: Secondary | ICD-10-CM | POA: Diagnosis not present

## 2020-10-22 DIAGNOSIS — Z85828 Personal history of other malignant neoplasm of skin: Secondary | ICD-10-CM | POA: Diagnosis not present

## 2020-10-22 DIAGNOSIS — D229 Melanocytic nevi, unspecified: Secondary | ICD-10-CM | POA: Diagnosis not present

## 2020-10-22 DIAGNOSIS — L308 Other specified dermatitis: Secondary | ICD-10-CM | POA: Diagnosis not present

## 2020-10-22 DIAGNOSIS — L720 Epidermal cyst: Secondary | ICD-10-CM | POA: Diagnosis not present

## 2020-10-22 DIAGNOSIS — L821 Other seborrheic keratosis: Secondary | ICD-10-CM | POA: Diagnosis not present

## 2020-10-22 DIAGNOSIS — L819 Disorder of pigmentation, unspecified: Secondary | ICD-10-CM | POA: Diagnosis not present

## 2020-10-22 DIAGNOSIS — L905 Scar conditions and fibrosis of skin: Secondary | ICD-10-CM | POA: Diagnosis not present

## 2020-10-22 DIAGNOSIS — L814 Other melanin hyperpigmentation: Secondary | ICD-10-CM | POA: Diagnosis not present

## 2020-11-06 DIAGNOSIS — R208 Other disturbances of skin sensation: Secondary | ICD-10-CM | POA: Diagnosis not present

## 2020-11-06 DIAGNOSIS — Z1389 Encounter for screening for other disorder: Secondary | ICD-10-CM | POA: Diagnosis not present

## 2020-11-06 DIAGNOSIS — Z13220 Encounter for screening for lipoid disorders: Secondary | ICD-10-CM | POA: Diagnosis not present

## 2020-11-06 DIAGNOSIS — Z0000 Encounter for general adult medical examination without abnormal findings: Secondary | ICD-10-CM | POA: Diagnosis not present

## 2020-11-06 DIAGNOSIS — I10 Essential (primary) hypertension: Secondary | ICD-10-CM | POA: Diagnosis not present

## 2020-11-06 DIAGNOSIS — I4892 Unspecified atrial flutter: Secondary | ICD-10-CM | POA: Diagnosis not present

## 2020-11-06 DIAGNOSIS — N400 Enlarged prostate without lower urinary tract symptoms: Secondary | ICD-10-CM | POA: Diagnosis not present

## 2020-11-06 DIAGNOSIS — G4733 Obstructive sleep apnea (adult) (pediatric): Secondary | ICD-10-CM | POA: Diagnosis not present

## 2020-11-06 DIAGNOSIS — M1000 Idiopathic gout, unspecified site: Secondary | ICD-10-CM | POA: Diagnosis not present

## 2020-11-06 DIAGNOSIS — K219 Gastro-esophageal reflux disease without esophagitis: Secondary | ICD-10-CM | POA: Diagnosis not present

## 2020-11-06 DIAGNOSIS — F0390 Unspecified dementia without behavioral disturbance: Secondary | ICD-10-CM | POA: Diagnosis not present

## 2020-11-21 DIAGNOSIS — F0390 Unspecified dementia without behavioral disturbance: Secondary | ICD-10-CM | POA: Diagnosis not present

## 2020-11-21 DIAGNOSIS — I10 Essential (primary) hypertension: Secondary | ICD-10-CM | POA: Diagnosis not present

## 2020-11-21 DIAGNOSIS — I480 Paroxysmal atrial fibrillation: Secondary | ICD-10-CM | POA: Diagnosis not present

## 2020-11-21 DIAGNOSIS — K219 Gastro-esophageal reflux disease without esophagitis: Secondary | ICD-10-CM | POA: Diagnosis not present

## 2020-11-21 DIAGNOSIS — N400 Enlarged prostate without lower urinary tract symptoms: Secondary | ICD-10-CM | POA: Diagnosis not present

## 2020-11-21 DIAGNOSIS — M170 Bilateral primary osteoarthritis of knee: Secondary | ICD-10-CM | POA: Diagnosis not present

## 2021-02-07 DIAGNOSIS — H26493 Other secondary cataract, bilateral: Secondary | ICD-10-CM | POA: Diagnosis not present

## 2021-02-07 DIAGNOSIS — H524 Presbyopia: Secondary | ICD-10-CM | POA: Diagnosis not present

## 2021-02-07 DIAGNOSIS — H353131 Nonexudative age-related macular degeneration, bilateral, early dry stage: Secondary | ICD-10-CM | POA: Diagnosis not present

## 2021-02-07 DIAGNOSIS — I10 Essential (primary) hypertension: Secondary | ICD-10-CM | POA: Diagnosis not present

## 2021-02-07 DIAGNOSIS — H5201 Hypermetropia, right eye: Secondary | ICD-10-CM | POA: Diagnosis not present

## 2021-02-07 DIAGNOSIS — Z961 Presence of intraocular lens: Secondary | ICD-10-CM | POA: Diagnosis not present

## 2021-02-07 DIAGNOSIS — H52223 Regular astigmatism, bilateral: Secondary | ICD-10-CM | POA: Diagnosis not present

## 2021-02-07 DIAGNOSIS — H43813 Vitreous degeneration, bilateral: Secondary | ICD-10-CM | POA: Diagnosis not present

## 2021-02-21 DIAGNOSIS — M170 Bilateral primary osteoarthritis of knee: Secondary | ICD-10-CM | POA: Diagnosis not present

## 2021-02-21 DIAGNOSIS — F0390 Unspecified dementia without behavioral disturbance: Secondary | ICD-10-CM | POA: Diagnosis not present

## 2021-02-21 DIAGNOSIS — I10 Essential (primary) hypertension: Secondary | ICD-10-CM | POA: Diagnosis not present

## 2021-02-21 DIAGNOSIS — K219 Gastro-esophageal reflux disease without esophagitis: Secondary | ICD-10-CM | POA: Diagnosis not present

## 2021-02-21 DIAGNOSIS — N400 Enlarged prostate without lower urinary tract symptoms: Secondary | ICD-10-CM | POA: Diagnosis not present

## 2021-02-21 DIAGNOSIS — I480 Paroxysmal atrial fibrillation: Secondary | ICD-10-CM | POA: Diagnosis not present

## 2021-03-07 DIAGNOSIS — R8279 Other abnormal findings on microbiological examination of urine: Secondary | ICD-10-CM | POA: Diagnosis not present

## 2021-03-07 DIAGNOSIS — R3915 Urgency of urination: Secondary | ICD-10-CM | POA: Diagnosis not present

## 2021-03-07 DIAGNOSIS — R3912 Poor urinary stream: Secondary | ICD-10-CM | POA: Diagnosis not present

## 2021-03-07 DIAGNOSIS — N401 Enlarged prostate with lower urinary tract symptoms: Secondary | ICD-10-CM | POA: Diagnosis not present

## 2021-03-07 DIAGNOSIS — N200 Calculus of kidney: Secondary | ICD-10-CM | POA: Diagnosis not present

## 2021-03-12 ENCOUNTER — Ambulatory Visit: Payer: PPO | Admitting: Physician Assistant

## 2021-03-12 ENCOUNTER — Other Ambulatory Visit: Payer: Self-pay

## 2021-03-12 ENCOUNTER — Encounter: Payer: Self-pay | Admitting: Physician Assistant

## 2021-03-12 VITALS — BP 151/69 | HR 64 | Resp 18 | Ht 69.0 in | Wt 191.0 lb

## 2021-03-12 DIAGNOSIS — F03A0 Unspecified dementia, mild, without behavioral disturbance, psychotic disturbance, mood disturbance, and anxiety: Secondary | ICD-10-CM

## 2021-03-12 DIAGNOSIS — R251 Tremor, unspecified: Secondary | ICD-10-CM

## 2021-03-12 DIAGNOSIS — F0390 Unspecified dementia without behavioral disturbance: Secondary | ICD-10-CM | POA: Diagnosis not present

## 2021-03-12 NOTE — Patient Instructions (Addendum)
Always good to see you!   1.Continue Galantamine '12mg'$  twice a day   2. Continue to monitor hand shaking  3. Follow-up in 6 months, call for any changes.   FALL PRECAUTIONS: Be cautious when walking. Scan the area for obstacles that may increase the risk of trips and falls. When getting up in the mornings, sit up at the edge of the bed for a few minutes before getting out of bed. Consider elevating the bed at the head end to avoid drop of blood pressure when getting up. Walk always in a well-lit room (use night lights in the walls). Avoid area rugs or power cords from appliances in the middle of the walkways. Use a walker or a cane if necessary and consider physical therapy for balance exercise. Get your eyesight checked regularly.  HOME SAFETY: Consider the safety of the kitchen when operating appliances like stoves, microwave oven, and blender. Consider having supervision and share cooking responsibilities until no longer able to participate in those. Accidents with firearms and other hazards in the house should be identified and addressed as well.  DRIVING: Regarding driving, in patients with progressive memory problems, driving will be impaired. We advise to have someone else do the driving if trouble finding directions or if minor accidents are reported. Independent driving assessment is available to determine safety of driving.  ABILITY TO BE LEFT ALONE: If patient is unable to contact 911 operator, consider using LifeLine, or when the need is there, arrange for someone to stay with patients. Smoking is a fire hazard, consider supervision or cessation. Risk of wandering should be assessed by caregiver and if detected at any point, supervision and safe proof recommendations should be instituted.  MEDICATION SUPERVISION: Inability to self-administer medication needs to be constantly addressed. Implement a mechanism to ensure safe administration of the medications.  RECOMMENDATIONS FOR ALL  PATIENTS WITH MEMORY PROBLEMS: 1. Continue to exercise (Recommend 30 minutes of walking everyday, or 3 hours every week) 2. Increase social interactions - continue going to [place] and enjoy social gatherings with friends and family 3. Eat healthy, avoid fried foods and eat more fruits and vegetables 4. Maintain adequate blood pressure, blood sugar, and blood cholesterol level. Reducing the risk of stroke and cardiovascular disease also helps promoting better memory. 5. Avoid stressful situations. Live a simple life and avoid aggravations. Organize your time and prepare for the next day in anticipation. 6. Sleep well, avoid any interruptions of sleep and avoid any distractions in the bedroom that may interfere with adequate sleep quality 7. Avoid sugar, avoid sweets as there is a strong link between excessive sugar intake, diabetes, and cognitive impairment The Mediterranean diet has been shown to help patients reduce the risk of progressive memory disorders and reduces cardiovascular risk. This includes eating fish, eat fruits and green leafy vegetables, nuts like almonds and hazelnuts, walnuts, and also use olive oil. Avoid fast foods and fried foods as much as possible. Avoid sweets and sugar as sugar use has been linked to worsening of memory function.  There is always a concern of gradual progression of memory problems. If this is the case, then we may need to adjust level of care according to patient needs. Support, both to the patient and caregiver, should then be put into place.

## 2021-03-12 NOTE — Progress Notes (Signed)
Assessment/Plan:    Dementia, mild, likely due to Alzheimer's Disease  There has been slow progression over the years, continue Galantamine '12mg'$  BID, he had side effects on Donepezil and Memantine in the past. MMSE today 26/30, delayed recall 0/3 . Right hand intermittent tremor/shaking without changes, none in office today, no parkinsonian signs. Continue close supervision.      Recommendations:  Discussed safety both in and out of the home.  Discussed the importance of regular daily schedule with inclusion of crossword puzzles to maintain brain function.  Continue to monitor mood by PCP Stay active at least 30 minutes at least 3 times a week.  Naps should be scheduled and should be no longer than 60 minutes and should not occur after 2 PM.  Mediterranean diet is recommended  Continue galantamine 12 mg bid Side effects were discussed Follow up in 6 months.   Case discussed with Dr. Delice Lesch who agrees with the plan     Subjective:   ED visits since last seen: none  Hospital admissions: none  JOSEMARIA TOLMAN is a 80 y.o. male   with a history of  hypertension, goiter s/p thyroid lobectomy, atrial fibrillation, with mild dementia, likely due to Alzheimer's disease,seen today in follow up.  He was last seen in our office on 08/10/2020.  Last MMSE in February 2022 was 28/30.  This patient is accompanied in the office by his wife who supplements the history.  Previous records as well as any outside records available were reviewed prior to todays visit. He  is currently on galantamine 12 mg bid, due to side effects with Aricept and memantine.  Wife reports that perhaps his memory is "a little bit worse ".  He may be repeating himself more.  For example, he has "gotten worse with dates or appointments".  He may have asked what are the plans for the following day, and then he will ask again.  He manages his own medications, his wife checks behind him, noting that he is good with taking them  regularly.  He drives very minimally to the church [place] away.  He denies getting lost.  His wife manages the finances.  He is independent with the dressing and bathing.  Sleep is fairly good, and he takes a few naps during the day.  Mood is calm, his wife denies any paranoia or hallucinations.  Denies placing objects in unusual places.  He continues to have recurrent falls, mostly mechanical, denies any dizziness or presyncope.  2 Sundays ago, he had the last mechanical fall in the garage, without hitting his head or losing consciousness.  He is not on physical therapy at this time "it did not help ".  Patient has some incontinence, if he does not get to the bathroom on time. He continues to have intermittent right hand shaking, of unknown etiology.  He describes it is happening every 10 days, lasting about 13 seconds per wife report who videotaped and showed the video to [place], but he is able to control this shaking by "making a fist, and then goes away ".  He denies any loss of consciousness, or absence, biting his tongue, drooling, foaming, , urinating, or having any body jerks with the symptoms.  He denies any headaches or vision changes, or aura.  Denies any metallic or blood taste.  No other areas of tremors.  It does not affect writing, or using utensils or drinking from cup.  He denies any neck pain or  focal numbness, tingling, weakness.  He has chronic right  knee pain       History on Initial Assessment: This is a pleasant 80 year old right-handed man with a history of hypertension, goiter s/p thyroid lobectomy, atrial fibrillation, dementia, presenting to establish care. He feels his memory is pretty good. His wife started noticing memory changes around 5 years ago where he was getting more forgetful, but worse in the past 1-2 years. He had sepsis in the Fall and things seemed to accelerate then. He had more difficulties initially when he got home, but has acclimated since then. His wife  has taken over most of the driving after he made her nervous trying to turn left onto oncoming traffic 1.5 years ago and did not seem to comprehend this was difficult. He continues to manage his own medications. There have been a few bills that he has let go too long without paying, which is unusual. He has word-finding difficulties. His wife denies any personality changes, no paranoia or hallucinations. He had abnormal dreams on Aricept and is taking Galantamine, however this is quite costly for their budget and his wife wonders if there is any true benefit from it. No side effects on galantamine.  He has low back pain and both legs feel weak. Otherwise he denies any headaches, dizziness, vision changes, dysarthria/dysphagia, neck pain, focal numbness/tingling, bowel/bladder dysfunction, anosmia, or tremors. Sleep is good. No falls. Both parents had dementia. He denies any history of significant head injuries or alcohol use.    I personally reviewed MRI brain with and without contrast done 10/2015 which did not show any acute changes. There was mild dilatation of the lateral ventricles with sparing of the temporal horns, mild diffuse atrophy and chronic microvascular disease.   Laboratory Data: TSH 03/2017 normal 1.13      PREVIOUS MEDICATIONS:   CURRENT MEDICATIONS:  Outpatient Encounter Medications as of 03/12/2021  Medication Sig   acetaminophen (TYLENOL) 500 MG tablet Take by mouth every 6 (six) hours as needed for mild pain, moderate pain, fever or headache.    allopurinol (ZYLOPRIM) 100 MG tablet Take 100 mg by mouth every morning.    cetirizine (ZYRTEC) 10 MG tablet 1 tablet   cholecalciferol (VITAMIN D) 1000 UNITS tablet Take 1,000 Units by mouth daily.   diltiazem (CARDIZEM CD) 240 MG 24 hr capsule Take 1 capsule (240 mg total) by mouth every morning. Patient needs appointment (Patient taking differently: Take 240 mg by mouth every morning. Patient needs appointment Pt takes BID)    galantamine (RAZADYNE) 12 MG tablet Take 1 tablet (12 mg total) by mouth 2 (two) times daily.   HYDROcodone-acetaminophen (NORCO/VICODIN) 5-325 MG tablet Take 1 tablet by mouth every 6 (six) hours as needed for moderate pain.   hyoscyamine (ANASPAZ) 0.125 MG TBDP disintergrating tablet Place 0.125 mg under the tongue every 6 (six) hours as needed for cramping.   indapamide (LOZOL) 2.5 MG tablet Take 2.5 mg by mouth every evening.    loperamide (IMODIUM) 2 MG capsule Take 1-2 mg by mouth as needed.   memantine (NAMENDA) 10 MG tablet Take 10 mg by mouth 2 (two) times daily.   pantoprazole (PROTONIX) 40 MG tablet Take 1 tablet by mouth daily.   polyethylene glycol (MIRALAX / GLYCOLAX) packet Take 17 g by mouth daily. (Patient taking differently: Take 17 g by mouth daily as needed.)   potassium chloride SA (K-DUR,KLOR-CON) 20 MEQ tablet Take 20 mEq by mouth 2 (two) times daily.  saccharomyces boulardii (FLORASTOR) 250 MG capsule 1 capsule   tolterodine (DETROL LA) 4 MG 24 hr capsule Take by mouth daily.   vitamin B-12 (CYANOCOBALAMIN) 1000 MCG tablet Take 1,000 mcg by mouth every morning.    No facility-administered encounter medications on file as of 03/12/2021.     Objective:     PHYSICAL EXAMINATION:    VITALS:   Vitals:   03/12/21 0737  BP: (!) 151/69  Pulse: 64  Resp: 18  SpO2: 97%  Weight: 191 lb (86.6 kg)  Height: '5\' 9"'$  (1.753 m)    GEN:  The patient appears stated age and is in NAD. HEENT:  Normocephalic, atraumatic.   Neurological examination:  General: NAD, well-groomed, appears stated age. Orientation: The patient is alert. Oriented to person, place and date and time. Unable to copy design, but able to draw a clock . Delayed recall 0/3  Cranial nerves: There is good facial symmetry.The speech is fluent and clear No aphasia or dysarthria. Fund of knowledge is appropriate. Recent and remote memory are intact. Attention and concentration are normal.  Able to name objects  and repeat phrases.  Hearing is intact to conversational tone.    Sensation: Sensation is intact to light touch throughout Motor: Strength is at least antigravity x4. Tremors: none  DTR's 1/4 in [place] Cognitive Assessment  04/07/2019 09/01/2018  Visuospatial/ Executive (0/5) - 3  Naming (0/3) - 2  Attention: Read list of digits (0/2) 2 1  Attention: Read list of letters (0/1) 1 1  Attention: Serial 7 subtraction starting at 100 (0/3) 2 2  Language: Repeat phrase (0/2) 1 0  Language : Fluency (0/1) 1 0  Abstraction (0/2) 2 2  Delayed Recall (0/5) 1 1  Orientation (0/6) 5 6  Total - 18   MMSE - Mini Mental State Exam 03/12/2021 08/10/2020 01/22/2018  Orientation to time '5 4 5  '$ Orientation to Place '5 5 5  '$ Registration '3 3 3  '$ Attention/ Calculation '5 4 4  '$ Recall 0 3 2  Language- name 2 objects '2 2 2  '$ Language- repeat '1 1 1  '$ Language- follow 3 step command '3 3 3  '$ Language- read & follow direction '1 1 1  '$ Write a sentence '1 1 1  '$ Copy design 0 1 1  Total score '26 28 28    '$ No flowsheet data found.     Movement examination: Tone: There is normal tone in the UE/LE. No cogwheeling Abnormal movements:  no tremor.  No myoclonus.  No asterixis.   Coordination:  There is no decremation with RAM's. Normal finger to nose  Gait and Station: The patient has no difficulty arising out of a deep-seated chair without the use of the hands. The patient's stride length is good.  Gait is cautious and, cautious and narrow, unsteady without a cane. Good finger taps       Total time spent on today's visit was 30 minutes, including both face-to-face time and nonface-to-face time. Time included that spent on review of records (prior notes available to me/labs/imaging if pertinent), discussing treatment and goals, answering patient's questions and coordinating care.  Cc:  Lavone Orn, MD Sharene Butters, PA-C

## 2021-03-25 ENCOUNTER — Ambulatory Visit: Payer: PPO | Admitting: Neurology

## 2021-03-27 IMAGING — CT CT HEAD W/O CM
3 series · 15 of 47 positions shown, 18 images · non-contrast
Comparison: MR head without and with contrast 11/19/2015

CLINICAL DATA: Fall.  Head trauma.

EXAM:
CT HEAD WITHOUT CONTRAST
TECHNIQUE: Contiguous axial images were obtained from the base of the skull
through the vertex without intravenous contrast.

[Series 2: head wo · axial · 0.45mm/px · z∈[-56,+84]mm · 9 of 34 slices shown, 12 images]
[im 3/34  brain]
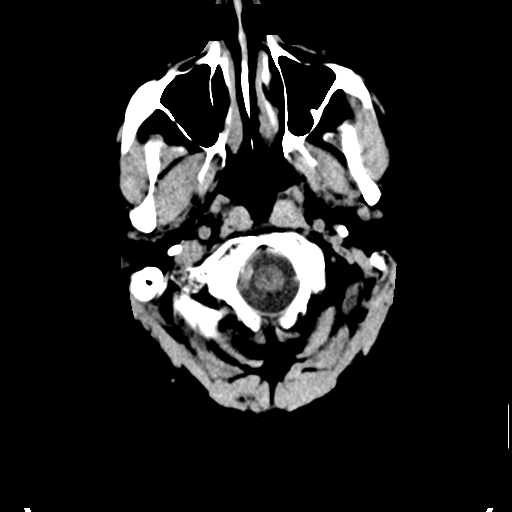
[im 3/34  bone]
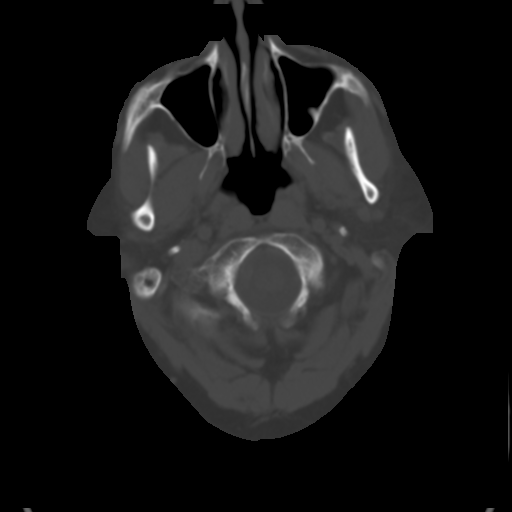
[im 6/34  brain]
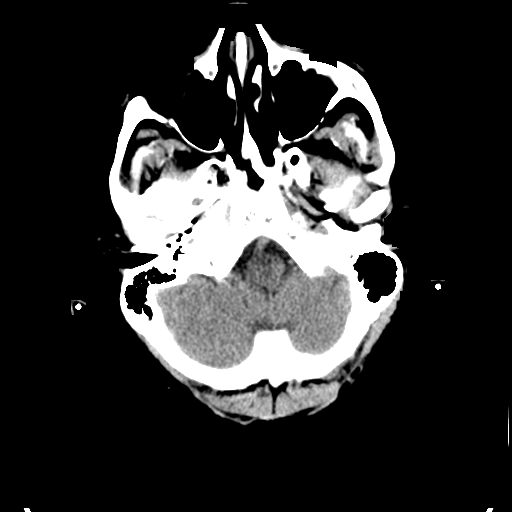
[im 10/34  brain]
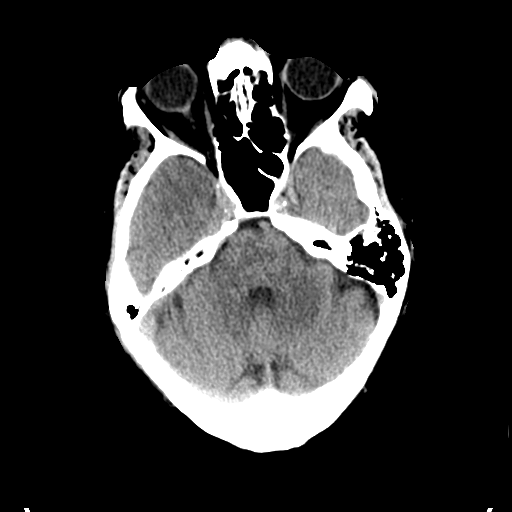
[im 13/34  brain]
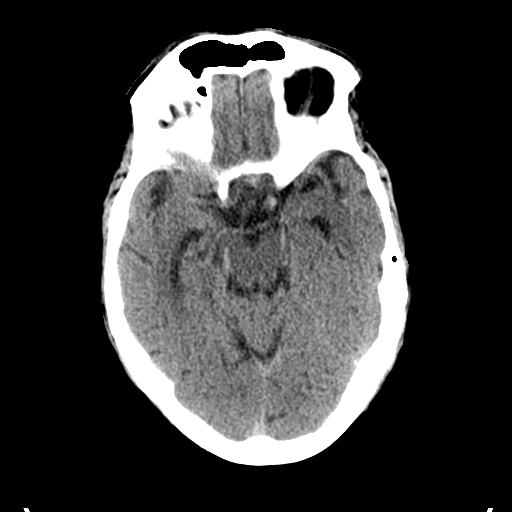
[im 18/34  brain]
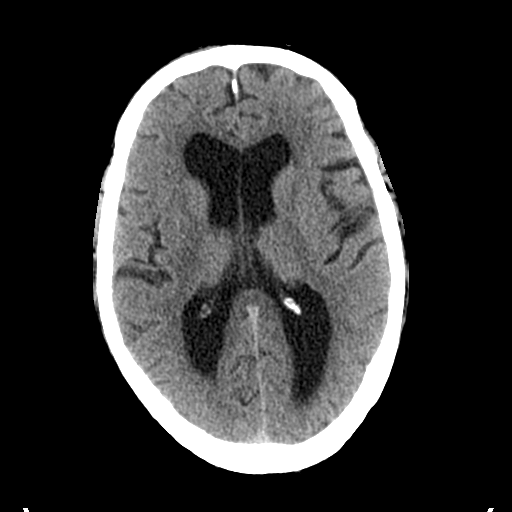
[im 18/34  bone]
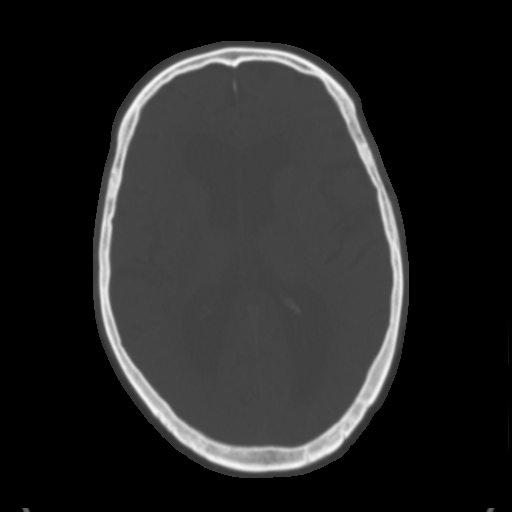
[im 21/34  brain]
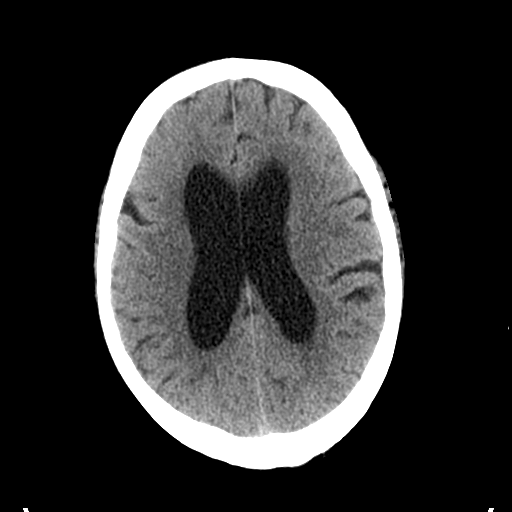
[im 24/34  brain]
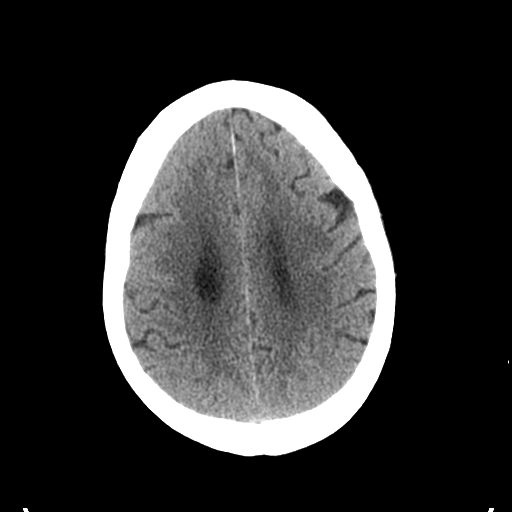
[im 28/34  brain]
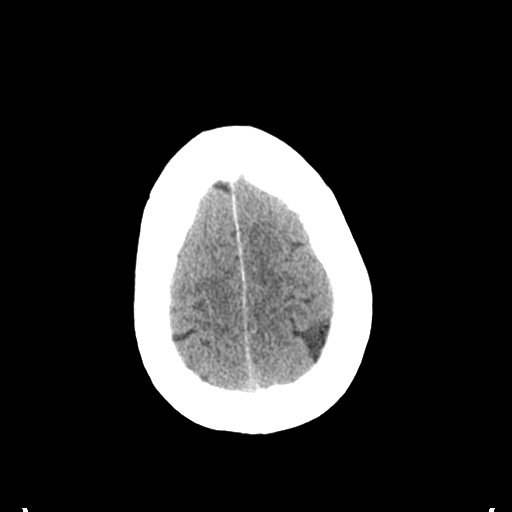
[im 31/34  brain]
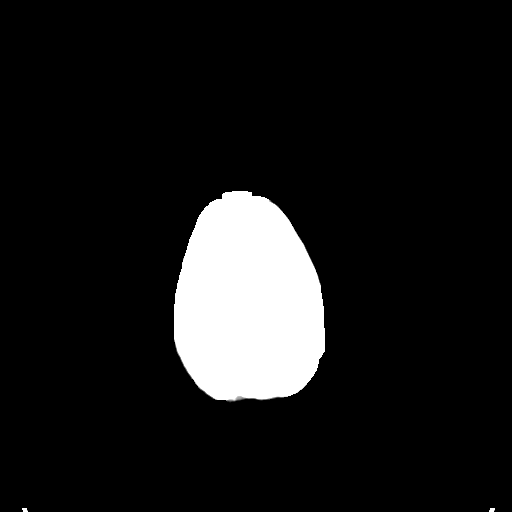
[im 31/34  bone]
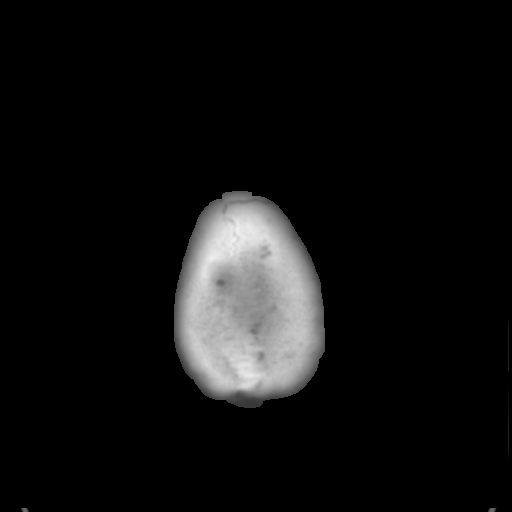

[Series 5: coronal soft tissue · coronal · 0.29mm/px · 3 of 70 slices shown]
[im 25/70  brain]
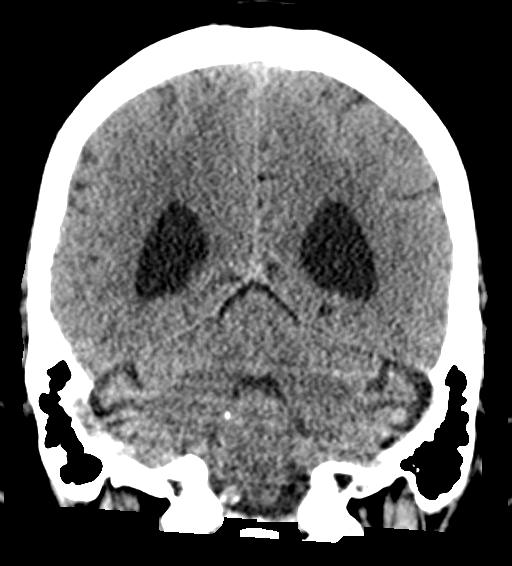
[im 32/70  brain]
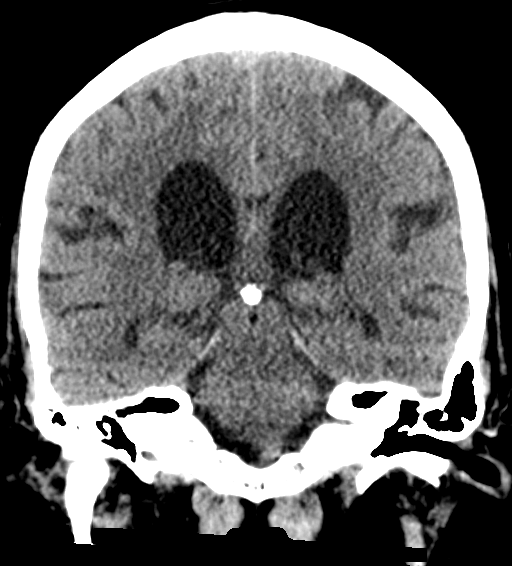
[im 39/70  brain]
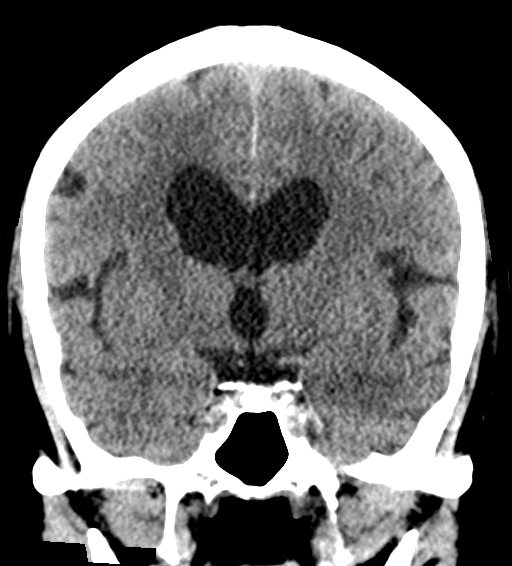

[Series 6: sagittal soft tissue · sagittal · 0.33mm/px · 3 of 51 slices shown]
[im 17/51  brain]
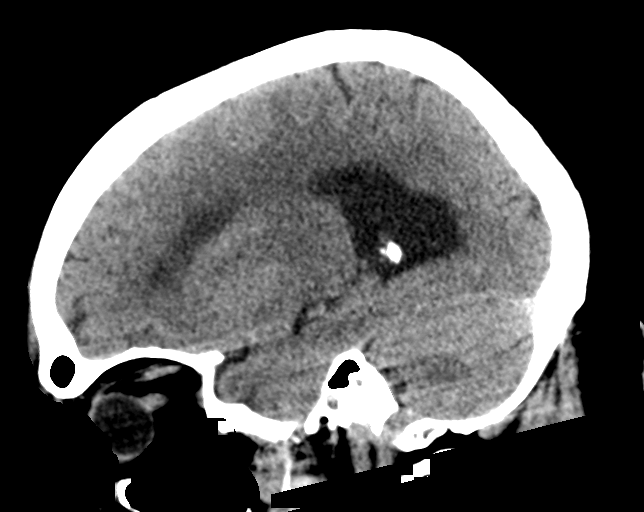
[im 26/51  brain]
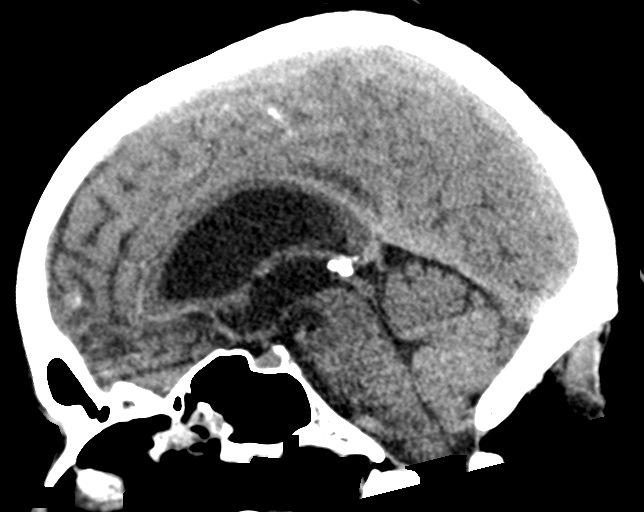
[im 34/51  brain]
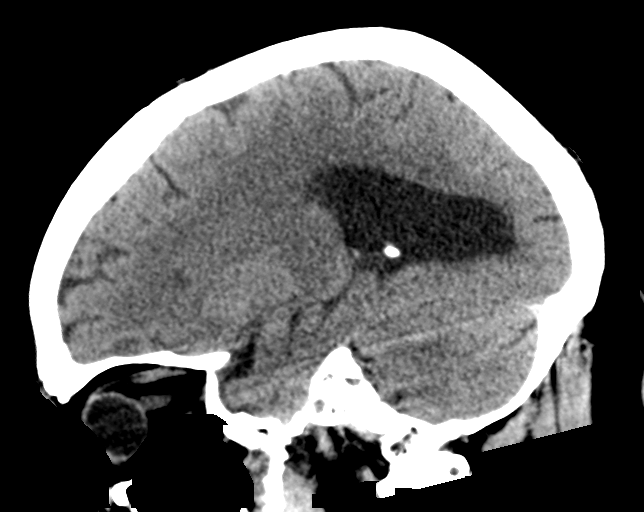

[15 of 47 positions shown; findings below may reference images not displayed]

FINDINGS: Brain: Mild generalized atrophy is similar the prior study. Mild
prominence of the ventricles is stable. There is no hydrocephalus.
No acute infarct or hemorrhage is present. No significant extraaxial
fluid collection is present. The brainstem and cerebellum are within
normal limits.

Vascular: No hyperdense vessel or unexpected calcification.

Skull: Right supraorbital scalp soft tissue swelling is present.
There is no underlying fracture.

Sinuses/Orbits: Mild mucosal thickening is present throughout the
ethmoid air cells and inferior left frontal sinus. The remaining
paranasal sinuses in the mastoid air cells are clear.
IMPRESSION: 1. Right supraorbital soft tissue swelling without underlying
fracture.
2. Stable atrophy and mild prominence of the ventricles.
3. No acute intracranial abnormality.

## 2021-04-23 DIAGNOSIS — Z08 Encounter for follow-up examination after completed treatment for malignant neoplasm: Secondary | ICD-10-CM | POA: Diagnosis not present

## 2021-04-23 DIAGNOSIS — L570 Actinic keratosis: Secondary | ICD-10-CM | POA: Diagnosis not present

## 2021-04-23 DIAGNOSIS — L812 Freckles: Secondary | ICD-10-CM | POA: Diagnosis not present

## 2021-04-23 DIAGNOSIS — L819 Disorder of pigmentation, unspecified: Secondary | ICD-10-CM | POA: Diagnosis not present

## 2021-04-23 DIAGNOSIS — D225 Melanocytic nevi of trunk: Secondary | ICD-10-CM | POA: Diagnosis not present

## 2021-04-23 DIAGNOSIS — L821 Other seborrheic keratosis: Secondary | ICD-10-CM | POA: Diagnosis not present

## 2021-04-23 DIAGNOSIS — L298 Other pruritus: Secondary | ICD-10-CM | POA: Diagnosis not present

## 2021-04-23 DIAGNOSIS — Z85828 Personal history of other malignant neoplasm of skin: Secondary | ICD-10-CM | POA: Diagnosis not present

## 2021-04-23 DIAGNOSIS — L814 Other melanin hyperpigmentation: Secondary | ICD-10-CM | POA: Diagnosis not present

## 2021-05-06 DIAGNOSIS — J069 Acute upper respiratory infection, unspecified: Secondary | ICD-10-CM | POA: Diagnosis not present

## 2021-05-15 DIAGNOSIS — K219 Gastro-esophageal reflux disease without esophagitis: Secondary | ICD-10-CM | POA: Diagnosis not present

## 2021-05-15 DIAGNOSIS — I10 Essential (primary) hypertension: Secondary | ICD-10-CM | POA: Diagnosis not present

## 2021-05-15 DIAGNOSIS — R208 Other disturbances of skin sensation: Secondary | ICD-10-CM | POA: Diagnosis not present

## 2021-05-17 DIAGNOSIS — G609 Hereditary and idiopathic neuropathy, unspecified: Secondary | ICD-10-CM | POA: Insufficient documentation

## 2021-05-17 DIAGNOSIS — M79672 Pain in left foot: Secondary | ICD-10-CM | POA: Diagnosis not present

## 2021-05-21 DIAGNOSIS — N401 Enlarged prostate with lower urinary tract symptoms: Secondary | ICD-10-CM | POA: Diagnosis not present

## 2021-05-21 DIAGNOSIS — N3941 Urge incontinence: Secondary | ICD-10-CM | POA: Diagnosis not present

## 2021-05-21 DIAGNOSIS — R3915 Urgency of urination: Secondary | ICD-10-CM | POA: Diagnosis not present

## 2021-06-12 DIAGNOSIS — U071 COVID-19: Secondary | ICD-10-CM | POA: Diagnosis not present

## 2021-06-18 DIAGNOSIS — F0390 Unspecified dementia without behavioral disturbance: Secondary | ICD-10-CM | POA: Diagnosis not present

## 2021-06-18 DIAGNOSIS — I480 Paroxysmal atrial fibrillation: Secondary | ICD-10-CM | POA: Diagnosis not present

## 2021-06-18 DIAGNOSIS — N400 Enlarged prostate without lower urinary tract symptoms: Secondary | ICD-10-CM | POA: Diagnosis not present

## 2021-06-18 DIAGNOSIS — M170 Bilateral primary osteoarthritis of knee: Secondary | ICD-10-CM | POA: Diagnosis not present

## 2021-06-18 DIAGNOSIS — K219 Gastro-esophageal reflux disease without esophagitis: Secondary | ICD-10-CM | POA: Diagnosis not present

## 2021-06-18 DIAGNOSIS — I10 Essential (primary) hypertension: Secondary | ICD-10-CM | POA: Diagnosis not present

## 2021-07-15 DIAGNOSIS — M25511 Pain in right shoulder: Secondary | ICD-10-CM | POA: Insufficient documentation

## 2021-07-23 DIAGNOSIS — Z96611 Presence of right artificial shoulder joint: Secondary | ICD-10-CM | POA: Insufficient documentation

## 2021-07-26 DIAGNOSIS — M25511 Pain in right shoulder: Secondary | ICD-10-CM | POA: Diagnosis not present

## 2021-07-26 DIAGNOSIS — Z96611 Presence of right artificial shoulder joint: Secondary | ICD-10-CM | POA: Diagnosis not present

## 2021-08-26 DIAGNOSIS — L720 Epidermal cyst: Secondary | ICD-10-CM | POA: Diagnosis not present

## 2021-08-26 DIAGNOSIS — L298 Other pruritus: Secondary | ICD-10-CM | POA: Diagnosis not present

## 2021-08-26 DIAGNOSIS — L821 Other seborrheic keratosis: Secondary | ICD-10-CM | POA: Diagnosis not present

## 2021-09-05 DIAGNOSIS — N200 Calculus of kidney: Secondary | ICD-10-CM | POA: Diagnosis not present

## 2021-09-05 DIAGNOSIS — N401 Enlarged prostate with lower urinary tract symptoms: Secondary | ICD-10-CM | POA: Diagnosis not present

## 2021-09-05 DIAGNOSIS — R350 Frequency of micturition: Secondary | ICD-10-CM | POA: Diagnosis not present

## 2021-09-09 ENCOUNTER — Other Ambulatory Visit: Payer: Self-pay | Admitting: Neurology

## 2021-09-10 ENCOUNTER — Other Ambulatory Visit: Payer: Self-pay

## 2021-09-10 ENCOUNTER — Encounter: Payer: Self-pay | Admitting: Physician Assistant

## 2021-09-10 ENCOUNTER — Ambulatory Visit: Payer: PPO | Admitting: Physician Assistant

## 2021-09-10 VITALS — BP 138/85 | HR 58 | Resp 18 | Ht 69.5 in | Wt 196.0 lb

## 2021-09-10 DIAGNOSIS — F02A0 Dementia in other diseases classified elsewhere, mild, without behavioral disturbance, psychotic disturbance, mood disturbance, and anxiety: Secondary | ICD-10-CM

## 2021-09-10 DIAGNOSIS — G301 Alzheimer's disease with late onset: Secondary | ICD-10-CM | POA: Diagnosis not present

## 2021-09-10 MED ORDER — GALANTAMINE HYDROBROMIDE 12 MG PO TABS: 12.0000 mg | ORAL_TABLET | Freq: Two times a day (BID) | ORAL | 3 refills | Status: DC

## 2021-09-10 NOTE — Progress Notes (Signed)
? ?Assessment/Plan:  ? ? ?Dementia, mild, likely due to Alzheimer's Disease ? ?There had been slow progression over the years, continue Galantamine '12mg'$  BID, as he had side effects on Donepezil and Memantine in the past.  Delayed recall today was 2/3 ( was 0/3 in 02/2021) . Right hand intermittent tremor/shaking without changes, none in office today, no parkinsonian signs. Continue close supervision.   ? ? ? Recommendations:  ? ?Discussed safety both in and out of the home.  ?Discussed the importance of regular daily schedule with inclusion of crossword puzzles to maintain brain function.  ?Continue to monitor mood by PCP ?Stay active at least 30 minutes at least 3 times a week.  ?Naps should be scheduled and should be no longer than 60 minutes and should not occur after 2 PM.  ?Mediterranean diet is recommended  ?Continue galantamine 12 mg bid Side effects were discussed ?Follow up in 6 months. ? ? ?Case discussed with Dr. Delice Lesch who agrees with the plan ? ? ? ? ?Subjective:  ? ?Darrell Perez is a 81 y.o. RH male   with a history of  hypertension, goiter s/p thyroid lobectomy, atrial fibrillation, with mild dementia, likely due to Alzheimer's disease, seen today in follow up.  He was last seen in our office on 03/12/21. Last MMSE in 02/2021 was 26/30.  This patient is accompanied in the office by his wife who supplements the history.  Previous records as well as any outside records available were reviewed prior to todays visit. He  s currently on galantamine 12 mg bid, due to side effects with Aricept and memantine. ? ?Wife reports that perhaps his memory is  about the same, although he may be more repetitive with questions and appointments. He keeps a calendar at the table which he checks frequently.  He manages his own medications, his wife checks behind him, noting that he is good with taking them regularly.  He drives very minimally to [place]  2 miles away, denies getting lost.  His wife manages the finances.   He is independent with the dressing and bathing.  Sleep is fairly good, and he takes a few naps during the day. Mood is calm, his wife denies any paranoia or hallucinations or placing objects in unusual places.   ?He continues to have recurrent falls, mostly mechanical, denies any dizziness or presyncope. Balance is "so so" unless he uses his R cane. Last fall was when walking his dog "he took off", falling on his R shoulder and eyebrow, without LOC. He is not on physical therapy at this time "it did not help ".  Patient has some incontinence, if he does not get to the bathroom on time, but his PC) placed him on meds that "help a little" ?He continues to have intermittent right hand tremors, of unknown etiology.  He describes it is happening every 10 days, lasting about 13 seconds per wife report without seizures.  He denies any loss of consciousness, or absence, biting his tongue, drooling, foaming. urinating, or having any  body jerks with the symptoms.  He denies any headaches or vision changes, or aura.  Denies any metallic or blood taste.  No other areas of tremors.  It does not affect writing, or using utensils or drinking from cup.  He denies any neck pain or focal numbness, tingling, weakness.  He has chronic right knee pain followed by Ortho. ? ?   ?  ?History on Initial Assessment: This is a pleasant 81 year  old right-handed man with a history of hypertension, goiter s/p thyroid lobectomy, atrial fibrillation, dementia, presenting to establish care. He feels his memory is pretty good. His wife started noticing memory changes around 5 years ago where he was getting more forgetful, but worse in the past 1-2 years. He had sepsis in the Fall and things seemed to accelerate then. He had more difficulties initially when he got home, but has acclimated since then. His wife has taken over most of the driving after he made her nervous trying to turn left onto oncoming traffic 1.5 years ago and did not seem to  comprehend this was difficult. He continues to manage his own medications. There have been a few bills that he has let go too long without paying, which is unusual. He has word-finding difficulties. His wife denies any personality changes, no paranoia or hallucinations. He had abnormal dreams on Aricept and is taking Galantamine, however this is quite costly for their budget and his wife wonders if there is any true benefit from it. No side effects on galantamine. ? ?He has low back pain and both legs feel weak. Otherwise he denies any headaches, dizziness, vision changes, dysarthria/dysphagia, neck pain, focal numbness/tingling, bowel/bladder dysfunction, anosmia, or tremors. Sleep is good. No falls. Both parents had dementia. He denies any history of significant head injuries or alcohol use.  ?  ?I personally reviewed MRI brain with and without contrast done 10/2015 which did not show any acute changes. There was mild dilatation of the lateral ventricles with sparing of the temporal horns, mild diffuse atrophy and chronic microvascular disease. ?  ?Laboratory Data: ?TSH 03/2017 normal 1.13 ?  ?  ? ?PREVIOUS MEDICATIONS:  ? ?CURRENT MEDICATIONS:  ?Outpatient Encounter Medications as of 09/10/2021  ?Medication Sig  ? acetaminophen (TYLENOL) 500 MG tablet Take by mouth every 6 (six) hours as needed for mild pain, moderate pain, fever or headache.   ? allopurinol (ZYLOPRIM) 100 MG tablet Take 100 mg by mouth every morning.   ? cetirizine (ZYRTEC) 10 MG tablet 1 tablet  ? cholecalciferol (VITAMIN D) 1000 UNITS tablet Take 1,000 Units by mouth daily.  ? diltiazem (CARDIZEM CD) 240 MG 24 hr capsule Take 1 capsule (240 mg total) by mouth every morning. Patient needs appointment (Patient taking differently: Take 240 mg by mouth every morning. Patient needs appointment ?Pt takes BID)  ? HYDROcodone-acetaminophen (NORCO/VICODIN) 5-325 MG tablet Take 1 tablet by mouth every 6 (six) hours as needed for moderate pain.  ?  hyoscyamine (ANASPAZ) 0.125 MG TBDP disintergrating tablet Place 0.125 mg under the tongue every 6 (six) hours as needed for cramping.  ? indapamide (LOZOL) 2.5 MG tablet Take 2.5 mg by mouth every evening.   ? loperamide (IMODIUM) 2 MG capsule Take 1-2 mg by mouth as needed.  ? pantoprazole (PROTONIX) 40 MG tablet Take 1 tablet by mouth daily.  ? potassium chloride SA (K-DUR,KLOR-CON) 20 MEQ tablet Take 20 mEq by mouth 2 (two) times daily.  ? saccharomyces boulardii (FLORASTOR) 250 MG capsule 1 capsule  ? tolterodine (DETROL LA) 4 MG 24 hr capsule Take by mouth daily.  ? vitamin B-12 (CYANOCOBALAMIN) 1000 MCG tablet Take 1,000 mcg by mouth every morning.   ? [DISCONTINUED] galantamine (RAZADYNE) 12 MG tablet Take 1 tablet (12 mg total) by mouth 2 (two) times daily.  ? galantamine (RAZADYNE) 12 MG tablet Take 1 tablet (12 mg total) by mouth 2 (two) times daily.  ? polyethylene glycol (MIRALAX / GLYCOLAX) packet Take 17 g  by mouth daily. (Patient not taking: Reported on 09/10/2021)  ? ?No facility-administered encounter medications on file as of 09/10/2021.  ? ? ? ?Objective:  ?  ? ?PHYSICAL EXAMINATION:   ? ?VITALS:   ?Vitals:  ? 09/10/21 0930  ?BP: 138/85  ?Pulse: (!) 58  ?Resp: 18  ?SpO2: 97%  ?Weight: 196 lb (88.9 kg)  ?Height: 5' 9.5" (1.765 m)  ? ? ? ?GEN:  The patient appears stated age and is in NAD. ?HEENT:  Normocephalic, atraumatic.  ? ?Neurological examination: ? ?General: NAD, well-groomed, appears stated age. ?Orientation: The patient is alert. Oriented to person, place and date and time. Unable to copy design, but able to draw a clock . Delayed recall 0/3  ?Cranial nerves: There is good facial symmetry.The speech is fluent and clear No aphasia or dysarthria. Fund of knowledge is appropriate. Recent and remote memory are intact. Attention and concentration are normal.  Able to name objects and repeat phrases.  Hearing is intact to conversational tone.    ?Sensation: Sensation is intact to light touch  throughout ?Motor: Strength is at least antigravity x4. ?Tremors: none  ?DTR's 1/4 in UE/LE  ? ?  ?Montreal Cognitive Assessment  04/07/2019 09/01/2018  ?Visuospatial/ Executive (0/5) - 3  ?Naming (0/3) - 2  ?Attention:

## 2021-09-10 NOTE — Patient Instructions (Signed)
Always good to see you!  ? ?1.Continue Galantamine '12mg'$  twice a day  ? ?2. Continue to monitor hand shaking ? ?3. Follow-up in 6 months, call for any changes. ? ? ?FALL PRECAUTIONS: Be cautious when walking. Scan the area for obstacles that may increase the risk of trips and falls. When getting up in the mornings, sit up at the edge of the bed for a few minutes before getting out of bed. Consider elevating the bed at the head end to avoid drop of blood pressure when getting up. Walk always in a well-lit room (use night lights in the walls). Avoid area rugs or power cords from appliances in the middle of the walkways. Use a walker or a cane if necessary and consider physical therapy for balance exercise. Get your eyesight checked regularly. ? ?HOME SAFETY: Consider the safety of the kitchen when operating appliances like stoves, microwave oven, and blender. Consider having supervision and share cooking responsibilities until no longer able to participate in those. Accidents with firearms and other hazards in the house should be identified and addressed as well. ? ?DRIVING: Regarding driving, in patients with progressive memory problems, driving will be impaired. We advise to have someone else do the driving if trouble finding directions or if minor accidents are reported. Independent driving assessment is available to determine safety of driving. ? ?ABILITY TO BE LEFT ALONE: If patient is unable to contact 911 operator, consider using LifeLine, or when the need is there, arrange for someone to stay with patients. Smoking is a fire hazard, consider supervision or cessation. Risk of wandering should be assessed by caregiver and if detected at any point, supervision and safe proof recommendations should be instituted. ? ?MEDICATION SUPERVISION: Inability to self-administer medication needs to be constantly addressed. Implement a mechanism to ensure safe administration of the medications. ? ?RECOMMENDATIONS FOR ALL  PATIENTS WITH MEMORY PROBLEMS: ?1. Continue to exercise (Recommend 30 minutes of walking everyday, or 3 hours every week) ?2. Increase social interactions - continue going to [place] and enjoy social gatherings with friends and family ?3. Eat healthy, avoid fried foods and eat more fruits and vegetables ?4. Maintain adequate blood pressure, blood sugar, and blood cholesterol level. Reducing the risk of stroke and cardiovascular disease also helps promoting better memory. ?5. Avoid stressful situations. Live a simple life and avoid aggravations. Organize your time and prepare for the next day in anticipation. ?6. Sleep well, avoid any interruptions of sleep and avoid any distractions in the bedroom that may interfere with adequate sleep quality ?7. Avoid sugar, avoid sweets as there is a strong link between excessive sugar intake, diabetes, and cognitive impairment ?The Mediterranean diet has been shown to help patients reduce the risk of progressive memory disorders and reduces cardiovascular risk. This includes eating fish, eat fruits and green leafy vegetables, nuts like almonds and hazelnuts, walnuts, and also use olive oil. Avoid fast foods and fried foods as much as possible. Avoid sweets and sugar as sugar use has been linked to worsening of memory function. ? ?There is always a concern of gradual progression of memory problems. If this is the case, then we may need to adjust level of care according to patient needs. Support, both to the patient and caregiver, should then be put into place. ? ?

## 2021-09-27 DIAGNOSIS — I10 Essential (primary) hypertension: Secondary | ICD-10-CM | POA: Diagnosis not present

## 2021-09-27 DIAGNOSIS — N400 Enlarged prostate without lower urinary tract symptoms: Secondary | ICD-10-CM | POA: Diagnosis not present

## 2021-09-30 ENCOUNTER — Telehealth: Payer: Self-pay | Admitting: Physician Assistant

## 2021-09-30 ENCOUNTER — Other Ambulatory Visit: Payer: Self-pay

## 2021-09-30 MED ORDER — GALANTAMINE HYDROBROMIDE 12 MG PO TABS: 12.0000 mg | ORAL_TABLET | Freq: Two times a day (BID) | ORAL | 3 refills | Status: DC

## 2021-09-30 NOTE — Telephone Encounter (Signed)
1. Which medications need refilled? (List name and dosage, if known) galantamine ? ?2. Which pharmacy/location is medication to be sent to? (include street and city if local pharmacy) [facility] order pharmacy ? ?It was sent to [facility] previously, but it would cost $200, so they need it sent to [facility] mail order pharmacy ? ? ?

## 2021-10-02 MED ORDER — GALANTAMINE HYDROBROMIDE 12 MG PO TABS: 12.0000 mg | ORAL_TABLET | Freq: Two times a day (BID) | ORAL | 3 refills | Status: DC

## 2021-10-02 NOTE — Telephone Encounter (Signed)
Medication sent into [facility] mail pharmacy  ?

## 2021-10-02 NOTE — Telephone Encounter (Signed)
1. Which medications need refilled? (List name and dosage, if known) galantamine ?  ?2. Which pharmacy/location is medication to be sent to? (include street and city if local pharmacy) [facility] order pharmacy ?  ?It was sent to [facility] previously, but it would cost $200, so they need it sent to [facility] mail order pharmacy ? ?Pts wife called and said they pharmacy told her they have not received a prescription for this. She would like for it to be sent again ?

## 2021-10-03 ENCOUNTER — Other Ambulatory Visit: Payer: Self-pay

## 2021-10-03 ENCOUNTER — Telehealth: Payer: Self-pay | Admitting: Physician Assistant

## 2021-10-03 MED ORDER — RIVASTIGMINE TARTRATE 1.5 MG PO CAPS: 1.5000 mg | ORAL_CAPSULE | Freq: Two times a day (BID) | ORAL | Status: DC

## 2021-10-03 NOTE — Telephone Encounter (Signed)
No answer at 9:37am will call back new Rx needs to be sent. ?

## 2021-10-03 NOTE — Telephone Encounter (Signed)
Sent to [facility] my chart message sent  ?

## 2021-10-03 NOTE — Telephone Encounter (Signed)
Insurance will cover Rivastigmine toatrate or donepezil. Please advise. Pharmacy is correct ?

## 2021-10-03 NOTE — Telephone Encounter (Signed)
The following message was left with [employer] on 10/02/21 at 9:14 PM. ? ?Caller states is calling regarding husbands medication for the Galantimine HBR 21 MG. Caller states needs to discuss alternative medications. Caller states he is out of the medication and insurance does not cover this medication.   ?  ?Patient has been completely out for a week. ? ?See detailed report in provider's inbox. ? ?

## 2021-10-10 ENCOUNTER — Telehealth: Payer: Self-pay | Admitting: Neurology

## 2021-10-10 ENCOUNTER — Telehealth: Payer: Self-pay | Admitting: Physician Assistant

## 2021-10-10 NOTE — Telephone Encounter (Signed)
Humberto Leep from [employer] PA dept. They need more information for a PA ?Two numbers to call ?[phone] or [phone] option 2 ?

## 2021-10-10 NOTE — Telephone Encounter (Signed)
[employer] called about a prior authorization for this patient. ?

## 2021-10-11 ENCOUNTER — Other Ambulatory Visit: Payer: Self-pay

## 2021-10-11 MED ORDER — RIVASTIGMINE TARTRATE 1.5 MG PO CAPS: 1.5000 mg | ORAL_CAPSULE | Freq: Two times a day (BID) | ORAL | 11 refills | Status: DC

## 2021-10-11 NOTE — Telephone Encounter (Signed)
Left message on voicemail to return call back.  ?

## 2021-10-24 ENCOUNTER — Other Ambulatory Visit (HOSPITAL_COMMUNITY): Payer: Self-pay | Admitting: Specialist

## 2021-10-24 ENCOUNTER — Other Ambulatory Visit: Payer: Self-pay | Admitting: Specialist

## 2021-10-24 DIAGNOSIS — M25562 Pain in left knee: Secondary | ICD-10-CM | POA: Diagnosis not present

## 2021-10-24 DIAGNOSIS — M25561 Pain in right knee: Secondary | ICD-10-CM

## 2021-11-06 ENCOUNTER — Encounter (HOSPITAL_COMMUNITY)
Admission: RE | Admit: 2021-11-06 | Discharge: 2021-11-06 | Disposition: A | Discharge status: Home / Self Care | Payer: PPO | Source: Ambulatory Visit | Attending: Specialist | Admitting: Specialist

## 2021-11-06 DIAGNOSIS — Z96653 Presence of artificial knee joint, bilateral: Secondary | ICD-10-CM | POA: Diagnosis not present

## 2021-11-06 DIAGNOSIS — Z471 Aftercare following joint replacement surgery: Secondary | ICD-10-CM | POA: Diagnosis not present

## 2021-11-06 DIAGNOSIS — M25561 Pain in right knee: Secondary | ICD-10-CM | POA: Insufficient documentation

## 2021-11-06 MED ORDER — TECHNETIUM TC 99M MEDRONATE IV KIT
22.0000 | PACK | Freq: Once | INTRAVENOUS | Status: AC
  Administered 2021-11-06: 22 via INTRAVENOUS

## 2021-11-27 DIAGNOSIS — M25561 Pain in right knee: Secondary | ICD-10-CM | POA: Diagnosis not present

## 2021-11-27 DIAGNOSIS — M25511 Pain in right shoulder: Secondary | ICD-10-CM | POA: Diagnosis not present

## 2021-12-05 DIAGNOSIS — R208 Other disturbances of skin sensation: Secondary | ICD-10-CM | POA: Diagnosis not present

## 2021-12-05 DIAGNOSIS — Z1331 Encounter for screening for depression: Secondary | ICD-10-CM | POA: Diagnosis not present

## 2021-12-05 DIAGNOSIS — I480 Paroxysmal atrial fibrillation: Secondary | ICD-10-CM | POA: Diagnosis not present

## 2021-12-05 DIAGNOSIS — Z0000 Encounter for general adult medical examination without abnormal findings: Secondary | ICD-10-CM | POA: Diagnosis not present

## 2021-12-05 DIAGNOSIS — I4892 Unspecified atrial flutter: Secondary | ICD-10-CM | POA: Diagnosis not present

## 2021-12-05 DIAGNOSIS — F0390 Unspecified dementia without behavioral disturbance: Secondary | ICD-10-CM | POA: Diagnosis not present

## 2021-12-05 DIAGNOSIS — M1000 Idiopathic gout, unspecified site: Secondary | ICD-10-CM | POA: Diagnosis not present

## 2021-12-05 DIAGNOSIS — N400 Enlarged prostate without lower urinary tract symptoms: Secondary | ICD-10-CM | POA: Diagnosis not present

## 2021-12-05 DIAGNOSIS — M5442 Lumbago with sciatica, left side: Secondary | ICD-10-CM | POA: Diagnosis not present

## 2021-12-05 DIAGNOSIS — G8929 Other chronic pain: Secondary | ICD-10-CM | POA: Diagnosis not present

## 2021-12-05 DIAGNOSIS — I10 Essential (primary) hypertension: Secondary | ICD-10-CM | POA: Diagnosis not present

## 2021-12-05 DIAGNOSIS — K219 Gastro-esophageal reflux disease without esophagitis: Secondary | ICD-10-CM | POA: Diagnosis not present

## 2021-12-26 DIAGNOSIS — G4733 Obstructive sleep apnea (adult) (pediatric): Secondary | ICD-10-CM | POA: Diagnosis not present

## 2022-02-14 DIAGNOSIS — G8929 Other chronic pain: Secondary | ICD-10-CM | POA: Diagnosis not present

## 2022-02-14 DIAGNOSIS — I10 Essential (primary) hypertension: Secondary | ICD-10-CM | POA: Diagnosis not present

## 2022-02-14 DIAGNOSIS — N400 Enlarged prostate without lower urinary tract symptoms: Secondary | ICD-10-CM | POA: Diagnosis not present

## 2022-02-14 DIAGNOSIS — I480 Paroxysmal atrial fibrillation: Secondary | ICD-10-CM | POA: Diagnosis not present

## 2022-02-14 DIAGNOSIS — K219 Gastro-esophageal reflux disease without esophagitis: Secondary | ICD-10-CM | POA: Diagnosis not present

## 2022-03-13 ENCOUNTER — Encounter: Payer: Self-pay | Admitting: Physician Assistant

## 2022-03-13 ENCOUNTER — Ambulatory Visit: Payer: PPO | Admitting: Physician Assistant

## 2022-03-13 VITALS — BP 130/62 | HR 53 | Resp 18 | Ht 69.0 in | Wt 186.0 lb

## 2022-03-13 DIAGNOSIS — F02A0 Dementia in other diseases classified elsewhere, mild, without behavioral disturbance, psychotic disturbance, mood disturbance, and anxiety: Secondary | ICD-10-CM | POA: Diagnosis not present

## 2022-03-13 DIAGNOSIS — G301 Alzheimer's disease with late onset: Secondary | ICD-10-CM

## 2022-03-13 NOTE — Progress Notes (Signed)
Assessment/Plan:   Mild dementia likely due to Alzheimer's Disease  Darrell Perez is a very pleasant 81 y.o. RH male   with a history of  hypertension, goiter s/p thyroid lobectomy, atrial fibrillation, iron deficiency anemia,  with mild dementia, likely due to Alzheimer's disease, seen today in follow up for memory loss. Patient is currently on galantamine 12 mg twice daily due to side effects from donepezil and memantine, tolerating well.  MMSE today is 28/30.  Delayed recall is 2/3 today.  He is stable from the cognitive standpoint.    Follow up in 6  months. Continue galantamine 12 mg bid (S.E  from donepezil and memantine)    Subjective:    This patient is accompanied in the office by wife who supplements the history.  Previous records as well as any outside records available were reviewed prior to todays visit.  Last seen at our office on 09/10/2021.  Last MMSE in September 2022 was 26/30   Any changes in memory since last visit?  "About the same. " Keeps a calendar at the table to try to keep up with the events.  He reads frequently, but is not participating in any outside activities, or doing crossword puzzles or word finding.  He enjoys working on the yard. Patient lives with: wife repeats oneself?  Endorsed, especially with appointments "when is the appointment?".  Denies repeating the same stories. Disoriented when walking into a room?  Patient denies   Leaving objects in unusual places?  Patient denies   Ambulates  with difficulty?   Patient denies   Recent falls?  He has a few mechanical falls due to issues with balance, he no uses his right cane.  Has chronic right knee pain followed by Ortho. Any head injuries?  Patient denies   History of seizures?   Patient denies   Wandering behavior?  Patient denies   Patient drives?   Patient no longer drives  Any mood changes since last visit?  Patient denies   Any worsening depression?:  Patient denies   Hallucinations?   Patient denies   Paranoia?  Patient denies   Patient reports that sleeps well without vivid dreams, REM behavior or sleepwalking .  Takes a few naps during the day. History of sleep apnea? Endorsed.  Uses a CPAP Any hygiene concerns?  Patient denies   Independent of bathing and dressing?  Endorsed  Does the patient needs help with medications?  Patient manages his medications, wife monitors closely. Who is in charge of the finances?  Wife is in charge    Any changes in appetite?  Patient denies   Patient have trouble swallowing? Patient denies   Does the patient cook?  Patient denies   Any kitchen accidents such as leaving the stove on? Patient denies   Any headaches?  Patient denies   Double vision? Patient denies   Any focal numbness or tingling?  Patient denies   Chronic back pain Patient denies   Unilateral weakness?  Patient denies   Any tremors? R hand intermittent tremor without changes    Any history of anosmia?  Patient denies   Any incontinence of urine?  History of urinary urgency Any bowel dysfunction?   Occasionally he has diarrhea, and when that happened "can't get to the bathroom soon enough "    History on Initial Assessment: This is a pleasant 81 year old right-handed man with a history of hypertension, goiter s/p thyroid lobectomy, atrial fibrillation, dementia, presenting to establish  care. He feels his memory is pretty good. His wife started noticing memory changes around 5 years ago where he was getting more forgetful, but worse in the past 1-2 years. He had sepsis in the Fall and things seemed to accelerate then. He had more difficulties initially when he got home, but has acclimated since then. His wife has taken over most of the driving after he made her nervous trying to turn left onto oncoming traffic 1.5 years ago and did not seem to comprehend this was difficult. He continues to manage his own medications. There have been a few bills that he has let go too long  without paying, which is unusual. He has word-finding difficulties. His wife denies any personality changes, no paranoia or hallucinations. He had abnormal dreams on Aricept and is taking Galantamine, however this is quite costly for their budget and his wife wonders if there is any true benefit from it. No side effects on galantamine.  He has low back pain and both legs feel weak. Otherwise he denies any headaches, dizziness, vision changes, dysarthria/dysphagia, neck pain, focal numbness/tingling, bowel/bladder dysfunction, anosmia, or tremors. Sleep is good. No falls. Both parents had dementia. He denies any history of significant head injuries or alcohol use.    I personally reviewed MRI brain with and without contrast done 10/2015 which did not show any acute changes. There was mild dilatation of the lateral ventricles with sparing of the temporal horns, mild diffuse atrophy and chronic microvascular disease.   Laboratory Data: TSH 03/2017 normal 1.13   PREVIOUS MEDICATIONS: memantine and donepezil  CURRENT MEDICATIONS:  Outpatient Encounter Medications as of 03/13/2022  Medication Sig   acetaminophen (TYLENOL) 500 MG tablet Take by mouth every 6 (six) hours as needed for mild pain, moderate pain, fever or headache.    allopurinol (ZYLOPRIM) 100 MG tablet Take 100 mg by mouth every morning.    cetirizine (ZYRTEC) 10 MG tablet 1 tablet   cholecalciferol (VITAMIN D) 1000 UNITS tablet Take 1,000 Units by mouth daily.   diltiazem (CARDIZEM CD) 240 MG 24 hr capsule Take 1 capsule (240 mg total) by mouth every morning. Patient needs appointment (Patient taking differently: Take 240 mg by mouth every morning. Patient needs appointment Pt takes BID)   galantamine (RAZADYNE) 12 MG tablet Take 12 mg by mouth 2 (two) times daily.   HYDROcodone-acetaminophen (NORCO/VICODIN) 5-325 MG tablet Take 1 tablet by mouth every 6 (six) hours as needed for moderate pain.   hyoscyamine (ANASPAZ) 0.125 MG TBDP  disintergrating tablet Place 0.125 mg under the tongue every 6 (six) hours as needed for cramping.   indapamide (LOZOL) 2.5 MG tablet Take 2.5 mg by mouth every evening.    loperamide (IMODIUM) 2 MG capsule Take 1-2 mg by mouth as needed.   pantoprazole (PROTONIX) 40 MG tablet Take 1 tablet by mouth daily.   polyethylene glycol (MIRALAX / GLYCOLAX) packet Take 17 g by mouth daily.   potassium chloride SA (K-DUR,KLOR-CON) 20 MEQ tablet Take 20 mEq by mouth 2 (two) times daily.   rivastigmine (EXELON) 1.5 MG capsule Take 1 capsule (1.5 mg total) by mouth 2 (two) times daily.   saccharomyces boulardii (FLORASTOR) 250 MG capsule 1 capsule   tolterodine (DETROL LA) 4 MG 24 hr capsule Take by mouth daily.   vitamin B-12 (CYANOCOBALAMIN) 1000 MCG tablet Take 1,000 mcg by mouth every morning.    No facility-administered encounter medications on file as of 03/13/2022.       03/14/2022  6:00 AM 03/12/2021   11:00 AM 08/10/2020    9:00 AM  MMSE - Mini Mental State Exam  Orientation to time '4 5 4  '$ Orientation to Place '5 5 5  '$ Registration '3 3 3  '$ Attention/ Calculation '5 5 4  '$ Recall 2 0 3  Language- name 2 objects '2 2 2  '$ Language- repeat '1 1 1  '$ Language- follow 3 step command '3 3 3  '$ Language- read & follow direction '1 1 1  '$ Write a sentence '1 1 1  '$ Copy design 1 0 1  Total score '28 26 28      '$ 04/07/2019    7:00 AM 09/01/2018    8:00 AM  Montreal Cognitive Assessment   Visuospatial/ Executive (0/5)  3  Naming (0/3)  2  Attention: Read list of digits (0/2) 2 1  Attention: Read list of letters (0/1) 1 1  Attention: Serial 7 subtraction starting at 100 (0/3) 2 2  Language: Repeat phrase (0/2) 1 0  Language : Fluency (0/1) 1 0  Abstraction (0/2) 2 2  Delayed Recall (0/5) 1 1  Orientation (0/6) 5 6  Total  18    Objective:     PHYSICAL EXAMINATION:    VITALS:   Vitals:   03/13/22 0848  BP: 130/62  Pulse: (!) 53  Resp: 18  SpO2: 98%  Weight: 186 lb (84.4 kg)  Height: '5\' 9"'$  (1.753  m)    GEN:  The patient appears stated age and is in NAD. HEENT:  Normocephalic, atraumatic.   Neurological examination:  General: NAD, well-groomed, appears stated age. Orientation: The patient is alert. Oriented to person, place and date Cranial nerves: There is good facial symmetry.The speech is fluent and clear. No aphasia or dysarthria. Fund of knowledge is appropriate. Recent and remote memory are impaired. Attention and concentration are normal.  Able to name objects and repeat phrases.  Hearing is intact to conversational tone.   Delayed recall is 2/3 Sensation: Sensation is intact to light touch throughout Motor: Strength is at least antigravity x4. Tremors: no tremors noted today DTR's 1/4 in UE/LE     Movement examination: Tone: There is normal tone in the UE/LE Abnormal movements:  no tremor.  No myoclonus.  No asterixis.   Coordination:  There is no decremation with RAM's. Normal finger to nose  Gait and Station: The patient has no difficulty arising out of a deep-seated chair without the use of the hands. The patient's stride length is good.  Gait is cautious and narrow, unsteady without a cane.    Thank you for allowing [place] the opportunity to participate in the care of this nice patient. Please do not hesitate to contact us for any questions or concerns.   Total time spent on today's visit was 30 minutes dedicated to this patient today, preparing to see patient, examining the patient, ordering tests and/or medications and counseling the patient, documenting clinical information in the EHR or other health record, independently interpreting results and communicating results to the patient/family, discussing treatment and goals, answering patient's questions and coordinating care.  Cc:  Lavone Orn, MD  Sharene Butters 03/14/2022 6:50 AM

## 2022-03-13 NOTE — Patient Instructions (Signed)
Always good to see you!   1.Continue Galantamine '12mg'$  twice a day   2. Continue to monitor hand shaking  Increase activity and brain games   3. Follow-up in 6 months, call for any changes.   FALL PRECAUTIONS: Be cautious when walking. Scan the area for obstacles that may increase the risk of trips and falls. When getting up in the mornings, sit up at the edge of the bed for a few minutes before getting out of bed. Consider elevating the bed at the head end to avoid drop of blood pressure when getting up. Walk always in a well-lit room (use night lights in the walls). Avoid area rugs or power cords from appliances in the middle of the walkways. Use a walker or a cane if necessary and consider physical therapy for balance exercise. Get your eyesight checked regularly.  HOME SAFETY: Consider the safety of the kitchen when operating appliances like stoves, microwave oven, and blender. Consider having supervision and share cooking responsibilities until no longer able to participate in those. Accidents with firearms and other hazards in the house should be identified and addressed as well.  DRIVING: Regarding driving, in patients with progressive memory problems, driving will be impaired. We advise to have someone else do the driving if trouble finding directions or if minor accidents are reported. Independent driving assessment is available to determine safety of driving.  ABILITY TO BE LEFT ALONE: If patient is unable to contact 911 operator, consider using LifeLine, or when the need is there, arrange for someone to stay with patients. Smoking is a fire hazard, consider supervision or cessation. Risk of wandering should be assessed by caregiver and if detected at any point, supervision and safe proof recommendations should be instituted.  MEDICATION SUPERVISION: Inability to self-administer medication needs to be constantly addressed. Implement a mechanism to ensure safe administration of the  medications.  RECOMMENDATIONS FOR ALL PATIENTS WITH MEMORY PROBLEMS: 1. Continue to exercise (Recommend 30 minutes of walking everyday, or 3 hours every week) 2. Increase social interactions - continue going to [employer] and enjoy social gatherings with friends and family 3. Eat healthy, avoid fried foods and eat more fruits and vegetables 4. Maintain adequate blood pressure, blood sugar, and blood cholesterol level. Reducing the risk of stroke and cardiovascular disease also helps promoting better memory. 5. Avoid stressful situations. Live a simple life and avoid aggravations. Organize your time and prepare for the next day in anticipation. 6. Sleep well, avoid any interruptions of sleep and avoid any distractions in the bedroom that may interfere with adequate sleep quality 7. Avoid sugar, avoid sweets as there is a strong link between excessive sugar intake, diabetes, and cognitive impairment The Mediterranean diet has been shown to help patients reduce the risk of progressive memory disorders and reduces cardiovascular risk. This includes eating fish, eat fruits and green leafy vegetables, nuts like almonds and hazelnuts, walnuts, and also use olive oil. Avoid fast foods and fried foods as much as possible. Avoid sweets and sugar as sugar use has been linked to worsening of memory function.  There is always a concern of gradual progression of memory problems. If this is the case, then we may need to adjust level of care according to patient needs. Support, both to the patient and caregiver, should then be put into place.

## 2022-03-14 DIAGNOSIS — F02A0 Dementia in other diseases classified elsewhere, mild, without behavioral disturbance, psychotic disturbance, mood disturbance, and anxiety: Secondary | ICD-10-CM | POA: Insufficient documentation

## 2022-03-21 DIAGNOSIS — E611 Iron deficiency: Secondary | ICD-10-CM | POA: Diagnosis not present

## 2022-04-11 DIAGNOSIS — N200 Calculus of kidney: Secondary | ICD-10-CM | POA: Diagnosis not present

## 2022-04-11 DIAGNOSIS — R351 Nocturia: Secondary | ICD-10-CM | POA: Diagnosis not present

## 2022-04-11 DIAGNOSIS — N401 Enlarged prostate with lower urinary tract symptoms: Secondary | ICD-10-CM | POA: Diagnosis not present

## 2022-04-14 DIAGNOSIS — L821 Other seborrheic keratosis: Secondary | ICD-10-CM | POA: Diagnosis not present

## 2022-04-14 DIAGNOSIS — D492 Neoplasm of unspecified behavior of bone, soft tissue, and skin: Secondary | ICD-10-CM | POA: Diagnosis not present

## 2022-04-14 DIAGNOSIS — L570 Actinic keratosis: Secondary | ICD-10-CM | POA: Diagnosis not present

## 2022-04-14 DIAGNOSIS — C44629 Squamous cell carcinoma of skin of left upper limb, including shoulder: Secondary | ICD-10-CM | POA: Diagnosis not present

## 2022-04-14 DIAGNOSIS — L814 Other melanin hyperpigmentation: Secondary | ICD-10-CM | POA: Diagnosis not present

## 2022-04-14 DIAGNOSIS — D225 Melanocytic nevi of trunk: Secondary | ICD-10-CM | POA: Diagnosis not present

## 2022-05-06 DIAGNOSIS — E611 Iron deficiency: Secondary | ICD-10-CM | POA: Diagnosis not present

## 2022-05-28 DIAGNOSIS — R2689 Other abnormalities of gait and mobility: Secondary | ICD-10-CM | POA: Diagnosis not present

## 2022-06-02 DIAGNOSIS — R2689 Other abnormalities of gait and mobility: Secondary | ICD-10-CM | POA: Diagnosis not present

## 2022-06-05 DIAGNOSIS — G8929 Other chronic pain: Secondary | ICD-10-CM | POA: Diagnosis not present

## 2022-06-05 DIAGNOSIS — E876 Hypokalemia: Secondary | ICD-10-CM | POA: Diagnosis not present

## 2022-06-05 DIAGNOSIS — L570 Actinic keratosis: Secondary | ICD-10-CM | POA: Diagnosis not present

## 2022-06-05 DIAGNOSIS — L821 Other seborrheic keratosis: Secondary | ICD-10-CM | POA: Diagnosis not present

## 2022-06-05 DIAGNOSIS — J309 Allergic rhinitis, unspecified: Secondary | ICD-10-CM | POA: Diagnosis not present

## 2022-06-05 DIAGNOSIS — I10 Essential (primary) hypertension: Secondary | ICD-10-CM | POA: Diagnosis not present

## 2022-06-05 DIAGNOSIS — F0390 Unspecified dementia without behavioral disturbance: Secondary | ICD-10-CM | POA: Diagnosis not present

## 2022-06-05 DIAGNOSIS — L814 Other melanin hyperpigmentation: Secondary | ICD-10-CM | POA: Diagnosis not present

## 2022-06-05 DIAGNOSIS — E559 Vitamin D deficiency, unspecified: Secondary | ICD-10-CM | POA: Diagnosis not present

## 2022-06-05 DIAGNOSIS — C44622 Squamous cell carcinoma of skin of right upper limb, including shoulder: Secondary | ICD-10-CM | POA: Diagnosis not present

## 2022-06-05 DIAGNOSIS — D044 Carcinoma in situ of skin of scalp and neck: Secondary | ICD-10-CM | POA: Diagnosis not present

## 2022-06-05 DIAGNOSIS — D225 Melanocytic nevi of trunk: Secondary | ICD-10-CM | POA: Diagnosis not present

## 2022-06-05 DIAGNOSIS — E538 Deficiency of other specified B group vitamins: Secondary | ICD-10-CM | POA: Diagnosis not present

## 2022-06-05 DIAGNOSIS — L298 Other pruritus: Secondary | ICD-10-CM | POA: Diagnosis not present

## 2022-06-05 DIAGNOSIS — E669 Obesity, unspecified: Secondary | ICD-10-CM | POA: Diagnosis not present

## 2022-06-05 DIAGNOSIS — G4733 Obstructive sleep apnea (adult) (pediatric): Secondary | ICD-10-CM | POA: Diagnosis not present

## 2022-06-05 DIAGNOSIS — G629 Polyneuropathy, unspecified: Secondary | ICD-10-CM | POA: Diagnosis not present

## 2022-06-05 DIAGNOSIS — D492 Neoplasm of unspecified behavior of bone, soft tissue, and skin: Secondary | ICD-10-CM | POA: Diagnosis not present

## 2022-06-05 DIAGNOSIS — H9193 Unspecified hearing loss, bilateral: Secondary | ICD-10-CM | POA: Diagnosis not present

## 2022-06-05 DIAGNOSIS — D509 Iron deficiency anemia, unspecified: Secondary | ICD-10-CM | POA: Diagnosis not present

## 2022-06-06 DIAGNOSIS — R2689 Other abnormalities of gait and mobility: Secondary | ICD-10-CM | POA: Diagnosis not present

## 2022-06-11 DIAGNOSIS — R2689 Other abnormalities of gait and mobility: Secondary | ICD-10-CM | POA: Diagnosis not present

## 2022-06-16 DIAGNOSIS — R2689 Other abnormalities of gait and mobility: Secondary | ICD-10-CM | POA: Diagnosis not present

## 2022-06-19 DIAGNOSIS — R2689 Other abnormalities of gait and mobility: Secondary | ICD-10-CM | POA: Diagnosis not present

## 2022-06-26 DIAGNOSIS — R2689 Other abnormalities of gait and mobility: Secondary | ICD-10-CM | POA: Diagnosis not present

## 2022-07-09 DIAGNOSIS — N401 Enlarged prostate with lower urinary tract symptoms: Secondary | ICD-10-CM | POA: Diagnosis not present

## 2022-07-09 DIAGNOSIS — N200 Calculus of kidney: Secondary | ICD-10-CM | POA: Diagnosis not present

## 2022-07-09 DIAGNOSIS — R351 Nocturia: Secondary | ICD-10-CM | POA: Diagnosis not present

## 2022-07-15 DIAGNOSIS — C44622 Squamous cell carcinoma of skin of right upper limb, including shoulder: Secondary | ICD-10-CM | POA: Diagnosis not present

## 2022-07-16 DIAGNOSIS — R3915 Urgency of urination: Secondary | ICD-10-CM | POA: Diagnosis not present

## 2022-07-16 DIAGNOSIS — N401 Enlarged prostate with lower urinary tract symptoms: Secondary | ICD-10-CM | POA: Diagnosis not present

## 2022-07-16 DIAGNOSIS — K219 Gastro-esophageal reflux disease without esophagitis: Secondary | ICD-10-CM | POA: Diagnosis not present

## 2022-07-16 DIAGNOSIS — R69 Illness, unspecified: Secondary | ICD-10-CM | POA: Diagnosis not present

## 2022-07-16 DIAGNOSIS — I10 Essential (primary) hypertension: Secondary | ICD-10-CM | POA: Diagnosis not present

## 2022-07-29 ENCOUNTER — Other Ambulatory Visit: Payer: Self-pay

## 2022-08-19 ENCOUNTER — Other Ambulatory Visit: Payer: Self-pay

## 2022-08-19 ENCOUNTER — Emergency Department (HOSPITAL_BASED_OUTPATIENT_CLINIC_OR_DEPARTMENT_OTHER)
Admission: EM | Admit: 2022-08-19 | Discharge: 2022-08-19 | Disposition: A | Discharge status: Home / Self Care | Payer: Medicare HMO | Attending: Emergency Medicine | Admitting: Emergency Medicine

## 2022-08-19 ENCOUNTER — Emergency Department (HOSPITAL_BASED_OUTPATIENT_CLINIC_OR_DEPARTMENT_OTHER): Payer: Medicare HMO

## 2022-08-19 DIAGNOSIS — R0789 Other chest pain: Secondary | ICD-10-CM | POA: Insufficient documentation

## 2022-08-19 DIAGNOSIS — Z79899 Other long term (current) drug therapy: Secondary | ICD-10-CM | POA: Insufficient documentation

## 2022-08-19 DIAGNOSIS — I10 Essential (primary) hypertension: Secondary | ICD-10-CM | POA: Insufficient documentation

## 2022-08-19 DIAGNOSIS — E876 Hypokalemia: Secondary | ICD-10-CM | POA: Diagnosis not present

## 2022-08-19 DIAGNOSIS — R9431 Abnormal electrocardiogram [ECG] [EKG]: Secondary | ICD-10-CM | POA: Diagnosis not present

## 2022-08-19 DIAGNOSIS — R079 Chest pain, unspecified: Secondary | ICD-10-CM | POA: Diagnosis not present

## 2022-08-19 LAB — BASIC METABOLIC PANEL
Anion gap: 7 (ref 5–15)
BUN: 21 mg/dL (ref 8–23)
CO2: 30 mmol/L (ref 22–32)
Calcium: 8.5 mg/dL — ABNORMAL LOW (ref 8.9–10.3)
Chloride: 100 mmol/L (ref 98–111)
Creatinine, Ser: 1.15 mg/dL (ref 0.61–1.24)
GFR, Estimated: >60 mL/min (ref >60)
Glucose, Bld: 83 mg/dL (ref 70–99)
Potassium: 3.4 mmol/L — ABNORMAL LOW (ref 3.5–5.1)
Sodium: 137 mmol/L (ref 135–145)

## 2022-08-19 LAB — CBC
HCT: 44.8 % (ref 39.0–52.0)
Hemoglobin: 15 g/dL (ref 13.0–17.0)
MCH: 29.6 pg (ref 26.0–34.0)
MCHC: 33.5 g/dL (ref 30.0–36.0)
MCV: 88.5 fL (ref 80.0–100.0)
Platelets: 271 10*3/uL (ref 150–400)
RBC: 5.06 MIL/uL (ref 4.22–5.81)
RDW: 15.3 % (ref 11.5–15.5)
WBC: 8.3 10*3/uL (ref 4.0–10.5)
nRBC: 0 % (ref 0.0–0.2)

## 2022-08-19 LAB — TROPONIN I (HIGH SENSITIVITY): Troponin I (High Sensitivity): 9 ng/L (ref <18)

## 2022-08-19 MED ORDER — POTASSIUM CHLORIDE CRYS ER 20 MEQ PO TBCR
20.0000 meq | EXTENDED_RELEASE_TABLET | Freq: Once | ORAL | Status: AC
  Administered 2022-08-19: 20 meq via ORAL
  Filled 2022-08-19: qty 1

## 2022-08-19 MED ORDER — VALACYCLOVIR HCL 1 G PO TABS: 1000.0000 mg | ORAL_TABLET | Freq: Three times a day (TID) | ORAL | 0 refills | Status: DC

## 2022-08-19 MED ORDER — VALACYCLOVIR HCL 500 MG PO TABS
1000.0000 mg | ORAL_TABLET | Freq: Once | ORAL | Status: AC
  Administered 2022-08-19: 1000 mg via ORAL
  Filled 2022-08-19: qty 2

## 2022-08-19 NOTE — ED Provider Notes (Signed)
[facility] Provider Note   CSN: [id number] Arrival date & time: 08/19/22  1723     History  Chief Complaint  Patient presents with   Chest Pain    Darrell Perez is a 82 y.o. male.   Chest Pain    82 year old male with medical history significant for HTN, IBS, OSA on CPAP, nephrolithiasis, paroxysmal atrial fibrillation on diltiazem not on anticoagulation who presents to the emergency department with left-sided chest wall pain.  The patient states that last night he developed "just the tingling" along his left chest wall.  He denied any symptoms of reflux.  He denied any chest pressure.  There is no radiation of the pain to his arm, jaw or back.  Since then, he has developed a discomfort along the chest wall radiating from his left anterior chest along to his left side.  He continues to endorse a tingling sensation along the left chest wall.  He is up-to-date on his Shingrix vaccine.  Home Medications Prior to Admission medications   Medication Sig Start Date End Date Taking? Authorizing Provider  acetaminophen (TYLENOL) 500 MG tablet Take by mouth every 6 (six) hours as needed for mild pain, moderate pain, fever or headache.     [provider]  allopurinol (ZYLOPRIM) 100 MG tablet Take 100 mg by mouth every morning.     [provider]  cetirizine (ZYRTEC) 10 MG tablet 1 tablet    [provider]  cholecalciferol (VITAMIN D) 1000 UNITS tablet Take 1,000 Units by mouth daily.    [provider]  diltiazem (CARDIZEM CD) 240 MG 24 hr capsule Take 1 capsule (240 mg total) by mouth every morning. Patient needs appointment Patient taking differently: Take 240 mg by mouth every morning. Patient needs appointment Pt takes BID 06/18/18   Minus Breeding, MD  galantamine (RAZADYNE) 12 MG tablet Take 12 mg by mouth 2 (two) times daily.    [provider]  HYDROcodone-acetaminophen (NORCO/VICODIN)  5-325 MG tablet Take 1 tablet by mouth every 6 (six) hours as needed for moderate pain.    [provider]  hyoscyamine (ANASPAZ) 0.125 MG TBDP disintergrating tablet Place 0.125 mg under the tongue every 6 (six) hours as needed for cramping.    [provider]  indapamide (LOZOL) 2.5 MG tablet Take 2.5 mg by mouth every evening.     [provider]  loperamide (IMODIUM) 2 MG capsule Take 1-2 mg by mouth as needed.    [provider]  pantoprazole (PROTONIX) 40 MG tablet Take 1 tablet by mouth daily. 07/24/17   [provider]  polyethylene glycol (MIRALAX / GLYCOLAX) packet Take 17 g by mouth daily. 03/27/17   Lavina Hamman, MD  potassium chloride SA (K-DUR,KLOR-CON) 20 MEQ tablet Take 20 mEq by mouth 2 (two) times daily.    [provider]  rivastigmine (EXELON) 1.5 MG capsule Take 1 capsule (1.5 mg total) by mouth 2 (two) times daily. 10/11/21   Rondel Jumbo, PA-C  saccharomyces boulardii (FLORASTOR) 250 MG capsule 1 capsule    [provider]  tolterodine (DETROL LA) 4 MG 24 hr capsule Take by mouth daily. 08/18/18   [provider]  vitamin B-12 (CYANOCOBALAMIN) 1000 MCG tablet Take 1,000 mcg by mouth every morning.     [provider]      Allergies    Donepezil hcl, Galantamine, Memantine hcl, Naprosyn [naproxen], and Sulfamethoxazole-trimethoprim    Review of Systems  Review of Systems  Cardiovascular:  Positive for chest pain.  All other systems reviewed and are negative.   Physical Exam Updated Vital Signs BP (!) 167/71   Pulse (!) 50   Temp (!) 97.4 F (36.3 C) (Oral)   Resp 15   Ht 5' 9"$  (1.753 m)   Wt 106.6 kg   SpO2 98%   BMI 34.70 kg/m  Physical Exam Vitals and nursing note reviewed.  Constitutional:      General: He is not in acute distress.    Appearance: He is well-developed.  HENT:     Head: Normocephalic and atraumatic.  Eyes:     Conjunctiva/sclera: Conjunctivae normal.   Cardiovascular:     Rate and Rhythm: Normal rate and regular rhythm.     Heart sounds: No murmur heard. Pulmonary:     Effort: Pulmonary effort is normal. No respiratory distress.     Breath sounds: Normal breath sounds.  Chest:     Comments: Left-sided chest wall tenderness to palpation, no rash Abdominal:     Palpations: Abdomen is soft.     Tenderness: There is no abdominal tenderness.  Musculoskeletal:        General: No swelling.     Cervical back: Neck supple.  Skin:    General: Skin is warm and dry.     Capillary Refill: Capillary refill takes less than 2 seconds.  Neurological:     Mental Status: He is alert.  Psychiatric:        Mood and Affect: Mood normal.     ED Results / Procedures / Treatments   Labs (all labs ordered are listed, but only abnormal results are displayed) Labs Reviewed  BASIC METABOLIC PANEL - Abnormal; Notable for the following components:      Result Value   Potassium 3.4 (*)    Calcium 8.5 (*)    All other components within normal limits  CBC  TROPONIN I (HIGH SENSITIVITY)    EKG EKG Interpretation  Date/Time:  Tuesday August 19 2022 17:38:33 EST Ventricular Rate:  61 PR Interval:    QRS Duration: 101 QT Interval:  456 QTC Calculation: 460 R Axis:   -30 Text Interpretation: Normal sinus rhythm Left ventricular hypertrophy Confirmed by Regan Lemming ([id number]) on 08/19/2022 6:31:10 PM  Radiology DG Chest 2 View  Result Date: 08/19/2022 CLINICAL DATA:  Chest pain EXAM: CHEST - 2 VIEW COMPARISON:  03/24/2017 FINDINGS: Right shoulder replacement. No acute airspace disease or pleural effusion. Stable cardiomediastinal silhouette. No pneumothorax. IMPRESSION: No active cardiopulmonary disease. Electronically Signed   By: Donavan Foil M.D.   On: 08/19/2022 18:02    Procedures Procedures    Medications Ordered in ED Medications  potassium chloride SA (KLOR-CON M) CR tablet 20 mEq (has no administration in time range)  valACYclovir  (VALTREX) tablet 1,000 mg (has no administration in time range)    ED Course/ Medical Decision Making/ A&P                             Medical Decision Making Amount and/or Complexity of Data Reviewed Labs: ordered. Radiology: ordered.  Risk Prescription drug management.    82 year old male with medical history significant for HTN, IBS, OSA on CPAP, nephrolithiasis, paroxysmal atrial fibrillation on diltiazem not on anticoagulation who presents to the emergency department with left-sided chest wall pain.  The patient states that last night he developed "just the tingling" along his left chest wall.  He  denied any symptoms of reflux.  He denied any chest pressure.  There is no radiation of the pain to his arm, jaw or back.  Since then, he has developed a discomfort along the chest wall radiating from his left anterior chest along to his left side.  He continues to endorse a tingling sensation along the left chest wall.  He is up-to-date on his Shingrix vaccine.  On arrival, the patient was vitally stable, afebrile, not tachycardic or tachypneic, saturating well on room air.  Physical exam significant for left-sided chest wall tenderness to palpation.  No shingles vesicular rash was present although the patient's symptoms appear to be consistent with early shingles versus musculoskeletal pain.    Initial EKG revealed sinus rhythm, ventricular rate 61, no acute ischemic changes, LVH present.  A chest x-ray revealed no active cardiopulmonary disease.  Laboratory evaluation revealed mild hypokalemia to 3.4, replenished orally.  No other acute abnormalities with initial negative troponin.  As the pain started yesterday, I do not think a delta troponin is indicated.  Low concern for ACS, PE, Boerhaave's, pneumothorax, pneumonia or any other acute intrathoracic abnormality.  The patient has been appropriately medically screened and/or stabilized in the ED. I have low suspicion for any other emergent  medical condition which would require further screening, evaluation or treatment in the ED or require inpatient management.  Will go ahead and treat with Valtrex for presumed early shingles infection.  Advised follow-up outpatient with his PCP.   Final Clinical Impression(s) / ED Diagnoses Final diagnoses:  Chest wall pain  Hypokalemia    Rx / DC Orders ED Discharge Orders     None         Regan Lemming, MD 08/19/22 1950

## 2022-08-19 NOTE — ED Triage Notes (Signed)
Pt arrives with c/o CP that started last night. Pt denies SOB. Pt has hx of a.fib.

## 2022-08-19 NOTE — ED Notes (Signed)
Discharge paperwork reviewed entirely with patient, including Rx's and follow up care. Pain was under control. Pt verbalized understanding as well as all parties involved. No questions or concerns voiced at the time of discharge. No acute distress noted.   Pt ambulated out to PVA without incident or assistance.

## 2022-08-19 NOTE — Discharge Instructions (Addendum)
The tingling sensation along your left chest wall and developing discomfort could be early shingles.  I will prescribe Valtrex to treat shingles.  Follow-up with your PCP to ensure resolution.  Return for any severe chest pressure, shortness of breath or any other concerns.  Your EKG, x-ray and laboratory evaluation was reassuring.

## 2022-08-20 DIAGNOSIS — N400 Enlarged prostate without lower urinary tract symptoms: Secondary | ICD-10-CM | POA: Diagnosis not present

## 2022-08-20 DIAGNOSIS — I480 Paroxysmal atrial fibrillation: Secondary | ICD-10-CM | POA: Diagnosis not present

## 2022-08-20 DIAGNOSIS — R0789 Other chest pain: Secondary | ICD-10-CM | POA: Diagnosis not present

## 2022-08-20 DIAGNOSIS — K219 Gastro-esophageal reflux disease without esophagitis: Secondary | ICD-10-CM | POA: Diagnosis not present

## 2022-08-20 DIAGNOSIS — I10 Essential (primary) hypertension: Secondary | ICD-10-CM | POA: Diagnosis not present

## 2022-08-20 DIAGNOSIS — R69 Illness, unspecified: Secondary | ICD-10-CM | POA: Diagnosis not present

## 2022-08-20 DIAGNOSIS — G8929 Other chronic pain: Secondary | ICD-10-CM | POA: Diagnosis not present

## 2022-08-21 DIAGNOSIS — R0789 Other chest pain: Secondary | ICD-10-CM | POA: Diagnosis not present

## 2022-08-21 DIAGNOSIS — I480 Paroxysmal atrial fibrillation: Secondary | ICD-10-CM | POA: Diagnosis not present

## 2022-08-21 DIAGNOSIS — I10 Essential (primary) hypertension: Secondary | ICD-10-CM | POA: Diagnosis not present

## 2022-08-21 DIAGNOSIS — R69 Illness, unspecified: Secondary | ICD-10-CM | POA: Diagnosis not present

## 2022-08-26 NOTE — Progress Notes (Unsigned)
Cardiology Office Note:   Date:  08/27/2022  NAME:  Darrell Perez    MRN: [id number] DOB:  Feb 17, 1941   PCP:  Lavone Orn, MD  Cardiologist:  None  Electrophysiologist:  None   Referring MD: Lavone Orn, MD   Chief Complaint  Patient presents with   Chest Pain    History of Present Illness:   Darrell Perez is a 82 y.o. male with a hx of AFL who is being seen today for the evaluation of chest pain at the request of Lavone Orn, MD. Seen in ER 08/19/2022 for CP. Negative work-up.  He presents with his wife.  Apparently for the past 1 to 2 weeks has had tingling in his left chest.  Symptoms occur when laying down.  They last several minutes.  They resolve without intervention.  He tells me it is not exertional.  Not alleviated by rest.  He has been seen by urgent care.  They told him he may have shingles.  There is no rash today.  He has never had a heart attack or stroke.  He had atrial flutter in the past.  No recurrence.  This was in the setting of sepsis.  He reports he is having some tingling now.  EKG does demonstrate sinus bradycardia heart rate 55, single PVC noted.  He reports no palpitations.  He had an echocardiogram in 2018 that was unremarkable.  He has never had a stress test.  Recent kidney profile is within limits.  Blood pressure is controlled.  Denies any significant shortness of breath.  No lower extremity edema.  He is not that active.  He does have dementia.  Problem List Atrial flutter  -2/2 sepsis 02/2017 -> no recurrence  2. HTN 3. HLD -T chol 172, HDL 37, LDL 99, TG 204  Past Medical History: Past Medical History:  Diagnosis Date   Arthritis    Dementia ([place])    GERD (gastroesophageal reflux disease)    watches and NO ISSUES WHEN USING CPAP--  NO MEDS   Hiatal hernia    History of asthma    History of kidney stones    History of multinodular goiter    s/p  thyroid lobectomy 1967   Hypertension    Irritable bowel syndrome    Left knee DJD    Left  ureteral calculus    Nephrolithiasis    bilateral non-obstrucive per CT 03-23-2017   OSA on CPAP    cpap does not know settings    Paroxysmal atrial fibrillation ([place]) newly dx 03-25-2017 (during hospitlization for urosepsis)   A-fib -- Aflutter w/ RVR -- pt started on cardizem and eliquis--- at discharge in [facility]/  cardiologist-  dr hochrein    Past Surgical History: Past Surgical History:  Procedure Laterality Date   CARPAL TUNNEL RELEASE Bilateral [place], BILATERAL  2005  &  2012   CYSTOSCOPY WITH STENT PLACEMENT Left 03/23/2017   Procedure: CYSTOSCOPY, LEFT RETROGRADE WITH [place];  Surgeon: Lucas Mallow, MD;  Location: [facility];  Service: Urology;  Laterality: Left;   CYSTOSCOPY/URETEROSCOPY/HOLMIUM LASER/STENT PLACEMENT Left 04/10/2017   Procedure: CYSTOSCOPY/URETEROSCOPY/HOLMIUM LASER/STENT PLACEMENT;  Surgeon: Festus Aloe, MD;  Location: [facility];  Service: Urology;  Laterality: Left;   EXTRACORPOREAL SHOCK WAVE LITHOTRIPSY     MULTIPLE   HEMIARTHROPLASTY RIGHT SHOULDER  2007   HOLMIUM LASER APPLICATION Left [id number]   Procedure: HOLMIUM LASER APPLICATION;  Surgeon: Fredricka Bonine, MD;  Location: [facility];  Service: Urology;  Laterality: Left;   [place]   removal of stones. (OPEN PROCUDURE)   REVERSE SHOULDER ARTHROPLASTY  10/09/2011   Procedure: REVERSE SHOULDER ARTHROPLASTY;  Surgeon: Marin Shutter, MD;  Location: [facility];  Service: Orthopedics;  Laterality: Right;  right hardware removal and reverse shoulder arthroplasty   THYROID LOBECTOMY  1967   goiter removed   TONSILLECTOMY     TOTAL KNEE ARTHROPLASTY  07/22/2012   Procedure: TOTAL KNEE ARTHROPLASTY;  Surgeon: Johnn Hai, MD;  Location: [facility];  Service: Orthopedics;  Laterality: Right;   TOTAL KNEE ARTHROPLASTY Left 12/16/2012   Procedure: LEFT TOTAL KNEE  ARTHROPLASTY;  Surgeon: Johnn Hai, MD;  Location: [facility];  Service: Orthopedics;  Laterality: Left;   TRANSTHORACIC ECHOCARDIOGRAM  03/25/2017   mild LVH, ef 55-60%/  mild LAE/ trivial PR/  mild MR   TRIGGER FINGER RELEASE Right 2006   URETEROSOPIC STONE EXTRACTION  2002    Current Medications: Current Meds  Medication Sig   acetaminophen (TYLENOL) 500 MG tablet Take by mouth every 6 (six) hours as needed for mild pain, moderate pain, fever or headache.    allopurinol (ZYLOPRIM) 100 MG tablet Take 100 mg by mouth every morning.    cetirizine (ZYRTEC) 10 MG tablet 1 tablet   cholecalciferol (VITAMIN D) 1000 UNITS tablet Take 1,000 Units by mouth daily.   diltiazem (CARDIZEM CD) 240 MG 24 hr capsule Take 1 capsule (240 mg total) by mouth every morning. Patient needs appointment (Patient taking differently: Take 240 mg by mouth every morning. Patient needs appointment Pt takes BID)   HYDROcodone-acetaminophen (NORCO/VICODIN) 5-325 MG tablet Take 1 tablet by mouth every 6 (six) hours as needed for moderate pain.   hyoscyamine (ANASPAZ) 0.125 MG TBDP disintergrating tablet Place 0.125 mg under the tongue every 6 (six) hours as needed for cramping.   indapamide (LOZOL) 2.5 MG tablet Take 2.5 mg by mouth every evening.    loperamide (IMODIUM) 2 MG capsule Take 1-2 mg by mouth as needed.   pantoprazole (PROTONIX) 40 MG tablet Take 1 tablet by mouth daily.   potassium chloride SA (K-DUR,KLOR-CON) 20 MEQ tablet Take 20 mEq by mouth 2 (two) times daily.   rivastigmine (EXELON) 1.5 MG capsule Take 1 capsule (1.5 mg total) by mouth 2 (two) times daily.   saccharomyces boulardii (FLORASTOR) 250 MG capsule 1 capsule   tolterodine (DETROL LA) 4 MG 24 hr capsule Take by mouth daily.   valACYclovir (VALTREX) 1000 MG tablet Take 1 tablet (1,000 mg total) by mouth 3 (three) times daily.   vitamin B-12 (CYANOCOBALAMIN) 1000 MCG tablet Take 1,000 mcg by mouth every morning.      Allergies:    Donepezil  hcl, Galantamine, Memantine hcl, Naprosyn [naproxen], and Sulfamethoxazole-trimethoprim   Social History: Social History   Socioeconomic History   Marital status: Married    Spouse name: Not on file   Number of children: 2   Years of education: Not on file   Highest education level: Not on file  Occupational History   Occupation: Retired - Press photographer  Tobacco Use   Smoking status: Never   Smokeless tobacco: Never  Vaping Use   Vaping Use: Never used  Substance and Sexual Activity   Alcohol use: No   Drug use: No   Sexual activity: Not Currently    Partners: Female  Other Topics Concern   Not  on file  Social History Narrative   Pt lives in 1 story home with his wife   Has 2 adult children   4 year degree   Retired Optometrist    Right handed   Social Determinants of Health   Financial Resource Strain: Not on file  Food Insecurity: Not on file  Transportation Needs: Not on file  Physical Activity: Not on file  Stress: Not on file  Social Connections: Not on file     Family History: The patient's family history includes Dementia in his father and mother. There is no history of Anesthesia problems, Hypotension, Malignant hyperthermia, or Pseudochol deficiency.  ROS:   All other ROS reviewed and negative. Pertinent positives noted in the HPI.     EKGs/Labs/Other Studies Reviewed:   The following studies were personally reviewed by me today:  EKG:  EKG is ordered today.  The ekg ordered today demonstrates normal sinus rhythm heart rate 55, sinus bradycardia, LVH by voltage, and was personally reviewed by me.   [place] 04/30/2017 - Left ventricle: The cavity size was normal. Wall thickness was    increased in a pattern of mild LVH. Systolic function was normal.    The estimated ejection fraction was in the range of 55% to 60%.    Wall motion was normal; there were no regional wall motion    abnormalities. Left ventricular diastolic function parameters    were normal.  -  Left atrium: LA diameter 43 mm. The atrium was mildly dilated.  - Atrial septum: No defect or patent foramen ovale was identified.   TTE 02/2017 - Left ventricle: The cavity size was normal. Wall thickness was    increased in a pattern of mild LVH. Systolic function was normal.    The estimated ejection fraction was in the range of 55% to 60%.    Wall motion was normal; there were no regional wall motion    abnormalities. Left ventricular diastolic function parameters    were normal.  - Left atrium: LA diameter 43 mm. The atrium was mildly dilated.  - Atrial septum: No defect or patent foramen ovale was identified.   Recent Labs: 08/19/2022: BUN 21; Creatinine, Ser 1.15; Hemoglobin 15.0; Platelets 271; Potassium 3.4; Sodium 137   Recent Lipid Panel No results found for: "CHOL", "TRIG", "HDL", "CHOLHDL", "VLDL", "LDLCALC", "LDLDIRECT"  Physical Exam:   VS:  BP (!) 142/62   Pulse 60   Ht '5\' 9"'$  (1.753 m)   Wt 189 lb (85.7 kg)   SpO2 97%   BMI 27.91 kg/m    Wt Readings from Last 3 Encounters:  08/27/22 189 lb (85.7 kg)  08/19/22 235 lb (106.6 kg)  03/13/22 186 lb (84.4 kg)    General: Well nourished, well developed, in no acute distress Head: Atraumatic, normal size  Eyes: PEERLA, EOMI  Neck: Supple, no JVD Endocrine: No thryomegaly Cardiac: Normal S1, S2; RRR; no murmurs, rubs, or gallops Lungs: Clear to auscultation bilaterally, no wheezing, rhonchi or rales  Abd: Soft, nontender, no hepatomegaly  Ext: No edema, pulses 2+ Musculoskeletal: No deformities, BUE and BLE strength normal and equal Skin: Warm and dry, no rashes   Neuro: Alert and oriented to person, place, time, and situation, CNII-XII grossly intact, no focal deficits  Psych: Normal mood and affect   ASSESSMENT:   Darrell Perez is a 82 y.o. male who presents for the following: 1. Precordial pain   2. Typical atrial flutter ([place])   3. Primary hypertension  PLAN:   1. Precordial pain -Suspect noncardiac  chest pain.  Suspect symptoms are acid reflux related.  Describes left tingling in his left chest at night with laying down.  Not exertional.  Not alleviated by rest.  His EKG demonstrates sinus bradycardia with a single PVC.  No palpitations reported.  We will repeat an echocardiogram.  He had testing in 2018 that was unremarkable.  We will also set him up for a Lexiscan nuclear medicine stress test to exclude CAD.  If these are negative would recommend intensification of acid reflux medication.  2. Typical atrial flutter ([facility]) -No recurrence.  Occurred in 2018 in the setting of sepsis.  Not on anticoagulation.  3. Primary hypertension -Continue current medications.  Shared Decision Making/Informed Consent The risks [chest pain, shortness of breath, cardiac arrhythmias, dizziness, blood pressure fluctuations, myocardial infarction, stroke/transient ischemic attack, nausea, vomiting, allergic reaction, radiation exposure, metallic taste sensation and life-threatening complications (estimated to be 1 in 10,000)], benefits (risk stratification, diagnosing coronary artery disease, treatment guidance) and alternatives of a nuclear stress test were discussed in detail with Mr. Isaak and he agrees to proceed.   Disposition: Return in about 1 year (around 08/28/2023).  Medication Adjustments/Labs and Tests Ordered: Current medicines are reviewed at length with the patient today.  Concerns regarding medicines are outlined above.  Orders Placed This Encounter  Procedures   MYOCARDIAL PERFUSION IMAGING   EKG 12-Lead   ECHOCARDIOGRAM COMPLETE   No orders of the defined types were placed in this encounter.   Patient Instructions  Medication Instructions:  The current medical regimen is effective;  continue present plan and medications.  *If you need a refill on your cardiac medications before your next appointment, please call your pharmacy*   Testing/Procedures:  Echocardiogram - Your  physician has requested that you have an echocardiogram. Echocardiography is a painless test that uses sound waves to create images of your heart. It provides your doctor with information about the size and shape of your heart and how well your heart's chambers and valves are working. This procedure takes approximately one hour. There are no restrictions for this procedure.   Your physician has requested that you have a lexiscan myoview. For further information please visit [email or web address]. Please follow instruction sheet, as given.    Follow-Up: At [facility], you and your health needs are our priority.  As part of our continuing mission to provide you with exceptional heart care, we have created designated Provider Care Teams.  These Care Teams include your primary Cardiologist (physician) and Advanced Practice Providers (APPs -  Physician Assistants and Nurse Practitioners) who all work together to provide you with the care you need, when you need it.  We recommend signing up for the patient portal called "MyChart".  Sign up information is provided on this After Visit Summary.  MyChart is used to connect with patients for Virtual Visits (Telemedicine).  Patients are able to view lab/test results, encounter notes, upcoming appointments, etc.  Non-urgent messages can be sent to your provider as well.   To learn more about what you can do with MyChart, go to [email or web address].    Your next appointment:   12 month(s)  Provider:   Eleonore Chiquito, MD    Signed, Addison Naegeli. Audie Box, MD, [facility]  [address], [place], [place] [address] [phone]  08/27/2022 1:56 PM

## 2022-08-27 ENCOUNTER — Encounter: Payer: Self-pay | Admitting: Cardiovascular Disease

## 2022-08-27 ENCOUNTER — Ambulatory Visit: Payer: Medicare HMO | Attending: Cardiovascular Disease | Admitting: Cardiovascular Disease

## 2022-08-27 VITALS — BP 142/62 | HR 60 | Ht 69.0 in | Wt 189.0 lb

## 2022-08-27 DIAGNOSIS — R072 Precordial pain: Secondary | ICD-10-CM | POA: Diagnosis not present

## 2022-08-27 DIAGNOSIS — I483 Typical atrial flutter: Secondary | ICD-10-CM | POA: Diagnosis not present

## 2022-08-27 DIAGNOSIS — I10 Essential (primary) hypertension: Secondary | ICD-10-CM

## 2022-08-27 NOTE — Patient Instructions (Signed)
Medication Instructions:  The current medical regimen is effective;  continue present plan and medications.  *If you need a refill on your cardiac medications before your next appointment, please call your pharmacy*   Testing/Procedures:  Echocardiogram - Your physician has requested that you have an echocardiogram. Echocardiography is a painless test that uses sound waves to create images of your heart. It provides your doctor with information about the size and shape of your heart and how well your heart's chambers and valves are working. This procedure takes approximately one hour. There are no restrictions for this procedure.   Your physician has requested that you have a lexiscan myoview. For further information please visit [email or web address]. Please follow instruction sheet, as given.    Follow-Up: At [facility], you and your health needs are our priority.  As part of our continuing mission to provide you with exceptional heart care, we have created designated Provider Care Teams.  These Care Teams include your primary Cardiologist (physician) and Advanced Practice Providers (APPs -  Physician Assistants and Nurse Practitioners) who all work together to provide you with the care you need, when you need it.  We recommend signing up for the patient portal called "MyChart".  Sign up information is provided on this After Visit Summary.  MyChart is used to connect with patients for Virtual Visits (Telemedicine).  Patients are able to view lab/test results, encounter notes, upcoming appointments, etc.  Non-urgent messages can be sent to your provider as well.   To learn more about what you can do with MyChart, go to [email or web address].    Your next appointment:   12 month(s)  Provider:   Eleonore Chiquito, MD

## 2022-08-29 ENCOUNTER — Telehealth (HOSPITAL_COMMUNITY): Payer: Self-pay | Admitting: *Deleted

## 2022-08-29 NOTE — Telephone Encounter (Signed)
Patient's wife given detailed instructions per Myocardial Perfusion Study Information Sheet for the test on 09/01/2022 at 10:15. Patient notified to arrive 15 minutes early and that it is imperative to arrive on time for appointment to keep from having the test rescheduled.  If you need to cancel or reschedule your appointment, please call the office within 24 hours of your appointment. . Patient's wife verbalized understanding.Veronia Beets

## 2022-09-01 ENCOUNTER — Encounter (HOSPITAL_COMMUNITY): Payer: Self-pay | Admitting: *Deleted

## 2022-09-01 ENCOUNTER — Ambulatory Visit (HOSPITAL_COMMUNITY): Payer: Medicare HMO | Attending: Cardiology

## 2022-09-01 DIAGNOSIS — R072 Precordial pain: Secondary | ICD-10-CM | POA: Diagnosis not present

## 2022-09-01 MED ORDER — TECHNETIUM TC 99M TETROFOSMIN IV KIT
10.9000 | PACK | Freq: Once | INTRAVENOUS | Status: AC | PRN
  Administered 2022-09-01: 10.9 via INTRAVENOUS

## 2022-09-02 ENCOUNTER — Ambulatory Visit (HOSPITAL_COMMUNITY): Payer: Medicare HMO | Attending: Cardiology

## 2022-09-02 LAB — MYOCARDIAL PERFUSION IMAGING
Estimated workload: 1
Exercise duration (min): 1 min
Exercise duration (sec): 0 s
LV dias vol: 80 mL (ref 62–150)
LV sys vol: 31 mL
MPHR: 139 {beats}/min
Nuc Stress EF: 62 %
Peak HR: 80 {beats}/min
Percent HR: 57 %
Rest HR: 48 {beats}/min
Rest Nuclear Isotope Dose: 10.9 mCi
SDS: 4
SRS: 0
SSS: 4
ST Depression (mm): 0 mm
Stress Nuclear Isotope Dose: 32.5 mCi
TID: 1.2

## 2022-09-10 NOTE — Progress Notes (Signed)
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 RH male  with a history of  hypertension, goiter s/p thyroid lobectomy, atrial fibrillation, iron deficiency anemia, with mild dementia, likely due to Alzheimer's disease, seen today in follow up for memory loss. Patient is currently on galantamine 12 mg twice daily due to side effects from donepezil and memantine seen today in follow up for memory loss. Todays's MMSE is 25/30, slight decline from prior visit. He had some balance difficulties at risk for fall agreeing to do physical therapy.     Follow up in  6 months. Continue galantamine 12 mg bid, side effects discussed Recommend good control of cardiovascular risk factors.   Start Lexapro 5 mg nightly for mood, side effects discussed Referral to PT for strength and balance.    Subjective:    This patient is accompanied in the office by his wife who supplements the history.  Previous records as well as any outside records available were reviewed prior to todays visit. Patient was last seen on 03/12/22 at which time his MMSE was 28/30     Any changes in memory since last visit? "May be worse"-according to wife. Patient has some difficulty remembering recent conversations and people names . Keeps a calendar to help him remember events, He likes to read.  repeats oneself?  Endorsed, especially with appointments Disoriented when walking into a room?  Patient denies except occasionally not remembering what patient came to the room for    Leaving objects in unusual places? " If I leave stuff in my room I can find it, otherwise I can't"  Wandering behavior?  denies   Any personality changes since last visit?  denies   Any depression?:  "A little more withdrawn due to memory changes"-wife says Hallucinations or paranoia? Denies  Seizures?    denies    Any sleep changes?  Sleeps well, Denies vivid dreams, REM behavior  or sleepwalking   Sleep apnea?  Yes, Uses a CPAP but not as frequently Any hygiene concerns?  denies   Independent of bathing and dressing?  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 Does the patient needs help with medications? Wife is in charge   Who is in charge of the finances?  Wife is in charge     Any changes in appetite?  denies     Patient have trouble swallowing?  denies   Does the patient cook?  No  Any headaches?   denies   Chronic back pain  denies   Ambulates with difficulty?  "Very slow"  Recent falls or head injuries? Fell in the yard and yesterday in the yard, mechanical no LOC, no head injury . He reports that he is concerned about doing PT because in the past "they injured my R knee replacement "  Unilateral weakness, numbness or tingling?    denies   Any tremors? Chronic R hand tremor . He reports that "is better" Any anosmia?  Patient denies   Any incontinence of urine He has urine urgency followed by urology Any bowel dysfunction?     denies      Patient lives  with his wife  Does the patient drive? Occasionally, denies getting lost, only short distances      History on Initial Assessment: This is a pleasant 82 year old right-handed man with a history of hypertension, goiter s/p  thyroid lobectomy, atrial fibrillation, dementia, presenting to establish care. He feels his memory is pretty good. His wife started noticing memory changes around 5 years ago where he was getting more forgetful, but worse in the past 1-2 years. He had sepsis in the Fall and things seemed to accelerate then. He had more difficulties initially when he got home, but has acclimated since then. His wife has taken over most of the driving after he made her nervous trying to turn left onto oncoming traffic 1.5 years ago and did not seem to comprehend this was difficult. He continues to manage his own medications. There have been a few bills that he has let go too  long without paying, which is unusual. He has word-finding difficulties. His wife denies any personality changes, no paranoia or hallucinations. He had abnormal dreams on Aricept and is taking Galantamine, however this is quite costly for their budget and his wife wonders if there is any true benefit from it. No side effects on galantamine.  He has low back pain and both legs feel weak. Otherwise he denies any headaches, dizziness, vision changes, dysarthria/dysphagia, neck pain, focal numbness/tingling, bowel/bladder dysfunction, anosmia, or tremors. Sleep is good. No falls. Both parents had dementia. He denies any history of significant head injuries or alcohol use.    I personally reviewed MRI brain with and without contrast done 10/2015 which did not show any acute changes. There was mild dilatation of the lateral ventricles with sparing of the temporal horns, mild diffuse atrophy and chronic microvascular disease.   Laboratory Data: TSH 03/2017 normal 1.13   PREVIOUS MEDICATIONS: memantine and donepezil   Outpatient Encounter Medications as of 09/11/2022  Medication Sig   acetaminophen (TYLENOL) 500 MG tablet Take by mouth every 6 (six) hours as needed for mild pain, moderate pain, fever or headache.    allopurinol (ZYLOPRIM) 100 MG tablet Take 100 mg by mouth every morning.    cetirizine (ZYRTEC) 10 MG tablet 1 tablet   cholecalciferol (VITAMIN D) 1000 UNITS tablet Take 1,000 Units by mouth daily.   diltiazem (CARDIZEM CD) 240 MG 24 hr capsule Take 1 capsule (240 mg total) by mouth every morning. Patient needs appointment (Patient taking differently: Take 240 mg by mouth every morning. Patient needs appointment Pt takes BID)   escitalopram (LEXAPRO) 5 MG tablet Take 1 tablet (5 mg total) by mouth at bedtime.   HYDROcodone-acetaminophen (NORCO/VICODIN) 5-325 MG tablet Take 1 tablet by mouth every 6 (six) hours as needed for moderate pain.   hyoscyamine (ANASPAZ) 0.125 MG TBDP disintergrating  tablet Place 0.125 mg under the tongue every 6 (six) hours as needed for cramping.   indapamide (LOZOL) 2.5 MG tablet Take 2.5 mg by mouth every evening.    loperamide (IMODIUM) 2 MG capsule Take 1-2 mg by mouth as needed.   pantoprazole (PROTONIX) 40 MG tablet Take 1 tablet by mouth daily.   polyethylene glycol (MIRALAX / GLYCOLAX) packet Take 17 g by mouth daily.   potassium chloride SA (K-DUR,KLOR-CON) 20 MEQ tablet Take 20 mEq by mouth 2 (two) times daily.   saccharomyces boulardii (FLORASTOR) 250 MG capsule 1 capsule   tolterodine (DETROL LA) 4 MG 24 hr capsule Take by mouth daily.   valACYclovir (VALTREX) 1000 MG tablet Take 1 tablet (1,000 mg total) by mouth 3 (three) times daily.   vitamin B-12 (CYANOCOBALAMIN) 1000 MCG tablet Take 1,000 mcg by mouth every morning.    [DISCONTINUED] galantamine (RAZADYNE) 12 MG tablet Take 12 mg by mouth  2 (two) times daily.   [DISCONTINUED] rivastigmine (EXELON) 1.5 MG capsule Take 1 capsule (1.5 mg total) by mouth 2 (two) times daily.   galantamine (RAZADYNE) 12 MG tablet Take 1 tablet (12 mg total) by mouth 2 (two) times daily.   No facility-administered encounter medications on file as of 09/11/2022.       09/11/2022    5:00 PM 03/14/2022    6:00 AM 03/12/2021   11:00 AM  MMSE - Mini Mental State Exam  Orientation to time '3 4 5  '$ Orientation to Place '5 5 5  '$ Registration '3 3 3  '$ Attention/ Calculation '4 5 5  '$ Recall 1 2 0  Language- name 2 objects '2 2 2  '$ Language- repeat '1 1 1  '$ Language- follow 3 step command '3 3 3  '$ Language- read & follow direction '1 1 1  '$ Write a sentence '1 1 1  '$ Copy design 1 1 0  Total score '25 28 26      '$ 04/07/2019    7:00 AM 09/01/2018    8:00 AM  Montreal Cognitive Assessment   Visuospatial/ Executive (0/5)  3  Naming (0/3)  2  Attention: Read list of digits (0/2) 2 1  Attention: Read list of letters (0/1) 1 1  Attention: Serial 7 subtraction starting at 100 (0/3) 2 2  Language: Repeat phrase (0/2) 1 0  Language :  Fluency (0/1) 1 0  Abstraction (0/2) 2 2  Delayed Recall (0/5) 1 1  Orientation (0/6) 5 6  Total  18    Objective:     PHYSICAL EXAMINATION:    VITALS:   Vitals:   09/11/22 0921  BP: 120/80  Pulse: 74  Resp: 18  SpO2: 98%  Weight: 192 lb (87.1 kg)  Height: '5\' 9"'$  (1.753 m)    GEN:  The patient appears stated age and is in NAD. HEENT:  Normocephalic, atraumatic.   Neurological examination:  General: NAD, well-groomed, appears stated age. Orientation: The patient is alert. Oriented to person, place and not to date Cranial nerves: There is good facial symmetry.  Speech is fluent and clear. No aphasia or dysarthria. Fund of knowledge is appropriate. Recent and remote memory are impaired. Attention and concentration are reduced.  Able to name objects and repeat phrases.  Hearing is decreased  to conversational tone.   Sensation: Sensation is intact to light touch throughout Motor: Strength is at least antigravity x4. DTR's 1/4 in UE/LE     Movement examination: Tone: There is normal tone in the UE/LE Abnormal movements:  no tremors  are noted today.  No myoclonus.  No asterixis.   Coordination:  There is no decremation with RAM's. Normal finger to nose  Gait and Station: The patient has no difficulty arising out of a deep-seated chair without the use of the hands. The patient's stride length is good.  Gait is cautious and narrow, but unsteady without a cane.    Thank you for allowing [place] the opportunity to participate in the care of this nice patient. Please do not hesitate to contact us for any questions or concerns.   Total time spent on today's visit was 20 minutes dedicated to this patient today, preparing to see patient, examining the patient, ordering tests and/or medications and counseling the patient, documenting clinical information in the EHR or other health record, independently interpreting results and communicating results to the patient/family, discussing treatment  and goals, answering patient's questions and coordinating care.  Cc:  Corliss Blacker, MD  Sharene Butters 09/11/2022 5:25 PM

## 2022-09-11 ENCOUNTER — Encounter: Payer: Self-pay | Admitting: Physician Assistant

## 2022-09-11 ENCOUNTER — Ambulatory Visit: Payer: Medicare HMO | Admitting: Physician Assistant

## 2022-09-11 VITALS — BP 120/80 | HR 74 | Resp 18 | Ht 69.0 in | Wt 192.0 lb

## 2022-09-11 DIAGNOSIS — F02A0 Dementia in other diseases classified elsewhere, mild, without behavioral disturbance, psychotic disturbance, mood disturbance, and anxiety: Secondary | ICD-10-CM

## 2022-09-11 DIAGNOSIS — R251 Tremor, unspecified: Secondary | ICD-10-CM | POA: Diagnosis not present

## 2022-09-11 DIAGNOSIS — G301 Alzheimer's disease with late onset: Secondary | ICD-10-CM

## 2022-09-11 DIAGNOSIS — R413 Other amnesia: Secondary | ICD-10-CM | POA: Insufficient documentation

## 2022-09-11 DIAGNOSIS — R69 Illness, unspecified: Secondary | ICD-10-CM | POA: Diagnosis not present

## 2022-09-11 DIAGNOSIS — R2689 Other abnormalities of gait and mobility: Secondary | ICD-10-CM

## 2022-09-11 MED ORDER — GALANTAMINE HYDROBROMIDE 12 MG PO TABS: 12.0000 mg | ORAL_TABLET | Freq: Two times a day (BID) | ORAL | 11 refills | Status: DC

## 2022-09-11 MED ORDER — ESCITALOPRAM OXALATE 5 MG PO TABS: 5.0000 mg | ORAL_TABLET | Freq: Every day | ORAL | 11 refills | Status: AC

## 2022-09-11 NOTE — Patient Instructions (Signed)
Always good to see you!   1.Continue Galantamine '12mg'$  twice a day   2. Continue to monitor hand shaking  Increase activity and brain games   Referral to PT  Start Lexapro  5 mg at night    Resume the CPAP  3. Follow-up in 6 months, call for any changes.   FALL PRECAUTIONS: Be cautious when walking. Scan the area for obstacles that may increase the risk of trips and falls. When getting up in the mornings, sit up at the edge of the bed for a few minutes before getting out of bed. Consider elevating the bed at the head end to avoid drop of blood pressure when getting up. Walk always in a well-lit room (use night lights in the walls). Avoid area rugs or power cords from appliances in the middle of the walkways. Use a walker or a cane if necessary and consider physical therapy for balance exercise. Get your eyesight checked regularly.  HOME SAFETY: Consider the safety of the kitchen when operating appliances like stoves, microwave oven, and blender. Consider having supervision and share cooking responsibilities until no longer able to participate in those. Accidents with firearms and other hazards in the house should be identified and addressed as well.  DRIVING: Regarding driving, in patients with progressive memory problems, driving will be impaired. We advise to have someone else do the driving if trouble finding directions or if minor accidents are reported. Independent driving assessment is available to determine safety of driving.  ABILITY TO BE LEFT ALONE: If patient is unable to contact 911 operator, consider using Life[employer], or when the need is there, arrange for someone to stay with patients. Smoking is a fire hazard, consider supervision or cessation. Risk of wandering should be assessed by caregiver and if detected at any point, supervision and safe proof recommendations should be instituted.  MEDICATION SUPERVISION: Inability to self-administer medication needs to be constantly  addressed. Implement a mechanism to ensure safe administration of the medications.  RECOMMENDATIONS FOR ALL PATIENTS WITH MEMORY PROBLEMS: 1. Continue to exercise (Recommend 30 minutes of walking everyday, or 3 hours every week) 2. Increase social interactions - continue going to [place] and enjoy social gatherings with friends and family 3. Eat healthy, avoid fried foods and eat more fruits and vegetables 4. Maintain adequate blood pressure, blood sugar, and blood cholesterol level. Reducing the risk of stroke and cardiovascular disease also helps promoting better memory. 5. Avoid stressful situations. Live a simple life and avoid aggravations. Organize your time and prepare for the next day in anticipation. 6. Sleep well, avoid any interruptions of sleep and avoid any distractions in the bedroom that may interfere with adequate sleep quality 7. Avoid sugar, avoid sweets as there is a strong link between excessive sugar intake, diabetes, and cognitive impairment The Mediterranean diet has been shown to help patients reduce the risk of progressive memory disorders and reduces cardiovascular risk. This includes eating fish, eat fruits and green leafy vegetables, nuts like almonds and hazelnuts, walnuts, and also use olive oil. Avoid fast foods and fried foods as much as possible. Avoid sweets and sugar as sugar use has been linked to worsening of memory function.  There is always a concern of gradual progression of memory problems. If this is the case, then we may need to adjust level of care according to patient needs. Support, both to the patient and caregiver, should then be put into place.

## 2022-09-17 ENCOUNTER — Ambulatory Visit: Payer: Medicare HMO | Attending: Physician Assistant | Admitting: Physical Therapy

## 2022-09-17 DIAGNOSIS — R251 Tremor, unspecified: Secondary | ICD-10-CM | POA: Diagnosis not present

## 2022-09-17 DIAGNOSIS — G8929 Other chronic pain: Secondary | ICD-10-CM | POA: Insufficient documentation

## 2022-09-17 DIAGNOSIS — R2681 Unsteadiness on feet: Secondary | ICD-10-CM | POA: Diagnosis not present

## 2022-09-17 DIAGNOSIS — R296 Repeated falls: Secondary | ICD-10-CM | POA: Diagnosis not present

## 2022-09-17 DIAGNOSIS — M25561 Pain in right knee: Secondary | ICD-10-CM | POA: Insufficient documentation

## 2022-09-17 DIAGNOSIS — R2689 Other abnormalities of gait and mobility: Secondary | ICD-10-CM | POA: Diagnosis not present

## 2022-09-17 DIAGNOSIS — M6281 Muscle weakness (generalized): Secondary | ICD-10-CM | POA: Diagnosis not present

## 2022-09-17 NOTE — Therapy (Signed)
OUTPATIENT PHYSICAL THERAPY NEURO EVALUATION/LOWER EXTREMITY EVALUATION   Patient Name: Darrell Perez MRN: [id number] DOB:1940/10/10, 82 y.o., male Today's Date: 09/17/2022   PCP: Tish Men, MD REFERRING PROVIDER: Rondel Jumbo, PA-C  END OF SESSION:  PT End of Session - 09/17/22 1452     Visit Number 1    Number of Visits 13   with eval   Date for PT Re-Evaluation 11/12/22    Authorization Type [employer]    Progress Note Due on Visit 10    PT Start Time 1450    PT Stop Time 1535    PT Time Calculation (min) 45 min    Equipment Utilized During Treatment Gait belt    Activity Tolerance Patient tolerated treatment well    Behavior During Therapy WFL for tasks assessed/performed             Past Medical History:  Diagnosis Date   Arthritis    Dementia ([place])    GERD (gastroesophageal reflux disease)    watches and NO ISSUES WHEN USING CPAP--  NO MEDS   Hiatal hernia    History of asthma    History of kidney stones    History of multinodular goiter    s/p  thyroid lobectomy 1967   Hypertension    Irritable bowel syndrome    Left knee DJD    Left ureteral calculus    Nephrolithiasis    bilateral non-obstrucive per CT 03-23-2017   OSA on CPAP    cpap does not know settings    Paroxysmal atrial fibrillation ([place]) newly dx 03-25-2017 (during hospitlization for urosepsis)   A-fib -- Aflutter w/ RVR -- pt started on cardizem and eliquis--- at discharge in [facility]/  cardiologist-  dr hochrein   Past Surgical History:  Procedure Laterality Date   CARPAL TUNNEL RELEASE Bilateral [place], BILATERAL  2005  &  2012   CYSTOSCOPY WITH STENT PLACEMENT Left 03/23/2017   Procedure: CYSTOSCOPY, LEFT RETROGRADE [place];  Surgeon: Lucas Mallow, MD;  Location: [facility];  Service: Urology;  Laterality: Left;   CYSTOSCOPY/URETEROSCOPY/HOLMIUM LASER/STENT PLACEMENT Left 04/10/2017   Procedure:  CYSTOSCOPY/URETEROSCOPY/HOLMIUM LASER/STENT PLACEMENT;  Surgeon: Festus Aloe, MD;  Location: [facility];  Service: Urology;  Laterality: Left;   EXTRACORPOREAL SHOCK WAVE LITHOTRIPSY     MULTIPLE   HEMIARTHROPLASTY RIGHT SHOULDER  2007   HOLMIUM LASER APPLICATION Left [id number]   Procedure: HOLMIUM LASER APPLICATION;  Surgeon: Fredricka Bonine, MD;  Location: [facility];  Service: Urology;  Laterality: Left;   [place]   removal of stones. (OPEN PROCUDURE)   REVERSE SHOULDER ARTHROPLASTY  10/09/2011   Procedure: REVERSE SHOULDER ARTHROPLASTY;  Surgeon: Marin Shutter, MD;  Location: [facility];  Service: Orthopedics;  Laterality: Right;  right hardware removal and reverse shoulder arthroplasty   THYROID LOBECTOMY  1967   goiter removed   TONSILLECTOMY     TOTAL KNEE ARTHROPLASTY  07/22/2012   Procedure: TOTAL KNEE ARTHROPLASTY;  Surgeon: Johnn Hai, MD;  Location: [facility];  Service: Orthopedics;  Laterality: Right;   TOTAL KNEE ARTHROPLASTY Left 12/16/2012   Procedure: LEFT TOTAL KNEE ARTHROPLASTY;  Surgeon: Johnn Hai, MD;  Location: [facility];  Service: Orthopedics;  Laterality: Left;   TRANSTHORACIC ECHOCARDIOGRAM  03/25/2017   mild LVH, ef 55-60%/  mild LAE/ trivial PR/  mild MR  TRIGGER FINGER RELEASE Right 2006   URETEROSOPIC STONE EXTRACTION  2002   Patient Active Problem List   Diagnosis Date Noted   Memory impairment 09/11/2022   Mild late onset Alzheimer's dementia without behavioral disturbance, psychotic disturbance, mood disturbance, or anxiety ([place]) 03/14/2022   Closed fracture of patella 06/06/2019   Pain in right knee 05/12/2019   Degenerative spondylolisthesis 04/08/2018   Degeneration of lumbar intervertebral disc 03/18/2018   Scoliosis of lumbar spine 03/18/2018   Spinal stenosis of lumbar region 03/18/2018   Mild dementia ([place]) 01/22/2018   History of total knee replacement, right  01/21/2018   Atrial flutter ([place]) 04/02/2017   Anticoagulated 04/02/2017   Right carotid bruit 04/02/2017   Sepsis ([place]) 03/25/2017   Pyelonephritis 03/25/2017   Left ureteral calculus    Ureteral stone 03/23/2017   Nephrolithiasis 03/23/2017   BPH (benign prostatic hypertrophy) 02/03/2013   Extrinsic asthma, unspecified 01/27/2013   Essential hypertension, benign 01/27/2013   Gout, unspecified 01/27/2013   Left knee DJD 12/16/2012   Right knee DJD 07/22/2012    ONSET DATE: 09/11/2022  REFERRING DIAG: R25.1 (ICD-10-CM) - Tremor of right hand  THERAPY DIAG:  Other abnormalities of gait and mobility  Tremor  Repeated falls  Muscle weakness (generalized)  Unsteadiness on feet  Chronic pain of right knee  Rationale for Evaluation and Treatment: Rehabilitation  SUBJECTIVE:                                                                                                                                                                                             SUBJECTIVE STATEMENT: Pt reports that he fell down the stairs a few years ago (2020 per chart) and landed on his R knee. Pt has a history of bilateral knee replacements and was told he has a crack in the knee under his replacement, would not benefit from surgery. Pt reports he has had issues with pain and balance since the fall. Pt states that he was doing PT about a month ago but it made his knee hurt worse (at [facility] [employer] [facility]).  Pt accompanied by: self and significant other wife [name]  PERTINENT HISTORY: history of hypertension, goiter s/p thyroid lobectomy, atrial fibrillation, iron deficiency anemia, with mild dementia, likely due to Alzheimer's disease  PAIN:  Are you having pain? Yes: NPRS scale: not rated/10 Pain location: R knee pain Pain description: dull pain Aggravating factors: standing or walking for extended period of time; sit to standing Relieving factors: pain patch  PRECAUTIONS:  Fall  WEIGHT BEARING RESTRICTIONS: No  FALLS: Has patient fallen in last 6 months? Yes. Number  of falls 3 in the last week; wife has to get help to get him back up  LIVING ENVIRONMENT: Lives with: lives with their family and lives with their spouse Lives in: House/apartment Stairs: No; ramped entrance to one level home Has following equipment at home: Single point cane, Environmental consultant - 2 wheeled, and Environmental consultant - 4 wheeled  PLOF: Independent with gait and Independent with transfers  PATIENT GOALS: "to walk pretty good without my knee hurting" "the longer I stay on it the worse it gets"  OBJECTIVE:   DIAGNOSTIC FINDINGS:  R knee xray 03/04/2019 after pt's fall: FINDINGS: Status post right knee replacement with normal alignment and intact hardware. No acute displaced fracture. Small knee effusion   IMPRESSION: Status post right knee replacement. No acute osseous abnormality. Small knee effusion  COGNITION: Overall cognitive status: History of cognitive impairments - at baseline (Alzheimer's with mild dementia)   SENSATION: WFL   LOWER EXTREMITY MMT:    MMT Right Eval Left Eval  Hip flexion 5 5  Hip extension    Hip abduction    Hip adduction    Hip internal rotation    Hip external rotation    Knee flexion 4 4  Knee extension 4 5  Ankle dorsiflexion 5 5  Ankle plantarflexion    Ankle inversion    Ankle eversion    (Blank rows = not tested)  BED MOBILITY:  Mod I, needs increased time  TRANSFERS: Assistive device utilized: Lobbyist  Sit to stand: Modified independence Stand to sit: Modified independence Chair to chair: Modified independence Floor:  not assessed at eval  GAIT: Gait pattern: decreased hip/knee flexion- Right and shuffling Distance walked: various clinic distances Assistive device utilized: Quad cane small base Level of assistance: Modified independence Comments: occasional use of 3-pronged cane, otherwise carries cane  FUNCTIONAL TESTS:    [facility] PT Assessment - 09/17/22 1509       Ambulation/Gait   Gait velocity 32.8 ft over 20 sec = 1.64 ft/sec      Standardized Balance Assessment   Standardized Balance Assessment Timed Up and Go Test;Five Times Sit to Stand;Berg Balance Test    Five times sit to stand comments  23.9 sec   no UE support     Berg Balance Test   Sit to Stand Able to stand  independently using hands    Standing Unsupported Able to stand 2 minutes with supervision    Sitting with Back Unsupported but Feet Supported on Floor or Stool Able to sit safely and securely 2 minutes    Stand to Sit Sits safely with minimal use of hands    Transfers Able to transfer safely, definite need of hands    Standing Unsupported with Eyes Closed Able to stand 10 seconds with supervision    Standing Unsupported with Feet Together Able to place feet together independently and stand for 1 minute with supervision    From Standing, Reach Forward with Outstretched Arm Reaches forward but needs supervision    From Standing Position, Pick up Object from Floor Unable to try/needs assist to keep balance    From Standing Position, Turn to Look Behind Over each Shoulder Looks behind one side only/other side shows less weight shift    Turn 360 Degrees Needs close supervision or verbal cueing    Standing Unsupported, Alternately Place Feet on Step/Stool Needs assistance to keep from falling or unable to try    Standing Unsupported, One Foot in [place] to take  small step independently and hold 30 seconds    Standing on One Leg Able to lift leg independently and hold 5-10 seconds    Total Score 33    Berg comment: 33/56, high fall risk      Timed Up and Go Test   TUG Normal TUG    Normal TUG (seconds) 24.43   no AD           PATIENT SURVEYS:  LEFS 26/80, 32.5% of normal function  TODAY'S TREATMENT:                                                                                                                               PT  evaluation   PATIENT EDUCATION: Education details: Eval findings, PT POC, OM results and functional implications Person educated: Patient and Spouse Education method: Customer service manager Education comprehension: verbalized understanding, returned demonstration, and needs further education  HOME EXERCISE PROGRAM: To be initiated  GOALS: Goals reviewed with patient? Yes  SHORT TERM GOALS: Target date: 10/08/2022  Pt will be independent with initial HEP for improved strength, balance, transfers and gait. Baseline: Goal status: INITIAL  2.  Pt will improve gait velocity to at least 1.75 ft/sec for improved gait efficiency and performance at SBA level  Baseline: 1.64 ft/sec with SBA (3/20) Goal status: INITIAL  3.  Pt will improve normal TUG to less than or equal to 21 seconds for improved functional mobility and decreased fall risk. Baseline: 24.43 sec (3/20) Goal status: INITIAL  4.  Pt will improve Berg score to 36/56 for decreased fall risk Baseline: 33/56 (3/20) Goal status: INITIAL  5.  Pt will improve 5 x STS to less than or equal to 20 seconds to demonstrate improved functional strength and transfer efficiency.  Baseline: 23.9 sec (3/20) Goal status: INITIAL   LONG TERM GOALS: Target date: 10/29/2022  Pt will be independent with final HEP for improved strength, balance, transfers and gait. Baseline:  Goal status: INITIAL  2.  Pt will improve gait velocity to at least 2.0 ft/sec for improved gait efficiency and performance at mod I level  Baseline: 1.64 ft/sec SBA (3/20) Goal status: INITIAL  3.  Pt will improve normal TUG to less than or equal to 18 seconds for improved functional mobility and decreased fall risk. Baseline: 24.43 sec (3/20) Goal status: INITIAL  4.  Pt will improve Berg score to 39/56 for decreased fall risk Baseline: 33/56 (3/20) Goal status: INITIAL  5.  Pt will improve score on LEFS to 30/80 to demonstrate decreased disability  level Baseline: 26/80 (3/20) Goal status: INITIAL   ASSESSMENT:  CLINICAL IMPRESSION: Patient is a 82 year old male referred to Neuro OPPT for gait impairments.   Pt's PMH is significant for: hypertension, goiter s/p thyroid lobectomy, atrial fibrillation, iron deficiency anemia, with mild dementia, likely due to Alzheimer's disease. The following deficits were present during the exam: decreased gait speed, decreased functional LE strength,  decreased balance. Based on his gait speed, 5xSTS score, TUG score, and Berg score as well as history of significant number of falls, pt is an increased risk for falls. Pt would benefit from skilled PT to address these impairments and functional limitations to maximize functional mobility independence.  OBJECTIVE IMPAIRMENTS: Abnormal gait, decreased balance, decreased cognition, difficulty walking, decreased strength, and pain.   ACTIVITY LIMITATIONS: standing, squatting, and stairs  PARTICIPATION LIMITATIONS: community activity and yard work  PERSONAL FACTORS: Age, Time since onset of injury/illness/exacerbation, and 3+ comorbidities:    history of  hypertension, goiter s/p thyroid lobectomy, atrial fibrillation, iron deficiency anemia, with mild dementia, likely due to Alzheimer's diseaseare also affecting patient's functional outcome.   REHAB POTENTIAL: Fair time since onset, age  CLINICAL DECISION MAKING: Stable/uncomplicated  EVALUATION COMPLEXITY: Low  PLAN:  PT FREQUENCY: 1-2x/week  PT DURATION: 6 weeks  PLANNED INTERVENTIONS: Therapeutic exercises, Therapeutic activity, Neuromuscular re-education, Balance training, Gait training, Patient/Family education, Self Care, Joint mobilization, Stair training, Orthotic/Fit training, DME instructions, Aquatic Therapy, Dry Needling, Cognitive remediation, Electrical stimulation, Cryotherapy, Moist heat, Taping, Manual therapy, and Re-evaluation  PLAN FOR NEXT SESSION: initiate HEP for RLE  strengthening, balance based on Berg deficits   Excell Seltzer, PT, DPT, CSRS 09/17/2022, 3:38 PM

## 2022-09-23 DIAGNOSIS — N529 Male erectile dysfunction, unspecified: Secondary | ICD-10-CM | POA: Diagnosis not present

## 2022-09-23 DIAGNOSIS — R32 Unspecified urinary incontinence: Secondary | ICD-10-CM | POA: Diagnosis not present

## 2022-09-23 DIAGNOSIS — Z008 Encounter for other general examination: Secondary | ICD-10-CM | POA: Diagnosis not present

## 2022-09-23 DIAGNOSIS — I10 Essential (primary) hypertension: Secondary | ICD-10-CM | POA: Diagnosis not present

## 2022-09-23 DIAGNOSIS — D6869 Other thrombophilia: Secondary | ICD-10-CM | POA: Diagnosis not present

## 2022-09-23 DIAGNOSIS — E876 Hypokalemia: Secondary | ICD-10-CM | POA: Diagnosis not present

## 2022-09-23 DIAGNOSIS — R600 Localized edema: Secondary | ICD-10-CM | POA: Diagnosis not present

## 2022-09-23 DIAGNOSIS — I4891 Unspecified atrial fibrillation: Secondary | ICD-10-CM | POA: Diagnosis not present

## 2022-09-23 DIAGNOSIS — N400 Enlarged prostate without lower urinary tract symptoms: Secondary | ICD-10-CM | POA: Diagnosis not present

## 2022-09-23 DIAGNOSIS — E669 Obesity, unspecified: Secondary | ICD-10-CM | POA: Diagnosis not present

## 2022-09-23 DIAGNOSIS — F03A0 Unspecified dementia, mild, without behavioral disturbance, psychotic disturbance, mood disturbance, and anxiety: Secondary | ICD-10-CM | POA: Diagnosis not present

## 2022-09-23 DIAGNOSIS — Z9181 History of falling: Secondary | ICD-10-CM | POA: Diagnosis not present

## 2022-09-23 DIAGNOSIS — K219 Gastro-esophageal reflux disease without esophagitis: Secondary | ICD-10-CM | POA: Diagnosis not present

## 2022-09-24 ENCOUNTER — Ambulatory Visit (HOSPITAL_COMMUNITY): Payer: Medicare HMO | Attending: Cardiovascular Disease

## 2022-09-24 DIAGNOSIS — R072 Precordial pain: Secondary | ICD-10-CM | POA: Diagnosis not present

## 2022-09-24 LAB — ECHOCARDIOGRAM COMPLETE
Area-P 1/2: 2.99 cm2
S' Lateral: 2.5 cm

## 2022-09-25 ENCOUNTER — Ambulatory Visit: Payer: Medicare HMO | Admitting: Physical Therapy

## 2022-09-29 ENCOUNTER — Ambulatory Visit: Payer: Medicare HMO | Attending: Physician Assistant | Admitting: Physical Therapy

## 2022-09-29 ENCOUNTER — Ambulatory Visit: Payer: Medicare HMO | Admitting: Rehabilitative and Restorative Service Providers"

## 2022-09-29 DIAGNOSIS — M6281 Muscle weakness (generalized): Secondary | ICD-10-CM | POA: Insufficient documentation

## 2022-09-29 DIAGNOSIS — G8929 Other chronic pain: Secondary | ICD-10-CM | POA: Diagnosis not present

## 2022-09-29 DIAGNOSIS — R2689 Other abnormalities of gait and mobility: Secondary | ICD-10-CM | POA: Diagnosis not present

## 2022-09-29 DIAGNOSIS — R296 Repeated falls: Secondary | ICD-10-CM | POA: Insufficient documentation

## 2022-09-29 DIAGNOSIS — M25561 Pain in right knee: Secondary | ICD-10-CM | POA: Insufficient documentation

## 2022-09-29 DIAGNOSIS — R2681 Unsteadiness on feet: Secondary | ICD-10-CM | POA: Diagnosis not present

## 2022-09-29 NOTE — Therapy (Signed)
OUTPATIENT PHYSICAL THERAPY NEURO TREATMENT   Patient Name: Darrell Perez MRN: [id number] DOB:August 04, 1940, 82 y.o., male Today's Date: 09/29/2022   PCP: Darrell Men, MD REFERRING PROVIDER: Rondel Jumbo, PA-C  END OF SESSION:  PT End of Session - 09/29/22 1110     Visit Number 2    Number of Visits 13   with eval   Date for PT Re-Evaluation 11/12/22    Authorization Type [employer]    Progress Note Due on Visit 10    PT Start Time 1105   pt arrived late   PT Stop Time 1147    PT Time Calculation (min) 42 min    Equipment Utilized During Treatment Gait belt    Activity Tolerance Patient tolerated treatment well    Behavior During Therapy WFL for tasks assessed/performed              Past Medical History:  Diagnosis Date   Arthritis    Dementia ([place])    GERD (gastroesophageal reflux disease)    watches and NO ISSUES WHEN USING CPAP--  NO MEDS   Hiatal hernia    History of asthma    History of kidney stones    History of multinodular goiter    s/p  thyroid lobectomy 1967   Hypertension    Irritable bowel syndrome    Left knee DJD    Left ureteral calculus    Nephrolithiasis    bilateral non-obstrucive per CT 03-23-2017   OSA on CPAP    cpap does not know settings    Paroxysmal atrial fibrillation ([place]) newly dx 03-25-2017 (during hospitlization for urosepsis)   A-fib -- Aflutter w/ RVR -- pt started on cardizem and eliquis--- at discharge in NSR/  cardiologist-  dr Darrell Perez   Past Surgical History:  Procedure Laterality Date   CARPAL TUNNEL RELEASE Bilateral [place], BILATERAL  2005  &  2012   CYSTOSCOPY WITH STENT PLACEMENT Left 03/23/2017   Procedure: CYSTOSCOPY, LEFT RETROGRADE [place];  Surgeon: Darrell Mallow, MD;  Location: [facility];  Service: Urology;  Laterality: Left;   CYSTOSCOPY/URETEROSCOPY/HOLMIUM LASER/STENT PLACEMENT Left 04/10/2017   Procedure:  CYSTOSCOPY/URETEROSCOPY/HOLMIUM LASER/STENT PLACEMENT;  Surgeon: Darrell Aloe, MD;  Location: [facility];  Service: Urology;  Laterality: Left;   EXTRACORPOREAL SHOCK WAVE LITHOTRIPSY     MULTIPLE   HEMIARTHROPLASTY RIGHT SHOULDER  2007   HOLMIUM LASER APPLICATION Left [id number]   Procedure: HOLMIUM LASER APPLICATION;  Surgeon: Darrell Bonine, MD;  Location: [facility];  Service: Urology;  Laterality: Left;   [place]   removal of stones. (OPEN PROCUDURE)   REVERSE SHOULDER ARTHROPLASTY  10/09/2011   Procedure: REVERSE SHOULDER ARTHROPLASTY;  Surgeon: Darrell Shutter, MD;  Location: [place];  Service: Orthopedics;  Laterality: Right;  right hardware removal and reverse shoulder arthroplasty   THYROID LOBECTOMY  1967   goiter removed   TONSILLECTOMY     TOTAL KNEE ARTHROPLASTY  07/22/2012   Procedure: TOTAL KNEE ARTHROPLASTY;  Surgeon: Darrell Hai, MD;  Location: [facility];  Service: Orthopedics;  Laterality: Right;   TOTAL KNEE ARTHROPLASTY Left 12/16/2012   Procedure: LEFT TOTAL KNEE ARTHROPLASTY;  Surgeon: Darrell Hai, MD;  Location: [facility];  Service: Orthopedics;  Laterality: Left;   TRANSTHORACIC ECHOCARDIOGRAM  03/25/2017   mild LVH, ef 55-60%/  mild LAE/ trivial PR/  mild  MR   TRIGGER FINGER RELEASE Right 2006   URETEROSOPIC STONE EXTRACTION  2002   Patient Active Problem List   Diagnosis Date Noted   Memory impairment 09/11/2022   Mild late onset Alzheimer's dementia without behavioral disturbance, psychotic disturbance, mood disturbance, or anxiety 03/14/2022   Closed fracture of patella 06/06/2019   Pain in right knee 05/12/2019   Degenerative spondylolisthesis 04/08/2018   Degeneration of lumbar intervertebral disc 03/18/2018   Scoliosis of lumbar spine 03/18/2018   Spinal stenosis of lumbar region 03/18/2018   Mild dementia 01/22/2018   History of total knee replacement, right 01/21/2018   Atrial  flutter 04/02/2017   Anticoagulated 04/02/2017   Right carotid bruit 04/02/2017   Sepsis 03/25/2017   Pyelonephritis 03/25/2017   Left ureteral calculus    Ureteral stone 03/23/2017   Nephrolithiasis 03/23/2017   BPH (benign prostatic hypertrophy) 02/03/2013   Extrinsic asthma, unspecified 01/27/2013   Essential hypertension, benign 01/27/2013   Gout, unspecified 01/27/2013   Left knee DJD 12/16/2012   Right knee DJD 07/22/2012    ONSET DATE: 09/11/2022  REFERRING DIAG: R25.1 (ICD-10-CM) - Tremor of right hand  THERAPY DIAG:  Other abnormalities of gait and mobility  Repeated falls  Muscle weakness (generalized)  Unsteadiness on feet  Rationale for Evaluation and Treatment: Rehabilitation  SUBJECTIVE:                                                                                                                                                                                             SUBJECTIVE STATEMENT: Pt reports no falls since last visit. Pt reports 2/10 pain in his R knee today. Pt notes edema in distal LLE and foot.  Pt accompanied by: self and significant other [name] (in lobby)  PERTINENT HISTORY: history of hypertension, goiter s/p thyroid lobectomy, atrial fibrillation, iron deficiency anemia, with mild dementia, likely due to Alzheimer's disease  PAIN:  Are you having pain? Yes: NPRS scale: not rated/10 Pain location: R knee pain Pain description: dull pain Aggravating factors: standing or walking for extended period of time; sit to standing Relieving factors: pain patch  PRECAUTIONS: Fall  WEIGHT BEARING RESTRICTIONS: No  FALLS: Has patient fallen in last 6 months? Yes. Number of falls 3 in the last week; wife has to get help to get him back up  LIVING ENVIRONMENT: Lives with: lives with their family and lives with their spouse Lives in: House/apartment Stairs: No; ramped entrance to one level home Has following equipment at home: Single  point cane, Walker - 2 wheeled, and [employer] - 4 wheeled  PLOF: Independent with gait and Independent with transfers  PATIENT GOALS: "to walk pretty good without my knee hurting" "the longer I stay on it the worse it gets"  OBJECTIVE:   DIAGNOSTIC FINDINGS:  R knee xray 03/04/2019 after pt's fall: FINDINGS: Status post right knee replacement with normal alignment and intact hardware. No acute displaced fracture. Small knee effusion   IMPRESSION: Status post right knee replacement. No acute osseous abnormality. Small knee effusion  COGNITION: Overall cognitive status: History of cognitive impairments - at baseline (Alzheimer's with mild dementia)   SENSATION: WFL   LOWER EXTREMITY MMT:    MMT Right Eval Left Eval  Hip flexion 5 5  Hip extension    Hip abduction    Hip adduction    Hip internal rotation    Hip external rotation    Knee flexion 4 4  Knee extension 4 5  Ankle dorsiflexion 5 5  Ankle plantarflexion    Ankle inversion    Ankle eversion    (Blank rows = not tested)   TODAY'S TREATMENT:                                                                                                                              TherEx Supine RLE quad strengthening therex: SAQ x 10 reps with 5 sec hold SLR x 10 reps Quad set x 10 reps with 5 sec hold Bridges x 10 reps with 5 sec hold  Added to HEP, see bolded below  TherAct Fall recovery from mat on floor: Pt needs CGA to slowly descend to floor from seated on mat table, uses chair for support. Pt then able to transition to sitting on floor, to quadruped, to tall-kneeling, to standing with use of mat table and min A for balance. Discussed fall recovery techniques, calling EMS if needed, etc.   Ambulation weaving through cones 3 ft apart and then 2.5 ft apart with no AD and CGA for balance, increased challenge with narrowing of space between cones.  Ambulation with v/c to turn around randomly with intermittent use of 3  pronged cane and min A for balance, pt with decreased balance noted with turns.   PATIENT EDUCATION: Education details: initiated HEP, fall recovery Person educated: Patient and Spouse Education method: Explanation, Demonstration, and Handouts Education comprehension: verbalized understanding, returned demonstration, and needs further education  HOME EXERCISE PROGRAM: Access Code: [email or web address] URL: [email or web address] Date: 09/29/2022 Prepared by: Excell Seltzer  Exercises - Supine Knee Extension Strengthening  - 1 x daily - 7 x weekly - 3 sets - 10 reps - 5 sec hold - Active Straight Leg Raise with Quad Set  - 1 x daily - 7 x weekly - 3 sets - 10 reps - Supine Bridge  - 1 x daily - 7 x weekly - 3 sets - 10 reps - 5 sec hold    GOALS: Goals reviewed with patient? Yes  SHORT TERM GOALS: Target date: 10/08/2022  Pt will be independent with initial HEP for improved strength,  balance, transfers and gait. Baseline: Goal status: INITIAL  2.  Pt will improve gait velocity to at least 1.75 ft/sec for improved gait efficiency and performance at SBA level  Baseline: 1.64 ft/sec with SBA (3/20) Goal status: INITIAL  3.  Pt will improve normal TUG to less than or equal to 21 seconds for improved functional mobility and decreased fall risk. Baseline: 24.43 sec (3/20) Goal status: INITIAL  4.  Pt will improve Berg score to 36/56 for decreased fall risk Baseline: 33/56 (3/20) Goal status: INITIAL  5.  Pt will improve 5 x STS to less than or equal to 20 seconds to demonstrate improved functional strength and transfer efficiency.  Baseline: 23.9 sec (3/20) Goal status: INITIAL   LONG TERM GOALS: Target date: 10/29/2022  Pt will be independent with final HEP for improved strength, balance, transfers and gait. Baseline:  Goal status: INITIAL  2.  Pt will improve gait velocity to at least 2.0 ft/sec for improved gait efficiency and performance at mod I level  Baseline:  1.64 ft/sec SBA (3/20) Goal status: INITIAL  3.  Pt will improve normal TUG to less than or equal to 18 seconds for improved functional mobility and decreased fall risk. Baseline: 24.43 sec (3/20) Goal status: INITIAL  4.  Pt will improve Berg score to 39/56 for decreased fall risk Baseline: 33/56 (3/20) Goal status: INITIAL  5.  Pt will improve score on LEFS to 30/80 to demonstrate decreased disability level Baseline: 26/80 (3/20) Goal status: INITIAL   ASSESSMENT:  CLINICAL IMPRESSION: Emphasis of skilled PT session on initiating HEP for R quad strengthening, working on fall recovery, and working on dynamic standing balance. Pt with no increase in R knee pain with strengthening therex, can benefit from continued strengthening of this limb for pain control. Pt does well with fall recovery with min A needed, can benefit from continued practice. Additionally, pt can continue to benefit from skilled therapy services to address impaired balance leading to increased fall risk. Continue POC.   OBJECTIVE IMPAIRMENTS: Abnormal gait, decreased balance, decreased cognition, difficulty walking, decreased strength, and pain.   ACTIVITY LIMITATIONS: standing, squatting, and stairs  PARTICIPATION LIMITATIONS: community activity and yard work  PERSONAL FACTORS: Age, Time since onset of injury/illness/exacerbation, and 3+ comorbidities:    history of  hypertension, goiter s/p thyroid lobectomy, atrial fibrillation, iron deficiency anemia, with mild dementia, likely due to Alzheimer's diseaseare also affecting patient's functional outcome.   REHAB POTENTIAL: Fair time since onset, age  CLINICAL DECISION MAKING: Stable/uncomplicated  EVALUATION COMPLEXITY: Low  PLAN:  PT FREQUENCY: 1-2x/week  PT DURATION: 6 weeks  PLANNED INTERVENTIONS: Therapeutic exercises, Therapeutic activity, Neuromuscular re-education, Balance training, Gait training, Patient/Family education, Self Care, Joint  mobilization, Stair training, Orthotic/Fit training, DME instructions, Aquatic Therapy, Dry Needling, Cognitive remediation, Electrical stimulation, Cryotherapy, Moist heat, Taping, Manual therapy, and Re-evaluation  PLAN FOR NEXT SESSION: add to HEP for RLE strengthening, balance; gait across uneven surfaces, work on increasing step length and step height   Excell Seltzer, PT, DPT, CSRS 09/29/2022, 11:47 AM

## 2022-10-01 ENCOUNTER — Ambulatory Visit: Payer: Medicare HMO | Admitting: Physical Therapy

## 2022-10-01 DIAGNOSIS — R2681 Unsteadiness on feet: Secondary | ICD-10-CM

## 2022-10-01 DIAGNOSIS — R296 Repeated falls: Secondary | ICD-10-CM | POA: Diagnosis not present

## 2022-10-01 DIAGNOSIS — R2689 Other abnormalities of gait and mobility: Secondary | ICD-10-CM

## 2022-10-01 DIAGNOSIS — M6281 Muscle weakness (generalized): Secondary | ICD-10-CM | POA: Diagnosis not present

## 2022-10-01 DIAGNOSIS — G8929 Other chronic pain: Secondary | ICD-10-CM | POA: Diagnosis not present

## 2022-10-01 DIAGNOSIS — M25561 Pain in right knee: Secondary | ICD-10-CM | POA: Diagnosis not present

## 2022-10-01 NOTE — Therapy (Signed)
OUTPATIENT PHYSICAL THERAPY NEURO TREATMENT   Patient Name: Darrell Perez MRN: [id number] DOB:06/13/1941, 82 y.o., male Today's Date: 10/01/2022   PCP: Tish Men, MD REFERRING PROVIDER: Rondel Jumbo, PA-C  END OF SESSION:  PT End of Session - 10/01/22 1452     Visit Number 3    Number of Visits 13   with eval   Date for PT Re-Evaluation 11/12/22    Authorization Type [employer] Medicare    Progress Note Due on Visit 10    PT Start Time 1450   Previous pt session ran late   PT Stop Time 1530    PT Time Calculation (min) 40 min    Equipment Utilized During Treatment Gait belt    Activity Tolerance Patient tolerated treatment well    Behavior During Therapy WFL for tasks assessed/performed              Past Medical History:  Diagnosis Date   Arthritis    Dementia ([place])    GERD (gastroesophageal reflux disease)    watches and NO ISSUES WHEN USING CPAP--  NO MEDS   Hiatal hernia    History of asthma    History of kidney stones    History of multinodular goiter    s/p  thyroid lobectomy 1967   Hypertension    Irritable bowel syndrome    Left knee DJD    Left ureteral calculus    Nephrolithiasis    bilateral non-obstrucive per CT 03-23-2017   OSA on CPAP    cpap does not know settings    Paroxysmal atrial fibrillation ([place]) newly dx 03-25-2017 (during hospitlization for urosepsis)   A-fib -- Aflutter w/ RVR -- pt started on cardizem and eliquis--- at discharge in NSR/  cardiologist-  dr Percival Spanish   Past Surgical History:  Procedure Laterality Date   CARPAL TUNNEL RELEASE Bilateral [place], BILATERAL  2005  &  2012   CYSTOSCOPY WITH STENT PLACEMENT Left 03/23/2017   Procedure: CYSTOSCOPY, LEFT RETROGRADE [place];  Surgeon: Lucas Mallow, MD;  Location: [facility];  Service: Urology;  Laterality: Left;   CYSTOSCOPY/URETEROSCOPY/HOLMIUM LASER/STENT PLACEMENT Left 04/10/2017   Procedure:  CYSTOSCOPY/URETEROSCOPY/HOLMIUM LASER/STENT PLACEMENT;  Surgeon: Festus Aloe, MD;  Location: [facility];  Service: Urology;  Laterality: Left;   EXTRACORPOREAL SHOCK WAVE LITHOTRIPSY     MULTIPLE   HEMIARTHROPLASTY RIGHT SHOULDER  2007   HOLMIUM LASER APPLICATION Left [id number]   Procedure: HOLMIUM LASER APPLICATION;  Surgeon: Fredricka Bonine, MD;  Location: [facility];  Service: Urology;  Laterality: Left;   [place]   removal of stones. (OPEN PROCUDURE)   REVERSE SHOULDER ARTHROPLASTY  10/09/2011   Procedure: REVERSE SHOULDER ARTHROPLASTY;  Surgeon: Marin Shutter, MD;  Location: [place];  Service: Orthopedics;  Laterality: Right;  right hardware removal and reverse shoulder arthroplasty   THYROID LOBECTOMY  1967   goiter removed   TONSILLECTOMY     TOTAL KNEE ARTHROPLASTY  07/22/2012   Procedure: TOTAL KNEE ARTHROPLASTY;  Surgeon: Johnn Hai, MD;  Location: [facility];  Service: Orthopedics;  Laterality: Right;   TOTAL KNEE ARTHROPLASTY Left 12/16/2012   Procedure: LEFT TOTAL KNEE ARTHROPLASTY;  Surgeon: Johnn Hai, MD;  Location: [facility];  Service: Orthopedics;  Laterality: Left;   TRANSTHORACIC ECHOCARDIOGRAM  03/25/2017   mild LVH, ef 55-60%/  mild LAE/ trivial PR/  mild MR   TRIGGER FINGER RELEASE Right 2006   URETEROSOPIC STONE EXTRACTION  2002   Patient Active Problem List   Diagnosis Date Noted   Memory impairment 09/11/2022   Mild late onset Alzheimer's dementia without behavioral disturbance, psychotic disturbance, mood disturbance, or anxiety 03/14/2022   Closed fracture of patella 06/06/2019   Pain in right knee 05/12/2019   Degenerative spondylolisthesis 04/08/2018   Degeneration of lumbar intervertebral disc 03/18/2018   Scoliosis of lumbar spine 03/18/2018   Spinal stenosis of lumbar region 03/18/2018   Mild dementia 01/22/2018   History of total knee replacement, right 01/21/2018   Atrial  flutter 04/02/2017   Anticoagulated 04/02/2017   Right carotid bruit 04/02/2017   Sepsis 03/25/2017   Pyelonephritis 03/25/2017   Left ureteral calculus    Ureteral stone 03/23/2017   Nephrolithiasis 03/23/2017   BPH (benign prostatic hypertrophy) 02/03/2013   Extrinsic asthma, unspecified 01/27/2013   Essential hypertension, benign 01/27/2013   Gout, unspecified 01/27/2013   Left knee DJD 12/16/2012   Right knee DJD 07/22/2012    ONSET DATE: 09/11/2022  REFERRING DIAG: R25.1 (ICD-10-CM) - Tremor of right hand  THERAPY DIAG:  Other abnormalities of gait and mobility  Muscle weakness (generalized)  Unsteadiness on feet  Rationale for Evaluation and Treatment: Rehabilitation  SUBJECTIVE:                                                                                                                                                                                             SUBJECTIVE STATEMENT: Pt reports doing okay today. Still reports 2/10 pain in R knee.   Pt accompanied by: self and significant other [name] (in lobby)  PERTINENT HISTORY: history of hypertension, goiter s/p thyroid lobectomy, atrial fibrillation, iron deficiency anemia, with mild dementia, likely due to Alzheimer's disease  PAIN:  Are you having pain? Yes: NPRS scale: 2/10 Pain location: R knee pain Pain description: dull pain Aggravating factors: standing or walking for extended period of time; sit to standing Relieving factors: pain patch  PRECAUTIONS: Fall  WEIGHT BEARING RESTRICTIONS: No  FALLS: Has patient fallen in last 6 months? Yes. Number of falls 3 in the last week; wife has to get help to get him back up  LIVING ENVIRONMENT: Lives with: lives with their family and lives with their spouse Lives in: House/apartment Stairs: No; ramped entrance to one level home Has following equipment at home: Single point cane, Walker - 2 wheeled, and [employer] - 4 wheeled  PLOF: Independent with  gait and Independent with transfers  PATIENT GOALS: "to walk pretty good without my knee hurting" "the longer I  stay on it the worse it gets"  OBJECTIVE:   DIAGNOSTIC FINDINGS:  R knee xray 03/04/2019 after pt's fall: FINDINGS: Status post right knee replacement with normal alignment and intact hardware. No acute displaced fracture. Small knee effusion   IMPRESSION: Status post right knee replacement. No acute osseous abnormality. Small knee effusion  COGNITION: Overall cognitive status: History of cognitive impairments - at baseline (Alzheimer's with mild dementia)   SENSATION: WFL   LOWER EXTREMITY MMT:    MMT Right Eval Left Eval  Hip flexion 5 5  Hip extension    Hip abduction    Hip adduction    Hip internal rotation    Hip external rotation    Knee flexion 4 4  Knee extension 4 5  Ankle dorsiflexion 5 5  Ankle plantarflexion    Ankle inversion    Ankle eversion    (Blank rows = not tested)   TODAY'S TREATMENT:                                                                                                                               Ther Ex SciFit multi-peaks level 4 for 8 minutes using BUE/BLEs for neural priming for reciprocal movement, dynamic cardiovascular warmup and global strength. Pt denied knee pain with activity.    NMR  Step over beam to random colored dot  Blaze pods  6 on random reach  Round 1: 23 hits  Round 2: two colors, dual task: 16 hits Round 3: two colors, dual-task w/2 2" beams as barriers: 9 hits (unable to remember proper categories)  On airex Alt cone taps, x10 per side  Alt retro step, x10  Alt fwd step, x10     PATIENT EDUCATION: Education details: initiated HEP, fall recovery Person educated: Patient and Spouse Education method: Explanation, Demonstration, and Handouts Education comprehension: verbalized understanding, returned demonstration, and needs further education  HOME EXERCISE PROGRAM: Access Code:  [email or web address] URL: [email or web address] Date: 09/29/2022 Prepared by: Excell Seltzer  Exercises - Supine Knee Extension Strengthening  - 1 x daily - 7 x weekly - 3 sets - 10 reps - 5 sec hold - Active Straight Leg Raise with Quad Set  - 1 x daily - 7 x weekly - 3 sets - 10 reps - Supine Bridge  - 1 x daily - 7 x weekly - 3 sets - 10 reps - 5 sec hold    GOALS: Goals reviewed with patient? Yes  SHORT TERM GOALS: Target date: 10/08/2022  Pt will be independent with initial HEP for improved strength, balance, transfers and gait. Baseline: Goal status: INITIAL  2.  Pt will improve gait velocity to at least 1.75 ft/sec for improved gait efficiency and performance at SBA level  Baseline: 1.64 ft/sec with SBA (3/20) Goal status: INITIAL  3.  Pt will improve normal TUG to less than or equal to 21 seconds for improved functional mobility and decreased fall risk. Baseline: 24.43 sec (3/20)  Goal status: INITIAL  4.  Pt will improve Berg score to 36/56 for decreased fall risk Baseline: 33/56 (3/20) Goal status: INITIAL  5.  Pt will improve 5 x STS to less than or equal to 20 seconds to demonstrate improved functional strength and transfer efficiency.  Baseline: 23.9 sec (3/20) Goal status: INITIAL   LONG TERM GOALS: Target date: 10/29/2022  Pt will be independent with final HEP for improved strength, balance, transfers and gait. Baseline:  Goal status: INITIAL  2.  Pt will improve gait velocity to at least 2.0 ft/sec for improved gait efficiency and performance at mod I level  Baseline: 1.64 ft/sec SBA (3/20) Goal status: INITIAL  3.  Pt will improve normal TUG to less than or equal to 18 seconds for improved functional mobility and decreased fall risk. Baseline: 24.43 sec (3/20) Goal status: INITIAL  4.  Pt will improve Berg score to 39/56 for decreased fall risk Baseline: 33/56 (3/20) Goal status: INITIAL  5.  Pt will improve score on LEFS to 30/80 to demonstrate  decreased disability level Baseline: 26/80 (3/20) Goal status: INITIAL   ASSESSMENT:  CLINICAL IMPRESSION: Emphasis of skilled PT session on initiating HEP for R quad strengthening, working on fall recovery, and working on dynamic standing balance. Pt with no increase in R knee pain with strengthening therex, can benefit from continued strengthening of this limb for pain control. Pt does well with fall recovery with min A needed, can benefit from continued practice. Additionally, pt can continue to benefit from skilled therapy services to address impaired balance leading to increased fall risk. Continue POC.   OBJECTIVE IMPAIRMENTS: Abnormal gait, decreased balance, decreased cognition, difficulty walking, decreased strength, and pain.   ACTIVITY LIMITATIONS: standing, squatting, and stairs  PARTICIPATION LIMITATIONS: community activity and yard work  PERSONAL FACTORS: Age, Time since onset of injury/illness/exacerbation, and 3+ comorbidities:    history of  hypertension, goiter s/p thyroid lobectomy, atrial fibrillation, iron deficiency anemia, with mild dementia, likely due to Alzheimer's diseaseare also affecting patient's functional outcome.   REHAB POTENTIAL: Fair time since onset, age  CLINICAL DECISION MAKING: Stable/uncomplicated  EVALUATION COMPLEXITY: Low  PLAN:  PT FREQUENCY: 1-2x/week  PT DURATION: 6 weeks  PLANNED INTERVENTIONS: Therapeutic exercises, Therapeutic activity, Neuromuscular re-education, Balance training, Gait training, Patient/Family education, Self Care, Joint mobilization, Stair training, Orthotic/Fit training, DME instructions, Aquatic Therapy, Dry Needling, Cognitive remediation, Electrical stimulation, Cryotherapy, Moist heat, Taping, Manual therapy, and Re-evaluation  PLAN FOR NEXT SESSION: add to HEP for RLE strengthening, balance; gait across uneven surfaces, work on increasing step length and step height   Jaxtin Raimondo E Kenyatte Chatmon, PT, DPT  10/01/2022,  3:32 PM

## 2022-10-01 NOTE — Therapy (Deleted)
OUTPATIENT OCCUPATIONAL THERAPY ORTHO EVALUATION  Patient Name: Darrell Perez MRN: [id number] DOB:[birth year], 82 y.o., male Today's Date: 09/24/2022  PCP: Syliva Overman MD REFERRING PROVIDER: Rondel Jumbo, PA-C   END OF SESSION:   Past Medical History:  Diagnosis Date   Arthritis    Dementia ([place])    GERD (gastroesophageal reflux disease)    watches and NO ISSUES WHEN USING CPAP--  NO MEDS   Hiatal hernia    History of asthma    History of kidney stones    History of multinodular goiter    s/p  thyroid lobectomy 1967   Hypertension    Irritable bowel syndrome    Left knee DJD    Left ureteral calculus    Nephrolithiasis    bilateral non-obstrucive per CT 03-23-2017   OSA on CPAP    cpap does not know settings    Paroxysmal atrial fibrillation ([place]) newly dx 03-25-2017 (during hospitlization for urosepsis)   A-fib -- Aflutter w/ RVR -- pt started on cardizem and eliquis--- at discharge in [facility]R/  cardiologist-  dr Percival Spanish   Past Surgical History:  Procedure Laterality Date   CARPAL TUNNEL RELEASE Bilateral [place], BILATERAL  2005  &  2012   CYSTOSCOPY WITH STENT PLACEMENT Left 03/23/2017   Procedure: CYSTOSCOPY, LEFT RETROGRADE [place];  Surgeon: Lucas Mallow, MD;  Location: [facility];  Service: Urology;  Laterality: Left;   CYSTOSCOPY/URETEROSCOPY/HOLMIUM LASER/STENT PLACEMENT Left 04/10/2017   Procedure: CYSTOSCOPY/URETEROSCOPY/HOLMIUM LASER/STENT PLACEMENT;  Surgeon: Festus Aloe, MD;  Location[facility];  Service: Urology;  Laterality: Left;   EXTRACORPOREAL SHOCK WAVE LITHOTRIPSY     MULTIPLE   HEMIARTHROPLASTY RIGHT SHOULDER  2007   HOLMIUM LASER APPLICATION Left [id number]   Procedure: HOLMIUM LASER APPLICATION;  Surgeon: Fredricka Bonine, MD;  Location: [facility];  Service: Urology;  Laterality: Left;   [place]   removal of stones. (OPEN PROCUDURE)   REVERSE SHOULDER ARTHROPLASTY  10/09/2011   Procedure: REVERSE SHOULDER ARTHROPLASTY;  Surgeon: Marin Shutter, MD;  Location: [facility];  Service: Orthopedics;  Laterality: Right;  right hardware removal and reverse shoulder arthroplasty   THYROID LOBECTOMY  1967   goiter removed   TONSILLECTOMY     TOTAL KNEE ARTHROPLASTY  07/22/2012   Procedure: TOTAL KNEE ARTHROPLASTY;  Surgeon: Johnn Hai, MD;  Location: [facility];  Service: Orthopedics;  Laterality: Right;   TOTAL KNEE ARTHROPLASTY Left 12/16/2012   Procedure: LEFT TOTAL KNEE ARTHROPLASTY;  Surgeon: Johnn Hai, MD;  Location: [facility];  Service: Orthopedics;  Laterality: Left;   TRANSTHORACIC ECHOCARDIOGRAM  03/25/2017   mild LVH, ef 55-60%/  mild LAE/ trivial PR/  mild MR   TRIGGER FINGER RELEASE Right 2006   URETEROSOPIC STONE EXTRACTION  2002   Patient Active Problem List   Diagnosis Date Noted   Memory impairment 09/11/2022   Mild late onset Alzheimer's dementia without behavioral disturbance, psychotic disturbance, mood disturbance, or anxiety ([place]) 03/14/2022   Closed fracture of patella 06/06/2019   Pain in right knee 05/12/2019   Degenerative spondylolisthesis 04/08/2018   Degeneration of lumbar intervertebral disc 03/18/2018   Scoliosis of lumbar spine 03/18/2018   Spinal stenosis of lumbar region 03/18/2018   Mild dementia ([place]) 01/22/2018   History of total knee replacement, right 01/21/2018   Atrial flutter ([place]) 04/02/2017   Anticoagulated 04/02/2017  Right carotid bruit 04/02/2017   Sepsis ([place]) 03/25/2017   Pyelonephritis 03/25/2017   Left ureteral calculus    Ureteral stone 03/23/2017   Nephrolithiasis 03/23/2017   BPH (benign prostatic hypertrophy) 02/03/2013   Extrinsic asthma, unspecified 01/27/2013   Essential hypertension, benign 01/27/2013   Gout, unspecified 01/27/2013   Left knee DJD 12/16/2012   Right knee DJD 07/22/2012    ONSET DATE:  ***  REFERRING DIAG: R25.1 (ICD-10-CM) - Tremor   THERAPY DIAG:  No diagnosis found.  Rationale for Evaluation and Treatment: Rehabilitation  SUBJECTIVE:   SUBJECTIVE STATEMENT: He states ***.   Pt accompanied by: {accompnied:27141}  PERTINENT HISTORY: ***  PRECAUTIONS: {Therapy precautions:24002}  WEIGHT BEARING RESTRICTIONS: {Yes ***/No:24003}  PAIN:  Are you having pain? {OPRCPAIN:27236}  FALLS: Has patient fallen in last 6 months? {fallsyesno:27318}  LIVING ENVIRONMENT: Lives with: {OPRC lives with:25569::"lives with their family"} Lives in: {Lives in:25570} Stairs: {opstairs:27293} Has following equipment at home: {Assistive devices:23999}  PLOF: {PLOF:24004}  PATIENT GOALS: ***  OBJECTIVE: (All objective assessments below are from initial evaluation on: 10/06/22 unless otherwise specified.)    HAND DOMINANCE: Right ***  ADLs: Overall ADLs: States decreased ability to grab, hold household objects, pain and inability to open containers, perform FMS tasks (manipulate fasteners on clothing), mild to moderate bathing problems as well. ***   FUNCTIONAL OUTCOME MEASURES: Eval: Quck DASH ***% impairment today  (Higher % Score  =  More Impairment)    Patient Specific Functional Scale: *** (***, ***, ***)  (Higher Score  =  Better Ability for the Selected Tasks)     Patient Rated Wrist Evaluation (PRWE): Pain: ***/50; Function: ***/50; Total Score: ***/100 (Higher Score  =  More Pain and/or Debility)    UPPER EXTREMITY ROM     Shoulder to Wrist AROM Right eval Left eval  Shoulder flexion    Shoulder abduction    Shoulder extension    Shoulder internal rotation    Shoulder external rotation    Elbow flexion    Elbow extension    Forearm supination    Forearm pronation     Wrist flexion    Wrist extension    Wrist ulnar deviation    Wrist radial deviation    Functional dart thrower's motion (F-DTM) in ulnar flexion    F-DTM in radial extension      (Blank rows = not tested)   Hand AROM Right eval Left eval  Full Fist Ability (or Gap to Distal Palmar Crease)    Thumb Opposition  (Kapandji Scale)     Thumb MCP (0-60)    Thumb IP (0-80)    Thumb Radial Abduction Span     Thumb Palmar Abduction Span     Index MCP (0-90)     Index PIP (0-100)     Index DIP (0-70)      Long MCP (0-90)      Long PIP (0-100)      Long DIP (0-70)      Ring MCP (0-90)      Ring PIP (0-100)      Ring DIP (0-70)      Little MCP (0-90)      Little PIP (0-100)      Little DIP (0-70)      (Blank rows = not tested)   UPPER EXTREMITY MMT:    Eval: *** NT at eval due to recent and still healing injuries. Will be tested when appropriate.   MMT Right TBD Left TBD  Shoulder flexion  Shoulder abduction    Shoulder adduction    Shoulder extension    Shoulder internal rotation    Shoulder external rotation    Middle trapezius    Lower trapezius    Elbow flexion    Elbow extension    Forearm supination    Forearm pronation    Wrist flexion    Wrist extension    Wrist ulnar deviation    Wrist radial deviation    (Blank rows = not tested)  HAND FUNCTION: Eval: Observed weakness in affected hand.  Grip strength Right: *** lbs, Left: *** lbs   COORDINATION: Eval: Observed coordination impairments with affected hand. Box and Blocks Test: *** Blocks today (*** is [facility]); 9 Hole Peg Test Right: ***sec, Left: *** sec (*** sec is WFL)   SENSATION: Eval: *** Light touch intact today, though diminished around sx area    EDEMA:   Eval: *** Mildly swollen in hand and wrist today, ***cm circumferentially around ***  COGNITION: Eval: Overall cognitive status: WFL for evaluation today ***  OBSERVATIONS:   Eval: ***   TODAY'S TREATMENT:  Post-evaluation treatment: ***    PATIENT EDUCATION: Education details: See tx section above for details  Person educated: Patient Education method: Veterinary surgeon, Teach back, Handouts  Education  comprehension: States and demonstrates understanding, Additional Education required    HOME EXERCISE PROGRAM: See tx section above for details    GOALS: Goals reviewed with patient? Yes   SHORT TERM GOALS: (STG required if POC>30 days) Target Date: ***  Pt will obtain protective, custom orthotic. Goal status: TBD/PRN,  MET ***  2.  Pt will demo/state understanding of initial HEP to improve pain levels and prerequisite motion. Goal status: INITIAL   LONG TERM GOALS: Target Date: ***  Pt will improve functional ability by decreased impairment per Quick DASH / PSFS / PRWE assessment from *** to *** or better, for better quality of life. Goal status: INITIAL  2.  Pt will improve grip strength in *** hand from ***lbs to at least ***lbs for functional use at home and in IADLs. Goal status: INITIAL  3.  Pt will improve A/ROM in *** from *** to at least ***, to have functional motion for tasks like reach and grasp.  Goal status: INITIAL  4.  Pt will improve strength in *** from *** MMT to at least *** MMT to have increased functional ability to carry out selfcare and higher-level homecare tasks with no difficulty. Goal status: INITIAL  5.  Pt will improve coordination skills in ***, as seen by better score on *** testing to have increased functional ability to carry out fine motor tasks (fasteners, etc.) and more complex, coordinated IADLs (meal prep, sports, etc.).  Goal status: INITIAL  6.  Pt will decrease pain at worst from ***/10 to ***/10 or better to have better sleep and occupational participation in daily roles. Goal status: INITIAL   ASSESSMENT:  CLINICAL IMPRESSION: Patient is a 82 y.o. male who was seen today for occupational therapy evaluation for ***.   PERFORMANCE DEFICITS: in functional skills including {OT physical skills:25468}, cognitive skills including {OT cognitive skills:25469}, and psychosocial skills including {OT psychosocial skills:25470}.    IMPAIRMENTS: are limiting patient from {OT performance deficits:25471}.   COMORBIDITIES: {Comorbidities:25485} that affects occupational performance. Patient will benefit from skilled OT to address above impairments and improve overall function.  MODIFICATION OR ASSISTANCE TO COMPLETE EVALUATION: {OT modification:25474}  OT OCCUPATIONAL PROFILE AND HISTORY: {OT PROFILE AND CF:5604106  CLINICAL DECISION  MAKING: {OT CDM:25475}  REHAB POTENTIAL: {rehabpotential:25112}  EVALUATION COMPLEXITY: {Evaluation complexity:25115}      PLAN:  OT FREQUENCY: {rehab frequency:25116}  OT DURATION: {rehab duration:25117}  PLANNED INTERVENTIONS: {OT Interventions:25467}  RECOMMENDED OTHER SERVICES: ***  CONSULTED AND AGREED WITH PLAN OF CARE: [id number]  PLAN FOR NEXT SESSION: Benito Mccreedy, OT 09/24/2022, 2:39 PM

## 2022-10-06 ENCOUNTER — Ambulatory Visit: Payer: Medicare HMO | Admitting: Physical Therapy

## 2022-10-06 ENCOUNTER — Ambulatory Visit: Payer: Medicare HMO | Admitting: Rehabilitative and Restorative Service Providers"

## 2022-10-06 DIAGNOSIS — R2681 Unsteadiness on feet: Secondary | ICD-10-CM

## 2022-10-06 DIAGNOSIS — M6281 Muscle weakness (generalized): Secondary | ICD-10-CM

## 2022-10-06 DIAGNOSIS — N401 Enlarged prostate with lower urinary tract symptoms: Secondary | ICD-10-CM | POA: Diagnosis not present

## 2022-10-06 DIAGNOSIS — R2689 Other abnormalities of gait and mobility: Secondary | ICD-10-CM | POA: Diagnosis not present

## 2022-10-06 DIAGNOSIS — G8929 Other chronic pain: Secondary | ICD-10-CM | POA: Diagnosis not present

## 2022-10-06 DIAGNOSIS — R3129 Other microscopic hematuria: Secondary | ICD-10-CM | POA: Diagnosis not present

## 2022-10-06 DIAGNOSIS — R351 Nocturia: Secondary | ICD-10-CM | POA: Diagnosis not present

## 2022-10-06 DIAGNOSIS — R296 Repeated falls: Secondary | ICD-10-CM

## 2022-10-06 DIAGNOSIS — M25561 Pain in right knee: Secondary | ICD-10-CM | POA: Diagnosis not present

## 2022-10-06 NOTE — Therapy (Signed)
OUTPATIENT PHYSICAL THERAPY NEURO TREATMENT   Patient Name: Darrell Perez MRN: [id number] DOB:1941-01-12, 82 y.o., male Today's Date: 10/06/2022   PCP: Glendell Docker, MD REFERRING PROVIDER: Marcos Eke, PA-C  END OF SESSION:  PT End of Session - 10/06/22 1402     Visit Number 4    Number of Visits 13   with eval   Date for PT Re-Evaluation 11/12/22    Authorization Type [employer]    Progress Note Due on Visit 10    PT Start Time 1400    PT Stop Time 1445    PT Time Calculation (min) 45 min    Equipment Utilized During Treatment Gait belt    Activity Tolerance Patient tolerated treatment well    Behavior During Therapy WFL for tasks assessed/performed               Past Medical History:  Diagnosis Date   Arthritis    Dementia ([facility])    GERD (gastroesophageal reflux disease)    watches and NO ISSUES WHEN USING CPAP--  NO MEDS   Hiatal hernia    History of asthma    History of kidney stones    History of multinodular goiter    s/p  thyroid lobectomy 1967   Hypertension    Irritable bowel syndrome    Left knee DJD    Left ureteral calculus    Nephrolithiasis    bilateral non-obstrucive per CT 03-23-2017   OSA on CPAP    cpap does not know settings    Paroxysmal atrial fibrillation ([facility]) newly dx 03-25-2017 (during hospitlization for urosepsis)   A-fib -- Aflutter w/ RVR -- pt started on cardizem and eliquis--- at discharge in [facility]/  cardiologist-  dr Antoine Poche   Past Surgical History:  Procedure Laterality Date   CARPAL TUNNEL RELEASE Bilateral 1989  & 1991    CATARACT EXTRACTION W/ INTRAOCULAR LENS  IMPLANT, BILATERAL  2005  &  2012   CYSTOSCOPY WITH STENT PLACEMENT Left 03/23/2017   Procedure: CYSTOSCOPY, LEFT RETROGRADE WITH STENT PLACEMENT;  Surgeon: Crista Elliot, MD;  Location: [facility];  Service: Urology;  Laterality: Left;   CYSTOSCOPY/URETEROSCOPY/HOLMIUM LASER/STENT PLACEMENT Left 04/10/2017   Procedure:  CYSTOSCOPY/URETEROSCOPY/HOLMIUM LASER/STENT PLACEMENT;  Surgeon: Jerilee Field, MD;  Location[facility];  Service: Urology;  Laterality: Left;   EXTRACORPOREAL SHOCK WAVE LITHOTRIPSY     MULTIPLE   HEMIARTHROPLASTY RIGHT SHOULDER  2007   HOLMIUM LASER APPLICATION Left 11/23/2012   Procedure: HOLMIUM LASER APPLICATION;  Surgeon: Antony Haste, MD;  Location: [facility];  Service: Urology;  Laterality: Left;   KIDNEY STONE SURGERY  1972 and 1977   removal of stones. (OPEN PROCUDURE)   REVERSE SHOULDER ARTHROPLASTY  10/09/2011   Procedure: REVERSE SHOULDER ARTHROPLASTY;  Surgeon: Senaida Lange, MD;  Location: [facility];  Service: Orthopedics;  Laterality: Right;  right hardware removal and reverse shoulder arthroplasty   THYROID LOBECTOMY  1967   goiter removed   TONSILLECTOMY     TOTAL KNEE ARTHROPLASTY  07/22/2012   Procedure: TOTAL KNEE ARTHROPLASTY;  Surgeon: Javier Docker, MD;  Location: [facility];  Service: Orthopedics;  Laterality: Right;   TOTAL KNEE ARTHROPLASTY Left 12/16/2012   Procedure: LEFT TOTAL KNEE ARTHROPLASTY;  Surgeon: Javier Docker, MD;  Location: [facility];  Service: Orthopedics;  Laterality: Left;   TRANSTHORACIC ECHOCARDIOGRAM  03/25/2017   mild LVH, ef 55-60%/  mild LAE/ trivial PR/  mild MR  TRIGGER FINGER RELEASE Right 2006   URETEROSOPIC STONE EXTRACTION  2002   Patient Active Problem List   Diagnosis Date Noted   Memory impairment 09/11/2022   Mild late onset Alzheimer's dementia without behavioral disturbance, psychotic disturbance, mood disturbance, or anxiety 03/14/2022   Closed fracture of patella 06/06/2019   Pain in right knee 05/12/2019   Degenerative spondylolisthesis 04/08/2018   Degeneration of lumbar intervertebral disc 03/18/2018   Scoliosis of lumbar spine 03/18/2018   Spinal stenosis of lumbar region 03/18/2018   Mild dementia 01/22/2018   History of total knee replacement, right 01/21/2018   Atrial  flutter 04/02/2017   Anticoagulated 04/02/2017   Right carotid bruit 04/02/2017   Sepsis 03/25/2017   Pyelonephritis 03/25/2017   Left ureteral calculus    Ureteral stone 03/23/2017   Nephrolithiasis 03/23/2017   BPH (benign prostatic hypertrophy) 02/03/2013   Extrinsic asthma, unspecified 01/27/2013   Essential hypertension, benign 01/27/2013   Gout, unspecified 01/27/2013   Left knee DJD 12/16/2012   Right knee DJD 07/22/2012    ONSET DATE: 09/11/2022  REFERRING DIAG: R25.1 (ICD-10-CM) - Tremor of right hand  THERAPY DIAG:  Other abnormalities of gait and mobility  Muscle weakness (generalized)  Unsteadiness on feet  Repeated falls  Rationale for Evaluation and Treatment: Rehabilitation  SUBJECTIVE:                                                                                                                                                                                             SUBJECTIVE STATEMENT: Pt reports no changes since last session, no falls. Pt reports ongoing chronic knee pain but not horrible. Pt reports most difficulty maintaining balance when turning quickly and when walking across thick grass.  Pt accompanied by: self and significant other [name] (in lobby)  PERTINENT HISTORY: history of hypertension, goiter s/p thyroid lobectomy, atrial fibrillation, iron deficiency anemia, with mild dementia, likely due to Alzheimer's disease  PAIN:  Are you having pain? Yes: NPRS scale: 2/10 Pain location: R knee pain Pain description: dull pain Aggravating factors: standing or walking for extended period of time; sit to standing Relieving factors: pain patch  PRECAUTIONS: Fall  WEIGHT BEARING RESTRICTIONS: No  FALLS: Has patient fallen in last 6 months? Yes. Number of falls 3 in the last week; wife has to get help to get him back up  LIVING ENVIRONMENT: Lives with: lives with their family and lives with their spouse Lives in:  House/apartment Stairs: No; ramped entrance to one level home Has following equipment at home: Single point cane, Walker - 2 wheeled, and [employer] - 4 wheeled  PLOF: Independent with  gait and Independent with transfers  PATIENT GOALS: "to walk pretty good without my knee hurting" "the longer I stay on it the worse it gets"  OBJECTIVE:   DIAGNOSTIC FINDINGS:  R knee xray 03/04/2019 after pt's fall: FINDINGS: Status post right knee replacement with normal alignment and intact hardware. No acute displaced fracture. Small knee effusion   IMPRESSION: Status post right knee replacement. No acute osseous abnormality. Small knee effusion  COGNITION: Overall cognitive status: History of cognitive impairments - at baseline (Alzheimer's with mild dementia)   SENSATION: WFL   LOWER EXTREMITY MMT:    MMT Right Eval Left Eval  Hip flexion 5 5  Hip extension    Hip abduction    Hip adduction    Hip internal rotation    Hip external rotation    Knee flexion 4 4  Knee extension 4 5  Ankle dorsiflexion 5 5  Ankle plantarflexion    Ankle inversion    Ankle eversion    (Blank rows = not tested)   TODAY'S TREATMENT:                                                                                                                               Ther Ex SciFit multi-peaks level 4 for 8 minutes using BUE/BLEs for neural priming for reciprocal movement, dynamic cardiovascular warmup and global strength. Pt denied knee pain with activity.    NMR  For improved step length, LE coordination and single leg stability:  Gait across uneven mat on floor with CGA, cues to increase step length (pt shuffling) Added in alt L/R gumdrop taps with decreased stability in SLS noted Static stance on blue mat on floor with min A performing alt L/R Blaze pod taps Pt scores 11, 6, 10 reps 1st round; 12, 10, 11 reps 2nd round Pt does have several LOB and needs min to mod A to recover to prevent fall Cone weave  with no AD and CGA with focus on maintaining balance with quick turns, several LOB but able to recover with CGA  PATIENT EDUCATION: Education details: Continue HEP (reprinted for patient) Person educated: Patient and Spouse Education method: Explanation, Demonstration, and Handouts Education comprehension: verbalized understanding, returned demonstration, and needs further education  HOME EXERCISE PROGRAM: Access Code: [email or web address] URL: [email or web address] Date: 09/29/2022 Prepared by: Peter Congoaylor Aramis Weil  Exercises - Supine Knee Extension Strengthening  - 1 x daily - 7 x weekly - 3 sets - 10 reps - 5 sec hold - Active Straight Leg Raise with Quad Set  - 1 x daily - 7 x weekly - 3 sets - 10 reps - Supine Bridge  - 1 x daily - 7 x weekly - 3 sets - 10 reps - 5 sec hold    GOALS: Goals reviewed with patient? Yes  SHORT TERM GOALS: Target date: 10/08/2022  Pt will be independent with initial HEP for improved strength, balance, transfers and gait. Baseline: Goal status: INITIAL  2.  Pt will improve gait velocity to at least 1.75 ft/sec for improved gait efficiency and performance at SBA level  Baseline: 1.64 ft/sec with SBA (3/20) Goal status: INITIAL  3.  Pt will improve normal TUG to less than or equal to 21 seconds for improved functional mobility and decreased fall risk. Baseline: 24.43 sec (3/20) Goal status: INITIAL  4.  Pt will improve Berg score to 36/56 for decreased fall risk Baseline: 33/56 (3/20) Goal status: INITIAL  5.  Pt will improve 5 x STS to less than or equal to 20 seconds to demonstrate improved functional strength and transfer efficiency.  Baseline: 23.9 sec (3/20) Goal status: INITIAL   LONG TERM GOALS: Target date: 10/29/2022  Pt will be independent with final HEP for improved strength, balance, transfers and gait. Baseline:  Goal status: INITIAL  2.  Pt will improve gait velocity to at least 2.0 ft/sec for improved gait efficiency and  performance at mod I level  Baseline: 1.64 ft/sec SBA (3/20) Goal status: INITIAL  3.  Pt will improve normal TUG to less than or equal to 18 seconds for improved functional mobility and decreased fall risk. Baseline: 24.43 sec (3/20) Goal status: INITIAL  4.  Pt will improve Berg score to 39/56 for decreased fall risk Baseline: 33/56 (3/20) Goal status: INITIAL  5.  Pt will improve score on LEFS to 30/80 to demonstrate decreased disability level Baseline: 26/80 (3/20) Goal status: INITIAL   ASSESSMENT:  CLINICAL IMPRESSION: Emphasis of skilled PT session on continuing to work on increasing B step length/clearance and SLS stability. Pt continues to exhibit shuffling gait pattern at times as well as decreased balance in SLS. Pt continues to benefit from skilled therapy services to improve his balance and gait mechanics in order to decrease his fall risk. Pt also remains limited by cognitive impairments (Alzheimer's'/mild dementia) limiting his ability to recall techniques worked on in therapy sessions. Continue POC.   OBJECTIVE IMPAIRMENTS: Abnormal gait, decreased balance, decreased cognition, difficulty walking, decreased strength, and pain.   ACTIVITY LIMITATIONS: standing, squatting, and stairs  PARTICIPATION LIMITATIONS: community activity and yard work  PERSONAL FACTORS: Age, Time since onset of injury/illness/exacerbation, and 3+ comorbidities:    history of  hypertension, goiter s/p thyroid lobectomy, atrial fibrillation, iron deficiency anemia, with mild dementia, likely due to Alzheimer's diseaseare also affecting patient's functional outcome.   REHAB POTENTIAL: Fair time since onset, age  CLINICAL DECISION MAKING: Stable/uncomplicated  EVALUATION COMPLEXITY: Low  PLAN:  PT FREQUENCY: 1-2x/week  PT DURATION: 6 weeks  PLANNED INTERVENTIONS: Therapeutic exercises, Therapeutic activity, Neuromuscular re-education, Balance training, Gait training, Patient/Family  education, Self Care, Joint mobilization, Stair training, Orthotic/Fit training, DME instructions, Aquatic Therapy, Dry Needling, Cognitive remediation, Electrical stimulation, Cryotherapy, Moist heat, Taping, Manual therapy, and Re-evaluation  PLAN FOR NEXT SESSION: assess STG, add to HEP for RLE strengthening, balance; gait across uneven surfaces, work on increasing step length and step height   Peter Congo, PT, DPT, CSRS  10/06/2022, 2:49 PM

## 2022-10-08 ENCOUNTER — Ambulatory Visit: Payer: Medicare HMO | Admitting: Physical Therapy

## 2022-10-08 DIAGNOSIS — R296 Repeated falls: Secondary | ICD-10-CM

## 2022-10-08 DIAGNOSIS — M6281 Muscle weakness (generalized): Secondary | ICD-10-CM | POA: Diagnosis not present

## 2022-10-08 DIAGNOSIS — R2681 Unsteadiness on feet: Secondary | ICD-10-CM | POA: Diagnosis not present

## 2022-10-08 DIAGNOSIS — M25561 Pain in right knee: Secondary | ICD-10-CM | POA: Diagnosis not present

## 2022-10-08 DIAGNOSIS — R2689 Other abnormalities of gait and mobility: Secondary | ICD-10-CM

## 2022-10-08 DIAGNOSIS — G8929 Other chronic pain: Secondary | ICD-10-CM | POA: Diagnosis not present

## 2022-10-08 NOTE — Therapy (Signed)
OUTPATIENT PHYSICAL THERAPY NEURO TREATMENT   Patient Name: Darrell Perez MRN: [id number] DOB:1940/07/31, 82 y.o., male Today's Date: 10/08/2022   PCP: Glendell DockerSchwartman, Neil R, MD REFERRING PROVIDER: Marcos EkeWertman, Sara E, PA-C  END OF SESSION:  PT End of Session - 10/08/22 1148     Visit Number 5    Number of Visits 13   with eval   Date for PT Re-Evaluation 11/12/22    Authorization Type [employer]    Progress Note Due on Visit 10    PT Start Time 1145    PT Stop Time 1230    PT Time Calculation (min) 45 min    Equipment Utilized During Treatment Gait belt    Activity Tolerance Patient tolerated treatment well    Behavior During Therapy WFL for tasks assessed/performed                Past Medical History:  Diagnosis Date   Arthritis    Dementia (HCC)    GERD (gastroesophageal reflux disease)    watches and NO ISSUES WHEN USING CPAP--  NO MEDS   Hiatal hernia    History of asthma    History of kidney stones    History of multinodular goiter    s/p  thyroid lobectomy 1967   Hypertension    Irritable bowel syndrome    Left knee DJD    Left ureteral calculus    Nephrolithiasis    bilateral non-obstrucive per CT 03-23-2017   OSA on CPAP    cpap does not know settings    Paroxysmal atrial fibrillation (HCC) newly dx 03-25-2017 (during hospitlization for urosepsis)   A-fib -- Aflutter w/ RVR -- pt started on cardizem and eliquis--- at discharge in [facility]/  cardiologist-  dr Antoine Pochehochrein   Past Surgical History:  Procedure Laterality Date   CARPAL TUNNEL RELEASE Bilateral 1989  & 1991    CATARACT EXTRACTION W/ INTRAOCULAR LENS  IMPLANT, BILATERAL  2005  &  2012   CYSTOSCOPY WITH STENT PLACEMENT Left 03/23/2017   Procedure: CYSTOSCOPY, LEFT RETROGRADE WITH STENT PLACEMENT;  Surgeon: Crista ElliotBell, Eugene D III, MD;  Location: [facility];  Service: Urology;  Laterality: Left;   CYSTOSCOPY/URETEROSCOPY/HOLMIUM LASER/STENT PLACEMENT Left 04/10/2017   Procedure:  CYSTOSCOPY/URETEROSCOPY/HOLMIUM LASER/STENT PLACEMENT;  Surgeon: Jerilee FieldEskridge, Matthew, MD;  [facility] [place];  Service: Urology;  Laterality: Left;   EXTRACORPOREAL SHOCK WAVE LITHOTRIPSY     MULTIPLE   HEMIARTHROPLASTY RIGHT SHOULDER  2007   HOLMIUM LASER APPLICATION Left 11/23/2012   Procedure: HOLMIUM LASER APPLICATION;  Surgeon: Antony HasteMatthew Ramsey Eskridge, MD;  [facility] [place];  Service: Urology;  Laterality: Left;   KIDNEY STONE SURGERY  1972 and 1977   removal of stones. (OPEN PROCUDURE)   REVERSE SHOULDER ARTHROPLASTY  10/09/2011   Procedure: REVERSE SHOULDER ARTHROPLASTY;  Surgeon: Senaida LangeKevin M Supple, MD;  Location: [facility];  Service: Orthopedics;  Laterality: Right;  right hardware removal and reverse shoulder arthroplasty   THYROID LOBECTOMY  1967   goiter removed   TONSILLECTOMY     TOTAL KNEE ARTHROPLASTY  07/22/2012   Procedure: TOTAL KNEE ARTHROPLASTY;  Surgeon: Javier DockerJeffrey C Beane, MD;  Location: [facility];  Service: Orthopedics;  Laterality: Right;   TOTAL KNEE ARTHROPLASTY Left 12/16/2012   Procedure: LEFT TOTAL KNEE ARTHROPLASTY;  Surgeon: Javier DockerJeffrey C Beane, MD;  Location: [facility];  Service: Orthopedics;  Laterality: Left;   TRANSTHORACIC ECHOCARDIOGRAM  03/25/2017   mild LVH, ef 55-60%/  mild LAE/ trivial PR/  mild MR  TRIGGER FINGER RELEASE Right 2006   URETEROSOPIC STONE EXTRACTION  2002   Patient Active Problem List   Diagnosis Date Noted   Memory impairment 09/11/2022   Mild late onset Alzheimer's dementia without behavioral disturbance, psychotic disturbance, mood disturbance, or anxiety 03/14/2022   Closed fracture of patella 06/06/2019   Pain in right knee 05/12/2019   Degenerative spondylolisthesis 04/08/2018   Degeneration of lumbar intervertebral disc 03/18/2018   Scoliosis of lumbar spine 03/18/2018   Spinal stenosis of lumbar region 03/18/2018   Mild dementia 01/22/2018   History of total knee replacement, right 01/21/2018   Atrial  flutter 04/02/2017   Anticoagulated 04/02/2017   Right carotid bruit 04/02/2017   Sepsis 03/25/2017   Pyelonephritis 03/25/2017   Left ureteral calculus    Ureteral stone 03/23/2017   Nephrolithiasis 03/23/2017   BPH (benign prostatic hypertrophy) 02/03/2013   Extrinsic asthma, unspecified 01/27/2013   Essential hypertension, benign 01/27/2013   Gout, unspecified 01/27/2013   Left knee DJD 12/16/2012   Right knee DJD 07/22/2012    ONSET DATE: 09/11/2022  REFERRING DIAG: R25.1 (ICD-10-CM) - Tremor of right hand  THERAPY DIAG:  Other abnormalities of gait and mobility  Muscle weakness (generalized)  Unsteadiness on feet  Repeated falls  Rationale for Evaluation and Treatment: Rehabilitation  SUBJECTIVE:                                                                                                                                                                                             SUBJECTIVE STATEMENT: Pt reports 5/10 in R knee today. No falls or other acute changes today. Pt moving a little bit slower today, reports it is due to the weather (rainy day).  Pt accompanied by: self and significant other [name] (in the car)  PERTINENT HISTORY: history of hypertension, goiter s/p thyroid lobectomy, atrial fibrillation, iron deficiency anemia, with mild dementia, likely due to Alzheimer's disease  PAIN:  Are you having pain? Yes: NPRS scale: 5/10 Pain location: R knee pain Pain description: dull pain Aggravating factors: standing or walking for extended period of time; sit to standing Relieving factors: pain patch  PRECAUTIONS: Fall  WEIGHT BEARING RESTRICTIONS: No  FALLS: Has patient fallen in last 6 months? Yes. Number of falls 3 in the last week; wife has to get help to get him back up  LIVING ENVIRONMENT: Lives with: lives with their family and lives with their spouse Lives in: House/apartment Stairs: No; ramped entrance to one level home Has following  equipment at home: Single point cane, Walker - 2 wheeled, and [employer] - 4 wheeled  PLOF: Independent with gait and  Independent with transfers  PATIENT GOALS: "to walk pretty good without my knee hurting" "the longer I stay on it the worse it gets"  OBJECTIVE:   DIAGNOSTIC FINDINGS:  R knee xray 03/04/2019 after pt's fall: FINDINGS: Status post right knee replacement with normal alignment and intact hardware. No acute displaced fracture. Small knee effusion   IMPRESSION: Status post right knee replacement. No acute osseous abnormality. Small knee effusion  COGNITION: Overall cognitive status: History of cognitive impairments - at baseline (Alzheimer's with mild dementia)   SENSATION: WFL   LOWER EXTREMITY MMT:    MMT Right Eval Left Eval  Hip flexion 5 5  Hip extension    Hip abduction    Hip adduction    Hip internal rotation    Hip external rotation    Knee flexion 4 4  Knee extension 4 5  Ankle dorsiflexion 5 5  Ankle plantarflexion    Ankle inversion    Ankle eversion    (Blank rows = not tested)   TODAY'S TREATMENT:                                                                                                                               Ther Ex Resisted sit to stands with green exercise band 3 x 10-15 reps Difficulty with not losing balance posteriorly and with eccentric control when sitting  Resisted 6" step-taps with RTB 3 x 10 reps B with BUE support  TherAct  [facility] PT Assessment - 10/08/22 1155       Ambulation/Gait   Gait velocity 32.8 ft over 15.22 sec = 2.16 ft/sec   with SBA and hurricane     Standardized Balance Assessment   Standardized Balance Assessment Berg Balance Test;Timed Up and Go Test;Five Times Sit to Stand    Five times sit to stand comments  16.34   no UE support     Berg Balance Test   Sit to Stand Able to stand without using hands and stabilize independently    Standing Unsupported Able to stand safely 2 minutes    Sitting  with Back Unsupported but Feet Supported on Floor or Stool Able to sit safely and securely 2 minutes    Stand to Sit Sits safely with minimal use of hands    Transfers Able to transfer safely, minor use of hands    Standing Unsupported with Eyes Closed Able to stand 10 seconds with supervision    Standing Unsupported with Feet Together Able to place feet together independently and stand for 1 minute with supervision    From Standing, Reach Forward with Outstretched Arm Reaches forward but needs supervision    From Standing Position, Pick up Object from Floor Able to pick up shoe, needs supervision    From Standing Position, Turn to Look Behind Over each Shoulder Looks behind one side only/other side shows less weight shift    Turn 360 Degrees Needs close supervision or verbal cueing  Standing Unsupported, Alternately Place Feet on Step/Stool Able to complete >2 steps/needs minimal assist   CGA   Standing Unsupported, One Foot in Front Able to take small step independently and hold 30 seconds    Standing on One Leg Tries to lift leg/unable to hold 3 seconds but remains standing independently    Total Score 38    Berg comment: 38/56, significant fall risk      Timed Up and Go Test   TUG Normal TUG    Normal TUG (seconds) 15.28   no AD and SBA           Sidesteps with L/R gumdrop tapping 3 x 10 ft each direction with CGA: Pt with decreased balance with SLS on LLE as compared to RLE, improves as task progresses   PATIENT EDUCATION: Education details: Continue HEP Person educated: Patient Education method: Medical illustrator Education comprehension: verbalized understanding, returned demonstration, and needs further education  HOME EXERCISE PROGRAM: Access Code: [email or web address] URL: [email or web address] Date: 09/29/2022 Prepared by: Peter Congo  Exercises - Supine Knee Extension Strengthening  - 1 x daily - 7 x weekly - 3 sets - 10 reps - 5 sec hold -  Active Straight Leg Raise with Quad Set  - 1 x daily - 7 x weekly - 3 sets - 10 reps - Supine Bridge  - 1 x daily - 7 x weekly - 3 sets - 10 reps - 5 sec hold    GOALS: Goals reviewed with patient? Yes  SHORT TERM GOALS: Target date: 10/08/2022  Pt will be independent with initial HEP for improved strength, balance, transfers and gait. Baseline: established HEP and handout provided several times, pt with poor recall of HEP and inconsistently performing at home Goal status: IN PROGRESS  2.  Pt will improve gait velocity to at least 1.75 ft/sec for improved gait efficiency and performance at SBA level  Baseline: 1.64 ft/sec with SBA (3/20), 2.16 ft/sec (4/10) Goal status: MET  3.  Pt will improve normal TUG to less than or equal to 21 seconds for improved functional mobility and decreased fall risk. Baseline: 24.43 sec (3/20), 15.28 sec (4/10) Goal status: MET  4.  Pt will improve Berg score to 36/56 for decreased fall risk Baseline: 33/56 (3/20), 38/56 (4/10) Goal status: MET  5.  Pt will improve 5 x STS to less than or equal to 20 seconds to demonstrate improved functional strength and transfer efficiency.  Baseline: 23.9 sec (3/20), 16.34 sec (4/10) Goal status: MET   LONG TERM GOALS: Target date: 10/29/2022  Pt will be independent with final HEP for improved strength, balance, transfers and gait. Baseline:  Goal status: INITIAL  2.  Pt will improve gait velocity to at least 2.25 ft/sec for improved gait efficiency and performance at mod I level  Baseline: 1.64 ft/sec SBA (3/20), 2.16 ft/sec (4/10) Goal status: REVISED/UPGRADED  3.  Pt will improve normal TUG to less than or equal to 13 seconds for improved functional mobility and decreased fall risk. Baseline: 24.43 sec (3/20), 15.28 sec (4/10) Goal status: REVISED/UPGRADED  4.  Pt will improve Berg score to 39/56 for decreased fall risk Baseline: 33/56 (3/20), 38/56 (4/10) Goal status: INITIAL  5.  Pt will improve  score on LEFS to 30/80 to demonstrate decreased disability level Baseline: 26/80 (3/20) Goal status: INITIAL   ASSESSMENT:  CLINICAL IMPRESSION: Emphasis of skilled PT session on assessing STG as well as continuing to work on SLS stability and functional LE strengthening .  Pt has met 4/5 STG due to improving his gait speed, his TUG score, his 5xSTS score, and his BBS score, indicated improved balance and decreased fall risk as well as improved LE functional strength. Pt did not meet his STG of being independent with his HEP as his cognitive impairments make it difficult for him to remember to work on his HEP or that he has even been given an HEP. Despite his cognitive limitations from Alzheimer's pt has still shown objective progress with therapy. Continue POC.   OBJECTIVE IMPAIRMENTS: Abnormal gait, decreased balance, decreased cognition, difficulty walking, decreased strength, and pain.   ACTIVITY LIMITATIONS: standing, squatting, and stairs  PARTICIPATION LIMITATIONS: community activity and yard work  PERSONAL FACTORS: Age, Time since onset of injury/illness/exacerbation, and 3+ comorbidities:    history of  hypertension, goiter s/p thyroid lobectomy, atrial fibrillation, iron deficiency anemia, with mild dementia, likely due to Alzheimer's diseaseare also affecting patient's functional outcome.   REHAB POTENTIAL: Fair time since onset, age  CLINICAL DECISION MAKING: Stable/uncomplicated  EVALUATION COMPLEXITY: Low  PLAN:  PT FREQUENCY: 1-2x/week  PT DURATION: 6 weeks  PLANNED INTERVENTIONS: Therapeutic exercises, Therapeutic activity, Neuromuscular re-education, Balance training, Gait training, Patient/Family education, Self Care, Joint mobilization, Stair training, Orthotic/Fit training, DME instructions, Aquatic Therapy, Dry Needling, Cognitive remediation, Electrical stimulation, Cryotherapy, Moist heat, Taping, Manual therapy, and Re-evaluation  PLAN FOR NEXT SESSION: add to  HEP for RLE strengthening (resisted step-taps, resisted sit to stands), balance; gait across uneven surfaces, work on increasing step length and step height, SLS (more difficult on LLE vs RLE)   Peter Congo, PT, DPT, CSRS  10/08/2022, 12:33 PM

## 2022-10-14 ENCOUNTER — Ambulatory Visit: Payer: Medicare HMO | Admitting: Physical Therapy

## 2022-10-14 DIAGNOSIS — D225 Melanocytic nevi of trunk: Secondary | ICD-10-CM | POA: Diagnosis not present

## 2022-10-14 DIAGNOSIS — G8929 Other chronic pain: Secondary | ICD-10-CM | POA: Diagnosis not present

## 2022-10-14 DIAGNOSIS — R2681 Unsteadiness on feet: Secondary | ICD-10-CM

## 2022-10-14 DIAGNOSIS — M25561 Pain in right knee: Secondary | ICD-10-CM | POA: Diagnosis not present

## 2022-10-14 DIAGNOSIS — L821 Other seborrheic keratosis: Secondary | ICD-10-CM | POA: Diagnosis not present

## 2022-10-14 DIAGNOSIS — D492 Neoplasm of unspecified behavior of bone, soft tissue, and skin: Secondary | ICD-10-CM | POA: Diagnosis not present

## 2022-10-14 DIAGNOSIS — M6281 Muscle weakness (generalized): Secondary | ICD-10-CM

## 2022-10-14 DIAGNOSIS — R2689 Other abnormalities of gait and mobility: Secondary | ICD-10-CM | POA: Diagnosis not present

## 2022-10-14 DIAGNOSIS — C4442 Squamous cell carcinoma of skin of scalp and neck: Secondary | ICD-10-CM | POA: Diagnosis not present

## 2022-10-14 DIAGNOSIS — R296 Repeated falls: Secondary | ICD-10-CM | POA: Diagnosis not present

## 2022-10-14 DIAGNOSIS — L570 Actinic keratosis: Secondary | ICD-10-CM | POA: Diagnosis not present

## 2022-10-14 DIAGNOSIS — L814 Other melanin hyperpigmentation: Secondary | ICD-10-CM | POA: Diagnosis not present

## 2022-10-14 DIAGNOSIS — R202 Paresthesia of skin: Secondary | ICD-10-CM | POA: Diagnosis not present

## 2022-10-14 DIAGNOSIS — Z08 Encounter for follow-up examination after completed treatment for malignant neoplasm: Secondary | ICD-10-CM | POA: Diagnosis not present

## 2022-10-14 DIAGNOSIS — Z85828 Personal history of other malignant neoplasm of skin: Secondary | ICD-10-CM | POA: Diagnosis not present

## 2022-10-14 NOTE — Therapy (Signed)
OUTPATIENT PHYSICAL THERAPY NEURO TREATMENT   Patient Name: Darrell Perez MRN: [id number] DOB:10/14/1940, 82 y.o., male Today's Date: 10/14/2022   PCP: Glendell Docker, MD REFERRING PROVIDER: Marcos Eke, PA-C  END OF SESSION:  PT End of Session - 10/14/22 1234     Visit Number 6    Number of Visits 13   with eval   Date for PT Re-Evaluation 11/12/22    Authorization Type [employer]    Progress Note Due on Visit 10    PT Start Time 1232    PT Stop Time 1311    PT Time Calculation (min) 39 min    Equipment Utilized During Treatment Gait belt    Activity Tolerance Patient tolerated treatment well    Behavior During Therapy WFL for tasks assessed/performed                 Past Medical History:  Diagnosis Date   Arthritis    Dementia (HCC)    GERD (gastroesophageal reflux disease)    watches and NO ISSUES WHEN USING CPAP--  NO MEDS   Hiatal hernia    History of asthma    History of kidney stones    History of multinodular goiter    s/p  thyroid lobectomy 1967   Hypertension    Irritable bowel syndrome    Left knee DJD    Left ureteral calculus    Nephrolithiasis    bilateral non-obstrucive per CT 03-23-2017   OSA on CPAP    cpap does not know settings    Paroxysmal atrial fibrillation (HCC) newly dx 03-25-2017 (during hospitlization for urosepsis)   A-fib -- Aflutter w/ RVR -- pt started on cardizem and eliquis--- at discharge in [facility]/  cardiologist-  dr Antoine Poche   Past Surgical History:  Procedure Laterality Date   CARPAL TUNNEL RELEASE Bilateral 1989  & 1991    CATARACT EXTRACTION W/ INTRAOCULAR LENS  IMPLANT, BILATERAL  2005  &  2012   CYSTOSCOPY WITH STENT PLACEMENT Left 03/23/2017   Procedure: CYSTOSCOPY, LEFT RETROGRADE WITH STENT PLACEMENT;  Surgeon: Crista Elliot, MD;  Location: [facility];  Service: Urology;  Laterality: Left;   CYSTOSCOPY/URETEROSCOPY/HOLMIUM LASER/STENT PLACEMENT Left 04/10/2017   Procedure:  CYSTOSCOPY/URETEROSCOPY/HOLMIUM LASER/STENT PLACEMENT;  Surgeon: Jerilee Field, MD;  Location: [facility];  Service: Urology;  Laterality: Left;   EXTRACORPOREAL SHOCK WAVE LITHOTRIPSY     MULTIPLE   HEMIARTHROPLASTY RIGHT SHOULDER  2007   HOLMIUM LASER APPLICATION Left 11/23/2012   Procedure: HOLMIUM LASER APPLICATION;  Surgeon: Antony Haste, MD;  Location: [facility];  Service: Urology;  Laterality: Left;   KIDNEY STONE SURGERY  1972 and 1977   removal of stones. (OPEN PROCUDURE)   REVERSE SHOULDER ARTHROPLASTY  10/09/2011   Procedure: REVERSE SHOULDER ARTHROPLASTY;  Surgeon: Senaida Lange, MD;  Location: [facility];  Service: Orthopedics;  Laterality: Right;  right hardware removal and reverse shoulder arthroplasty   THYROID LOBECTOMY  1967   goiter removed   TONSILLECTOMY     TOTAL KNEE ARTHROPLASTY  07/22/2012   Procedure: TOTAL KNEE ARTHROPLASTY;  Surgeon: Javier Docker, MD;  Location: [facility];  Service: Orthopedics;  Laterality: Right;   TOTAL KNEE ARTHROPLASTY Left 12/16/2012   Procedure: LEFT TOTAL KNEE ARTHROPLASTY;  Surgeon: Javier Docker, MD;  Location: [facility];  Service: Orthopedics;  Laterality: Left;   TRANSTHORACIC ECHOCARDIOGRAM  03/25/2017   mild LVH, ef 55-60%/  mild LAE/ trivial PR/  mild MR  TRIGGER FINGER RELEASE Right 2006   URETEROSOPIC STONE EXTRACTION  2002   Patient Active Problem List   Diagnosis Date Noted   Memory impairment 09/11/2022   Mild late onset Alzheimer's dementia without behavioral disturbance, psychotic disturbance, mood disturbance, or anxiety 03/14/2022   Closed fracture of patella 06/06/2019   Pain in right knee 05/12/2019   Degenerative spondylolisthesis 04/08/2018   Degeneration of lumbar intervertebral disc 03/18/2018   Scoliosis of lumbar spine 03/18/2018   Spinal stenosis of lumbar region 03/18/2018   Mild dementia 01/22/2018   History of total knee replacement, right 01/21/2018   Atrial  flutter 04/02/2017   Anticoagulated 04/02/2017   Right carotid bruit 04/02/2017   Sepsis 03/25/2017   Pyelonephritis 03/25/2017   Left ureteral calculus    Ureteral stone 03/23/2017   Nephrolithiasis 03/23/2017   BPH (benign prostatic hypertrophy) 02/03/2013   Extrinsic asthma, unspecified 01/27/2013   Essential hypertension, benign 01/27/2013   Gout, unspecified 01/27/2013   Left knee DJD 12/16/2012   Right knee DJD 07/22/2012    ONSET DATE: 09/11/2022  REFERRING DIAG: R25.1 (ICD-10-CM) - Tremor of right hand  THERAPY DIAG:  Other abnormalities of gait and mobility  Muscle weakness (generalized)  Unsteadiness on feet  Rationale for Evaluation and Treatment: Rehabilitation  SUBJECTIVE:                                                                                                                                                                                             SUBJECTIVE STATEMENT: Pt reports doing "fair". Knee has been bothering him. Denies falls or near misses. HEP is going "slow", may have done them once or twice.   Pt accompanied by: self and significant other [name] (in the car)  PERTINENT HISTORY: history of hypertension, goiter s/p thyroid lobectomy, atrial fibrillation, iron deficiency anemia, with mild dementia, likely due to Alzheimer's disease  PAIN:  Are you having pain? Yes: NPRS scale: 5/10 Pain location: R knee pain Pain description: dull pain Aggravating factors: standing or walking for extended period of time; sit to standing Relieving factors: pain patch  PRECAUTIONS: Fall  WEIGHT BEARING RESTRICTIONS: No  FALLS: Has patient fallen in last 6 months? Yes. Number of falls 3 in the last week; wife has to get help to get him back up  LIVING ENVIRONMENT: Lives with: lives with their family and lives with their spouse Lives in: House/apartment Stairs: No; ramped entrance to one level home Has following equipment at home: Single point  cane, Walker - 2 wheeled, and [employer] - 4 wheeled  PLOF: Independent with gait and Independent with transfers  PATIENT GOALS: "to  walk pretty good without my knee hurting" "the longer I stay on it the worse it gets"  OBJECTIVE:   DIAGNOSTIC FINDINGS:  R knee xray 03/04/2019 after pt's fall: FINDINGS: Status post right knee replacement with normal alignment and intact hardware. No acute displaced fracture. Small knee effusion   IMPRESSION: Status post right knee replacement. No acute osseous abnormality. Small knee effusion  COGNITION: Overall cognitive status: History of cognitive impairments - at baseline (Alzheimer's with mild dementia)   SENSATION: WFL   LOWER EXTREMITY MMT:    MMT Right Eval Left Eval  Hip flexion 5 5  Hip extension    Hip abduction    Hip adduction    Hip internal rotation    Hip external rotation    Knee flexion 4 4  Knee extension 4 5  Ankle dorsiflexion 5 5  Ankle plantarflexion    Ankle inversion    Ankle eversion    (Blank rows = not tested)   TODAY'S TREATMENT:                                                                                                                               Ther Ex Sit <>stands w/10# slam ball throw, x10 reps, for improved anticipatory balance strategies and high amplitude movement. SBA throughout, pt w/single instance of LOB to L side that he was able to recover  Deadlifts using 12# KB, x15 reps, to facilitate proper hip hinge and work on proper lifting technique. Min cues to maintain close distance to KB, as pt tends to place KB too far anteriorly which facilitates anterior lean, stress on low back and anterior LOB Progressed to staggered stance DL, x8 per side, for added balance challenge. Pt w/increased difficulty performing w/LLE forward but no overt LOB noted  Walking 230' w/random posterolateral perturbations to work on reactive balance strategies. Used green resistance band and 2nd therapist for safety. Pt  able to recover balance in posterior direction w/single, large step but pt required several steps to recover in lateral directions. CGA-SBA throughout  Practiced picking up multi-colored pegs and paperclips from ground to work on proper lifting technique, as pt reports this is when he frequently loses his balance at home. Pt required mod cues to remember to maintain close distance to object prior to picking up to prevent anterior lean   While carrying black crate w/10# weight inside, walking around clinic to find random pegs scattered across gym floor to work on proper lifting technique and visual scanning. Pt continued to attempt to pick up items from floor prior to getting close to object, requiring cues to correct.    PATIENT EDUCATION: Education details: Continue HEP, proper lifting technique  Person educated: Patient Education method: Medical illustrator Education comprehension: verbalized understanding, returned demonstration, and needs further education  HOME EXERCISE PROGRAM: Access Code: [email or web address] URL: [email or web address] Date: 09/29/2022 Prepared by: Peter Congo  Exercises - Supine Knee Extension Strengthening  - 1 x  daily - 7 x weekly - 3 sets - 10 reps - 5 sec hold - Active Straight Leg Raise with Quad Set  - 1 x daily - 7 x weekly - 3 sets - 10 reps - Supine Bridge  - 1 x daily - 7 x weekly - 3 sets - 10 reps - 5 sec hold    GOALS: Goals reviewed with patient? Yes  SHORT TERM GOALS: Target date: 10/08/2022  Pt will be independent with initial HEP for improved strength, balance, transfers and gait. Baseline: established HEP and handout provided several times, pt with poor recall of HEP and inconsistently performing at home Goal status: IN PROGRESS  2.  Pt will improve gait velocity to at least 1.75 ft/sec for improved gait efficiency and performance at SBA level  Baseline: 1.64 ft/sec with SBA (3/20), 2.16 ft/sec (4/10) Goal status: MET  3.   Pt will improve normal TUG to less than or equal to 21 seconds for improved functional mobility and decreased fall risk. Baseline: 24.43 sec (3/20), 15.28 sec (4/10) Goal status: MET  4.  Pt will improve Berg score to 36/56 for decreased fall risk Baseline: 33/56 (3/20), 38/56 (4/10) Goal status: MET  5.  Pt will improve 5 x STS to less than or equal to 20 seconds to demonstrate improved functional strength and transfer efficiency.  Baseline: 23.9 sec (3/20), 16.34 sec (4/10) Goal status: MET   LONG TERM GOALS: Target date: 10/29/2022  Pt will be independent with final HEP for improved strength, balance, transfers and gait. Baseline:  Goal status: INITIAL  2.  Pt will improve gait velocity to at least 2.25 ft/sec for improved gait efficiency and performance at mod I level  Baseline: 1.64 ft/sec SBA (3/20), 2.16 ft/sec (4/10) Goal status: REVISED/UPGRADED  3.  Pt will improve normal TUG to less than or equal to 13 seconds for improved functional mobility and decreased fall risk. Baseline: 24.43 sec (3/20), 15.28 sec (4/10) Goal status: REVISED/UPGRADED  4.  Pt will improve Berg score to 39/56 for decreased fall risk Baseline: 33/56 (3/20), 38/56 (4/10) Goal status: INITIAL  5.  Pt will improve score on LEFS to 30/80 to demonstrate decreased disability level Baseline: 26/80 (3/20) Goal status: INITIAL   ASSESSMENT:  CLINICAL IMPRESSION: Emphasis of skilled PT session on proper lifting technique, facilitation of hip hinge and reactive balance strategies. Pt reports he loses his balance the most when reaching for objects on the ground and noted pt reaching too far anteriorly to pick up object, resulting in strain on low back and anterior LOB. Pt required min-mod cues throughout session to remember to maintain close distance to object on floor prior to reaching, but performed well once cued. Continue POC.    OBJECTIVE IMPAIRMENTS: Abnormal gait, decreased balance, decreased  cognition, difficulty walking, decreased strength, and pain.   ACTIVITY LIMITATIONS: standing, squatting, and stairs  PARTICIPATION LIMITATIONS: community activity and yard work  PERSONAL FACTORS: Age, Time since onset of injury/illness/exacerbation, and 3+ comorbidities:    history of  hypertension, goiter s/p thyroid lobectomy, atrial fibrillation, iron deficiency anemia, with mild dementia, likely due to Alzheimer's diseaseare also affecting patient's functional outcome.   REHAB POTENTIAL: Fair time since onset, age  CLINICAL DECISION MAKING: Stable/uncomplicated  EVALUATION COMPLEXITY: Low  PLAN:  PT FREQUENCY: 1-2x/week  PT DURATION: 6 weeks  PLANNED INTERVENTIONS: Therapeutic exercises, Therapeutic activity, Neuromuscular re-education, Balance training, Gait training, Patient/Family education, Self Care, Joint mobilization, Stair training, Orthotic/Fit training, DME instructions, Aquatic Therapy, Dry Needling,  Cognitive remediation, Electrical stimulation, Cryotherapy, Moist heat, Taping, Manual therapy, and Re-evaluation  PLAN FOR NEXT SESSION: add to HEP for RLE strengthening (resisted step-taps, resisted sit to stands), balance; gait across uneven surfaces, work on increasing step length and step height, SLS (more difficult on LLE vs RLE), deadlifts, cleans   Neriah Brott E Chanan Detwiler, PT, DPT 10/14/2022, 1:14 PM

## 2022-10-16 ENCOUNTER — Ambulatory Visit: Payer: Medicare HMO | Admitting: Physical Therapy

## 2022-10-17 ENCOUNTER — Ambulatory Visit: Payer: Medicare HMO | Admitting: Physical Therapy

## 2022-10-17 DIAGNOSIS — R2681 Unsteadiness on feet: Secondary | ICD-10-CM | POA: Diagnosis not present

## 2022-10-17 DIAGNOSIS — G8929 Other chronic pain: Secondary | ICD-10-CM | POA: Diagnosis not present

## 2022-10-17 DIAGNOSIS — M25561 Pain in right knee: Secondary | ICD-10-CM | POA: Diagnosis not present

## 2022-10-17 DIAGNOSIS — M6281 Muscle weakness (generalized): Secondary | ICD-10-CM | POA: Diagnosis not present

## 2022-10-17 DIAGNOSIS — R2689 Other abnormalities of gait and mobility: Secondary | ICD-10-CM | POA: Diagnosis not present

## 2022-10-17 DIAGNOSIS — R296 Repeated falls: Secondary | ICD-10-CM | POA: Diagnosis not present

## 2022-10-17 NOTE — Therapy (Signed)
OUTPATIENT PHYSICAL THERAPY NEURO TREATMENT   Patient Name: Darrell Perez MRN: [id number] DOB:09-Feb-1941, 82 y.o., male Today's Date: 10/17/2022   PCP: Joya Martyr, MD REFERRING PROVIDER: Marcos Eke, PA-C  END OF SESSION:  PT End of Session - 10/17/22 0933     Visit Number 7    Number of Visits 13   with eval   Date for PT Re-Evaluation 11/12/22    Authorization Type [employer]    Progress Note Due on Visit 10    PT Start Time 0932    PT Stop Time 1014    PT Time Calculation (min) 42 min    Equipment Utilized During Treatment Gait belt    Activity Tolerance Patient tolerated treatment well    Behavior During Therapy WFL for tasks assessed/performed                  Past Medical History:  Diagnosis Date   Arthritis    Dementia (HCC)    GERD (gastroesophageal reflux disease)    watches and NO ISSUES WHEN USING CPAP--  NO MEDS   Hiatal hernia    History of asthma    History of kidney stones    History of multinodular goiter    s/p  thyroid lobectomy 1967   Hypertension    Irritable bowel syndrome    Left knee DJD    Left ureteral calculus    Nephrolithiasis    bilateral non-obstrucive per CT 03-23-2017   OSA on CPAP    cpap does not know settings    Paroxysmal atrial fibrillation (HCC) newly dx 03-25-2017 (during hospitlization for urosepsis)   A-fib -- Aflutter w/ RVR -- pt started on cardizem and eliquis--- at discharge in [facility]/  cardiologist-  dr Antoine Poche   Past Surgical History:  Procedure Laterality Date   CARPAL TUNNEL RELEASE Bilateral 1989  & 1991    CATARACT EXTRACTION W/ INTRAOCULAR LENS  IMPLANT, BILATERAL  2005  &  2012   CYSTOSCOPY WITH STENT PLACEMENT Left 03/23/2017   Procedure: CYSTOSCOPY, LEFT RETROGRADE WITH STENT PLACEMENT;  Surgeon: Crista Elliot, MD;  Location: [facility];  Service: Urology;  Laterality: Left;   CYSTOSCOPY/URETEROSCOPY/HOLMIUM LASER/STENT PLACEMENT Left 04/10/2017   Procedure:  CYSTOSCOPY/URETEROSCOPY/HOLMIUM LASER/STENT PLACEMENT;  Surgeon: Jerilee Field, MD;  [facility];  Service: Urology;  Laterality: Left;   EXTRACORPOREAL SHOCK WAVE LITHOTRIPSY     MULTIPLE   HEMIARTHROPLASTY RIGHT SHOULDER  2007   HOLMIUM LASER APPLICATION Left 11/23/2012   Procedure: HOLMIUM LASER APPLICATION;  Surgeon: Antony Haste, MD;  Location: [facility];  Service: Urology;  Laterality: Left;   KIDNEY STONE SURGERY  1972 and 1977   removal of stones. (OPEN PROCUDURE)   REVERSE SHOULDER ARTHROPLASTY  10/09/2011   Procedure: REVERSE SHOULDER ARTHROPLASTY;  Surgeon: Senaida Lange, MD;  Location: [facility];  Service: Orthopedics;  Laterality: Right;  right hardware removal and reverse shoulder arthroplasty   THYROID LOBECTOMY  1967   goiter removed   TONSILLECTOMY     TOTAL KNEE ARTHROPLASTY  07/22/2012   Procedure: TOTAL KNEE ARTHROPLASTY;  Surgeon: Javier Docker, MD;  Location: [facility];  Service: Orthopedics;  Laterality: Right;   TOTAL KNEE ARTHROPLASTY Left 12/16/2012   Procedure: LEFT TOTAL KNEE ARTHROPLASTY;  Surgeon: Javier Docker, MD;  Location: [facility];  Service: Orthopedics;  Laterality: Left;   TRANSTHORACIC ECHOCARDIOGRAM  03/25/2017   mild LVH, ef 55-60%/  mild LAE/ trivial PR/  mild  MR   TRIGGER FINGER RELEASE Right 2006   URETEROSOPIC STONE EXTRACTION  2002   Patient Active Problem List   Diagnosis Date Noted   Memory impairment 09/11/2022   Mild late onset Alzheimer's dementia without behavioral disturbance, psychotic disturbance, mood disturbance, or anxiety 03/14/2022   Closed fracture of patella 06/06/2019   Pain in right knee 05/12/2019   Degenerative spondylolisthesis 04/08/2018   Degeneration of lumbar intervertebral disc 03/18/2018   Scoliosis of lumbar spine 03/18/2018   Spinal stenosis of lumbar region 03/18/2018   Mild dementia 01/22/2018   History of total knee replacement, right 01/21/2018   Atrial  flutter 04/02/2017   Anticoagulated 04/02/2017   Right carotid bruit 04/02/2017   Sepsis 03/25/2017   Pyelonephritis 03/25/2017   Left ureteral calculus    Ureteral stone 03/23/2017   Nephrolithiasis 03/23/2017   BPH (benign prostatic hypertrophy) 02/03/2013   Extrinsic asthma, unspecified 01/27/2013   Essential hypertension, benign 01/27/2013   Gout, unspecified 01/27/2013   Left knee DJD 12/16/2012   Right knee DJD 07/22/2012    ONSET DATE: 09/11/2022  REFERRING DIAG: R25.1 (ICD-10-CM) - Tremor of right hand  THERAPY DIAG:  Other abnormalities of gait and mobility  Muscle weakness (generalized)  Unsteadiness on feet  Rationale for Evaluation and Treatment: Rehabilitation  SUBJECTIVE:                                                                                                                                                                                             SUBJECTIVE STATEMENT: Pt reports doing okay. R knee is more painful today. Exercises are going "slow"   Pt accompanied by: self and significant other [name] (in the car)  PERTINENT HISTORY: history of hypertension, goiter s/p thyroid lobectomy, atrial fibrillation, iron deficiency anemia, with mild dementia, likely due to Alzheimer's disease  PAIN:  Are you having pain? Yes: NPRS scale: 8/10 Pain location: R knee pain Pain description: dull pain Aggravating factors: standing or walking for extended period of time; sit to standing Relieving factors: pain patch  PRECAUTIONS: Fall  WEIGHT BEARING RESTRICTIONS: No  FALLS: Has patient fallen in last 6 months? Yes. Number of falls 3 in the last week; wife has to get help to get him back up  LIVING ENVIRONMENT: Lives with: lives with their family and lives with their spouse Lives in: House/apartment Stairs: No; ramped entrance to one level home Has following equipment at home: Single point cane, Walker - 2 wheeled, and [employer] - 4 wheeled  PLOF:  Independent with gait and Independent with transfers  PATIENT GOALS: "to walk pretty good without my knee hurting" "the  longer I stay on it the worse it gets"  OBJECTIVE:   DIAGNOSTIC FINDINGS:  R knee xray 03/04/2019 after pt's fall: FINDINGS: Status post right knee replacement with normal alignment and intact hardware. No acute displaced fracture. Small knee effusion   IMPRESSION: Status post right knee replacement. No acute osseous abnormality. Small knee effusion  COGNITION: Overall cognitive status: History of cognitive impairments - at baseline (Alzheimer's with mild dementia)   SENSATION: WFL   LOWER EXTREMITY MMT:    MMT Right Eval Left Eval  Hip flexion 5 5  Hip extension    Hip abduction    Hip adduction    Hip internal rotation    Hip external rotation    Knee flexion 4 4  Knee extension 4 5  Ankle dorsiflexion 5 5  Ankle plantarflexion    Ankle inversion    Ankle eversion    (Blank rows = not tested)   TODAY'S TREATMENT:                    Ther Act  Discussed POC moving forward, as pt reporting continued high knee pain levels and does not feel as though PT has been helpful. Pt states his wife is making an appointment with his orthopedic doctor (Dr. Shelle Iron at [facility]) and would like the weekend to think about whether he wants to DC or continue PT. Will decide next session.                                                                                                   Ther Ex SciFit multi-peaks level 7 for 8 minutes using BUE/BLEs for neural priming for reciprocal movement, dynamic cardiovascular warmup and increased amplitude of stepping. RPE of 6/10 and rated pain in R knee as 5/10 following activity  Spanish squats using blue resistance band, x20 reps, for improved quad strength and reduced pressure on R knee. Pt denied increase in knee pain w/activity  Seated march overs using 10# KB, x15 per side, for improved hip/quad strength. Pt w/increased  difficulty performing on LLE, frequently knocking the KB over. No difficulty performing on RLE Standing marches w/red theraband tied around feet, x10 reps per side w/min A and BUE support as pt very unstable w/narrow BOS and moving too quickly. Noted equal hip flexion bilaterally.     PATIENT EDUCATION: Education details: Continue HEP, think about whether he would like to DC next session as he does not feel like PT has been helpful  Person educated: Patient Education method: Explanation and Demonstration Education comprehension: verbalized understanding, returned demonstration, and needs further education  HOME EXERCISE PROGRAM: Access Code: [email or web address] URL: [email or web address] Date: 09/29/2022 Prepared by: Peter Congo  Exercises - Supine Knee Extension Strengthening  - 1 x daily - 7 x weekly - 3 sets - 10 reps - 5 sec hold - Active Straight Leg Raise with Quad Set  - 1 x daily - 7 x weekly - 3 sets - 10 reps - Supine Bridge  - 1 x daily - 7 x weekly - 3 sets - 10 reps - 5  sec hold    GOALS: Goals reviewed with patient? Yes  SHORT TERM GOALS: Target date: 10/08/2022  Pt will be independent with initial HEP for improved strength, balance, transfers and gait. Baseline: established HEP and handout provided several times, pt with poor recall of HEP and inconsistently performing at home Goal status: IN PROGRESS  2.  Pt will improve gait velocity to at least 1.75 ft/sec for improved gait efficiency and performance at SBA level  Baseline: 1.64 ft/sec with SBA (3/20), 2.16 ft/sec (4/10) Goal status: MET  3.  Pt will improve normal TUG to less than or equal to 21 seconds for improved functional mobility and decreased fall risk. Baseline: 24.43 sec (3/20), 15.28 sec (4/10) Goal status: MET  4.  Pt will improve Berg score to 36/56 for decreased fall risk Baseline: 33/56 (3/20), 38/56 (4/10) Goal status: MET  5.  Pt will improve 5 x STS to less than or equal to 20  seconds to demonstrate improved functional strength and transfer efficiency.  Baseline: 23.9 sec (3/20), 16.34 sec (4/10) Goal status: MET   LONG TERM GOALS: Target date: 10/29/2022  Pt will be independent with final HEP for improved strength, balance, transfers and gait. Baseline:  Goal status: INITIAL  2.  Pt will improve gait velocity to at least 2.25 ft/sec for improved gait efficiency and performance at mod I level  Baseline: 1.64 ft/sec SBA (3/20), 2.16 ft/sec (4/10) Goal status: REVISED/UPGRADED  3.  Pt will improve normal TUG to less than or equal to 13 seconds for improved functional mobility and decreased fall risk. Baseline: 24.43 sec (3/20), 15.28 sec (4/10) Goal status: REVISED/UPGRADED  4.  Pt will improve Berg score to 39/56 for decreased fall risk Baseline: 33/56 (3/20), 38/56 (4/10) Goal status: INITIAL  5.  Pt will improve score on LEFS to 30/80 to demonstrate decreased disability level Baseline: 26/80 (3/20) Goal status: INITIAL   ASSESSMENT:  CLINICAL IMPRESSION: Emphasis of skilled PT session on BLE strength and discussing POC moving forward. Pt initially reporting high levels of R knee pain and stating he does not feel benefit from PT. However, throughout session, pt inconsistent with pain reporting and stated that PT is helpful. Pt reports his wife is making an appointment with Dr. Shelle Iron to follow up on the R knee. Pt has continued decreased step clearance of LLE, likely due to increased edema on L side. Pt most limited by R knee pain which PT seems to have a fluctuating impact on. Continue POC.    OBJECTIVE IMPAIRMENTS: Abnormal gait, decreased balance, decreased cognition, difficulty walking, decreased strength, and pain.   ACTIVITY LIMITATIONS: standing, squatting, and stairs  PARTICIPATION LIMITATIONS: community activity and yard work  PERSONAL FACTORS: Age, Time since onset of injury/illness/exacerbation, and 3+ comorbidities:    history of   hypertension, goiter s/p thyroid lobectomy, atrial fibrillation, iron deficiency anemia, with mild dementia, likely due to Alzheimer's diseaseare also affecting patient's functional outcome.   REHAB POTENTIAL: Fair time since onset, age  CLINICAL DECISION MAKING: Stable/uncomplicated  EVALUATION COMPLEXITY: Low  PLAN:  PT FREQUENCY: 1-2x/week  PT DURATION: 6 weeks  PLANNED INTERVENTIONS: Therapeutic exercises, Therapeutic activity, Neuromuscular re-education, Balance training, Gait training, Patient/Family education, Self Care, Joint mobilization, Stair training, Orthotic/Fit training, DME instructions, Aquatic Therapy, Dry Needling, Cognitive remediation, Electrical stimulation, Cryotherapy, Moist heat, Taping, Manual therapy, and Re-evaluation  PLAN FOR NEXT SESSION: Does pt want to DC? add to HEP for RLE strengthening (resisted step-taps, resisted sit to stands), balance; gait across uneven surfaces,  work on increasing step length and step height, SLS (more difficult on LLE vs RLE), deadlifts, cleans, stepping strategies.    Jill Alexanders Auri Jahnke, PT, DPT 10/17/2022, 10:15 AM

## 2022-10-21 ENCOUNTER — Ambulatory Visit: Payer: Medicare HMO | Admitting: Physical Therapy

## 2022-10-21 DIAGNOSIS — R2681 Unsteadiness on feet: Secondary | ICD-10-CM

## 2022-10-21 DIAGNOSIS — R296 Repeated falls: Secondary | ICD-10-CM | POA: Diagnosis not present

## 2022-10-21 DIAGNOSIS — M6281 Muscle weakness (generalized): Secondary | ICD-10-CM | POA: Diagnosis not present

## 2022-10-21 DIAGNOSIS — G8929 Other chronic pain: Secondary | ICD-10-CM | POA: Diagnosis not present

## 2022-10-21 DIAGNOSIS — R2689 Other abnormalities of gait and mobility: Secondary | ICD-10-CM

## 2022-10-21 DIAGNOSIS — M25561 Pain in right knee: Secondary | ICD-10-CM | POA: Diagnosis not present

## 2022-10-21 NOTE — Therapy (Signed)
OUTPATIENT PHYSICAL THERAPY NEURO TREATMENT   Patient Name: Darrell Perez MRN: [id number] DOB:[birth year], 82 y.o., male Today's Date: 10/21/2022   PCP: Joya Martyr, MD REFERRING PROVIDER: Marcos Eke, PA-C  END OF SESSION:  PT End of Session - 10/21/22 1020     Visit Number 8    Number of Visits 13   with eval   Date for PT Re-Evaluation 11/12/22    Authorization Type [employer]    Progress Note Due on Visit 10    PT Start Time 1018    PT Stop Time 1059    PT Time Calculation (min) 41 min    Equipment Utilized During Treatment Gait belt    Activity Tolerance Patient tolerated treatment well    Behavior During Therapy WFL for tasks assessed/performed                   Past Medical History:  Diagnosis Date   Arthritis    Dementia (HCC)    GERD (gastroesophageal reflux disease)    watches and NO ISSUES WHEN USING CPAP--  NO MEDS   Hiatal hernia    History of asthma    History of kidney stones    History of multinodular goiter    s/p  thyroid lobectomy 1967   Hypertension    Irritable bowel syndrome    Left knee DJD    Left ureteral calculus    Nephrolithiasis    bilateral non-obstrucive per CT 03-23-2017   OSA on CPAP    cpap does not know settings    Paroxysmal atrial fibrillation (HCC) newly dx 03-25-2017 (during hospitlization for urosepsis)   A-fib -- Aflutter w/ RVR -- pt started on cardizem and eliquis--- at discharge in [facility]/  cardiologist-  dr Antoine Poche   Past Surgical History:  Procedure Laterality Date   CARPAL TUNNEL RELEASE Bilateral 1989  & 1991    CATARACT EXTRACTION W/ INTRAOCULAR LENS  IMPLANT, BILATERAL  2005  &  2012   CYSTOSCOPY WITH STENT PLACEMENT Left 03/23/2017   Procedure: CYSTOSCOPY, LEFT RETROGRADE WITH STENT PLACEMENT;  Surgeon: Crista Elliot, MD;  Location: [facility];  Service: Urology;  Laterality: Left;   CYSTOSCOPY/URETEROSCOPY/HOLMIUM LASER/STENT PLACEMENT Left 04/10/2017   Procedure:  CYSTOSCOPY/URETEROSCOPY/HOLMIUM LASER/STENT PLACEMENT;  Surgeon: Jerilee Field, MD;  Location: [facility];  Service: Urology;  Laterality: Left;   EXTRACORPOREAL SHOCK WAVE LITHOTRIPSY     MULTIPLE   HEMIARTHROPLASTY RIGHT SHOULDER  2007   HOLMIUM LASER APPLICATION Left 11/23/2012   Procedure: HOLMIUM LASER APPLICATION;  Surgeon: Antony Haste, MD;  Location: [facility];  Service: Urology;  Laterality: Left;   KIDNEY STONE SURGERY  1972 and 1977   removal of stones. (OPEN PROCUDURE)   REVERSE SHOULDER ARTHROPLASTY  10/09/2011   Procedure: REVERSE SHOULDER ARTHROPLASTY;  Surgeon: Senaida Lange, MD;  Location: [facility];  Service: Orthopedics;  Laterality: Right;  right hardware removal and reverse shoulder arthroplasty   THYROID LOBECTOMY  1967   goiter removed   TONSILLECTOMY     TOTAL KNEE ARTHROPLASTY  07/22/2012   Procedure: TOTAL KNEE ARTHROPLASTY;  Surgeon: Javier Docker, MD;  Location: [facility];  Service: Orthopedics;  Laterality: Right;   TOTAL KNEE ARTHROPLASTY Left 12/16/2012   Procedure: LEFT TOTAL KNEE ARTHROPLASTY;  Surgeon: Javier Docker, MD;  Location: [facility];  Service: Orthopedics;  Laterality: Left;   TRANSTHORACIC ECHOCARDIOGRAM  03/25/2017   mild LVH, ef 55-60%/  mild LAE/ trivial PR/  mild MR   TRIGGER FINGER RELEASE Right 2006   URETEROSOPIC STONE EXTRACTION  2002   Patient Active Problem List   Diagnosis Date Noted   Memory impairment 09/11/2022   Mild late onset Alzheimer's dementia without behavioral disturbance, psychotic disturbance, mood disturbance, or anxiety 03/14/2022   Closed fracture of patella 06/06/2019   Pain in right knee 05/12/2019   Degenerative spondylolisthesis 04/08/2018   Degeneration of lumbar intervertebral disc 03/18/2018   Scoliosis of lumbar spine 03/18/2018   Spinal stenosis of lumbar region 03/18/2018   Mild dementia 01/22/2018   History of total knee replacement, right 01/21/2018   Atrial  flutter 04/02/2017   Anticoagulated 04/02/2017   Right carotid bruit 04/02/2017   Sepsis 03/25/2017   Pyelonephritis 03/25/2017   Left ureteral calculus    Ureteral stone 03/23/2017   Nephrolithiasis 03/23/2017   BPH (benign prostatic hypertrophy) 02/03/2013   Extrinsic asthma, unspecified 01/27/2013   Essential hypertension, benign 01/27/2013   Gout, unspecified 01/27/2013   Left knee DJD 12/16/2012   Right knee DJD 07/22/2012    ONSET DATE: 09/11/2022  REFERRING DIAG: R25.1 (ICD-10-CM) - Tremor of right hand  THERAPY DIAG:  Other abnormalities of gait and mobility  Muscle weakness (generalized)  Unsteadiness on feet  Chronic pain of right knee  Rationale for Evaluation and Treatment: Rehabilitation  SUBJECTIVE:                                                                                                                                                                                             SUBJECTIVE STATEMENT: Pt reports doing okay. Still having knee pain. "It seems to get worse with activity". Pt reports noncompliance w/HEP and wife states pt's balance is not good.   Pt accompanied by: self and significant other [name]   PERTINENT HISTORY: history of hypertension, goiter s/p thyroid lobectomy, atrial fibrillation, iron deficiency anemia, with mild dementia, likely due to Alzheimer's disease  PAIN:  Are you having pain? Yes: NPRS scale: 2-3/10 Pain location: R knee pain Pain description: dull pain Aggravating factors: standing or walking for extended period of time; sit to standing Relieving factors: pain patch  PRECAUTIONS: Fall  WEIGHT BEARING RESTRICTIONS: No  FALLS: Has patient fallen in last 6 months? Yes. Number of falls 3 in the last week; wife has to get help to get him back up  LIVING ENVIRONMENT: Lives with: lives with their family and lives with their spouse Lives in: House/apartment Stairs: No; ramped entrance to one level home Has  following equipment at home: Single point cane, Walker - 2 wheeled, and [employer] - 4 wheeled  PLOF: Independent  with gait and Independent with transfers  PATIENT GOALS: "to walk pretty good without my knee hurting" "the longer I stay on it the worse it gets"  OBJECTIVE:   DIAGNOSTIC FINDINGS:  R knee xray 03/04/2019 after pt's fall: FINDINGS: Status post right knee replacement with normal alignment and intact hardware. No acute displaced fracture. Small knee effusion   IMPRESSION: Status post right knee replacement. No acute osseous abnormality. Small knee effusion  COGNITION: Overall cognitive status: History of cognitive impairments - at baseline (Alzheimer's with mild dementia)   SENSATION: WFL   LOWER EXTREMITY MMT:    MMT Right Eval Left Eval  Hip flexion 5 5  Hip extension    Hip abduction    Hip adduction    Hip internal rotation    Hip external rotation    Knee flexion 4 4  Knee extension 4 5  Ankle dorsiflexion 5 5  Ankle plantarflexion    Ankle inversion    Ankle eversion    (Blank rows = not tested)   TODAY'S TREATMENT:                    Ther Act  Educated pt and wife on the 3 balance systems and how cognition impacts balance as well. Pt has been noncompliant w/HEP as he reports the exercises hurt his knee, but after each PT session, pt reports improvement in knee pain. At this time, suspect pt's imbalance is closely related to deconditioning due to pt's decreased activity. Established a walking program for pt and wife to perform together, starting at 10 minute intervals 2-3x/day. Pt and wife verbalized understanding   NMR  Ambulating 250' fwd around track w/random posterolateral perturbations using blue resistance band for improved reactive balance strategies and endurance. Performed w/therapist and tech. Pt able to elicit good stepping strategy bilaterally w/no instability noted. Pt denied knee pain w/activity, SBA throughout.  Progressed to retro gait  w/random posterior perturbation w/quick set-switch to anterior gait, x12 reps. Pt required mod verbal cues for proper performance of task, as pt not switching to anterior gait w/perturbation initially. Pt did well w/posterior stepping strategies w/no instability noted. SBA throughout.                                                                                                    PATIENT EDUCATION: Education details: See ther Act section  Person educated: Patient Education method: Explanation and Demonstration Education comprehension: verbalized understanding, returned demonstration, and needs further education  HOME EXERCISE PROGRAM: Access Code: [email or web address] URL: [email or web address] Date: 10/21/2022 Prepared by: Alethia Berthold Francee Setzer  Program Notes You Can Walk For A Certain Length Of Time Each Day          Walk 10 minutes 2-3 times per day.                    Work up to 20 minutes (1-2 times per day).   Exercises - Supine Knee Extension Strengthening  - 1 x daily - 7 x weekly - 3 sets - 10 reps - 5 sec hold - Active Straight  Leg Raise with Quad Set  - 1 x daily - 7 x weekly - 3 sets - 10 reps - Supine Bridge  - 1 x daily - 7 x weekly - 3 sets - 10 reps - 5 sec hold                 GOALS: Goals reviewed with patient? Yes  SHORT TERM GOALS: Target date: 10/08/2022  Pt will be independent with initial HEP for improved strength, balance, transfers and gait. Baseline: established HEP and handout provided several times, pt with poor recall of HEP and inconsistently performing at home Goal status: IN PROGRESS  2.  Pt will improve gait velocity to at least 1.75 ft/sec for improved gait efficiency and performance at SBA level  Baseline: 1.64 ft/sec with SBA (3/20), 2.16 ft/sec (4/10) Goal status: MET  3.  Pt will improve normal TUG to less than or equal to 21 seconds for improved functional mobility and decreased fall risk. Baseline: 24.43 sec (3/20), 15.28 sec (4/10) Goal  status: MET  4.  Pt will improve Berg score to 36/56 for decreased fall risk Baseline: 33/56 (3/20), 38/56 (4/10) Goal status: MET  5.  Pt will improve 5 x STS to less than or equal to 20 seconds to demonstrate improved functional strength and transfer efficiency.  Baseline: 23.9 sec (3/20), 16.34 sec (4/10) Goal status: MET   LONG TERM GOALS: Target date: 10/29/2022  Pt will be independent with final HEP for improved strength, balance, transfers and gait. Baseline:  Goal status: INITIAL  2.  Pt will improve gait velocity to at least 2.25 ft/sec for improved gait efficiency and performance at mod I level  Baseline: 1.64 ft/sec SBA (3/20), 2.16 ft/sec (4/10) Goal status: REVISED/UPGRADED  3.  Pt will improve normal TUG to less than or equal to 13 seconds for improved functional mobility and decreased fall risk. Baseline: 24.43 sec (3/20), 15.28 sec (4/10) Goal status: REVISED/UPGRADED  4.  Pt will improve Berg score to 39/56 for decreased fall risk Baseline: 33/56 (3/20), 38/56 (4/10) Goal status: INITIAL  5.  Pt will improve score on LEFS to 30/80 to demonstrate decreased disability level Baseline: 26/80 (3/20) Goal status: INITIAL   ASSESSMENT:  CLINICAL IMPRESSION: Emphasis of skilled PT session on pt education regarding adherence to exercise program and facilitation of stepping strategies. Pt reports noncompliance w/HEP due to causing knee pain, but also reports exercise helps his knee. At this time, regressed HEP to walking program only to work on improved cardiovascular endurance and stamina as well as global strengthening. Pt able to elicit good stepping strategies during session and therapist unable to recreate pt's "instability" in clinic as it occurs at home per wife. Suspect a cognitive component. Continue POC.    OBJECTIVE IMPAIRMENTS: Abnormal gait, decreased balance, decreased cognition, difficulty walking, decreased strength, and pain.   ACTIVITY LIMITATIONS:  standing, squatting, and stairs  PARTICIPATION LIMITATIONS: community activity and yard work  PERSONAL FACTORS: Age, Time since onset of injury/illness/exacerbation, and 3+ comorbidities:    history of  hypertension, goiter s/p thyroid lobectomy, atrial fibrillation, iron deficiency anemia, with mild dementia, likely due to Alzheimer's diseaseare also affecting patient's functional outcome.   REHAB POTENTIAL: Fair time since onset, age  CLINICAL DECISION MAKING: Stable/uncomplicated  EVALUATION COMPLEXITY: Low  PLAN:  PT FREQUENCY: 1-2x/week  PT DURATION: 6 weeks  PLANNED INTERVENTIONS: Therapeutic exercises, Therapeutic activity, Neuromuscular re-education, Balance training, Gait training, Patient/Family education, Self Care, Joint mobilization, Stair training, Orthotic/Fit training, DME instructions,  Aquatic Therapy, Dry Needling, Cognitive remediation, Electrical stimulation, Cryotherapy, Moist heat, Taping, Manual therapy, and Re-evaluation  PLAN FOR NEXT SESSION: Walking program? MCTSIB. add to HEP for RLE strengthening (resisted step-taps, resisted sit to stands), balance; gait across uneven surfaces, work on increasing step length and step height, SLS (more difficult on LLE vs RLE), deadlifts, cleans   Deitrich Steve E Larrie Fraizer, PT, DPT 10/21/2022, 11:00 AM

## 2022-10-23 ENCOUNTER — Encounter: Payer: Self-pay | Admitting: Physical Therapy

## 2022-10-23 ENCOUNTER — Ambulatory Visit: Payer: Medicare HMO | Admitting: Physical Therapy

## 2022-10-23 DIAGNOSIS — R2681 Unsteadiness on feet: Secondary | ICD-10-CM | POA: Diagnosis not present

## 2022-10-23 DIAGNOSIS — M6281 Muscle weakness (generalized): Secondary | ICD-10-CM

## 2022-10-23 DIAGNOSIS — R2689 Other abnormalities of gait and mobility: Secondary | ICD-10-CM | POA: Diagnosis not present

## 2022-10-23 DIAGNOSIS — R296 Repeated falls: Secondary | ICD-10-CM | POA: Diagnosis not present

## 2022-10-23 DIAGNOSIS — G8929 Other chronic pain: Secondary | ICD-10-CM | POA: Diagnosis not present

## 2022-10-23 DIAGNOSIS — M25561 Pain in right knee: Secondary | ICD-10-CM | POA: Diagnosis not present

## 2022-10-23 NOTE — Therapy (Signed)
OUTPATIENT PHYSICAL THERAPY NEURO TREATMENT   Patient Name: BODI PALMERI MRN: [id number] DOB:[birth year], 82 y.o., male Today's Date: 10/23/2022   PCP: Joya Martyr, MD REFERRING PROVIDER: Marcos Eke, PA-C  END OF SESSION:  PT End of Session - 10/23/22 1237     Visit Number 9    Number of Visits 13   with eval   Date for PT Re-Evaluation 11/12/22    Authorization Type [employer]    Progress Note Due on Visit 10    PT Start Time 1017    PT Stop Time 1101    PT Time Calculation (min) 44 min    Equipment Utilized During Treatment Gait belt;Other (comment)   rollator   Activity Tolerance Patient tolerated treatment well    Behavior During Therapy WFL for tasks assessed/performed                    Past Medical History:  Diagnosis Date   Arthritis    Dementia    GERD (gastroesophageal reflux disease)    watches and NO ISSUES WHEN USING CPAP--  NO MEDS   Hiatal hernia    History of asthma    History of kidney stones    History of multinodular goiter    s/p  thyroid lobectomy 1967   Hypertension    Irritable bowel syndrome    Left knee DJD    Left ureteral calculus    Nephrolithiasis    bilateral non-obstrucive per CT 03-23-2017   OSA on CPAP    cpap does not know settings    Paroxysmal atrial fibrillation newly dx 03-25-2017 (during hospitlization for urosepsis)   A-fib -- Aflutter w/ RVR -- pt started on cardizem and eliquis--- at discharge in [facility]/  cardiologist-  dr hochrein   Past Surgical History:  Procedure Laterality Date   CARPAL TUNNEL RELEASE Bilateral 1989  & 1991    CATARACT EXTRACTION W/ INTRAOCULAR LENS  IMPLANT, BILATERAL  2005  &  2012   CYSTOSCOPY WITH STENT PLACEMENT Left 03/23/2017   Procedure: CYSTOSCOPY, LEFT RETROGRADE WITH STENT PLACEMENT;  Surgeon: Crista Elliot, MD;  Location: [facility];  Service: Urology;  Laterality: Left;   CYSTOSCOPY/URETEROSCOPY/HOLMIUM LASER/STENT PLACEMENT Left 04/10/2017   Procedure:  CYSTOSCOPY/URETEROSCOPY/HOLMIUM LASER/STENT PLACEMENT;  Surgeon: Jerilee Field, MD;  Location: [facility];  Service: Urology;  Laterality: Left;   EXTRACORPOREAL SHOCK WAVE LITHOTRIPSY     MULTIPLE   HEMIARTHROPLASTY RIGHT SHOULDER  2007   HOLMIUM LASER APPLICATION Left 11/23/2012   Procedure: HOLMIUM LASER APPLICATION;  Surgeon: Antony Haste, MD;  Location: [facility];  Service: Urology;  Laterality: Left;   KIDNEY STONE SURGERY  1972 and 1977   removal of stones. (OPEN PROCUDURE)   REVERSE SHOULDER ARTHROPLASTY  10/09/2011   Procedure: REVERSE SHOULDER ARTHROPLASTY;  Surgeon: Senaida Lange, MD;  Location: [facility];  Service: Orthopedics;  Laterality: Right;  right hardware removal and reverse shoulder arthroplasty   THYROID LOBECTOMY  1967   goiter removed   TONSILLECTOMY     TOTAL KNEE ARTHROPLASTY  07/22/2012   Procedure: TOTAL KNEE ARTHROPLASTY;  Surgeon: Javier Docker, MD;  Location: [facility];  Service: Orthopedics;  Laterality: Right;   TOTAL KNEE ARTHROPLASTY Left 12/16/2012   Procedure: LEFT TOTAL KNEE ARTHROPLASTY;  Surgeon: Javier Docker, MD;  Location: [facility];  Service: Orthopedics;  Laterality: Left;   TRANSTHORACIC ECHOCARDIOGRAM  03/25/2017   mild LVH, ef 55-60%/  mild LAE/ trivial  PR/  mild MR   TRIGGER FINGER RELEASE Right 2006   URETEROSOPIC STONE EXTRACTION  2002   Patient Active Problem List   Diagnosis Date Noted   Memory impairment 09/11/2022   Mild late onset Alzheimer's dementia without behavioral disturbance, psychotic disturbance, mood disturbance, or anxiety 03/14/2022   Closed fracture of patella 06/06/2019   Pain in right knee 05/12/2019   Degenerative spondylolisthesis 04/08/2018   Degeneration of lumbar intervertebral disc 03/18/2018   Scoliosis of lumbar spine 03/18/2018   Spinal stenosis of lumbar region 03/18/2018   Mild dementia 01/22/2018   History of total knee replacement, right 01/21/2018   Atrial  flutter 04/02/2017   Anticoagulated 04/02/2017   Right carotid bruit 04/02/2017   Sepsis 03/25/2017   Pyelonephritis 03/25/2017   Left ureteral calculus    Ureteral stone 03/23/2017   Nephrolithiasis 03/23/2017   BPH (benign prostatic hypertrophy) 02/03/2013   Extrinsic asthma, unspecified 01/27/2013   Essential hypertension, benign 01/27/2013   Gout, unspecified 01/27/2013   Left knee DJD 12/16/2012   Right knee DJD 07/22/2012    ONSET DATE: 09/11/2022  REFERRING DIAG: R25.1 (ICD-10-CM) - Tremor of right hand  THERAPY DIAG:  Other abnormalities of gait and mobility  Muscle weakness (generalized)  Unsteadiness on feet  Rationale for Evaluation and Treatment: Rehabilitation  SUBJECTIVE:                                                                                                                                                                                             SUBJECTIVE STATEMENT: Wife accompanying pt to PT today - she reports pt had a fall on Tuesday outside when he was doing his walking program.  She states she was with him but says he started going faster and then was unable to slow himself down (gained momentum in walking) and fell on the pavement. Says neighbor saw and came and helped her get him up off the ground.  She states he skinned his left leg when he fell on the pavement; no other injuries reported - pt reports no pain at this time  Pt accompanied by: self and significant other [name]   PERTINENT HISTORY: history of hypertension, goiter s/p thyroid lobectomy, atrial fibrillation, iron deficiency anemia, with mild dementia, likely due to Alzheimer's disease  PAIN:  Are you having pain? Yes: NPRS scale: 2-3/10 Pain location: R knee pain Pain description: dull pain Aggravating factors: standing or walking for extended period of time; sit to standing Relieving factors: pain patch  PRECAUTIONS: Fall  WEIGHT BEARING RESTRICTIONS: No  FALLS: Has  patient fallen in last 6 months? Yes. Number of  falls 3 in the last week; wife has to get help to get him back up  LIVING ENVIRONMENT: Lives with: lives with their family and lives with their spouse Lives in: House/apartment Stairs: No; ramped entrance to one level home Has following equipment at home: Single point cane, Environmental consultant - 2 wheeled, and Environmental consultant - 4 wheeled  PLOF: Independent with gait and Independent with transfers  PATIENT GOALS: "to walk pretty good without my knee hurting" "the longer I stay on it the worse it gets"  OBJECTIVE:   DIAGNOSTIC FINDINGS:  R knee xray 03/04/2019 after pt's fall: FINDINGS: Status post right knee replacement with normal alignment and intact hardware. No acute displaced fracture. Small knee effusion   IMPRESSION: Status post right knee replacement. No acute osseous abnormality. Small knee effusion  COGNITION: Overall cognitive status: History of cognitive impairments - at baseline (Alzheimer's with mild dementia)   SENSATION: WFL   LOWER EXTREMITY MMT:    MMT Right Eval Left Eval  Hip flexion 5 5  Hip extension    Hip abduction    Hip adduction    Hip internal rotation    Hip external rotation    Knee flexion 4 4  Knee extension 4 5  Ankle dorsiflexion 5 5  Ankle plantarflexion    Ankle inversion    Ankle eversion    (Blank rows = not tested)   TODAY'S TREATMENT:                    Gait: Rollator used - pt gait trained outside on pavement in parking lot - approx. 300' from back door of clinic, down sidewalk to left side and then down inclined parking lot on right side;  CGA with cues for increased step length; pt instructed in how to ascend and descend curb with rollator - without lifting it - bumping wheels up/down on and off curb  Pt needed verbal cues for correct hand placement with sit to stand transfers with use of rollator  NMR  Single limb stance activities - touching balance bubbles (3) with each foot with UE support  prn on counter with CGA Sit to stand 5 reps from mat without UE support - feet on floor Marching on floor 10 reps with CGA without UE support Tap ups to 1st step 5 reps each leg; to 2nd step 5 reps each leg with CGA to min assist Rockerboard inside // bars - 10 reps x 2 sets anterior/posteriorly with gradual decrease in UE support  TherEx; Instructed in Lt dorsiflexor strengthening with red theraband for increased eccentric strength of Lt dorsiflexors to reduce foot slap in stance phase of gait - pt given red theraband  Medbridge Access Code: [email or web address] URL: [email or web address] Date: 10/23/2022 Prepared by: Maebelle Munroe  Exercises - Seated Ankle Dorsiflexion with Resistance  - 1 x daily - 7 x weekly - 2 sets - 10 reps - 3-5 sec hold                                                                                                  PATIENT EDUCATION:  Education details: See ther Act section  Person educated: Patient Education method: Explanation and Demonstration Education comprehension: verbalized understanding, returned demonstration, and needs further education  HOME EXERCISE PROGRAM: Access Code: [email or web address] URL: [email or web address] Date: 10/21/2022 Prepared by: Alethia Berthold Plaster  Program Notes You Can Walk For A Certain Length Of Time Each Day          Walk 10 minutes 2-3 times per day.                    Work up to 20 minutes (1-2 times per day).   Exercises - Supine Knee Extension Strengthening  - 1 x daily - 7 x weekly - 3 sets - 10 reps - 5 sec hold - Active Straight Leg Raise with Quad Set  - 1 x daily - 7 x weekly - 3 sets - 10 reps - Supine Bridge  - 1 x daily - 7 x weekly - 3 sets - 10 reps - 5 sec hold                 GOALS: Goals reviewed with patient? Yes  SHORT TERM GOALS: Target date: 10/08/2022  Pt will be independent with initial HEP for improved strength, balance, transfers and gait. Baseline: established HEP and handout provided  several times, pt with poor recall of HEP and inconsistently performing at home Goal status: IN PROGRESS  2.  Pt will improve gait velocity to at least 1.75 ft/sec for improved gait efficiency and performance at SBA level  Baseline: 1.64 ft/sec with SBA (3/20), 2.16 ft/sec (4/10) Goal status: MET  3.  Pt will improve normal TUG to less than or equal to 21 seconds for improved functional mobility and decreased fall risk. Baseline: 24.43 sec (3/20), 15.28 sec (4/10) Goal status: MET  4.  Pt will improve Berg score to 36/56 for decreased fall risk Baseline: 33/56 (3/20), 38/56 (4/10) Goal status: MET  5.  Pt will improve 5 x STS to less than or equal to 20 seconds to demonstrate improved functional strength and transfer efficiency.  Baseline: 23.9 sec (3/20), 16.34 sec (4/10) Goal status: MET   LONG TERM GOALS: Target date: 10/29/2022  Pt will be independent with final HEP for improved strength, balance, transfers and gait. Baseline:  Goal status: INITIAL  2.  Pt will improve gait velocity to at least 2.25 ft/sec for improved gait efficiency and performance at mod I level  Baseline: 1.64 ft/sec SBA (3/20), 2.16 ft/sec (4/10) Goal status: REVISED/UPGRADED  3.  Pt will improve normal TUG to less than or equal to 13 seconds for improved functional mobility and decreased fall risk. Baseline: 24.43 sec (3/20), 15.28 sec (4/10) Goal status: REVISED/UPGRADED  4.  Pt will improve Berg score to 39/56 for decreased fall risk Baseline: 33/56 (3/20), 38/56 (4/10) Goal status: INITIAL  5.  Pt will improve score on LEFS to 30/80 to demonstrate decreased disability level Baseline: 26/80 (3/20) Goal status: INITIAL   ASSESSMENT:  CLINICAL IMPRESSION: PT session focused on gait training with use of rollator for safety with ambulation on outdoor surfaces due to pt sustaining a fall while performing his walking program at home with use of cane with wife's supervision.  Pt able to use rollator  very well with good control and demonstrated stability with ambulation on paved incline and decline in parking lot at clinic.  Rollator recommended for use for safety - pt and wife agree with this recommendation.  Pt demonstrates slight shuffling gait with decreased Lt  foot clearance compared to Rt foot and also has mild foot slap LLE in stance due to decreased eccentric dorsiflexor strength.  Pt demonstrates parkinsons-like symptoms during gait.  Continue POC.    OBJECTIVE IMPAIRMENTS: Abnormal gait, decreased balance, decreased cognition, difficulty walking, decreased strength, and pain.   ACTIVITY LIMITATIONS: standing, squatting, and stairs  PARTICIPATION LIMITATIONS: community activity and yard work  PERSONAL FACTORS: Age, Time since onset of injury/illness/exacerbation, and 3+ comorbidities:    history of  hypertension, goiter s/p thyroid lobectomy, atrial fibrillation, iron deficiency anemia, with mild dementia, likely due to Alzheimer's diseaseare also affecting patient's functional outcome.   REHAB POTENTIAL: Fair time since onset, age  CLINICAL DECISION MAKING: Stable/uncomplicated  EVALUATION COMPLEXITY: Low  PLAN:  PT FREQUENCY: 1-2x/week  PT DURATION: 6 weeks  PLANNED INTERVENTIONS: Therapeutic exercises, Therapeutic activity, Neuromuscular re-education, Balance training, Gait training, Patient/Family education, Self Care, Joint mobilization, Stair training, Orthotic/Fit training, DME instructions, Aquatic Therapy, Dry Needling, Cognitive remediation, Electrical stimulation, Cryotherapy, Moist heat, Taping, Manual therapy, and Re-evaluation  PLAN FOR NEXT SESSION: 10th visit progress note due next session; Walking program? MCTSIB. add to HEP for RLE strengthening (resisted step-taps, resisted sit to stands), balance; gait across uneven surfaces, work on increasing step length and step height, SLS (more difficult on LLE vs RLE), deadlifts, cleans   Cornell Gaber, Donavan Burnet,  PT 10/23/2022, 12:42 PM

## 2022-10-27 ENCOUNTER — Ambulatory Visit: Payer: Medicare HMO | Admitting: Physical Therapy

## 2022-10-29 ENCOUNTER — Ambulatory Visit: Payer: Medicare HMO | Admitting: Physical Therapy

## 2022-11-02 ENCOUNTER — Other Ambulatory Visit: Payer: Self-pay | Admitting: Physician Assistant

## 2022-11-05 ENCOUNTER — Ambulatory Visit: Payer: Medicare HMO | Attending: Physician Assistant | Admitting: Physical Therapy

## 2022-11-05 DIAGNOSIS — M25561 Pain in right knee: Secondary | ICD-10-CM | POA: Insufficient documentation

## 2022-11-05 DIAGNOSIS — R2681 Unsteadiness on feet: Secondary | ICD-10-CM | POA: Insufficient documentation

## 2022-11-05 DIAGNOSIS — R2689 Other abnormalities of gait and mobility: Secondary | ICD-10-CM

## 2022-11-05 DIAGNOSIS — G8929 Other chronic pain: Secondary | ICD-10-CM | POA: Diagnosis not present

## 2022-11-05 NOTE — Therapy (Addendum)
OUTPATIENT PHYSICAL THERAPY NEURO TREATMENT- 10TH VISIT PROGRESS NOTE AND DISCHARGE SUMMARY   Patient Name: Darrell Perez MRN: [id number] DOB:1941-01-03, 82 y.o., male Today's Date: 11/05/2022   PCP: Joya Martyr, MD REFERRING PROVIDER: Marcos Eke, PA-C  Physical Therapy Progress Note   Dates of Reporting Period:09/17/22 - 11/05/22  See Note below for Objective Data and Assessment of Progress/Goals.  Thank you for the referral of this patient. Delpha Perko, PT, DPT   PHYSICAL THERAPY DISCHARGE SUMMARY  Visits from Start of Care: 10  Current functional level related to goals / functional outcomes: Pt requires SBA and use of rollator for safety in community and use of hurrycane at home.    Remaining deficits: Impaired balance, decreased safety awareness, decreased step length/clearance of LLE, impaired cognition    Education / Equipment: HEP, encouragement to reach out to MD regarding potential Lwey Body Dementia    Patient agrees to discharge. Patient goals were partially met. Patient is being discharged due to the patient's request.   END OF SESSION:  PT End of Session - 11/05/22 1016     Visit Number 10    Number of Visits 13   with eval   Date for PT Re-Evaluation 11/12/22    Authorization Type [employer]    Progress Note Due on Visit 10    PT Start Time 1013    PT Stop Time 1103    PT Time Calculation (min) 50 min    Equipment Utilized During Treatment Gait belt    Activity Tolerance Patient tolerated treatment well    Behavior During Therapy WFL for tasks assessed/performed                     Past Medical History:  Diagnosis Date   Arthritis    Dementia (HCC)    GERD (gastroesophageal reflux disease)    watches and NO ISSUES WHEN USING CPAP--  NO MEDS   Hiatal hernia    History of asthma    History of kidney stones    History of multinodular goiter    s/p  thyroid lobectomy 1967   Hypertension    Irritable bowel  syndrome    Left knee DJD    Left ureteral calculus    Nephrolithiasis    bilateral non-obstrucive per CT 03-23-2017   OSA on CPAP    cpap does not know settings    Paroxysmal atrial fibrillation (HCC) newly dx 03-25-2017 (during hospitlization for urosepsis)   A-fib -- Aflutter w/ RVR -- pt started on cardizem and eliquis--- at discharge in [facility]/  cardiologist-  dr hochrein   Past Surgical History:  Procedure Laterality Date   CARPAL TUNNEL RELEASE Bilateral 1989  & 1991    CATARACT EXTRACTION W/ INTRAOCULAR LENS  IMPLANT, BILATERAL  2005  &  2012   CYSTOSCOPY WITH STENT PLACEMENT Left 03/23/2017   Procedure: CYSTOSCOPY, LEFT RETROGRADE WITH STENT PLACEMENT;  Surgeon: Crista Elliot, MD;  Location: [facility];  Service: Urology;  Laterality: Left;   CYSTOSCOPY/URETEROSCOPY/HOLMIUM LASER/STENT PLACEMENT Left 04/10/2017   Procedure: CYSTOSCOPY/URETEROSCOPY/HOLMIUM LASER/STENT PLACEMENT;  Surgeon: Jerilee Field, MD;  Location: [facility];  Service: Urology;  Laterality: Left;   EXTRACORPOREAL SHOCK WAVE LITHOTRIPSY     MULTIPLE   HEMIARTHROPLASTY RIGHT SHOULDER  2007   HOLMIUM LASER APPLICATION Left 11/23/2012   Procedure: HOLMIUM LASER APPLICATION;  Surgeon: Antony Haste, MD;  Location: [facility];  Service: Urology;  Laterality: Left;  KIDNEY STONE SURGERY  1972 and 1977   removal of stones. (OPEN PROCUDURE)   REVERSE SHOULDER ARTHROPLASTY  10/09/2011   Procedure: REVERSE SHOULDER ARTHROPLASTY;  Surgeon: Senaida Lange, MD;  Location: [facility];  Service: Orthopedics;  Laterality: Right;  right hardware removal and reverse shoulder arthroplasty   THYROID LOBECTOMY  1967   goiter removed   TONSILLECTOMY     TOTAL KNEE ARTHROPLASTY  07/22/2012   Procedure: TOTAL KNEE ARTHROPLASTY;  Surgeon: Javier Docker, MD;  Location: [facility];  Service: Orthopedics;  Laterality: Right;   TOTAL KNEE ARTHROPLASTY Left 12/16/2012   Procedure: LEFT TOTAL KNEE  ARTHROPLASTY;  Surgeon: Javier Docker, MD;  Location: [facility];  Service: Orthopedics;  Laterality: Left;   TRANSTHORACIC ECHOCARDIOGRAM  03/25/2017   mild LVH, ef 55-60%/  mild LAE/ trivial PR/  mild MR   TRIGGER FINGER RELEASE Right 2006   URETEROSOPIC STONE EXTRACTION  2002   Patient Active Problem List   Diagnosis Date Noted   Memory impairment 09/11/2022   Mild late onset Alzheimer's dementia without behavioral disturbance, psychotic disturbance, mood disturbance, or anxiety (HCC) 03/14/2022   Closed fracture of patella 06/06/2019   Pain in right knee 05/12/2019   Degenerative spondylolisthesis 04/08/2018   Degeneration of lumbar intervertebral disc 03/18/2018   Scoliosis of lumbar spine 03/18/2018   Spinal stenosis of lumbar region 03/18/2018   Mild dementia (HCC) 01/22/2018   History of total knee replacement, right 01/21/2018   Atrial flutter (HCC) 04/02/2017   Anticoagulated 04/02/2017   Right carotid bruit 04/02/2017   Sepsis (HCC) 03/25/2017   Pyelonephritis 03/25/2017   Left ureteral calculus    Ureteral stone 03/23/2017   Nephrolithiasis 03/23/2017   BPH (benign prostatic hypertrophy) 02/03/2013   Extrinsic asthma, unspecified 01/27/2013   Essential hypertension, benign 01/27/2013   Gout, unspecified 01/27/2013   Left knee DJD 12/16/2012   Right knee DJD 07/22/2012    ONSET DATE: 09/11/2022  REFERRING DIAG: R25.1 (ICD-10-CM) - Tremor of right hand  THERAPY DIAG:  Other abnormalities of gait and mobility  Unsteadiness on feet  Chronic pain of right knee  Rationale for Evaluation and Treatment: Rehabilitation  SUBJECTIVE:                                                                                                                                                                                             SUBJECTIVE STATEMENT: Pt presents to PT alone w/hurrycane. Reports he has been using the rollator for his walking program and it is going well. No new  falls. Has questions about his HEP from last session.   Pt accompanied  by: self and Wife, [name] (at end of session)    PERTINENT HISTORY: history of hypertension, goiter s/p thyroid lobectomy, atrial fibrillation, iron deficiency anemia, with mild dementia, likely due to Alzheimer's disease  PAIN:  Are you having pain? Yes: NPRS scale: 2/10 Pain location: R knee pain Pain description: dull pain Aggravating factors: standing or walking for extended period of time; sit to standing Relieving factors: pain patch  PRECAUTIONS: Fall  WEIGHT BEARING RESTRICTIONS: No  FALLS: Has patient fallen in last 6 months? Yes. Number of falls 3 in the last week; wife has to get help to get him back up  LIVING ENVIRONMENT: Lives with: lives with their family and lives with their spouse Lives in: House/apartment Stairs: No; ramped entrance to one level home Has following equipment at home: Single point cane, Walker - 2 wheeled, and Environmental consultant - 4 wheeled  PLOF: Independent with gait and Independent with transfers  PATIENT GOALS: "to walk pretty good without my knee hurting" "the longer I stay on it the worse it gets"  OBJECTIVE:   DIAGNOSTIC FINDINGS:  R knee xray 03/04/2019 after pt's fall: FINDINGS: Status post right knee replacement with normal alignment and intact hardware. No acute displaced fracture. Small knee effusion   IMPRESSION: Status post right knee replacement. No acute osseous abnormality. Small knee effusion  COGNITION: Overall cognitive status: History of cognitive impairments - at baseline (Alzheimer's with mild dementia)   SENSATION: WFL   LOWER EXTREMITY MMT:    MMT Right Eval Left Eval  Hip flexion 5 5  Hip extension    Hip abduction    Hip adduction    Hip internal rotation    Hip external rotation    Knee flexion 4 4  Knee extension 4 5  Ankle dorsiflexion 5 5  Ankle plantarflexion    Ankle inversion    Ankle eversion    (Blank rows = not  tested)   TODAY'S TREATMENT:                    Ther Act LTG Assessment   [facility] PT Assessment - 11/05/22 1022       Observation/Other Assessments   Other Surveys  Lower Extremity Functional Scale    Lower Extremity Functional Scale  37/80 = 46.25%      Ambulation/Gait   Gait velocity 32.8' over 19.28s = 1.7 ft/s w/o AD   SBA     Berg Balance Test   Sit to Stand Able to stand without using hands and stabilize independently    Standing Unsupported Able to stand safely 2 minutes    Sitting with Back Unsupported but Feet Supported on Floor or Stool Able to sit safely and securely 2 minutes    Stand to Sit Sits safely with minimal use of hands    Transfers Able to transfer safely, minor use of hands    Standing Unsupported with Eyes Closed Able to stand 10 seconds with supervision    Standing Unsupported with Feet Together Able to place feet together independently and stand for 1 minute with supervision    From Standing, Reach Forward with Outstretched Arm Reaches forward but needs supervision    From Standing Position, Pick up Object from Floor Unable to try/needs assist to keep balance   Pt bracing against chair and placing hand on mat table to stabilize   From Standing Position, Turn to Look Behind Over each Shoulder Looks behind one side only/other side shows less weight shift  Turn 360 Degrees Able to turn 360 degrees safely but slowly   7.22s to L, 5.91s to R   Standing Unsupported, Alternately Place Feet on Step/Stool Able to complete >2 steps/needs minimal assist   Min A due to posterior LOB   Standing Unsupported, One Foot in Front Able to take small step independently and hold 30 seconds    Standing on One Leg Unable to try or needs assist to prevent fall    Total Score 35    Berg comment: High fall risk      Timed Up and Go Test   TUG Normal TUG    Normal TUG (seconds) 21.28   no AD, SBA           Reviewed banded seated ankle DF from last session, as pt unsure how to  perform this new exercise at home. Pt able to demonstrate w/verbal and visual cues and verbalized understanding.  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 Wife present for end of session, so provided printed information on Lewy Body Dementia and therapist to message referring provider regarding changes in balance. Educated pt on how to obtain new referral to PT if balance declines and to call therapist with questions regarding potential PD. Pt and wife verbalized understanding.  Strongly encouraged continued use of rollator for walking program and in community for reduced fall risk. Pt and wife verbalized understanding.   Gait pattern: step through pattern, decreased arm swing- Left, decreased step length- Left, decreased stride length, decreased hip/knee flexion- Right, decreased hip/knee flexion- Left, shuffling, decreased trunk rotation, trunk flexed, and poor foot clearance- Left Distance walked: Various clinic distances  Assistive device utilized:  Hurrycane and None Level of assistance: SBA Comments: Pt ambulated into/out of clinic w/hurrycane and throughout session without AD. Pt demonstrates shuffling gait pattern (LLE >RLE) and forward truncal lean. Pt reports feeling as though he is moving too quickly and cannot slow down, which is new in the past few weeks. Without AD, pt requires SBA due to catching of L foot and decreased attention to L side.                                                                                            PATIENT EDUCATION: Education details: Goal outcomes, info on Lewy Body Dementia, use of rollator for all community mobility  Person educated: Patient and Spouse Education method: Explanation, Demonstration, and Handouts Education comprehension: verbalized understanding  HOME EXERCISE PROGRAM: Access Code: [email or web address] URL:  [email or web address] Date: 10/21/2022 Prepared by: Alethia Berthold Jentry Mcqueary  Program Notes You Can Walk For A Certain Length Of Time Each Day          Walk 10 minutes 2-3 times per day.                    Work up to 20 minutes (1-2 times per day with rollator).   Exercises - Supine Knee Extension Strengthening  - 1 x daily - 7 x weekly - 3 sets - 10 reps - 5 sec hold -  Active Straight Leg Raise with Quad Set  - 1 x daily - 7 x weekly - 3 sets - 10 reps - Supine Bridge  - 1 x daily - 7 x weekly - 3 sets - 10 reps - 5 sec hold  Medbridge Access Code: [email or web address] URL: [email or web address] Date: 10/23/2022 Prepared by: Maebelle Munroe  Exercises - Seated Ankle Dorsiflexion with Resistance  - 1 x daily - 7 x weekly - 2 sets - 10 reps - 3-5 sec hold                     GOALS: Goals reviewed with patient? Yes  SHORT TERM GOALS: Target date: 10/08/2022  Pt will be independent with initial HEP for improved strength, balance, transfers and gait. Baseline: established HEP and handout provided several times, pt with poor recall of HEP and inconsistently performing at home Goal status: IN PROGRESS  2.  Pt will improve gait velocity to at least 1.75 ft/sec for improved gait efficiency and performance at SBA level  Baseline: 1.64 ft/sec with SBA (3/20), 2.16 ft/sec (4/10) Goal status: MET  3.  Pt will improve normal TUG to less than or equal to 21 seconds for improved functional mobility and decreased fall risk. Baseline: 24.43 sec (3/20), 15.28 sec (4/10) Goal status: MET  4.  Pt will improve Berg score to 36/56 for decreased fall risk Baseline: 33/56 (3/20), 38/56 (4/10) Goal status: MET  5.  Pt will improve 5 x STS to less than or equal to 20 seconds to demonstrate improved functional strength and transfer efficiency.  Baseline: 23.9 sec (3/20), 16.34 sec (4/10) Goal status: MET   LONG TERM GOALS: Target date: 10/29/2022  Pt will be independent with final HEP for  improved strength, balance, transfers and gait. Baseline:  Goal status: MET  2.  Pt will improve gait velocity to at least 2.25 ft/sec for improved gait efficiency and performance at mod I level  Baseline: 1.64 ft/sec SBA (3/20), 2.16 ft/sec (4/10); 1.7 ft/s no AD (5/8) Goal status: NOT MET  3.  Pt will improve normal TUG to less than or equal to 13 seconds for improved functional mobility and decreased fall risk. Baseline: 24.43 sec (3/20), 15.28 sec (4/10); 21.28s no AD (5/8) Goal status: NOT MET  4.  Pt will improve Berg score to 39/56 for decreased fall risk Baseline: 33/56 (3/20), 38/56 (4/10); 35/56 (5/8) Goal status: NOT MET  5.  Pt will improve score on LEFS to 30/80 to demonstrate decreased disability level Baseline: 26/80 (3/20); 37/80 (5/8) Goal status: MET   ASSESSMENT:  CLINICAL IMPRESSION: Emphasis of skilled PT session on LTG assessment and DC from PT per pt request. Pt has met 2 of 5 LTGs, improving his LEFS to 37/80 and performing his HEP regularly. Pt scored lower on Berg Balance Scale this date and has also declined w/gait speed on and TUG. Informed pt of decline in balance and gait speed and encouraged continuation of PT, but pt more concerned w/improvement in R knee pain as indicated by improved score on LEFS and is requesting to DC. Provided printed handout of information on Lewy Body Dementia, as pt demonstrates Parkinsonian gait and balance impairments (shuffling gait, bradykinesia, posterior LOB). Informed pt and wife that therapist will communicate concerns to referring physician but also encouraged pt to call on his behalf. Pt and wife verbalized understanding. Educated pt on how to obtain new PT referral if balance declines, pt verbalized understanding.    OBJECTIVE IMPAIRMENTS:  Abnormal gait, decreased balance, decreased cognition, difficulty walking, decreased strength, and pain.   ACTIVITY LIMITATIONS: standing, squatting, and stairs  PARTICIPATION  LIMITATIONS: community activity and yard work  PERSONAL FACTORS: Age, Time since onset of injury/illness/exacerbation, and 3+ comorbidities:    history of  hypertension, goiter s/p thyroid lobectomy, atrial fibrillation, iron deficiency anemia, with mild dementia, likely due to Alzheimer's diseaseare also affecting patient's functional outcome.   REHAB POTENTIAL: Fair time since onset, age  CLINICAL DECISION MAKING: Stable/uncomplicated  EVALUATION COMPLEXITY: Low  PLAN:  PT FREQUENCY: 1-2x/week  PT DURATION: 6 weeks  PLANNED INTERVENTIONS: Therapeutic exercises, Therapeutic activity, Neuromuscular re-education, Balance training, Gait training, Patient/Family education, Self Care, Joint mobilization, Stair training, Orthotic/Fit training, DME instructions, Aquatic Therapy, Dry Needling, Cognitive remediation, Electrical stimulation, Cryotherapy, Moist heat, Taping, Manual therapy, and Re-evaluation    Estaban [place] E Mirabella Hilario, PT, DPT 11/05/2022, 12:07 PM

## 2022-11-11 DIAGNOSIS — C4442 Squamous cell carcinoma of skin of scalp and neck: Secondary | ICD-10-CM | POA: Diagnosis not present

## 2022-12-10 DIAGNOSIS — H5201 Hypermetropia, right eye: Secondary | ICD-10-CM | POA: Diagnosis not present

## 2022-12-12 DIAGNOSIS — Z0100 Encounter for examination of eyes and vision without abnormal findings: Secondary | ICD-10-CM | POA: Diagnosis not present

## 2022-12-31 DIAGNOSIS — K219 Gastro-esophageal reflux disease without esophagitis: Secondary | ICD-10-CM | POA: Diagnosis not present

## 2022-12-31 DIAGNOSIS — Z79899 Other long term (current) drug therapy: Secondary | ICD-10-CM | POA: Diagnosis not present

## 2022-12-31 DIAGNOSIS — N1831 Chronic kidney disease, stage 3a: Secondary | ICD-10-CM | POA: Diagnosis not present

## 2022-12-31 DIAGNOSIS — N401 Enlarged prostate with lower urinary tract symptoms: Secondary | ICD-10-CM | POA: Diagnosis not present

## 2022-12-31 DIAGNOSIS — M1000 Idiopathic gout, unspecified site: Secondary | ICD-10-CM | POA: Diagnosis not present

## 2022-12-31 DIAGNOSIS — F02A0 Dementia in other diseases classified elsewhere, mild, without behavioral disturbance, psychotic disturbance, mood disturbance, and anxiety: Secondary | ICD-10-CM | POA: Diagnosis not present

## 2022-12-31 DIAGNOSIS — I700 Atherosclerosis of aorta: Secondary | ICD-10-CM | POA: Diagnosis not present

## 2022-12-31 DIAGNOSIS — G301 Alzheimer's disease with late onset: Secondary | ICD-10-CM | POA: Diagnosis not present

## 2022-12-31 DIAGNOSIS — Z136 Encounter for screening for cardiovascular disorders: Secondary | ICD-10-CM | POA: Diagnosis not present

## 2022-12-31 DIAGNOSIS — I10 Essential (primary) hypertension: Secondary | ICD-10-CM | POA: Diagnosis not present

## 2022-12-31 DIAGNOSIS — Z0001 Encounter for general adult medical examination with abnormal findings: Secondary | ICD-10-CM | POA: Diagnosis not present

## 2023-01-15 DIAGNOSIS — N200 Calculus of kidney: Secondary | ICD-10-CM | POA: Diagnosis not present

## 2023-01-15 DIAGNOSIS — R3129 Other microscopic hematuria: Secondary | ICD-10-CM | POA: Diagnosis not present

## 2023-01-15 DIAGNOSIS — R351 Nocturia: Secondary | ICD-10-CM | POA: Diagnosis not present

## 2023-03-09 ENCOUNTER — Other Ambulatory Visit: Payer: Self-pay | Admitting: Physician Assistant

## 2023-03-13 ENCOUNTER — Encounter: Payer: Self-pay | Admitting: Physician Assistant

## 2023-03-13 ENCOUNTER — Ambulatory Visit: Payer: Medicare HMO | Admitting: Physician Assistant

## 2023-03-13 VITALS — BP 139/64 | HR 64 | Resp 20 | Ht 69.0 in

## 2023-03-13 DIAGNOSIS — F02A0 Dementia in other diseases classified elsewhere, mild, without behavioral disturbance, psychotic disturbance, mood disturbance, and anxiety: Secondary | ICD-10-CM

## 2023-03-13 DIAGNOSIS — G301 Alzheimer's disease with late onset: Secondary | ICD-10-CM | POA: Diagnosis not present

## 2023-03-13 MED ORDER — GALANTAMINE HYDROBROMIDE 12 MG PO TABS: 12.0000 mg | ORAL_TABLET | Freq: Two times a day (BID) | ORAL | 11 refills | Status: DC

## 2023-03-13 NOTE — Patient Instructions (Addendum)
Always good to see you!   1.Continue Galantamine 12mg  twice a day    Increase activity and brain games   Continue Lexapro  5 mg at night   Resume the CPAP  3. Follow-up in 6 months, call for any changes.   FALL PRECAUTIONS: Be cautious when walking. Scan the area for obstacles that may increase the risk of trips and falls. When getting up in the mornings, sit up at the edge of the bed for a few minutes before getting out of bed. Consider elevating the bed at the head end to avoid drop of blood pressure when getting up. Walk always in a well-lit room (use night lights in the walls). Avoid area rugs or power cords from appliances in the middle of the walkways. Use a walker or a cane if necessary and consider physical therapy for balance exercise. Get your eyesight checked regularly.  HOME SAFETY: Consider the safety of the kitchen when operating appliances like stoves, microwave oven, and blender. Consider having supervision and share cooking responsibilities until no longer able to participate in those. Accidents with firearms and other hazards in the house should be identified and addressed as well.  DRIVING: Regarding driving, in patients with progressive memory problems, driving will be impaired. We advise to have someone else do the driving if trouble finding directions or if minor accidents are reported. Independent driving assessment is available to determine safety of driving.  ABILITY TO BE LEFT ALONE: If patient is unable to contact 911 operator, consider using LifeLine, or when the need is there, arrange for someone to stay with patients. Smoking is a fire hazard, consider supervision or cessation. Risk of wandering should be assessed by caregiver and if detected at any point, supervision and safe proof recommendations should be instituted.  MEDICATION SUPERVISION: Inability to self-administer medication needs to be constantly addressed. Implement a mechanism to ensure safe  administration of the medications.  RECOMMENDATIONS FOR ALL PATIENTS WITH MEMORY PROBLEMS: 1. Continue to exercise (Recommend 30 minutes of walking everyday, or 3 hours every week) 2. Increase social interactions - continue going to [place] and enjoy social gatherings with friends and family 3. Eat healthy, avoid fried foods and eat more fruits and vegetables 4. Maintain adequate blood pressure, blood sugar, and blood cholesterol level. Reducing the risk of stroke and cardiovascular disease also helps promoting better memory. 5. Avoid stressful situations. Live a simple life and avoid aggravations. Organize your time and prepare for the next day in anticipation. 6. Sleep well, avoid any interruptions of sleep and avoid any distractions in the bedroom that may interfere with adequate sleep quality 7. Avoid sugar, avoid sweets as there is a strong link between excessive sugar intake, diabetes, and cognitive impairment The Mediterranean diet has been shown to help patients reduce the risk of progressive memory disorders and reduces cardiovascular risk. This includes eating fish, eat fruits and green leafy vegetables, nuts like almonds and hazelnuts, walnuts, and also use olive oil. Avoid fast foods and fried foods as much as possible. Avoid sweets and sugar as sugar use has been linked to worsening of memory function.  There is always a concern of gradual progression of memory problems. If this is the case, then we may need to adjust level of care according to patient needs. Support, both to the patient and caregiver, should then be put into place.

## 2023-03-13 NOTE — Progress Notes (Signed)
Assessment/Plan:   Dementia likely due to Alzheimer's Disease, mild.  Darrell Perez is a very pleasant 82 y.o. RH male with a history of hypertension, goiter s/p thyroid lobectomy, atrial fibrillation, iron deficiency anemia, with mild dementia, likely due to Alzheimer's disease seen today in follow up for memory loss. Patient is currently on galantamine 12 mg twice daily due to side effects from donepezil and memantine, tolerating well. Memory is stable, MMSE today is 26/30. Patient is able to participate on his IADLs.  Continues to drive seldom, and only short distances     Follow up in  6 months. Continue galantamine 12 mg twice daily, side effects discussed Recommend good control of her cardiovascular risk factors Continue Lexapro 5 mg nightly for mood control    Subjective:    This patient is accompanied in the office by his wife who supplements the history.  Previous records as well as any outside records available were reviewed prior to todays visit. Patient was last seen on 09/11/2022 with MMSE 25/30    Any changes in memory since last visit? ".  He may have some difficulty remembering recent conversations, new information some names.  He keeps a calendar to help him remember events.  He enjoys reading. repeats oneself?  Endorsed, especially with appointments Disoriented when walking into a room?  Patient denies.    Leaving objects in unusual places?  May misplace things but not in unusual places.  He tries to leave his personal staff in his room so that he knows that he can find it.   Wandering behavior?  denies   Any personality changes since last visit?  denies   Any worsening depression?:  He is on Lexapro since March of this year, as he was more withdrawn due to memory changes, which helped to some extent. Hallucinations or paranoia?  Denies.   Seizures? denies    Any sleep changes?  Sleeps well.  Denies vivid dreams, REM behavior or sleepwalking   Sleep apnea?   Endorsed, uses a CPAP, not as frequently. Any hygiene concerns? Denies.  Independent of bathing and dressing?  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 Does the patient needs help with medications?  Patient is in charge. Rarely he misses a dose.  Who is in charge of the finances?  Wife is in charge    Any changes in appetite?  Denies.     Patient have trouble swallowing? Denies.   Does the patient cook? No Any headaches? Denies.   Chronic back pain  denies.  Ambulates with difficulty?  He walks slowly, had PT for strength and balance with good results.  He continues to do exercises at home. He walks frequently  Recent falls or head injuries? Some mechanical falls, no fractures, no head injury, no LOC Unilateral weakness, numbness or tingling? Denies.   Any tremors?  He has chronic right hand tremor. Any anosmia?  Denies   Any incontinence of urine?  Endorsed, has a history of urinary urgency, followed by urology Any bowel dysfunction?   Denies      Patient lives with his wife Does the patient drive?  Endorsed, only short distances, "no unless I have to". denies getting lost       History on Initial Assessment: This is a pleasant 82 year old right-handed man with a history of hypertension, goiter s/p thyroid lobectomy, atrial fibrillation, dementia, presenting to establish care. He feels his memory is pretty good. His wife started noticing memory changes around 5 years  ago where he was getting more forgetful, but worse in the past 1-2 years. He had sepsis in the Fall and things seemed to accelerate then. He had more difficulties initially when he got home, but has acclimated since then. His wife has taken over most of the driving after he made her nervous trying to turn left onto oncoming traffic 1.5 years ago and did not seem to comprehend this was difficult. He continues to manage his own medications. There have been a few bills that he has let go too long without paying, which is unusual.  He has word-finding difficulties. His wife denies any personality changes, no paranoia or hallucinations. He had abnormal dreams on Aricept and is taking Galantamine, however this is quite costly for their budget and his wife wonders if there is any true benefit from it. No side effects on galantamine.  He has low back pain and both legs feel weak. Otherwise he denies any headaches, dizziness, vision changes, dysarthria/dysphagia, neck pain, focal numbness/tingling, bowel/bladder dysfunction, anosmia, or tremors. Sleep is good. No falls. Both parents had dementia. He denies any history of significant head injuries or alcohol use.    I personally reviewed MRI brain with and without contrast done 10/2015 which did not show any acute changes. There was mild dilatation of the lateral ventricles with sparing of the temporal horns, mild diffuse atrophy and chronic microvascular disease.   Laboratory Data: TSH 03/2017 normal 1.13 PREVIOUS MEDICATIONS: Donepezil and memantine  CURRENT MEDICATIONS:  Outpatient Encounter Medications as of 03/13/2023  Medication Sig   acetaminophen (TYLENOL) 500 MG tablet Take by mouth every 6 (six) hours as needed for mild pain, moderate pain, fever or headache.    allopurinol (ZYLOPRIM) 100 MG tablet Take 100 mg by mouth every morning.    cetirizine (ZYRTEC) 10 MG tablet 1 tablet   cholecalciferol (VITAMIN D) 1000 UNITS tablet Take 1,000 Units by mouth daily.   diltiazem (CARDIZEM CD) 240 MG 24 hr capsule Take 1 capsule (240 mg total) by mouth every morning. Patient needs appointment (Patient taking differently: Take 240 mg by mouth every morning. Patient needs appointment Pt takes BID)   escitalopram (LEXAPRO) 5 MG tablet Take 1 tablet (5 mg total) by mouth at bedtime.   HYDROcodone-acetaminophen (NORCO/VICODIN) 5-325 MG tablet Take 1 tablet by mouth every 6 (six) hours as needed for moderate pain.   hyoscyamine (ANASPAZ) 0.125 MG TBDP disintergrating tablet Place 0.125  mg under the tongue every 6 (six) hours as needed for cramping.   loperamide (IMODIUM) 2 MG capsule Take 1-2 mg by mouth as needed.   pantoprazole (PROTONIX) 40 MG tablet Take 1 tablet by mouth daily.   polyethylene glycol (MIRALAX / GLYCOLAX) packet Take 17 g by mouth daily.   potassium chloride SA (K-DUR,KLOR-CON) 20 MEQ tablet Take 20 mEq by mouth 2 (two) times daily.   saccharomyces boulardii (FLORASTOR) 250 MG capsule 1 capsule   tolterodine (DETROL LA) 4 MG 24 hr capsule Take by mouth daily.   valACYclovir (VALTREX) 1000 MG tablet Take 1 tablet (1,000 mg total) by mouth 3 (three) times daily.   vitamin B-12 (CYANOCOBALAMIN) 1000 MCG tablet Take 1,000 mcg by mouth every morning.    [DISCONTINUED] galantamine (RAZADYNE) 12 MG tablet Take 1 tablet (12 mg total) by mouth 2 (two) times daily.   [DISCONTINUED] rivastigmine (EXELON) 1.5 MG capsule TAKE 1 CAPSULE BY MOUTH TWICE DAILY   galantamine (RAZADYNE) 12 MG tablet Take 1 tablet (12 mg total) by mouth 2 (two) times  daily.   indapamide (LOZOL) 2.5 MG tablet Take 2.5 mg by mouth every evening.    [DISCONTINUED] galantamine (RAZADYNE) 12 MG tablet Take 1 tablet (12 mg total) by mouth 2 (two) times daily.   No facility-administered encounter medications on file as of 03/13/2023.       03/13/2023    1:00 PM 09/11/2022    5:00 PM 03/14/2022    6:00 AM  MMSE - Mini Mental State Exam  Orientation to time 4 3 4   Orientation to Place 5 5 5   Registration 3 3 3   Attention/ Calculation 4 4 5   Recall 1 1 2   Language- name 2 objects 2 2 2   Language- repeat 1 1 1   Language- follow 3 step command 3 3 3   Language- read & follow direction 1 1 1   Write a sentence 1 1 1   Copy design 1 1 1   Total score 26 25 28       04/07/2019    7:00 AM 09/01/2018    8:00 AM  Montreal Cognitive Assessment   Visuospatial/ Executive (0/5)  3  Naming (0/3)  2  Attention: Read list of digits (0/2) 2 1  Attention: Read list of letters (0/1) 1 1  Attention: Serial 7  subtraction starting at 100 (0/3) 2 2  Language: Repeat phrase (0/2) 1 0  Language : Fluency (0/1) 1 0  Abstraction (0/2) 2 2  Delayed Recall (0/5) 1 1  Orientation (0/6) 5 6  Total  18    Objective:     PHYSICAL EXAMINATION:    VITALS:   Vitals:   03/13/23 1050  BP: 139/64  Pulse: 64  Resp: 20  SpO2: 97%  Height: 5\' 9"  (1.753 m)    GEN:  The patient appears stated age and is in NAD. HEENT:  Normocephalic, atraumatic.   Neurological examination:  General: NAD, well-groomed, appears stated age. Orientation: The patient is alert. Oriented to person, place and date Cranial nerves: There is good facial symmetry.The speech is fluent and clear. No aphasia or dysarthria. Fund of knowledge is appropriate. Recent and remote memory are impaired. Attention and concentration are reduced.  Able to name objects and repeat phrases.  Hearing is intact to conversational tone.   Sensation: Sensation is intact to light touch throughout Motor: Strength is at least antigravity x4. DTR's 2/4 in UE/LE     Movement examination: Tone: There is normal tone in the UE/LE Abnormal movements:  no tremor.  No myoclonus.  No asterixis.   Coordination:  There is no decremation with RAM's. Normal finger to nose  Gait and Station: The patient has no difficulty arising out of a deep-seated chair without the use of the hands. The patient's stride length is short. Gait is cautious and slightly broad based.   Thank you for allowing [place] the opportunity to participate in the care of this nice patient. Please do not hesitate to contact us for any questions or concerns.   Total time spent on today's visit was 26 minutes dedicated to this patient today, preparing to see patient, examining the patient, ordering tests and/or medications and counseling the patient, documenting clinical information in the EHR or other health record, independently interpreting results and communicating results to the patient/family,  discussing treatment and goals, answering patient's questions and coordinating care.  Cc:  [facility]  Marlowe Kays 03/13/2023 1:06 PM

## 2023-03-24 DIAGNOSIS — L821 Other seborrheic keratosis: Secondary | ICD-10-CM | POA: Diagnosis not present

## 2023-03-24 DIAGNOSIS — L853 Xerosis cutis: Secondary | ICD-10-CM | POA: Diagnosis not present

## 2023-03-24 DIAGNOSIS — D225 Melanocytic nevi of trunk: Secondary | ICD-10-CM | POA: Diagnosis not present

## 2023-03-24 DIAGNOSIS — Z85828 Personal history of other malignant neoplasm of skin: Secondary | ICD-10-CM | POA: Diagnosis not present

## 2023-03-24 DIAGNOSIS — L570 Actinic keratosis: Secondary | ICD-10-CM | POA: Diagnosis not present

## 2023-03-24 DIAGNOSIS — L720 Epidermal cyst: Secondary | ICD-10-CM | POA: Diagnosis not present

## 2023-03-24 DIAGNOSIS — L814 Other melanin hyperpigmentation: Secondary | ICD-10-CM | POA: Diagnosis not present

## 2023-03-24 DIAGNOSIS — Z08 Encounter for follow-up examination after completed treatment for malignant neoplasm: Secondary | ICD-10-CM | POA: Diagnosis not present

## 2023-04-13 DIAGNOSIS — M25561 Pain in right knee: Secondary | ICD-10-CM | POA: Diagnosis not present

## 2023-04-14 ENCOUNTER — Other Ambulatory Visit (HOSPITAL_COMMUNITY): Payer: Self-pay | Admitting: Family Medicine

## 2023-04-14 DIAGNOSIS — R1030 Lower abdominal pain, unspecified: Secondary | ICD-10-CM

## 2023-04-16 ENCOUNTER — Ambulatory Visit (HOSPITAL_COMMUNITY)
Admission: RE | Admit: 2023-04-16 | Discharge: 2023-04-16 | Disposition: A | Discharge status: Home / Self Care | Payer: Medicare HMO | Source: Ambulatory Visit | Attending: Family Medicine | Admitting: Family Medicine

## 2023-04-16 DIAGNOSIS — N200 Calculus of kidney: Secondary | ICD-10-CM | POA: Diagnosis not present

## 2023-04-16 DIAGNOSIS — N281 Cyst of kidney, acquired: Secondary | ICD-10-CM | POA: Diagnosis not present

## 2023-04-16 DIAGNOSIS — I7143 Infrarenal abdominal aortic aneurysm, without rupture: Secondary | ICD-10-CM | POA: Diagnosis not present

## 2023-04-16 DIAGNOSIS — R1030 Lower abdominal pain, unspecified: Secondary | ICD-10-CM | POA: Diagnosis not present

## 2023-04-16 DIAGNOSIS — K5730 Diverticulosis of large intestine without perforation or abscess without bleeding: Secondary | ICD-10-CM | POA: Diagnosis not present

## 2023-04-16 MED ORDER — IOHEXOL 300 MG/ML  SOLN
100.0000 mL | Freq: Once | INTRAMUSCULAR | Status: AC | PRN
  Administered 2023-04-16: 100 mL via INTRAVENOUS

## 2023-04-16 MED ORDER — IOHEXOL 9 MG/ML PO SOLN
1000.0000 mL | Freq: Once | ORAL | Status: AC
  Administered 2023-04-16: 1000 mL via ORAL

## 2023-04-29 DIAGNOSIS — F0390 Unspecified dementia without behavioral disturbance: Secondary | ICD-10-CM | POA: Diagnosis not present

## 2023-04-29 DIAGNOSIS — R42 Dizziness and giddiness: Secondary | ICD-10-CM | POA: Diagnosis not present

## 2023-05-13 DIAGNOSIS — F0390 Unspecified dementia without behavioral disturbance: Secondary | ICD-10-CM | POA: Diagnosis not present

## 2023-05-13 DIAGNOSIS — R42 Dizziness and giddiness: Secondary | ICD-10-CM | POA: Diagnosis not present

## 2023-05-13 DIAGNOSIS — I10 Essential (primary) hypertension: Secondary | ICD-10-CM | POA: Diagnosis not present

## 2023-07-03 DIAGNOSIS — I10 Essential (primary) hypertension: Secondary | ICD-10-CM | POA: Diagnosis not present

## 2023-07-03 DIAGNOSIS — J069 Acute upper respiratory infection, unspecified: Secondary | ICD-10-CM | POA: Diagnosis not present

## 2023-07-03 DIAGNOSIS — N1831 Chronic kidney disease, stage 3a: Secondary | ICD-10-CM | POA: Diagnosis not present

## 2023-07-03 DIAGNOSIS — F0390 Unspecified dementia without behavioral disturbance: Secondary | ICD-10-CM | POA: Diagnosis not present

## 2023-07-03 DIAGNOSIS — K219 Gastro-esophageal reflux disease without esophagitis: Secondary | ICD-10-CM | POA: Diagnosis not present

## 2023-07-07 DIAGNOSIS — Z03818 Encounter for observation for suspected exposure to other biological agents ruled out: Secondary | ICD-10-CM | POA: Diagnosis not present

## 2023-07-16 DIAGNOSIS — M65341 Trigger finger, right ring finger: Secondary | ICD-10-CM | POA: Insufficient documentation

## 2023-07-16 DIAGNOSIS — M79646 Pain in unspecified finger(s): Secondary | ICD-10-CM | POA: Insufficient documentation

## 2023-07-30 DIAGNOSIS — I129 Hypertensive chronic kidney disease with stage 1 through stage 4 chronic kidney disease, or unspecified chronic kidney disease: Secondary | ICD-10-CM | POA: Diagnosis not present

## 2023-07-30 DIAGNOSIS — E876 Hypokalemia: Secondary | ICD-10-CM | POA: Diagnosis not present

## 2023-07-30 DIAGNOSIS — Z818 Family history of other mental and behavioral disorders: Secondary | ICD-10-CM | POA: Diagnosis not present

## 2023-07-30 DIAGNOSIS — K219 Gastro-esophageal reflux disease without esophagitis: Secondary | ICD-10-CM | POA: Diagnosis not present

## 2023-07-30 DIAGNOSIS — Z85828 Personal history of other malignant neoplasm of skin: Secondary | ICD-10-CM | POA: Diagnosis not present

## 2023-07-30 DIAGNOSIS — Z8249 Family history of ischemic heart disease and other diseases of the circulatory system: Secondary | ICD-10-CM | POA: Diagnosis not present

## 2023-07-30 DIAGNOSIS — G309 Alzheimer's disease, unspecified: Secondary | ICD-10-CM | POA: Diagnosis not present

## 2023-07-30 DIAGNOSIS — Z9181 History of falling: Secondary | ICD-10-CM | POA: Diagnosis not present

## 2023-07-30 DIAGNOSIS — I4892 Unspecified atrial flutter: Secondary | ICD-10-CM | POA: Diagnosis not present

## 2023-07-30 DIAGNOSIS — I4891 Unspecified atrial fibrillation: Secondary | ICD-10-CM | POA: Diagnosis not present

## 2023-07-30 DIAGNOSIS — N189 Chronic kidney disease, unspecified: Secondary | ICD-10-CM | POA: Diagnosis not present

## 2023-07-30 DIAGNOSIS — M109 Gout, unspecified: Secondary | ICD-10-CM | POA: Diagnosis not present

## 2023-09-11 ENCOUNTER — Ambulatory Visit: Payer: Medicare HMO | Admitting: Physician Assistant

## 2023-09-11 ENCOUNTER — Encounter: Payer: Self-pay | Admitting: Physician Assistant

## 2023-09-11 VITALS — BP 136/70 | HR 78 | Resp 18 | Ht 69.0 in | Wt 183.0 lb

## 2023-09-11 DIAGNOSIS — F02A0 Dementia in other diseases classified elsewhere, mild, without behavioral disturbance, psychotic disturbance, mood disturbance, and anxiety: Secondary | ICD-10-CM | POA: Diagnosis not present

## 2023-09-11 DIAGNOSIS — G301 Alzheimer's disease with late onset: Secondary | ICD-10-CM

## 2023-09-11 NOTE — Progress Notes (Signed)
 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 RH male with a history of hypertension, goiter s/p thyroid lobectomy, atrial fibrillation, iron deficiency anemia, with mild dementia, likely due to Alzheimer's disease seen today in follow up for memory loss. He was on galantamine 12 mg twice daily but was unable to tolerate the medicine. As recalled, he had side effects of donepezil and memantine as well. His memory is stable, today's MMSE at 27/30. He is able to participate in his ADLs.  Continues to drive seldom, only very short distances. Mood is good    Follow up in 6  months. Recommend good control of her cardiovascular risk factors.  Follow-up with cardiology Continue to control mood as per PCP, he is on Lexapro 5 mg nightly for mood control Recommend the use of CPAP regularly for OSA    Subjective:    This patient is accompanied in the office by his wife  who supplements the history.  Previous records as well as any outside records available were reviewed prior to todays visit. Patient was last seen on 03/13/2023, with MMSE 26/30    Any changes in memory since last visit? "About the same". Patient has some difficulty remembering recent conversations and names of people.  She keeps a calendar to help him remember events.  He enjoys reading, crossword puzzles. repeats oneself?  Endorsed, with appointments Disoriented when walking into a room?  Patient denies    Leaving objects?  May misplace things but not in unusual places.  He tries to leave his stuff in one place in the room so that he doe not lose them.   Wandering behavior?  denies   Any personality changes since last visit?  denies   Any worsening depression?:  Denies. Hallucinations or paranoia? Denies Seizures? denies    Any sleep changes?  Sleeps well.  Denies vivid dreams since stopping galantamine 1 month ago, denies REM behavior or sleepwalking   Sleep  apnea?   Source, uses CPAP but not frequently.   Any hygiene concerns? Denies.  Independent of bathing and dressing?  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 Does the patient needs help with medications?  Patient is in charge, rarely misses a dose  Who is in charge of the finances?  Wife is in charge     Any changes in appetite?  Denies.     Patient have trouble swallowing? Denies.   Does the patient cook? No Any headaches?   denies   Chronic back pain  denies   Ambulates with difficulty?   He tries to walk frequently.  Recent falls or head injuries? denies     Unilateral weakness, numbness or tingling? denies   Any tremors?  Yes chronic right hand tremor  Any anosmia?  Denies   Any incontinence of urine?  Endorsed. Has a history of urinary urgency followed by urology Any bowel dysfunction?   Denies      Patient lives with his wife  Does the patient drive?  Only short distances, "very little".  Denies getting lost      History on Initial Assessment: This is a pleasant 83 year old right-handed man with a history of hypertension, goiter s/p thyroid lobectomy, atrial fibrillation, dementia, presenting to establish care. He feels his memory is pretty good. His wife started noticing memory changes around 5 years ago where he was getting more forgetful, but worse in the past 1-2 years.  He had sepsis in the Fall and things seemed to accelerate then. He had more difficulties initially when he got home, but has acclimated since then. His wife has taken over most of the driving after he made her nervous trying to turn left onto oncoming traffic 1.5 years ago and did not seem to comprehend this was difficult. He continues to manage his own medications. There have been a few bills that he has let go too long without paying, which is unusual. He has word-finding difficulties. His wife denies any personality changes, no paranoia or hallucinations. He had abnormal dreams on Aricept and is taking  Galantamine, however this is quite costly for their budget and his wife wonders if there is any true benefit from it. No side effects on galantamine.  He has low back pain and both legs feel weak. Otherwise he denies any headaches, dizziness, vision changes, dysarthria/dysphagia, neck pain, focal numbness/tingling, bowel/bladder dysfunction, anosmia, or tremors. Sleep is good. No falls. Both parents had dementia. He denies any history of significant head injuries or alcohol use.    I personally reviewed MRI brain with and without contrast done 10/2015 which did not show any acute changes. There was mild dilatation of the lateral ventricles with sparing of the temporal horns, mild diffuse atrophy and chronic microvascular disease.    PREVIOUS MEDICATIONS: Donepezil and memantine, galantamine (weird dreams, hallucinations)   CURRENT MEDICATIONS:  Outpatient Encounter Medications as of 09/11/2023  Medication Sig   acetaminophen (TYLENOL) 500 MG tablet Take by mouth every 6 (six) hours as needed for mild pain, moderate pain, fever or headache.    allopurinol (ZYLOPRIM) 100 MG tablet Take 100 mg by mouth every morning.    cetirizine (ZYRTEC) 10 MG tablet 1 tablet   cholecalciferol (VITAMIN D) 1000 UNITS tablet Take 1,000 Units by mouth daily.   diltiazem (CARDIZEM CD) 240 MG 24 hr capsule Take 1 capsule (240 mg total) by mouth every morning. Patient needs appointment (Patient taking differently: Take 240 mg by mouth every morning. Patient needs appointment Pt takes BID)   escitalopram (LEXAPRO) 5 MG tablet Take 1 tablet (5 mg total) by mouth at bedtime.   HYDROcodone-acetaminophen (NORCO/VICODIN) 5-325 MG tablet Take 1 tablet by mouth every 6 (six) hours as needed for moderate pain.   hyoscyamine (ANASPAZ) 0.125 MG TBDP disintergrating tablet Place 0.125 mg under the tongue every 6 (six) hours as needed for cramping.   indapamide (LOZOL) 2.5 MG tablet Take 2.5 mg by mouth every evening.    loperamide  (IMODIUM) 2 MG capsule Take 1-2 mg by mouth as needed.   pantoprazole (PROTONIX) 40 MG tablet Take 1 tablet by mouth daily.   polyethylene glycol (MIRALAX / GLYCOLAX) packet Take 17 g by mouth daily.   potassium chloride SA (K-DUR,KLOR-CON) 20 MEQ tablet Take 20 mEq by mouth 2 (two) times daily.   saccharomyces boulardii (FLORASTOR) 250 MG capsule 1 capsule   tolterodine (DETROL LA) 4 MG 24 hr capsule Take by mouth daily.   valACYclovir (VALTREX) 1000 MG tablet Take 1 tablet (1,000 mg total) by mouth 3 (three) times daily.   vitamin B-12 (CYANOCOBALAMIN) 1000 MCG tablet Take 1,000 mcg by mouth every morning.    galantamine (RAZADYNE) 12 MG tablet Take 1 tablet (12 mg total) by mouth 2 (two) times daily. (Patient not taking: Reported on 09/11/2023)   No facility-administered encounter medications on file as of 09/11/2023.       09/11/2023   12:00 PM 03/13/2023    1:00  PM 09/11/2022    5:00 PM  MMSE - Mini Mental State Exam  Orientation to time 4 4 3   Orientation to Place 5 5 5   Registration 3 3 3   Attention/ Calculation 5 4 4   Recall 1 1 1   Language- name 2 objects 2 2 2   Language- repeat 1 1 1   Language- follow 3 step command 3 3 3   Language- read & follow direction 1 1 1   Write a sentence 1 1 1   Copy design 1 1 1   Total score 27 26 25       04/07/2019    7:00 AM 09/01/2018    8:00 AM  Montreal Cognitive Assessment   Visuospatial/ Executive (0/5)  3  Naming (0/3)  2  Attention: Read list of digits (0/2) 2 1  Attention: Read list of letters (0/1) 1 1  Attention: Serial 7 subtraction starting at 100 (0/3) 2 2  Language: Repeat phrase (0/2) 1 0  Language : Fluency (0/1) 1 0  Abstraction (0/2) 2 2  Delayed Recall (0/5) 1 1  Orientation (0/6) 5 6  Total  18    Objective:     PHYSICAL EXAMINATION:    VITALS:   Vitals:   09/11/23 1043  BP: 136/70  Pulse: 78  Resp: 18  SpO2: 97%  Weight: 183 lb (83 kg)  Height: 5\' 9"  (1.753 m)    GEN:  The patient appears stated age  and is in NAD. HEENT:  Normocephalic, atraumatic.   Neurological examination:  General: NAD, well-groomed, appears stated age. Orientation: The patient is alert. Oriented to person, place and not to date Cranial nerves: There is good facial symmetry.The speech is fluent and clear. No aphasia or dysarthria. Fund of knowledge is appropriate. Recent and remote memory are impaired. Attention and concentration are reduced.  Able to name objects and repeat phrases.  Hearing is intact to conversational tone.   Sensation: Sensation is intact to light touch throughout Motor: Strength is at least antigravity x4. DTR's 2/4 in UE/LE     Movement examination: Tone: There is normal tone in the UE/LE Abnormal movements: minimal R hand intention tremor.  No myoclonus.  No asterixis.   Coordination:  There is no decremation with RAM's. Normal finger to nose  Gait and Station: The patient has no  difficulty arising out of a deep-seated chair without the use of the hands. The patient's stride length is short.  Gait is cautious and slightly broad-based.    Thank you for allowing [place] the opportunity to participate in the care of this nice patient. Please do not hesitate to contact us for any questions or concerns.   Total time spent on today's visit was 25 minutes dedicated to this patient today, preparing to see patient, examining the patient, ordering tests and/or medications and counseling the patient, documenting clinical information in the EHR or other health record, independently interpreting results and communicating results to the patient/family, discussing treatment and goals, answering patient's questions and coordinating care.  Cc:  [facility]  Marlowe Kays 09/11/2023 12:31 PM

## 2023-09-11 NOTE — Patient Instructions (Signed)
 Always good to see you!     Increase activity and brain games   Continue Lexapro  5 mg at night   Resume the CPAP  3. Follow-up in 6 months, call for any changes.   FALL PRECAUTIONS: Be cautious when walking. Scan the area for obstacles that may increase the risk of trips and falls. When getting up in the mornings, sit up at the edge of the bed for a few minutes before getting out of bed. Consider elevating the bed at the head end to avoid drop of blood pressure when getting up. Walk always in a well-lit room (use night lights in the walls). Avoid area rugs or power cords from appliances in the middle of the walkways. Use a walker or a cane if necessary and consider physical therapy for balance exercise. Get your eyesight checked regularly.  HOME SAFETY: Consider the safety of the kitchen when operating appliances like stoves, microwave oven, and blender. Consider having supervision and share cooking responsibilities until no longer able to participate in those. Accidents with firearms and other hazards in the house should be identified and addressed as well.  DRIVING: Regarding driving, in patients with progressive memory problems, driving will be impaired. We advise to have someone else do the driving if trouble finding directions or if minor accidents are reported. Independent driving assessment is available to determine safety of driving.  ABILITY TO BE LEFT ALONE: If patient is unable to contact 911 operator, consider using LifeLine, or when the need is there, arrange for someone to stay with patients. Smoking is a fire hazard, consider supervision or cessation. Risk of wandering should be assessed by caregiver and if detected at any point, supervision and safe proof recommendations should be instituted.  MEDICATION SUPERVISION: Inability to self-administer medication needs to be constantly addressed. Implement a mechanism to ensure safe administration of the medications.  RECOMMENDATIONS  FOR ALL PATIENTS WITH MEMORY PROBLEMS: 1. Continue to exercise (Recommend 30 minutes of walking everyday, or 3 hours every week) 2. Increase social interactions - continue going to [place] and enjoy social gatherings with friends and family 3. Eat healthy, avoid fried foods and eat more fruits and vegetables 4. Maintain adequate blood pressure, blood sugar, and blood cholesterol level. Reducing the risk of stroke and cardiovascular disease also helps promoting better memory. 5. Avoid stressful situations. Live a simple life and avoid aggravations. Organize your time and prepare for the next day in anticipation. 6. Sleep well, avoid any interruptions of sleep and avoid any distractions in the bedroom that may interfere with adequate sleep quality 7. Avoid sugar, avoid sweets as there is a strong link between excessive sugar intake, diabetes, and cognitive impairment The Mediterranean diet has been shown to help patients reduce the risk of progressive memory disorders and reduces cardiovascular risk. This includes eating fish, eat fruits and green leafy vegetables, nuts like almonds and hazelnuts, walnuts, and also use olive oil. Avoid fast foods and fried foods as much as possible. Avoid sweets and sugar as sugar use has been linked to worsening of memory function.  There is always a concern of gradual progression of memory problems. If this is the case, then we may need to adjust level of care according to patient needs. Support, both to the patient and caregiver, should then be put into place.

## 2023-09-14 NOTE — Progress Notes (Unsigned)
 Cardiology Office Note:  .   Date:  09/17/2023  ID:  Darrell Perez, DOB 10-26-1940, MRN [id number] PCP: Darrow Bussing, MD  [facility] Providers Cardiologist:  None {   History of Present Illness: .    Chief Complaint  Patient presents with   Follow-up    Darrell Perez is a 83 y.o. male with history of atrial flutter, HTN, HLD who presents for follow-up.   History of Present Illness   Darrell Perez "Gerlene Burdock" is an 83 year old male with atrial flutter, hypertension, and hyperlipidemia who presents for follow-up. He is accompanied by his wife.  He has a history of atrial flutter, initially diagnosed in the setting of sepsis approximately in 2018. There has been no recurrence of the arrhythmia, and he is not on anticoagulation therapy. No rapid heartbeats or trouble breathing.  He was evaluated last year for chest pain, which was determined to be likely non-cardiac in nature. A stress test conducted at that time showed no ischemic changes, although there was some artifact noted. An echocardiogram was also performed and was normal. He is not currently experiencing any chest discomfort and believes the previous episodes might have been related to acid reflux. No chest pain, rapid heartbeats, or trouble breathing.  His blood pressure is currently well-controlled at 130/62 mmHg. He is taking indapamide 2.5 mg every evening and diltiazem 240 mg daily. He remains somewhat active by doing things around the house.  Cholesterol levels are considered acceptable given his age, and no lipid-lowering medication is currently prescribed.  He mentions having a good year without significant medical issues. He has not had any falls recently but acknowledges the importance of being careful, especially at night.          Problem List Atrial flutter  -2/2 sepsis 02/2017 -> no recurrence  2. HTN 3. HLD -T chol 174, HLD 35, TG 270, LDL 93    ROS: All other ROS reviewed and negative.  Pertinent positives noted in the HPI.     Studies Reviewed: .       NM Stress 09/02/2022   The study is normal. The study is low risk.   No ST deviation was noted.   LV perfusion is abnormal. There is no evidence of ischemia. There is no evidence of infarction. Defect 1: There is a medium defect with moderate reduction in uptake present in the apical to basal inferior location(s) that is fixed. There is normal wall motion in the defect area. Consistent with artifact caused by diaphragmatic attenuation.   Left ventricular function is normal. Nuclear stress EF: 62 %. The left ventricular ejection fraction is normal (55-65%). End diastolic cavity size is normal. End systolic cavity size is normal.  TTE 09/24/2022  1. Left ventricular ejection fraction, by estimation, is 60 to 65%. The  left ventricle has normal function. The left ventricle has no regional  wall motion abnormalities. Left ventricular diastolic parameters are  consistent with Grade I diastolic  dysfunction (impaired relaxation).   2. Right ventricular systolic function is normal. The right ventricular  size is normal. There is normal pulmonary artery systolic pressure. The  estimated right ventricular systolic pressure is 17.7 mmHg.   3. Left atrial size was mildly dilated.   4. The mitral valve is normal in structure. Trivial mitral valve  regurgitation.   5. The aortic valve is tricuspid. There is mild calcification of the  aortic valve. There is mild thickening of the aortic valve. Aortic valve  regurgitation is mild. Aortic valve sclerosis/calcification is present,  without any evidence of aortic  stenosis.   6. Aortic dilatation noted. There is borderline dilatation of the  ascending aorta, measuring 38 mm.   7. The inferior vena cava is normal in size with greater than 50%  respiratory variability, suggesting right atrial pressure of 3 mmHg.   Physical Exam:   VS:  BP 130/62 (BP Location: Left Arm, Patient Position:  Sitting, Cuff Size: Normal)   Pulse (!) 57   Ht 5\' 9"  (1.753 m)   Wt 186 lb (84.4 kg)   SpO2 96%   BMI 27.47 kg/m    Wt Readings from Last 3 Encounters:  09/17/23 186 lb (84.4 kg)  09/11/23 183 lb (83 kg)  09/11/22 192 lb (87.1 kg)    GEN: Well nourished, well developed in no acute distress NECK: No JVD; No carotid bruits CARDIAC: RRR, no murmurs, rubs, gallops RESPIRATORY:  Clear to auscultation without rales, wheezing or rhonchi  ABDOMEN: Soft, non-tender, non-distended EXTREMITIES:  trace edema  ASSESSMENT AND PLAN: .   Assessment and Plan    Atrial Flutter Atrial flutter resolved, no recurrence for eight years. No anticoagulation needed as it was sepsis-related. - Continue diltiazem 240 mg daily. - Seek evaluation if arrhythmia symptoms occur.  Chest Pain Chest pain evaluated as non-cardiac. Normal stress test and echocardiogram. Suspected acid reflux-related. - Seek evaluation if chest pain recurs.  Hypertension Blood pressure well-controlled at 130/60 mmHg. - Continue current antihypertensive regimen. - Regular blood pressure monitoring.  Hyperlipidemia Cholesterol levels acceptable for age.  Peripheral Edema Mild peripheral edema likely due to venous insufficiency. No medication needed. - Advise leg elevation. - Monitor for worsening edema.  Follow-up Overall well-being with no significant cardiac issues. - Follow up with cardiology as needed based on symptoms or primary care physician's recommendations.              Follow-up: Return if symptoms worsen or fail to improve.  Signed, Lenna Gilford. Flora Lipps, MD, [facility]  [facility]  [address] [place], [place] [address] [phone]  9:20 AM

## 2023-09-17 ENCOUNTER — Ambulatory Visit: Payer: Medicare HMO | Attending: Cardiovascular Disease | Admitting: Cardiovascular Disease

## 2023-09-17 ENCOUNTER — Encounter: Payer: Self-pay | Admitting: Cardiovascular Disease

## 2023-09-17 VITALS — BP 130/62 | HR 57 | Ht 69.0 in | Wt 186.0 lb

## 2023-09-17 DIAGNOSIS — I483 Typical atrial flutter: Secondary | ICD-10-CM

## 2023-09-17 DIAGNOSIS — E782 Mixed hyperlipidemia: Secondary | ICD-10-CM | POA: Diagnosis not present

## 2023-09-17 DIAGNOSIS — I10 Essential (primary) hypertension: Secondary | ICD-10-CM

## 2023-09-17 NOTE — Patient Instructions (Signed)
 Medication Instructions:  Your physician recommends that you continue on your current medications as directed. Please refer to the Current Medication list given to you today.    *If you need a refill on your cardiac medications before your next appointment, please call your pharmacy*   Lab Work: NONE    If you have labs (blood work) drawn today and your tests are completely normal, you will receive your results only by: MyChart Message (if you have MyChart) OR A paper copy in the mail If you have any lab test that is abnormal or we need to change your treatment, we will call you to review the results.   Testing/Procedures: NONE    Follow-Up: At [facility], you and your health needs are our priority.  As part of our continuing mission to provide you with exceptional heart care, we have created designated Provider Care Teams.  These Care Teams include your primary Cardiologist (physician) and Advanced Practice Providers (APPs -  Physician Assistants and Nurse Practitioners) who all work together to provide you with the care you need, when you need it.  We recommend signing up for the patient portal called "MyChart".  Sign up information is provided on this After Visit Summary.  MyChart is used to connect with patients for Virtual Visits (Telemedicine).  Patients are able to view lab/test results, encounter notes, upcoming appointments, etc.  Non-urgent messages can be sent to your provider as well.   To learn more about what you can do with MyChart, go to [email or web address].    Your next appointment:   Follow up as needed   Provider:   Lennie Odor, MD     Other Instructions

## 2023-09-21 DIAGNOSIS — Z08 Encounter for follow-up examination after completed treatment for malignant neoplasm: Secondary | ICD-10-CM | POA: Diagnosis not present

## 2023-09-21 DIAGNOSIS — L570 Actinic keratosis: Secondary | ICD-10-CM | POA: Diagnosis not present

## 2023-09-21 DIAGNOSIS — L821 Other seborrheic keratosis: Secondary | ICD-10-CM | POA: Diagnosis not present

## 2023-09-21 DIAGNOSIS — Z85828 Personal history of other malignant neoplasm of skin: Secondary | ICD-10-CM | POA: Diagnosis not present

## 2023-09-21 DIAGNOSIS — L820 Inflamed seborrheic keratosis: Secondary | ICD-10-CM | POA: Diagnosis not present

## 2023-10-08 DIAGNOSIS — N200 Calculus of kidney: Secondary | ICD-10-CM | POA: Diagnosis not present

## 2023-10-08 DIAGNOSIS — R351 Nocturia: Secondary | ICD-10-CM | POA: Diagnosis not present

## 2023-10-08 DIAGNOSIS — N401 Enlarged prostate with lower urinary tract symptoms: Secondary | ICD-10-CM | POA: Diagnosis not present

## 2023-10-24 DIAGNOSIS — W57XXXA Bitten or stung by nonvenomous insect and other nonvenomous arthropods, initial encounter: Secondary | ICD-10-CM | POA: Diagnosis not present

## 2023-10-24 DIAGNOSIS — L0390 Cellulitis, unspecified: Secondary | ICD-10-CM | POA: Diagnosis not present

## 2023-12-15 DIAGNOSIS — H5203 Hypermetropia, bilateral: Secondary | ICD-10-CM | POA: Diagnosis not present

## 2023-12-28 DIAGNOSIS — G301 Alzheimer's disease with late onset: Secondary | ICD-10-CM | POA: Diagnosis not present

## 2023-12-28 DIAGNOSIS — F0390 Unspecified dementia without behavioral disturbance: Secondary | ICD-10-CM | POA: Diagnosis not present

## 2023-12-28 DIAGNOSIS — N401 Enlarged prostate with lower urinary tract symptoms: Secondary | ICD-10-CM | POA: Diagnosis not present

## 2023-12-28 DIAGNOSIS — N1831 Chronic kidney disease, stage 3a: Secondary | ICD-10-CM | POA: Diagnosis not present

## 2024-01-28 DIAGNOSIS — G301 Alzheimer's disease with late onset: Secondary | ICD-10-CM | POA: Diagnosis not present

## 2024-01-28 DIAGNOSIS — N1831 Chronic kidney disease, stage 3a: Secondary | ICD-10-CM | POA: Diagnosis not present

## 2024-01-28 DIAGNOSIS — F0390 Unspecified dementia without behavioral disturbance: Secondary | ICD-10-CM | POA: Diagnosis not present

## 2024-01-28 DIAGNOSIS — N401 Enlarged prostate with lower urinary tract symptoms: Secondary | ICD-10-CM | POA: Diagnosis not present

## 2024-01-29 DIAGNOSIS — I4892 Unspecified atrial flutter: Secondary | ICD-10-CM | POA: Diagnosis not present

## 2024-01-29 DIAGNOSIS — U071 COVID-19: Secondary | ICD-10-CM | POA: Diagnosis not present

## 2024-01-29 DIAGNOSIS — M25562 Pain in left knee: Secondary | ICD-10-CM | POA: Diagnosis not present

## 2024-01-29 DIAGNOSIS — M545 Low back pain, unspecified: Secondary | ICD-10-CM | POA: Diagnosis not present

## 2024-01-29 DIAGNOSIS — I129 Hypertensive chronic kidney disease with stage 1 through stage 4 chronic kidney disease, or unspecified chronic kidney disease: Secondary | ICD-10-CM | POA: Diagnosis not present

## 2024-01-29 DIAGNOSIS — R7309 Other abnormal glucose: Secondary | ICD-10-CM | POA: Diagnosis not present

## 2024-01-29 DIAGNOSIS — N1831 Chronic kidney disease, stage 3a: Secondary | ICD-10-CM | POA: Diagnosis not present

## 2024-01-29 DIAGNOSIS — I700 Atherosclerosis of aorta: Secondary | ICD-10-CM | POA: Diagnosis not present

## 2024-01-29 DIAGNOSIS — K219 Gastro-esophageal reflux disease without esophagitis: Secondary | ICD-10-CM | POA: Diagnosis not present

## 2024-01-29 DIAGNOSIS — G301 Alzheimer's disease with late onset: Secondary | ICD-10-CM | POA: Diagnosis not present

## 2024-01-29 DIAGNOSIS — G4730 Sleep apnea, unspecified: Secondary | ICD-10-CM | POA: Diagnosis not present

## 2024-02-28 DIAGNOSIS — N1831 Chronic kidney disease, stage 3a: Secondary | ICD-10-CM | POA: Diagnosis not present

## 2024-02-28 DIAGNOSIS — F0390 Unspecified dementia without behavioral disturbance: Secondary | ICD-10-CM | POA: Diagnosis not present

## 2024-02-28 DIAGNOSIS — G301 Alzheimer's disease with late onset: Secondary | ICD-10-CM | POA: Diagnosis not present

## 2024-02-28 DIAGNOSIS — N401 Enlarged prostate with lower urinary tract symptoms: Secondary | ICD-10-CM | POA: Diagnosis not present

## 2024-03-14 ENCOUNTER — Ambulatory Visit: Admitting: Physician Assistant

## 2024-03-16 DIAGNOSIS — I10 Essential (primary) hypertension: Secondary | ICD-10-CM | POA: Diagnosis not present

## 2024-03-16 DIAGNOSIS — R29898 Other symptoms and signs involving the musculoskeletal system: Secondary | ICD-10-CM | POA: Diagnosis not present

## 2024-03-16 DIAGNOSIS — Z79899 Other long term (current) drug therapy: Secondary | ICD-10-CM | POA: Diagnosis not present

## 2024-03-16 DIAGNOSIS — N1831 Chronic kidney disease, stage 3a: Secondary | ICD-10-CM | POA: Diagnosis not present

## 2024-03-24 DIAGNOSIS — L820 Inflamed seborrheic keratosis: Secondary | ICD-10-CM | POA: Diagnosis not present

## 2024-03-24 DIAGNOSIS — D0439 Carcinoma in situ of skin of other parts of face: Secondary | ICD-10-CM | POA: Diagnosis not present

## 2024-03-24 DIAGNOSIS — Z85828 Personal history of other malignant neoplasm of skin: Secondary | ICD-10-CM | POA: Diagnosis not present

## 2024-03-24 DIAGNOSIS — L570 Actinic keratosis: Secondary | ICD-10-CM | POA: Diagnosis not present

## 2024-03-24 DIAGNOSIS — D492 Neoplasm of unspecified behavior of bone, soft tissue, and skin: Secondary | ICD-10-CM | POA: Diagnosis not present

## 2024-03-24 DIAGNOSIS — L2989 Other pruritus: Secondary | ICD-10-CM | POA: Diagnosis not present

## 2024-03-24 DIAGNOSIS — L821 Other seborrheic keratosis: Secondary | ICD-10-CM | POA: Diagnosis not present

## 2024-03-24 DIAGNOSIS — Z08 Encounter for follow-up examination after completed treatment for malignant neoplasm: Secondary | ICD-10-CM | POA: Diagnosis not present

## 2024-03-24 DIAGNOSIS — L814 Other melanin hyperpigmentation: Secondary | ICD-10-CM | POA: Diagnosis not present

## 2024-03-24 DIAGNOSIS — D225 Melanocytic nevi of trunk: Secondary | ICD-10-CM | POA: Diagnosis not present

## 2024-03-29 DIAGNOSIS — G4730 Sleep apnea, unspecified: Secondary | ICD-10-CM | POA: Diagnosis not present

## 2024-03-29 DIAGNOSIS — I4892 Unspecified atrial flutter: Secondary | ICD-10-CM | POA: Diagnosis not present

## 2024-03-29 DIAGNOSIS — K219 Gastro-esophageal reflux disease without esophagitis: Secondary | ICD-10-CM | POA: Diagnosis not present

## 2024-03-29 DIAGNOSIS — N401 Enlarged prostate with lower urinary tract symptoms: Secondary | ICD-10-CM | POA: Diagnosis not present

## 2024-03-29 DIAGNOSIS — I700 Atherosclerosis of aorta: Secondary | ICD-10-CM | POA: Diagnosis not present

## 2024-03-29 DIAGNOSIS — N1831 Chronic kidney disease, stage 3a: Secondary | ICD-10-CM | POA: Diagnosis not present

## 2024-03-29 DIAGNOSIS — F0390 Unspecified dementia without behavioral disturbance: Secondary | ICD-10-CM | POA: Diagnosis not present

## 2024-03-29 DIAGNOSIS — M25562 Pain in left knee: Secondary | ICD-10-CM | POA: Diagnosis not present

## 2024-03-29 DIAGNOSIS — G301 Alzheimer's disease with late onset: Secondary | ICD-10-CM | POA: Diagnosis not present

## 2024-03-29 DIAGNOSIS — M545 Low back pain, unspecified: Secondary | ICD-10-CM | POA: Diagnosis not present

## 2024-03-29 DIAGNOSIS — I129 Hypertensive chronic kidney disease with stage 1 through stage 4 chronic kidney disease, or unspecified chronic kidney disease: Secondary | ICD-10-CM | POA: Diagnosis not present

## 2024-03-29 DIAGNOSIS — R7303 Prediabetes: Secondary | ICD-10-CM | POA: Diagnosis not present

## 2024-04-01 NOTE — Progress Notes (Signed)
 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 RH male with a history of hypertension, goiter s/p thyroid  lobectomy, atrial fibrillation, iron deficiency anemia, with mild dementia, likely due to Alzheimer's disease seen today in follow up for memory loss. Patient is known to noted dementia medication as he was unable to tolerate  memantine   or donepezil. He states that he stopped galantamine  due to weird dreams. HE is not using the CPAP, wonder if this can contribute with his symptoms. We discuss the importance of CPAP, if he stops having weird dreams, we may suspect this was the culprit,  and he could try at least half dose. MMSE today is 22/30, some decline from prior. Patient is able to participate on ADLs and to drive without significant difficulties.  Mood is well controled with Lexapro  5 mg daily.     Follow up in  6 months. Recommend good control of her cardiovascular risk factors Continue to control mood as per PCP on Lexapro   Recommend using nightly use of CPAP regularly for OSA     Subjective:    This patient is accompanied in the office by his wife who supplements the history.  Previous records as well as any outside records available were reviewed prior to todays visit. Patient was last seen on 09/11/2023 with MMSE 27/30    Any changes in memory since last visit? I feel better for the last 6 months after quitting taking the pill .  Difficulty with short-term memory especially with conversations and names.  Wife keeps a calendar to help him remember events.  He enjoys reading, doing crossword puzzles. repeats oneself?  Endorsed especially with appointments, but not too often Disoriented when walking into a room? Denies    Leaving objects?  May misplace things but not in unusual places, tries to leave he stuff in one room  so that he does not forget.   Wandering behavior?  denies   Any personality changes  since last visit?  Denies.   Any worsening depression?:  Denies.   Hallucinations or paranoia?  Denies.   Seizures? denies    Any sleep changes?  Sleeps well.  Denies vivid dreams, REM behavior or sleepwalking   Sleep apnea?  Endorsed, uses CPAP (although not frequently) Any hygiene concerns? Denies.  Independent of bathing and dressing?  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  Patient is in charge   Who is in charge of the finances?  Wife is in charge     Any changes in appetite?  denies     Patient have trouble swallowing? Denies.   Does the patient cook? No Any headaches?   denies   Any vision changes?  Chronic back pain  denies   Ambulates with difficulty? Denies.    Recent falls or head injuries? Denies.     Unilateral weakness, numbness or tingling? denies   Any tremors?  Denies, not today, they may come and go Any anosmia?  Denies   Any incontinence of urine?  He has a history of urinary urgency followed by urology   Any bowel dysfunction?   Denies       Patient lives with his wife  Does the patient drive?  Yes, very short distances, not very often.[name]       History on Initial Assessment: This is a pleasant 83 year old right-handed man with a history of hypertension,  goiter s/p thyroid  lobectomy, atrial fibrillation, dementia, presenting to establish care. He feels his memory is pretty good. His wife started noticing memory changes around 5 years ago where he was getting more forgetful, but worse in the past 1-2 years. He had sepsis in the Fall and things seemed to accelerate then. He had more difficulties initially when he got home, but has acclimated since then. His wife has taken over most of the driving after he made her nervous trying to turn left onto oncoming traffic 1.5 years ago and did not seem to comprehend this was difficult. He continues to manage his own medications. There have been a few bills that he has let go too  long without paying, which is unusual. He has word-finding difficulties. His wife denies any personality changes, no paranoia or hallucinations. He had abnormal dreams on Aricept and is taking Galantamine , however this is quite costly for their budget and his wife wonders if there is any true benefit from it. No side effects on galantamine .  He has low back pain and both legs feel weak. Otherwise he denies any headaches, dizziness, vision changes, dysarthria/dysphagia, neck pain, focal numbness/tingling, bowel/bladder dysfunction, anosmia, or tremors. Sleep is good. No falls. Both parents had dementia. He denies any history of significant head injuries or alcohol use.    I personally reviewed MRI brain with and without contrast done 10/2015 which did not show any acute changes. There was mild dilatation of the lateral ventricles with sparing of the temporal horns, mild diffuse atrophy and chronic microvascular disease.     PREVIOUS MEDICATIONS: Donepezil and memantine , galantamine  (weird dreams, hallucinations)    CURRENT MEDICATIONS:  Outpatient Encounter Medications as of 04/04/2024  Medication Sig   acetaminophen  (TYLENOL ) 500 MG tablet Take by mouth every 6 (six) hours as needed for mild pain, moderate pain, fever or headache.    allopurinol  (ZYLOPRIM ) 100 MG tablet Take 100 mg by mouth every morning.    cetirizine (ZYRTEC) 10 MG tablet 1 tablet   cholecalciferol  (VITAMIN D) 1000 UNITS tablet Take 1,000 Units by mouth daily.   diltiazem  (CARDIZEM  CD) 240 MG 24 hr capsule Take 1 capsule (240 mg total) by mouth every morning. Patient needs appointment (Patient taking differently: Take 240 mg by mouth every morning. Patient needs appointment Pt takes BID)   escitalopram  (LEXAPRO ) 5 MG tablet Take 1 tablet (5 mg total) by mouth at bedtime.   HYDROcodone -acetaminophen  (NORCO/VICODIN) 5-325 MG tablet Take 1 tablet by mouth every 6 (six) hours as needed for moderate pain.   hyoscyamine  (ANASPAZ ) 0.125  MG TBDP disintergrating tablet Place 0.125 mg under the tongue every 6 (six) hours as needed for cramping.   indapamide  (LOZOL ) 2.5 MG tablet Take 2.5 mg by mouth every evening.    loperamide  (IMODIUM ) 2 MG capsule Take 1-2 mg by mouth as needed.   pantoprazole  (PROTONIX ) 40 MG tablet Take 1 tablet by mouth daily.   polyethylene glycol (MIRALAX  / GLYCOLAX ) packet Take 17 g by mouth daily.   potassium chloride  SA (K-DUR,KLOR-CON ) 20 MEQ tablet Take 20 mEq by mouth 2 (two) times daily.   saccharomyces boulardii (FLORASTOR) 250 MG capsule 1 capsule   tolterodine (DETROL LA) 4 MG 24 hr capsule Take by mouth daily.   valACYclovir  (VALTREX ) 1000 MG tablet Take 1 tablet (1,000 mg total) by mouth 3 (three) times daily.   vitamin B-12 (CYANOCOBALAMIN ) 1000 MCG tablet Take 1,000 mcg by mouth every morning.    [DISCONTINUED] galantamine  (RAZADYNE ) 12 MG tablet Take  1 tablet (12 mg total) by mouth 2 (two) times daily.   No facility-administered encounter medications on file as of 04/04/2024.       04/04/2024   10:00 AM 09/11/2023   12:00 PM 03/13/2023    1:00 PM  MMSE - Mini Mental State Exam  Orientation to time 3 4 4   Orientation to Place 5 5 5   Registration 3 3 3   Attention/ Calculation 3 5 4   Recall 0 1 1  Language- name 2 objects 2 2 2   Language- repeat 1 1 1   Language- follow 3 step command 3 3 3   Language- read & follow direction 1 1 1   Write a sentence 0 1 1  Copy design 1 1 1   Total score 22 27 26       04/07/2019    7:00 AM 09/01/2018    8:00 AM  Montreal Cognitive Assessment   Visuospatial/ Executive (0/5)  3  Naming (0/3)  2  Attention: Read list of digits (0/2) 2 1  Attention: Read list of letters (0/1) 1 1  Attention: Serial 7 subtraction starting at 100 (0/3) 2 2  Language: Repeat phrase (0/2) 1 0  Language : Fluency (0/1) 1 0  Abstraction (0/2) 2 2  Delayed Recall (0/5) 1 1  Orientation (0/6) 5 6  Total  18    Objective:     PHYSICAL EXAMINATION:    VITALS:    Vitals:   04/04/24 0949  BP: (!) 143/72  Pulse: 61  Resp: 20  SpO2: 95%  Weight: 187 lb (84.8 kg)  Height: 5' 9 (1.753 m)    GEN:  The patient appears stated age and is in NAD. HEENT:  Normocephalic, atraumatic.   Neurological examination:  General: NAD, well-groomed, appears stated age. Orientation: The patient is alert. Oriented to person, place and not to date Cranial nerves: There is good facial symmetry.The speech is fluent and clear. No aphasia or dysarthria. Fund of knowledge is  reduced. Recent and remote memory are impaired. Attention and concentration are reduced. Able to name objects and repeat phrases.  Hearing is intact to conversational tone.   Sensation: Sensation is intact to light touch throughout Motor: Strength is at least antigravity x4. DTR's 2/4 in UE/LE     Movement examination: Tone: There is normal tone in the UE/LE Abnormal movements:  no tremors   No myoclonus.  No asterixis.   Coordination:  There is no decremation with RAM's. Normal finger to nose  Gait and Station: The patient has no difficulty arising out of a deep-seated chair without the use of the hands. The patient's stride length is short.  Gait is cautious and narrow.   Thank you for allowing us  the opportunity to participate in the care of this nice patient. Please do not hesitate to contact us  for any questions or concerns.   Total time spent on today's visit was 23 minutes dedicated to this patient today, preparing to see patient, examining the patient, ordering tests and/or medications and counseling the patient, documenting clinical information in the EHR or other health record, independently interpreting results and communicating results to the patient/family, discussing treatment and goals, answering patient's questions and coordinating care.  Cc:  Regino Slater, MD  Camie Sevin 04/04/2024 10:34 AM

## 2024-04-04 ENCOUNTER — Encounter: Payer: Self-pay | Admitting: Physician Assistant

## 2024-04-04 ENCOUNTER — Ambulatory Visit: Admitting: Physician Assistant

## 2024-04-04 VITALS — BP 143/72 | HR 61 | Resp 20 | Ht 69.0 in | Wt 187.0 lb

## 2024-04-04 DIAGNOSIS — F02A0 Dementia in other diseases classified elsewhere, mild, without behavioral disturbance, psychotic disturbance, mood disturbance, and anxiety: Secondary | ICD-10-CM | POA: Diagnosis not present

## 2024-04-04 DIAGNOSIS — G301 Alzheimer's disease with late onset: Secondary | ICD-10-CM

## 2024-04-04 NOTE — Patient Instructions (Signed)
 Always good to see you!     Increase activity and brain games   Continue Lexapro  5 mg at night   Resume the CPAP  3. Follow-up in 6 months, call for any changes.   FALL PRECAUTIONS: Be cautious when walking. Scan the area for obstacles that may increase the risk of trips and falls. When getting up in the mornings, sit up at the edge of the bed for a few minutes before getting out of bed. Consider elevating the bed at the head end to avoid drop of blood pressure when getting up. Walk always in a well-lit room (use night lights in the walls). Avoid area rugs or power cords from appliances in the middle of the walkways. Use a walker or a cane if necessary and consider physical therapy for balance exercise. Get your eyesight checked regularly.  HOME SAFETY: Consider the safety of the kitchen when operating appliances like stoves, microwave oven, and blender. Consider having supervision and share cooking responsibilities until no longer able to participate in those. Accidents with firearms and other hazards in the house should be identified and addressed as well.  DRIVING: Regarding driving, in patients with progressive memory problems, driving will be impaired. We advise to have someone else do the driving if trouble finding directions or if minor accidents are reported. Independent driving assessment is available to determine safety of driving.  ABILITY TO BE LEFT ALONE: If patient is unable to contact 911 operator, consider using LifeLine, or when the need is there, arrange for someone to stay with patients. Smoking is a fire hazard, consider supervision or cessation. Risk of wandering should be assessed by caregiver and if detected at any point, supervision and safe proof recommendations should be instituted.  MEDICATION SUPERVISION: Inability to self-administer medication needs to be constantly addressed. Implement a mechanism to ensure safe administration of the medications.  RECOMMENDATIONS  FOR ALL PATIENTS WITH MEMORY PROBLEMS: 1. Continue to exercise (Recommend 30 minutes of walking everyday, or 3 hours every week) 2. Increase social interactions - continue going to [place] and enjoy social gatherings with friends and family 3. Eat healthy, avoid fried foods and eat more fruits and vegetables 4. Maintain adequate blood pressure, blood sugar, and blood cholesterol level. Reducing the risk of stroke and cardiovascular disease also helps promoting better memory. 5. Avoid stressful situations. Live a simple life and avoid aggravations. Organize your time and prepare for the next day in anticipation. 6. Sleep well, avoid any interruptions of sleep and avoid any distractions in the bedroom that may interfere with adequate sleep quality 7. Avoid sugar, avoid sweets as there is a strong link between excessive sugar intake, diabetes, and cognitive impairment The Mediterranean diet has been shown to help patients reduce the risk of progressive memory disorders and reduces cardiovascular risk. This includes eating fish, eat fruits and green leafy vegetables, nuts like almonds and hazelnuts, walnuts, and also use olive oil. Avoid fast foods and fried foods as much as possible. Avoid sweets and sugar as sugar use has been linked to worsening of memory function.  There is always a concern of gradual progression of memory problems. If this is the case, then we may need to adjust level of care according to patient needs. Support, both to the patient and caregiver, should then be put into place.

## 2024-04-06 DIAGNOSIS — D0439 Carcinoma in situ of skin of other parts of face: Secondary | ICD-10-CM | POA: Diagnosis not present

## 2024-04-29 DIAGNOSIS — N1831 Chronic kidney disease, stage 3a: Secondary | ICD-10-CM | POA: Diagnosis not present

## 2024-04-29 DIAGNOSIS — G301 Alzheimer's disease with late onset: Secondary | ICD-10-CM | POA: Diagnosis not present

## 2024-04-29 DIAGNOSIS — N401 Enlarged prostate with lower urinary tract symptoms: Secondary | ICD-10-CM | POA: Diagnosis not present

## 2024-04-29 DIAGNOSIS — F0390 Unspecified dementia without behavioral disturbance: Secondary | ICD-10-CM | POA: Diagnosis not present

## 2024-05-29 DIAGNOSIS — N1831 Chronic kidney disease, stage 3a: Secondary | ICD-10-CM | POA: Diagnosis not present

## 2024-05-29 DIAGNOSIS — F0390 Unspecified dementia without behavioral disturbance: Secondary | ICD-10-CM | POA: Diagnosis not present

## 2024-05-29 DIAGNOSIS — G301 Alzheimer's disease with late onset: Secondary | ICD-10-CM | POA: Diagnosis not present

## 2024-05-29 DIAGNOSIS — N401 Enlarged prostate with lower urinary tract symptoms: Secondary | ICD-10-CM | POA: Diagnosis not present

## 2024-06-20 NOTE — Progress Notes (Addendum)
 Darrell Perez                                          MRN: [id number]   07/12/2024   The VBCI Quality Team Specialist reviewed this patient medical record for the purposes of chart review for care gap closure. The following were reviewed: chart review for care gap closure-controlling blood pressure.    VBCI Quality Team

## 2024-06-22 ENCOUNTER — Emergency Department (HOSPITAL_COMMUNITY)

## 2024-06-22 ENCOUNTER — Observation Stay (HOSPITAL_COMMUNITY)
Admission: EM | Admit: 2024-06-22 | Discharge: 2024-06-23 | Disposition: A | Discharge status: Home / Self Care | Attending: Internal Medicine | Admitting: Internal Medicine

## 2024-06-22 ENCOUNTER — Other Ambulatory Visit: Payer: Self-pay

## 2024-06-22 ENCOUNTER — Encounter (HOSPITAL_COMMUNITY): Payer: Self-pay | Admitting: Emergency Medicine

## 2024-06-22 DIAGNOSIS — M109 Gout, unspecified: Secondary | ICD-10-CM | POA: Diagnosis not present

## 2024-06-22 DIAGNOSIS — I10 Essential (primary) hypertension: Secondary | ICD-10-CM | POA: Insufficient documentation

## 2024-06-22 DIAGNOSIS — J101 Influenza due to other identified influenza virus with other respiratory manifestations: Principal | ICD-10-CM | POA: Insufficient documentation

## 2024-06-22 DIAGNOSIS — I4892 Unspecified atrial flutter: Secondary | ICD-10-CM | POA: Diagnosis not present

## 2024-06-22 DIAGNOSIS — F39 Unspecified mood [affective] disorder: Secondary | ICD-10-CM | POA: Insufficient documentation

## 2024-06-22 DIAGNOSIS — K219 Gastro-esophageal reflux disease without esophagitis: Secondary | ICD-10-CM | POA: Insufficient documentation

## 2024-06-22 DIAGNOSIS — G4733 Obstructive sleep apnea (adult) (pediatric): Secondary | ICD-10-CM | POA: Diagnosis not present

## 2024-06-22 DIAGNOSIS — N3281 Overactive bladder: Secondary | ICD-10-CM | POA: Diagnosis not present

## 2024-06-22 DIAGNOSIS — R531 Weakness: Secondary | ICD-10-CM

## 2024-06-22 DIAGNOSIS — J45909 Unspecified asthma, uncomplicated: Secondary | ICD-10-CM | POA: Diagnosis not present

## 2024-06-22 DIAGNOSIS — G9341 Metabolic encephalopathy: Secondary | ICD-10-CM | POA: Diagnosis not present

## 2024-06-22 DIAGNOSIS — R509 Fever, unspecified: Principal | ICD-10-CM

## 2024-06-22 DIAGNOSIS — E876 Hypokalemia: Secondary | ICD-10-CM | POA: Diagnosis not present

## 2024-06-22 DIAGNOSIS — R4182 Altered mental status, unspecified: Secondary | ICD-10-CM | POA: Insufficient documentation

## 2024-06-22 LAB — COMPREHENSIVE METABOLIC PANEL WITH GFR
ALT: 27 U/L (ref 0–44)
AST: 37 U/L (ref 15–41)
Albumin: 3.9 g/dL (ref 3.5–5.0)
Alkaline Phosphatase: 62 U/L (ref 38–126)
Anion gap: 10 (ref 5–15)
BUN: 18 mg/dL (ref 8–23)
CO2: 29 mmol/L (ref 22–32)
Calcium: 9 mg/dL (ref 8.9–10.3)
Chloride: 98 mmol/L (ref 98–111)
Creatinine, Ser: 1.13 mg/dL (ref 0.61–1.24)
GFR, Estimated: >60 mL/min
Glucose, Bld: 128 mg/dL — ABNORMAL HIGH (ref 70–99)
Potassium: 3.3 mmol/L — ABNORMAL LOW (ref 3.5–5.1)
Sodium: 137 mmol/L (ref 135–145)
Total Bilirubin: 0.3 mg/dL (ref 0.0–1.2)
Total Protein: 6.6 g/dL (ref 6.5–8.1)

## 2024-06-22 LAB — CBC WITH DIFFERENTIAL/PLATELET
Abs Immature Granulocytes: 0.05 K/uL (ref 0.00–0.07)
Basophils Absolute: 0.1 K/uL (ref 0.0–0.1)
Basophils Relative: 1 %
Eosinophils Absolute: 0.1 K/uL (ref 0.0–0.5)
Eosinophils Relative: 1 %
HCT: 46.1 % (ref 39.0–52.0)
Hemoglobin: 15.8 g/dL (ref 13.0–17.0)
Immature Granulocytes: 1 %
Lymphocytes Relative: 7 %
Lymphs Abs: 0.6 K/uL — ABNORMAL LOW (ref 0.7–4.0)
MCH: 30.4 pg (ref 26.0–34.0)
MCHC: 34.3 g/dL (ref 30.0–36.0)
MCV: 88.8 fL (ref 80.0–100.0)
Monocytes Absolute: 0.8 K/uL (ref 0.1–1.0)
Monocytes Relative: 10 %
Neutro Abs: 6.5 K/uL (ref 1.7–7.7)
Neutrophils Relative %: 80 %
Platelets: 248 K/uL (ref 150–400)
RBC: 5.19 MIL/uL (ref 4.22–5.81)
RDW: 14.3 % (ref 11.5–15.5)
WBC: 8.1 K/uL (ref 4.0–10.5)
nRBC: 0 % (ref 0.0–0.2)

## 2024-06-22 LAB — I-STAT CG4 LACTIC ACID, ED: Lactic Acid, Venous: 2.5 mmol/L (ref 0.5–1.9)

## 2024-06-22 LAB — URINALYSIS, W/ REFLEX TO CULTURE (INFECTION SUSPECTED)
Bilirubin Urine: NEGATIVE
Glucose, UA: NEGATIVE mg/dL
Ketones, ur: NEGATIVE mg/dL
Nitrite: NEGATIVE
Protein, ur: NEGATIVE mg/dL
Specific Gravity, Urine: 1.02 (ref 1.005–1.030)
pH: 5 (ref 5.0–8.0)

## 2024-06-22 MED ORDER — SODIUM CHLORIDE 0.9 % IV SOLN
2.0000 g | Freq: Once | INTRAVENOUS | Status: AC
  Administered 2024-06-22: 2 g via INTRAVENOUS
  Filled 2024-06-22: qty 20

## 2024-06-22 MED ORDER — SODIUM CHLORIDE 0.9 % IV BOLUS
1000.0000 mL | Freq: Once | INTRAVENOUS | Status: AC
  Administered 2024-06-22: 1000 mL via INTRAVENOUS

## 2024-06-22 NOTE — ED Triage Notes (Signed)
 BIB [facility] from home for weakness that started approx 1500 today. Wife reports that pt was sitting in recliner and at 1830 wife attempted to help pt out of recliner with no avail. EMS called for lift assist at 1930. Pt has had generalized weakness with possible fever. Pt baseline is ambulatory with cane. Pt was 92% RA and was placed on 2 lpm via [place] and O2 came up to 97%.   BP 208/102 HR 78 Spo2 97%  2LPM [place] [facility] 107 Temp temporal 99.4

## 2024-06-22 NOTE — ED Notes (Signed)
 Lac is 2.49

## 2024-06-22 NOTE — ED Provider Notes (Signed)
 " [place] [facility] Provider Note   CSN: [id number] Arrival date & time: 06/22/24  2055     Patient presents with: Weakness   Darrell Perez is a 83 y.o. male history of dementia, hypertension here presenting with weakness.  Patient apparently was weak and unable to get out of his recliner since around 3 PM.  EMS was called to help him get up and noticed that he is significantly weak so brought him here for further evaluation.  Patient appears to be warm per EMS.  Patient also had an episode of urinary incontinence.  Patient was given 650 Tylenol  prior to arrival.   The history is provided by the patient.       Prior to Admission medications  Medication Sig Start Date End Date Taking? Authorizing Provider  acetaminophen  (TYLENOL ) 500 MG tablet Take by mouth every 6 (six) hours as needed for mild pain, moderate pain, fever or headache.     [provider]  allopurinol  (ZYLOPRIM ) 100 MG tablet Take 100 mg by mouth every morning.     [provider]  cetirizine (ZYRTEC) 10 MG tablet 1 tablet    [provider]  cholecalciferol  (VITAMIN D) 1000 UNITS tablet Take 1,000 Units by mouth daily.    [provider]  diltiazem  (CARDIZEM  CD) 240 MG 24 hr capsule Take 1 capsule (240 mg total) by mouth every morning. Patient needs appointment Patient taking differently: Take 240 mg by mouth every morning. Patient needs appointment Pt takes BID 06/18/18   Lavona Agent, MD  escitalopram  (LEXAPRO ) 5 MG tablet Take 1 tablet (5 mg total) by mouth at bedtime. 09/11/22   Wertman, Sara E, PA-C  HYDROcodone -acetaminophen  (NORCO/VICODIN) 5-325 MG tablet Take 1 tablet by mouth every 6 (six) hours as needed for moderate pain.    [provider]  hyoscyamine  (ANASPAZ ) 0.125 MG TBDP disintergrating tablet Place 0.125 mg under the tongue every 6 (six) hours as needed for cramping.    [provider]  indapamide  (LOZOL )  2.5 MG tablet Take 2.5 mg by mouth every evening.     [provider]  loperamide  (IMODIUM ) 2 MG capsule Take 1-2 mg by mouth as needed.    [provider]  pantoprazole  (PROTONIX ) 40 MG tablet Take 1 tablet by mouth daily. 07/24/17   [provider]  polyethylene glycol (MIRALAX  / GLYCOLAX ) packet Take 17 g by mouth daily. 03/27/17   Tobie Yetta HERO, MD  potassium chloride  SA (K-DUR,KLOR-CON ) 20 MEQ tablet Take 20 mEq by mouth 2 (two) times daily.    [provider]  saccharomyces boulardii (FLORASTOR) 250 MG capsule 1 capsule    [provider]  tolterodine (DETROL LA) 4 MG 24 hr capsule Take by mouth daily. 08/18/18   [provider]  valACYclovir  (VALTREX ) 1000 MG tablet Take 1 tablet (1,000 mg total) by mouth 3 (three) times daily. 08/19/22   Jerrol Agent, MD  vitamin B-12 (CYANOCOBALAMIN ) 1000 MCG tablet Take 1,000 mcg by mouth every morning.     [provider]    Allergies: Donepezil hcl, Galantamine , Memantine , Memantine  hcl, Naprosyn [naproxen], and Sulfamethoxazole-trimethoprim    Review of Systems  Constitutional:  Positive for fever.  Neurological:  Positive for weakness.  All other systems reviewed and are negative.   Updated Vital Signs BP (!) 173/64   Pulse 72   Temp (!) 102.6 F (39.2 C) (Rectal)   Resp 19   Ht 5' 9 (1.753 m)  Wt 84.8 kg   SpO2 100%   BMI 27.61 kg/m   Physical Exam Vitals and nursing note reviewed.  Constitutional:      Comments: Chronically ill-appearing.  HENT:     Head: Normocephalic.     Nose: Nose normal.     Mouth/Throat:     Mouth: Mucous membranes are moist.  Eyes:     Extraocular Movements: Extraocular movements intact.     Pupils: Pupils are equal, round, and reactive to light.  Cardiovascular:     Rate and Rhythm: Normal rate and regular rhythm.     Pulses: Normal pulses.     Heart sounds: Normal heart sounds.  Pulmonary:     Effort: Pulmonary effort is normal.      Breath sounds: Normal breath sounds.     Comments: No obvious wheezing or crackles Abdominal:     General: Abdomen is flat.     Palpations: Abdomen is soft.  Musculoskeletal:     Cervical back: Normal range of motion and neck supple.     Comments: No obvious sacral decub ulcer  Skin:    General: Skin is warm.     Capillary Refill: Capillary refill takes less than 2 seconds.  Neurological:     Comments: Confused but moving all extremities  Psychiatric:        Mood and Affect: Mood normal.        Behavior: Behavior normal.     (all labs ordered are listed, but only abnormal results are displayed) Labs Reviewed  CULTURE, BLOOD (ROUTINE X 2)  CULTURE, BLOOD (ROUTINE X 2)  CBC WITH DIFFERENTIAL/PLATELET  COMPREHENSIVE METABOLIC PANEL WITH GFR  URINALYSIS, W/ REFLEX TO CULTURE (INFECTION SUSPECTED)  I-STAT CG4 LACTIC ACID, ED    EKG: EKG Interpretation Date/Time:  Wednesday June 22 2024 21:11:34 EST Ventricular Rate:  73 PR Interval:  151 QRS Duration:  104 QT Interval:  396 QTC Calculation: 437 R Axis:   -48  Text Interpretation: Sinus rhythm LAD, consider left anterior fascicular block Abnormal R-wave progression, early transition Probable anteroseptal infarct, old Borderline ST depression, lateral leads Baseline wander in lead(s) V3 No significant change since last tracing Confirmed by Patt Alm DEL [phone]) on 06/22/2024 9:17:54 PM  Radiology: No results found.   Procedures   Medications Ordered in the ED  sodium chloride  0.9 % bolus 1,000 mL (has no administration in time range)                                    Medical Decision Making Darrell Perez is a 83 y.o. male here presenting with fever.  I think likely food versus pneumonia versus UTI.  Plan to get CBC CMP and UA and CT head and chest x-ray.  Will hydrate patient and patient likely need admission for encephalopathy  10:41 PM I reviewed patient's labs and white blood cell count is normal.   Lactate is slightly elevated at 2.5.  However urinalysis chest x-ray is clear.  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  Flu and COVID and RSV test pending.  11:00 PM COVID/flu pending signed out to Dr. Theadore.   Amount and/or Complexity of Data Reviewed Labs: ordered. Radiology: ordered.     Final diagnoses:  None    ED Discharge Orders     None          Patt Alm Macho, MD 06/22/24 2300  "

## 2024-06-23 DIAGNOSIS — G9341 Metabolic encephalopathy: Secondary | ICD-10-CM

## 2024-06-23 DIAGNOSIS — R531 Weakness: Secondary | ICD-10-CM

## 2024-06-23 DIAGNOSIS — J101 Influenza due to other identified influenza virus with other respiratory manifestations: Principal | ICD-10-CM

## 2024-06-23 DIAGNOSIS — E876 Hypokalemia: Secondary | ICD-10-CM

## 2024-06-23 LAB — RESP PANEL BY RT-PCR (RSV, FLU A&B, COVID)  RVPGX2
Influenza A by PCR: POSITIVE — AB
Influenza B by PCR: NEGATIVE
Resp Syncytial Virus by PCR: NEGATIVE
SARS Coronavirus 2 by RT PCR: NEGATIVE

## 2024-06-23 LAB — BASIC METABOLIC PANEL WITH GFR
Anion gap: 12 (ref 5–15)
BUN: 14 mg/dL (ref 8–23)
CO2: 25 mmol/L (ref 22–32)
Calcium: 8.6 mg/dL — ABNORMAL LOW (ref 8.9–10.3)
Chloride: 101 mmol/L (ref 98–111)
Creatinine, Ser: 1.04 mg/dL (ref 0.61–1.24)
GFR, Estimated: >60 mL/min
Glucose, Bld: 94 mg/dL (ref 70–99)
Potassium: 3.7 mmol/L (ref 3.5–5.1)
Sodium: 138 mmol/L (ref 135–145)

## 2024-06-23 LAB — PHOSPHORUS: Phosphorus: 3 mg/dL (ref 2.5–4.6)

## 2024-06-23 LAB — I-STAT CG4 LACTIC ACID, ED: Lactic Acid, Venous: 0.9 mmol/L (ref 0.5–1.9)

## 2024-06-23 LAB — TSH: TSH: 0.858 u[IU]/mL (ref 0.350–4.500)

## 2024-06-23 LAB — MAGNESIUM: Magnesium: 1.6 mg/dL — ABNORMAL LOW (ref 1.7–2.4)

## 2024-06-23 MED ORDER — PANTOPRAZOLE SODIUM 40 MG PO TBEC
40.0000 mg | DELAYED_RELEASE_TABLET | Freq: Every day | ORAL | Status: DC
  Administered 2024-06-23: 40 mg via ORAL
  Filled 2024-06-23: qty 1

## 2024-06-23 MED ORDER — ACETAMINOPHEN 325 MG PO TABS: 650.0000 mg | ORAL_TABLET | Freq: Four times a day (QID) | ORAL | Status: AC | PRN

## 2024-06-23 MED ORDER — ACETAMINOPHEN 650 MG RE SUPP: 650.0000 mg | Freq: Four times a day (QID) | RECTAL | Status: DC | PRN

## 2024-06-23 MED ORDER — FESOTERODINE FUMARATE ER 4 MG PO TB24
4.0000 mg | ORAL_TABLET | Freq: Every day | ORAL | Status: DC
  Administered 2024-06-23: 4 mg via ORAL
  Filled 2024-06-23: qty 1

## 2024-06-23 MED ORDER — ALLOPURINOL 100 MG PO TABS
100.0000 mg | ORAL_TABLET | Freq: Every morning | ORAL | Status: DC
  Administered 2024-06-23: 100 mg via ORAL
  Filled 2024-06-23: qty 1

## 2024-06-23 MED ORDER — HYDRALAZINE HCL 20 MG/ML IJ SOLN: 5.0000 mg | Freq: Four times a day (QID) | INTRAMUSCULAR | Status: DC | PRN

## 2024-06-23 MED ORDER — OSELTAMIVIR PHOSPHATE 30 MG PO CAPS: 30.0000 mg | ORAL_CAPSULE | Freq: Two times a day (BID) | ORAL | 0 refills | Status: AC

## 2024-06-23 MED ORDER — SODIUM CHLORIDE 0.9 % IV SOLN: INTRAVENOUS | Status: DC

## 2024-06-23 MED ORDER — OSELTAMIVIR PHOSPHATE 30 MG PO CAPS
30.0000 mg | ORAL_CAPSULE | Freq: Two times a day (BID) | ORAL | Status: DC
  Filled 2024-06-23: qty 1

## 2024-06-23 MED ORDER — ENOXAPARIN SODIUM 40 MG/0.4ML IJ SOSY
40.0000 mg | PREFILLED_SYRINGE | Freq: Every day | INTRAMUSCULAR | Status: DC
  Administered 2024-06-23: 40 mg via SUBCUTANEOUS
  Filled 2024-06-23: qty 0.4

## 2024-06-23 MED ORDER — POTASSIUM CHLORIDE CRYS ER 20 MEQ PO TBCR
40.0000 meq | EXTENDED_RELEASE_TABLET | Freq: Once | ORAL | Status: AC
  Administered 2024-06-23: 40 meq via ORAL
  Filled 2024-06-23: qty 2

## 2024-06-23 MED ORDER — INDAPAMIDE 2.5 MG PO TABS: 2.5000 mg | ORAL_TABLET | Freq: Every evening | ORAL | Status: DC

## 2024-06-23 MED ORDER — ACETAMINOPHEN 325 MG PO TABS: 650.0000 mg | ORAL_TABLET | Freq: Four times a day (QID) | ORAL | Status: DC | PRN

## 2024-06-23 MED ORDER — ESCITALOPRAM OXALATE 10 MG PO TABS: 5.0000 mg | ORAL_TABLET | Freq: Every day | ORAL | Status: DC

## 2024-06-23 MED ORDER — OSELTAMIVIR PHOSPHATE 75 MG PO CAPS
75.0000 mg | ORAL_CAPSULE | Freq: Once | ORAL | Status: AC
  Administered 2024-06-23: 75 mg via ORAL
  Filled 2024-06-23: qty 1

## 2024-06-23 MED ORDER — DILTIAZEM HCL ER COATED BEADS 180 MG PO CP24
180.0000 mg | ORAL_CAPSULE | Freq: Every morning | ORAL | Status: DC
  Administered 2024-06-23: 180 mg via ORAL
  Filled 2024-06-23: qty 1

## 2024-06-23 NOTE — Evaluation (Signed)
 Occupational Therapy Evaluation Patient Details Name: Darrell Perez MRN: [id number] DOB: January 11, 1941 Today's Date: 06/23/2024   History of Present Illness   Darrell Perez is a 83 y.o. M who presented to [facility] ED 06/22/24 for generalized weakness. Wife reports pt urinated in his recliner chair and then was unable to get up after trying for ~2 hours. Influenza A PCR positive. UA (-). PMHx: dementia, hx of atrial flutter in the setting of sepsis, asthma, HTN, HLD, IBS, nephrolithiasis, GERD, OSA on CPAP, and gout.     Clinical Impressions At baseline, pt is Independent with ADLs, Mod I for functional mobility with a hurrycane, and receives assistance from family for IADLs. Pt with a history of falls. Pt now presents with decreased activity tolerance, decreased L shoulder strength, decreased B UE coordination (Left >Right), impaired cognition (at or near baseline), decreased balance, decreased knowledge of DME/AE, and decreased safety and independence with functional tasks. Pt currently demonstrates ability to complete ADLs with Set up to Max assist +2, bed mobility with Min to Mod assist +1 to +2, and functional transfers with +2 HHA with Max assist +2. Recommend use of RW for mobility and for ADLs in standing for increased safety at this time. Pt participated well in session, is motivated to return to PLOF, and has good family support. Pt will benefit from acute skilled OT services to address deficits and increase safety and independence with functional tasks. Pt and wife express desire for pt to return home with assistance of family with wife reporting she feels able/comfortable providing care for pt at current level of assistance. Due to this, OT recommends post-acute HH OT to maximize rehab potential, decrease risk of falls, and decrease risk of rehospitalization.      If plan is discharge home, recommend the following:   Two people to help with walking and/or transfers;Two people to help with  bathing/dressing/bathroom;Assistance with cooking/housework;Direct supervision/assist for medications management;Direct supervision/assist for financial management;Assist for transportation;Help with stairs or ramp for entrance;Supervision due to cognitive status     Functional Status Assessment   Patient has had a recent decline in their functional status and demonstrates the ability to make significant improvements in function in a reasonable and predictable amount of time.     Equipment Recommendations   BSC/3in1     Recommendations for Other Services         Precautions/Restrictions   Precautions Precautions: Fall Recall of Precautions/Restrictions: Impaired Precaution/Restrictions Comments: Primary School Teacher Restrictions Weight Bearing Restrictions Per Provider Order: No     Mobility Bed Mobility Overal bed mobility: Needs Assistance Bed Mobility: Supine to Sit, Sit to Supine     Supine to sit: Mod assist, +2 for safety/equipment, Used rails, HOB elevated, +2 for physical assistance Sit to supine: Min assist, Mod assist   General bed mobility comments: cues for hand placement/techniqu and safety    Transfers Overall transfer level: Needs assistance Equipment used: 2 person hand held assist Transfers: Sit to/from Stand Sit to Stand: Min assist, Mod assist, +2 physical assistance           General transfer comment: Mod assist +2 to boost up from bed on lowest setting; Min assist +2 once in standing; able to take a few steps at side of bed with Min assist HHA+2; cues for safety      Balance Overall balance assessment: Needs assistance, History of Falls Sitting-balance support: Single extremity supported, No upper extremity supported, Feet supported Sitting balance-Leahy Scale: Fair Sitting balance -  Comments: Pt initally with L lateral lean in sitting but was able to correct with CGA-Min assist and maintain static sitting in midline after correction;  required support for dynamic sitting Postural control: Left lateral lean Standing balance support: Bilateral upper extremity supported, During functional activity (Reliant of HHA +2) Standing balance-Leahy Scale: Poor Standing balance comment: Reliant on B UE support in static and dynamic standing                           ADL either performed or assessed with clinical judgement   ADL Overall ADL's : Needs assistance/impaired Eating/Feeding: Set up;Sitting   Grooming: Set up;Supervision/safety;Sitting   Upper Body Bathing: Set up;Contact guard assist;Sitting   Lower Body Bathing: Moderate assistance;+2 for safety/equipment;+2 for physical assistance;Sit to/from stand   Upper Body Dressing : Contact guard assist;Minimal assistance;Sitting   Lower Body Dressing: Moderate assistance;Maximal assistance;+2 for physical assistance;+2 for safety/equipment;Sit to/from stand   Toilet Transfer: Minimal assistance;Moderate assistance;+2 for safety/equipment;+2 for physical assistance;BSC/3in1;Cueing for safety (+2 HHA; step-pivot) Toilet Transfer Details (indicate cue type and reason): simulated at EOB; <od assist +2 to boost up then Min assist +2 once in standing Toileting- Clothing Manipulation and Hygiene: Maximal assistance;+2 for safety/equipment;Sit to/from stand       Functional mobility during ADLs: Minimal assistance;+2 for physical assistance;+2 for safety/equipment (HHA +2) General ADL Comments: Recommend use of RW in standing during ADLs and during functional mobility. Pt with decreased activity tolerance     Vision Baseline Vision/History: 1 Wears glasses Ability to See in Adequate Light: 0 Adequate (with glasses on) Patient Visual Report: No change from baseline Additional Comments: Vision [facility] for tasks assessed with glasses on; not formally screened or evaluated     Perception         Praxis         Pertinent Vitals/Pain Pain Assessment Pain Assessment:  No/denies pain     Extremity/Trunk Assessment Upper Extremity Assessment Upper Extremity Assessment: Right hand dominant;LUE deficits/detail;RUE deficits/detail RUE Deficits / Details: gross strength 4/5; AROM WFL; mildly decreased fine motor coordination RUE Coordination: decreased fine motor (mild) LUE Deficits / Details: gross shoulder strength 3/5, all other strength grossly 4/5; mildly decreased shoulder AROM but WFL; all other ROM WFL; decreased fine and gross motor coordination LUE Coordination: decreased fine motor;decreased gross motor   Lower Extremity Assessment Lower Extremity Assessment: Defer to PT evaluation   Cervical / Trunk Assessment Cervical / Trunk Assessment: Kyphotic   Communication Communication Communication: No apparent difficulties Factors Affecting Communication: Other (comment) (requires mildly increased time for processing)   Cognition Arousal: Alert Behavior During Therapy: WFL for tasks assessed/performed Cognition: History of cognitive impairments             OT - Cognition Comments: Diagnosis of dementia at baseline. Pt oriented to self, wife, place, situaiton, and month. Not oriented to year. Pt able to follow 1-step commands and sequence familar tasks with mildly increased time. Requires cues for safety. Noted short-term memory and problem solving deficits. At or very near baseline cognition.                 Following commands: Impaired Following commands impaired: Only follows one step commands consistently, Follows one step commands with increased time, Follows multi-step commands inconsistently     Cueing  General Comments   Cueing Techniques: Verbal cues;Gestural cues;Tactile cues;Visual cues  Pt's wife present and supportive throughout session.   Exercises     Shoulder Instructions  Home Living Family/patient expects to be discharged to:: Private residence Living Arrangements: Spouse/significant other Available Help  at Discharge: Family;Available 24 hours/day (wife available 24/7; 2 sons live nearby and available to assist PRN) Type of Home: House Home Access: Ramped entrance     Home Layout: One level     Bathroom Shower/Tub: Producer, Television/film/video: Handicapped height     Home Equipment: Shower seat - built in;Hand held shower head;Grab bars - tub/shower;Lift chair;Cane - single point;Rolling Environmental Consultant (2 wheels);Rollator (4 wheels)   Additional Comments: Have a Collie/Shepard mix      Prior Functioning/Environment Prior Level of Function : Independent/Modified Independent;Needs assist       Physical Assist : ADLs (physical)   ADLs (physical): IADLs Mobility Comments: Pt was unable to stand out of his lift chair prior to admission. Ambulates using hurrycane. Wife reports he has LOB and then will call out so she can help him stabilize or he will grab onto something.  He has actually hit the floor a couple of times lately. Usually happens in the hall. ADLs Comments: Indep with ADLs. Wife reports he has toileting accidents occasionally, but not often. Wife manages all IADLs. He doesn't drive.    OT Problem List: Decreased activity tolerance;Impaired balance (sitting and/or standing);Decreased coordination;Decreased cognition;Decreased safety awareness;Decreased knowledge of use of DME or AE;Decreased knowledge of precautions   OT Treatment/Interventions: Self-care/ADL training;Energy conservation;DME and/or AE instruction;Therapeutic activities;Cognitive remediation/compensation;Patient/family education;Balance training      OT Goals(Current goals can be found in the care plan section)   Acute Rehab OT Goals Patient Stated Goal: to return home OT Goal Formulation: With patient/family Time For Goal Achievement: 07/07/24 Potential to Achieve Goals: Good ADL Goals Pt Will Perform Grooming: with contact guard assist;standing Pt Will Perform Lower Body Bathing: with min assist;sit  to/from stand Pt Will Perform Lower Body Dressing: with min assist;sit to/from stand Pt Will Transfer to Toilet: with contact guard assist;ambulating;regular height toilet (with least restricitve AD)   OT Frequency:  Min 2X/week    Co-evaluation PT/OT/SLP Co-Evaluation/Treatment: Yes Reason for Co-Treatment: For patient/therapist safety;To address functional/ADL transfers   OT goals addressed during session: ADL's and self-care      AM-PAC OT 6 Clicks Daily Activity     Outcome Measure Help from another person eating meals?: A Little Help from another person taking care of personal grooming?: A Little Help from another person toileting, which includes using toliet, bedpan, or urinal?: A Lot Help from another person bathing (including washing, rinsing, drying)?: A Lot Help from another person to put on and taking off regular upper body clothing?: A Little Help from another person to put on and taking off regular lower body clothing?: A Lot 6 Click Score: 15   End of Session Equipment Utilized During Treatment: Gait belt Nurse Communication: Mobility status;Other (comment) (Pt's IV is beeping)  Activity Tolerance: Patient tolerated treatment well Patient left: in bed;with call bell/phone within reach;with bed alarm set;with family/visitor present  OT Visit Diagnosis: Unsteadiness on feet (R26.81);Other abnormalities of gait and mobility (R26.89);Other symptoms and signs involving cognitive function                Time: 8872-8842 OT Time Calculation (min): 30 min Charges:  OT General Charges $OT Visit: 1 Visit OT Evaluation $OT Eval Moderate Complexity: 1 Mod  Margarie Rockey HERO., OTR/L, MA Acute Rehab [phone]   Margarie FORBES Horns 06/23/2024, 12:25 PM

## 2024-06-23 NOTE — Plan of Care (Signed)

## 2024-06-23 NOTE — Plan of Care (Signed)

## 2024-06-23 NOTE — TOC Transition Note (Signed)
 Transition of Care [facility]) - Discharge Note   Patient Details  Name: Darrell Perez MRN: [id number] Date of Birth: 11/30/1940  [facility] CM/SW Contact:  Tom-Johnson, Harvest Muskrat, RN Phone Number: 06/23/2024, 2:23 PM   Clinical Narrative:     Patient is scheduled for discharge today.  Home health info, DME info, Outpatient f/u, hospital f/u and discharge instructions on AVS. Wife, Dickey at bedside and will transport at discharge.  No further ICM needs noted.      Final next level of care: Home w Home Health Services Barriers to Discharge: Barriers Resolved   Patient Goals and CMS Choice Patient states their goals for this hospitalization and ongoing recovery are:: To return home CMS Medicare.gov Compare Post Acute Care list provided to:: Patient Choice offered to / list presented to : Patient, Spouse      Discharge Placement                Patient to be transferred to facility by: Wife Name of family member notified: [facility]    Discharge Plan and Services Additional resources added to the After Visit Summary for                  DME Arranged: Wheelchair manual DME Agency: [employer] Date DME Agency Contacted: 06/23/24 Time DME Agency Contacted: [phone] Representative spoke with at DME Agency: [facility] HH Arranged: PT, OT, RN, Disease Management HH Agency: [facility] Date HH Agency Contacted: 06/23/24 Time HH Agency Contacted: [phone] Representative spoke with at [facility] Agency: [facility] [employer]  Social Drivers of Health (SDOH) Interventions SDOH Screenings   Food Insecurity: Unknown (06/23/2024)  Housing: Unknown (06/23/2024)  Transportation Needs: Unknown (06/23/2024)  Utilities: Not At Risk (06/23/2024)  Social Connections: Unknown (06/23/2024)  Tobacco Use: Low Risk (06/22/2024)     Readmission Risk Interventions     No data to display

## 2024-06-23 NOTE — H&P (Signed)
 " History and Physical    Darrell Perez FMW:[id number] DOB: 02-May-1941 DOA: 06/22/2024  PCP: Regino Slater, MD  Patient coming from: Home  Chief Complaint: Generalized weakness  HPI: Darrell Perez is a 83 y.o. male with medical history significant of dementia, history of atrial flutter in the setting of sepsis with no recurrence for several years and not on anticoagulation per cardiology, asthma, hypertension, hyperlipidemia, IBS, nephrolithiasis, GERD, OSA on CPAP, gout presenting with generalized weakness.  Patient is currently awake and alert.  He is oriented to person and place but confused and not able to tell me why he was brought into the ED.  He denies chest pain, shortness of breath, abdominal pain, or any dysuria.  History provided by his wife at bedside who states today patient was sitting in his recliner and urinated.  When she tried to get him up from the recliner, she noticed that he was too weak/lethargic and she kept trying for 2 hours and then finally called EMS.  No seizure-like activity reported.  Wife states patient has had some loose stools for the past few days but no watery diarrhea.  No vomiting.  ED Course: SpO2 92% on room air and was placed on 2 L [place] by EMS.  Vital signs on arrival: Temperature 102.6 F, pulse 72, respiratory rate 19, blood pressure 173/64, and SpO2 100% on 2 L [place].  Labs showing no leukocytosis or anemia, potassium 3.3, glucose 128, creatinine 1.13, normal LFTs, lactic acid 2.5> 0.9, influenza A PCR positive. UA with negative nitrite, trace leukocytes, 6-10 RBCs, 0-5 WBCs, and rare bacteria.  Chest x-ray showing no active disease.  CT head showing no acute intracranial abnormality.  Blood cultures collected.  Patient was given ceftriaxone  and 1 L normal saline.  Review of Systems:  Review of Systems  All other systems reviewed and are negative.   Past Medical History:  Diagnosis Date   Arthritis    Dementia (HCC)    GERD (gastroesophageal reflux  disease)    watches and NO ISSUES WHEN USING CPAP--  NO MEDS   Hiatal hernia    History of asthma    History of kidney stones    History of multinodular goiter    s/p  thyroid  lobectomy 1967   Hypertension    Irritable bowel syndrome    Left knee DJD    Left ureteral calculus    Nephrolithiasis    bilateral non-obstrucive per CT 03-23-2017   OSA on CPAP    cpap does not know settings    Paroxysmal atrial fibrillation (HCC) newly dx 03-25-2017 (during hospitlization for urosepsis)   A-fib -- Aflutter w/ RVR -- pt started on cardizem  and eliquis --- at discharge in [facility]/  cardiologist-  dr hochrein    Past Surgical History:  Procedure Laterality Date   CARPAL TUNNEL RELEASE Bilateral 1989  & 1991    CATARACT EXTRACTION W/ INTRAOCULAR LENS  IMPLANT, BILATERAL  2005  &  2012   CYSTOSCOPY WITH STENT PLACEMENT Left 03/23/2017   Procedure: CYSTOSCOPY, LEFT RETROGRADE WITH STENT PLACEMENT;  Surgeon: Carolee Sherwood JONETTA DOUGLAS, MD;  Location: [facility];  Service: Urology;  Laterality: Left;   CYSTOSCOPY/URETEROSCOPY/HOLMIUM LASER/STENT PLACEMENT Left 04/10/2017   Procedure: CYSTOSCOPY/URETEROSCOPY/HOLMIUM LASER/STENT PLACEMENT;  Surgeon: Nieves Cough, MD;  Location: [facility];  Service: Urology;  Laterality: Left;   EXTRACORPOREAL SHOCK WAVE LITHOTRIPSY     MULTIPLE   HEMIARTHROPLASTY RIGHT SHOULDER  2007   HOLMIUM LASER APPLICATION Left 11/23/2012  Procedure: HOLMIUM LASER APPLICATION;  Surgeon: Donnice Gwenyth Brooks, MD;  Location: [facility];  Service: Urology;  Laterality: Left;   KIDNEY STONE SURGERY  1972 and 1977   removal of stones. (OPEN PROCUDURE)   REVERSE SHOULDER ARTHROPLASTY  10/09/2011   Procedure: REVERSE SHOULDER ARTHROPLASTY;  Surgeon: Franky CHRISTELLA Pointer, MD;  Location: [facility];  Service: Orthopedics;  Laterality: Right;  right hardware removal and reverse shoulder arthroplasty   THYROID  LOBECTOMY  1967   goiter removed   TONSILLECTOMY     TOTAL  KNEE ARTHROPLASTY  07/22/2012   Procedure: TOTAL KNEE ARTHROPLASTY;  Surgeon: Reyes JAYSON Billing, MD;  Location: [facility];  Service: Orthopedics;  Laterality: Right;   TOTAL KNEE ARTHROPLASTY Left 12/16/2012   Procedure: LEFT TOTAL KNEE ARTHROPLASTY;  Surgeon: Reyes JAYSON Billing, MD;  Location: [facility];  Service: Orthopedics;  Laterality: Left;   TRANSTHORACIC ECHOCARDIOGRAM  03/25/2017   mild LVH, ef 55-60%/  mild LAE/ trivial PR/  mild MR   TRIGGER FINGER RELEASE Right 2006   URETEROSOPIC STONE EXTRACTION  2002     reports that he has never smoked. He has never used smokeless tobacco. He reports that he does not drink alcohol and does not use drugs.  Allergies[1]  Family History  Problem Relation Age of Onset   Dementia Mother    Dementia Father    Anesthesia problems Neg Hx    Hypotension Neg Hx    Malignant hyperthermia Neg Hx    Pseudochol deficiency Neg Hx     Prior to Admission medications  Medication Sig Start Date End Date Taking? Authorizing Provider  acetaminophen  (TYLENOL ) 500 MG tablet Take by mouth every 6 (six) hours as needed for mild pain, moderate pain, fever or headache.     [provider]  allopurinol  (ZYLOPRIM ) 100 MG tablet Take 100 mg by mouth every morning.     [provider]  cetirizine (ZYRTEC) 10 MG tablet 1 tablet    [provider]  cholecalciferol  (VITAMIN D) 1000 UNITS tablet Take 1,000 Units by mouth daily.    [provider]  diltiazem  (CARDIZEM  CD) 240 MG 24 hr capsule Take 1 capsule (240 mg total) by mouth every morning. Patient needs appointment Patient taking differently: Take 240 mg by mouth every morning. Patient needs appointment Pt takes BID 06/18/18   Lavona Agent, MD  escitalopram  (LEXAPRO ) 5 MG tablet Take 1 tablet (5 mg total) by mouth at bedtime. 09/11/22   Wertman, Sara E, PA-C  HYDROcodone -acetaminophen  (NORCO/VICODIN) 5-325 MG tablet Take 1 tablet by mouth every 6 (six) hours as needed for moderate pain.     [provider]  hyoscyamine  (ANASPAZ ) 0.125 MG TBDP disintergrating tablet Place 0.125 mg under the tongue every 6 (six) hours as needed for cramping.    [provider]  indapamide  (LOZOL ) 2.5 MG tablet Take 2.5 mg by mouth every evening.     [provider]  loperamide  (IMODIUM ) 2 MG capsule Take 1-2 mg by mouth as needed.    [provider]  pantoprazole  (PROTONIX ) 40 MG tablet Take 1 tablet by mouth daily. 07/24/17   [provider]  polyethylene glycol (MIRALAX  / GLYCOLAX ) packet Take 17 g by mouth daily. 03/27/17   Tobie Yetta CHRISTELLA, MD  potassium chloride  SA (K-DUR,KLOR-CON ) 20 MEQ tablet Take 20 mEq by mouth 2 (two) times daily.    [provider]  saccharomyces boulardii (FLORASTOR) 250 MG capsule 1 capsule    [provider]  tolterodine (  DETROL LA) 4 MG 24 hr capsule Take by mouth daily. 08/18/18   [provider]  valACYclovir  (VALTREX ) 1000 MG tablet Take 1 tablet (1,000 mg total) by mouth 3 (three) times daily. 08/19/22   Jerrol Agent, MD  vitamin B-12 (CYANOCOBALAMIN ) 1000 MCG tablet Take 1,000 mcg by mouth every morning.     [provider]    Physical Exam: Vitals:   06/22/24 2104  BP: (!) 173/64  Pulse: 72  Resp: 19  Temp: (!) 102.6 F (39.2 C)  TempSrc: Rectal  SpO2: 100%  Weight: 84.8 kg  Height: 5' 9 (1.753 m)    Physical Exam Vitals reviewed.  Constitutional:      General: He is not in acute distress. HENT:     Head: Normocephalic and atraumatic.  Eyes:     Extraocular Movements: Extraocular movements intact.  Cardiovascular:     Rate and Rhythm: Normal rate and regular rhythm.     Heart sounds: Normal heart sounds.  Pulmonary:     Effort: Pulmonary effort is normal. No respiratory distress.     Breath sounds: Normal breath sounds.  Abdominal:     General: Bowel sounds are normal.     Palpations: Abdomen is soft.     Tenderness: There is no abdominal tenderness. There is  no guarding.  Musculoskeletal:     Cervical back: Normal range of motion.     Right lower leg: No edema.     Left lower leg: No edema.  Skin:    General: Skin is warm and dry.  Neurological:     General: No focal deficit present.     Mental Status: He is alert.     Cranial Nerves: No cranial nerve deficit.     Sensory: No sensory deficit.     Motor: No weakness.     Labs on Admission: I have personally reviewed following labs and imaging studies  CBC: Recent Labs  Lab 06/22/24 2105  WBC 8.1  NEUTROABS 6.5  HGB 15.8  HCT 46.1  MCV 88.8  PLT 248   Basic Metabolic Panel: Recent Labs  Lab 06/22/24 2105  NA 137  K 3.3*  CL 98  CO2 29  GLUCOSE 128*  BUN 18  CREATININE 1.13  CALCIUM 9.0   GFR: Estimated Creatinine Clearance: 49.5 mL/min (by C-G formula based on SCr of 1.13 mg/dL). Liver Function Tests: Recent Labs  Lab 06/22/24 2105  AST 37  ALT 27  ALKPHOS 62  BILITOT 0.3  PROT 6.6  ALBUMIN  3.9   No results for input(s): LIPASE, AMYLASE in the last 168 hours. No results for input(s): AMMONIA in the last 168 hours. Coagulation Profile: No results for input(s): INR, PROTIME in the last 168 hours. Cardiac Enzymes: No results for input(s): CKTOTAL, CKMB, CKMBINDEX, TROPONINI in the last 168 hours. BNP (last 3 results) No results for input(s): PROBNP in the last 8760 hours. HbA1C: No results for input(s): HGBA1C in the last 72 hours. CBG: No results for input(s): GLUCAP in the last 168 hours. Lipid Profile: No results for input(s): CHOL, HDL, LDLCALC, TRIG, CHOLHDL, LDLDIRECT in the last 72 hours. Thyroid  Function Tests: No results for input(s): TSH, T4TOTAL, FREET4, T3FREE, THYROIDAB in the last 72 hours. Anemia Panel: No results for input(s): VITAMINB12, FOLATE, FERRITIN, TIBC, IRON, RETICCTPCT in the last 72 hours. Urine analysis:    Component Value Date/Time   COLORURINE YELLOW 06/22/2024  2105   APPEARANCEUR CLEAR 06/22/2024 2105   LABSPEC 1.020 06/22/2024 2105  PHURINE 5.0 06/22/2024 2105   GLUCOSEU NEGATIVE 06/22/2024 2105   HGBUR SMALL (A) 06/22/2024 2105   BILIRUBINUR NEGATIVE 06/22/2024 2105   KETONESUR NEGATIVE 06/22/2024 2105   PROTEINUR NEGATIVE 06/22/2024 2105   UROBILINOGEN 0.2 04/02/2013 1524   NITRITE NEGATIVE 06/22/2024 2105   LEUKOCYTESUR TRACE (A) 06/22/2024 2105    Radiological Exams on Admission: CT HEAD WO CONTRAST ( ) Result Date: 06/22/2024 EXAM: CT HEAD WITHOUT CONTRAST 06/22/2024 09:50:00 PM TECHNIQUE: CT of the head was performed without the administration of intravenous contrast. Automated exposure control, iterative reconstruction, and/or weight based adjustment of the mA/kV was utilized to reduce the radiation dose to as low as reasonably achievable. COMPARISON: Head CT 03/04/2019. CLINICAL HISTORY: Mental status change, unknown cause. FINDINGS: BRAIN AND VENTRICLES: No acute hemorrhage. No evidence of acute infarct. Mild to moderate cerebral cortical volume loss, with atrophic ventriculomegaly and mild small vessel disease in the cerebral white matter. There is slight cerebellar volume loss. No extra-axial collection. No mass effect or midline shift. Scattered calcific plaques in the carotid siphons with no hyperdense vessels. ORBITS: Evidence of prior lens replacements but no acute orbital findings. SINUSES: There is patchy opacification throughout the ethmoid air cells, mild to moderate membrane disease in the sphenoid and maxillary sinuses. No mastoid effusion is seen. The frontal sinus is clear. No sinus fluid levels. SOFT TISSUES AND SKULL: No acute soft tissue abnormality. No skull fracture. IMPRESSION: 1. No acute intracranial CT findings. 2. Mild to moderate cerebral cortical volume loss with atrophic ventriculomegaly and mild small vessel disease in the cerebral white matter. Stable exam. 3. . Multifocal sinus disease . Electronically signed by:  Francis Quam MD 06/22/2024 10:08 PM EST RP Workstation: [id number]   DG Chest Port 1 View Result Date: 06/22/2024 CLINICAL DATA:  Weakness fever EXAM: PORTABLE CHEST 1 VIEW COMPARISON:  08/19/2022 FINDINGS: Right shoulder replacement. No acute airspace disease or effusion. Upper normal cardiac size. Aortic atherosclerosis. IMPRESSION: No active disease. Electronically Signed   By: Luke Bun M.D.   On: 06/22/2024 21:20    EKG: Independently reviewed.  Sinus rhythm, borderline ST depressions in lateral leads.  No significant change since previous tracing.  Assessment and Plan  Influenza A infection Febrile but does not meet any other SIRS criteria.  Mild lactic acidosis likely due to dehydration, resolved after 1 L IV fluid bolus.  Not hypotensive.  No signs of sepsis at this time.  SpO2 reportedly 92% on room air with EMS.  Patient is currently saturating 99% on room air.  Chest x-ray without evidence of pneumonia.  Start 5-day course of Tamiflu .  Maintenance IV fluid.  Acetaminophen  as needed for fevers.  Droplet and contact precautions.  Follow-up blood cultures.  Acute metabolic encephalopathy Likely due to acute viral illness.  Patient is slightly confused and does have history of dementia.  CT head showing no acute intracranial abnormality and no focal neurodeficit on exam.  Continue to monitor.  Generalized weakness In the setting of acute viral illness.  PT/OT evaluation, fall precautions.  Mild hypokalemia Likely due to poor p.o. intake.  Replace potassium and monitor labs.  ?UTI UA with negative nitrite, trace leukocytes, 6-10 RBCs, 0-5 WBCs, and rare bacteria.  Patient was given ceftriaxone  in the ED.  Doubt UTI as he is not endorsing dysuria.  Will hold off on additional antibiotics at this time and follow-up urine culture.  Paroxysmal atrial flutter Per review of cardiology office note, history of atrial flutter in the setting of sepsis with no  recurrence for several years  and not on anticoagulation.  Currently in sinus rhythm but borderline bradycardic with heart rate in the low 60s.  He takes diltiazem  240 mg daily at home.  I have reduced the dose to 180 mg daily.  Continue cardiac monitoring.  Might have to reduce dose of diltiazem  further or consider holding if bradycardia worsens.  Will check TSH given generalized weakness and encephalopathy.  Asthma Stable, no signs of acute exacerbation.  Does not appear to be on inhalers at home.  Hypertension SBP currently in the 150s.  Continue diltiazem  as above.  IV hydralazine  PRN SBP >160.  GERD Continue Protonix .  OSA Continue CPAP at night.  Gout Continue allopurinol .  Mood disorder Continue Lexapro .  Chronic overactive bladder Placed on fesoterodine  which is hospital primary replacement for his home tolterodine.  DVT prophylaxis: Lovenox  Code Status: Full Code (discussed with the patient and his wife) Family Communication: Wife at bedside. Admission status: It is my clinical opinion that referral for OBSERVATION is reasonable and necessary in this patient based on the above information provided. The aforementioned taken together are felt to place the patient at high risk for further clinical deterioration. However, it is anticipated that the patient may be medically stable for discharge from the hospital within 24 to 48 hours.   Editha Ram MD [facility]  If 7PM-7AM, please contact night-coverage [email or web address]  06/23/2024, 1:30 AM       [1]  Allergies Allergen Reactions   Donepezil Hcl Other (See Comments)   Galantamine  Other (See Comments)   Memantine      Other Reaction(s): weakness and confusion   Memantine  Hcl Other (See Comments)   Naprosyn [Naproxen] Other (See Comments)    makes me jittery/nervous   Sulfamethoxazole-Trimethoprim Other (See Comments)   "

## 2024-06-23 NOTE — Progress Notes (Signed)
" °  °  Durable Medical Equipment  (From admission, onward)           Start     Ordered   06/23/24 1331  For home use only DME standard manual wheelchair with seat cushion  Once       Comments: Patient suffers from weakness which impairs their ability to perform daily activities like dressing in the home.  A walker will not resolve issue with performing activities of daily living. A wheelchair will allow patient to safely perform daily activities. Patient can safely propel the wheelchair in the home or has a caregiver who can provide assistance. Length of need 6 months . Accessories: elevating leg rests (ELRs), wheel locks, extensions and anti-tippers.   06/23/24 1330            "

## 2024-06-23 NOTE — Evaluation (Signed)
 Physical Therapy Evaluation Patient Details Name: Darrell Perez MRN: [id number] DOB: 07/05/1940 Today's Date: 06/23/2024  History of Present Illness  Darrell Perez is a 83 y.o. M who presented to [facility] ED 06/22/24 for generalized weakness. Wife reports pt urinated in his recliner chair and then was unable to get up after trying for ~2 hours. Influenza A PCR positive. UA (-). PMHx: dementia, hx of atrial flutter in the setting of sepsis, asthma, HTN, HLD, IBS, nephrolithiasis, GERD, OSA on CPAP, and gout.   Clinical Impression  Pt admitted with above diagnosis. PTA, pt was modI for functional mobility using a hurrycane and independent with ADLs. He lives with his wife in a one story house with a ramped entrance. Pt currently with functional limitations due to the deficits listed below (see PT Problem List). He required min-modA x1-2 for bed mobility, modA x2 for sit<>stand using 2 HHA, and minA x2 to take a few steps using 2 HHA. Pt is currently limited by impaired cognition secondary to dementia (at or near baseline), decreased balance, poor safety awareness, decreased knowledge of DME, and impaired activity tolerance. Recommend use of RW for short-distance ambulation and manual w/c for long-distance ambulation. Pt will benefit from acute skilled PT to increase his independence and safety with mobility to allow discharge. Pt and wife express desire for pt to return home with assistance of family with wife reporting she feels able/comfortable providing care for pt at current level of assistance. Recommend HHPT to increased strength, improve balance, decrease fall risk, and optimize safety within the home environment.      If plan is discharge home, recommend the following: A lot of help with walking and/or transfers;A lot of help with bathing/dressing/bathroom;Assistance with cooking/housework;Assist for transportation;Help with stairs or ramp for entrance   Can travel by private vehicle         Equipment Recommendations Wheelchair (measurements PT)  Recommendations for Other Services       Functional Status Assessment Patient has had a recent decline in their functional status and demonstrates the ability to make significant improvements in function in a reasonable and predictable amount of time.     Precautions / Restrictions Precautions Precautions: Fall Recall of Precautions/Restrictions: Impaired Precaution/Restrictions Comments: Droplet Restrictions Weight Bearing Restrictions Per Provider Order: No      Mobility  Bed Mobility Overal bed mobility: Needs Assistance Bed Mobility: Supine to Sit, Sit to Supine     Supine to sit: Mod assist, +2 for safety/equipment, Used rails, HOB elevated Sit to supine: Min assist   General bed mobility comments: Pt sat up on L side of bed with increased time. Cues for hand placement/technique and safety. Assist via bed pad to bring BLE off EOB and elevate trunk. Returning to bed pt required light assist to bring BLE back in. Repositioned using bed features, bed pad, and +2 assist.    Transfers Overall transfer level: Needs assistance Equipment used: 2 person hand held assist Transfers: Sit to/from Stand Sit to Stand: Mod assist, +2 physical assistance           General transfer comment: Mod assist +2 to boost up from bed on lowest setting; Min assist +2 once in standing; Good eccentric control.    Ambulation/Gait Ambulation/Gait assistance: Min assist, +2 physical assistance Gait Distance (Feet): 3 Feet Assistive device: 2 person hand held assist Gait Pattern/deviations: Step-to pattern Gait velocity: decr     General Gait Details: Pt took a few steps forward away from the bed, backward  towards the bed, and to the left along EOB. Min assist HHA+2; cues for safety. Pt with flat foot contact and minimal foot clearence.  Stairs            Wheelchair Mobility     Tilt Bed    Modified Rankin (Stroke Patients  Only)       Balance Overall balance assessment: Needs assistance, History of Falls Sitting-balance support: Single extremity supported, No upper extremity supported, Feet supported Sitting balance-Leahy Scale: Fair Sitting balance - Comments: Pt initally with L lateral lean in sitting but was able to correct with CGA-Min assist and maintain static sitting in midline after correction; required support for dynamic sitting Postural control: Left lateral lean Standing balance support: Bilateral upper extremity supported, During functional activity (Reliant of HHA +2) Standing balance-Leahy Scale: Poor Standing balance comment: Reliant on B UE support in static and dynamic standing                             Pertinent Vitals/Pain Pain Assessment Pain Assessment: PAINAD Breathing: normal Negative Vocalization: none Facial Expression: smiling or inexpressive Body Language: relaxed Consolability: no need to console PAINAD Score: 0 Pain Intervention(s): Monitored during session    Home Living Family/patient expects to be discharged to:: Private residence Living Arrangements: Spouse/significant other Available Help at Discharge: Family;Available 24 hours/day (wife available 24/7; 2 sons live nearby and available to assist PRN) Type of Home: House Home Access: Ramped entrance       Home Layout: One level Home Equipment: Shower seat - built in;Hand held shower head;Grab bars - tub/shower;Lift chair;Cane - single point;Rolling Walker (2 wheels);Rollator (4 wheels) Additional Comments: Have a Collie/Shepard mix    Prior Function Prior Level of Function : Independent/Modified Independent;Needs assist;History of Falls (last six months)       Physical Assist : ADLs (physical)   ADLs (physical): IADLs Mobility Comments: Pt was unable to stand out of his lift chair prior to admission. Ambulates using hurrycane. Wife reports he has LOB and then will call out so she can help him  stabilize or he will grab onto something.  He has actually hit the floor a couple of times lately. Usually happens in the hall. ADLs Comments: Indep with ADLs. Wife reports he has toileting accidents occasionally, but not often. Wife manages all IADLs. He doesn't drive.     Extremity/Trunk Assessment   Upper Extremity Assessment Upper Extremity Assessment: Defer to OT evaluation RUE Deficits / Details: gross strength 4/5; AROM WFL; mildly decreased fine motor coordination RUE Coordination: decreased fine motor (mild) LUE Deficits / Details: gross shoulder strength 3/5, all other strength grossly 4/5; mildly decreased shoulder AROM but WFL; all other ROM WFL; decreased fine and gross motor coordination LUE Coordination: decreased fine motor;decreased gross motor    Lower Extremity Assessment Lower Extremity Assessment: RLE deficits/detail;LLE deficits/detail RLE Deficits / Details: Hip strength 3+/5, Knee strength 3-/5, and Ankle strength 4/5 RLE Sensation: WNL RLE Coordination: decreased gross motor LLE Deficits / Details: Hip strength 3/5, Knee strength 3-/5, and Ankle strength 3+/5 LLE Sensation: WNL LLE Coordination: decreased gross motor    Cervical / Trunk Assessment Cervical / Trunk Assessment: Kyphotic  Communication   Communication Communication: No apparent difficulties Factors Affecting Communication: Other (comment) (requires mildly increased time for processing)    Cognition Arousal: Alert Behavior During Therapy: WFL for tasks assessed/performed   PT - Cognitive impairments: History of cognitive impairments, Orientation (Dementia)   Orientation  impairments: Time, Situation (Pt took increased time to identify the month, prompted that it was a big holiday. He stated the year as 1965. Unsure why he was in the hospital.)                   PT - Cognition Comments: Pt A,Ox2 Following commands: Impaired Following commands impaired: Only follows one step commands  consistently, Follows one step commands with increased time, Follows multi-step commands inconsistently     Cueing Cueing Techniques: Verbal cues, Gestural cues, Tactile cues, Visual cues     General Comments General comments (skin integrity, edema, etc.): Pt's wife present and supportive throughout session. Pt on RA with SpO2 >93%.    Exercises     Assessment/Plan    PT Assessment Patient needs continued PT services  PT Problem List Decreased strength;Decreased activity tolerance;Decreased balance;Decreased mobility;Decreased knowledge of use of DME;Decreased safety awareness       PT Treatment Interventions DME instruction;Gait training;Functional mobility training;Therapeutic activities;Therapeutic exercise;Balance training;Patient/family education    PT Goals (Current goals can be found in the Care Plan section)  Acute Rehab PT Goals Patient Stated Goal: Return Home PT Goal Formulation: With patient/family Time For Goal Achievement: 07/07/24 Potential to Achieve Goals: Good    Frequency Min 2X/week     Co-evaluation   Reason for Co-Treatment: For patient/therapist safety;To address functional/ADL transfers PT goals addressed during session: Mobility/safety with mobility;Balance OT goals addressed during session: ADL's and self-care       AM-PAC PT 6 Clicks Mobility  Outcome Measure Help needed turning from your back to your side while in a flat bed without using bedrails?: A Lot Help needed moving from lying on your back to sitting on the side of a flat bed without using bedrails?: A Lot Help needed moving to and from a bed to a chair (including a wheelchair)?: A Lot Help needed standing up from a chair using your arms (e.g., wheelchair or bedside chair)?: A Lot Help needed to walk in hospital room?: A Lot Help needed climbing 3-5 steps with a railing? : A Lot 6 Click Score: 12    End of Session Equipment Utilized During Treatment: Gait belt Activity  Tolerance: Patient tolerated treatment well;Patient limited by fatigue Patient left: in bed;with call bell/phone within reach;with bed alarm set;with family/visitor present Nurse Communication: Mobility status PT Visit Diagnosis: Difficulty in walking, not elsewhere classified (R26.2);Muscle weakness (generalized) (M62.81);Other abnormalities of gait and mobility (R26.89);Unsteadiness on feet (R26.81)    Time: 8872-8842 PT Time Calculation (min) (ACUTE ONLY): 30 min   Charges:   PT Evaluation $PT Eval Moderate Complexity: 1 Mod   PT General Charges $$ ACUTE PT VISIT: 1 Visit         Randall SAUNDERS, PT, DPT Acute Rehabilitation Services Office: [phone] Secure Chat Preferred  Darrell Perez 06/23/2024, 1:05 PM

## 2024-06-23 NOTE — Discharge Summary (Signed)
 Physician Discharge Summary  Darrell Perez FMW:[id number] DOB: 03/29/1941 DOA: 06/22/2024  PCP: Regino Slater, MD  Admit date: 06/22/2024 Discharge date: 06/23/2024  Admitted From: (Home) Disposition:  (Home )  Recommendations for Outpatient Follow-up:  Follow up with PCP in 1-2 weeks Please obtain BMP/CBC in one week   Home Health: (YES) Equipment/Devices: wheelchair   Diet recommendation: Heart Healthy  Brief/Interim Summary: Darrell Perez is a 83 y.o. male with medical history significant of dementia, history of atrial flutter in the setting of sepsis with no recurrence for several years and not on anticoagulation per cardiology, asthma, hypertension, hyperlipidemia, IBS, nephrolithiasis, GERD, OSA on CPAP, gout presenting with generalized weakness.  His workup was significant for influenza A infection, febrile 102.6, so he was admitted for observation overnight.    Influenza A infection Febrile but does not meet any other SIRS criteria.  Mild lactic acidosis likely due to dehydration, resolved after 1 L IV fluid bolus.  Not hypotensive.  No signs of sepsis at this time.  SpO2 reportedly 92% on room air with EMS.  He was monitored overnight, he maintained 100% on room air, no wheezing, and denies any dyspnea, he was started on Tamiflu  course, I have discussed with him, and wife at bedside, would like to go home today, so I have sent his Tamiflu  to [facility] in [place] per their request and confirmed by pharmacy they have his current dose and they are open till 6 PM, and son and his way to pick up prescription. - Wife has spent the night at his bedside without wearing a mask, so I have given her prescription as well for Tamiflu  dose for influenza prophylaxis   Acute metabolic encephalopathy Likely due to acute viral illness.  Patient is slightly confused and does have history of dementia.  CT head showing no acute intracranial abnormality and no focal neurodeficit on exam.  Has  resolved, back at baseline   Generalized weakness In the setting of acute viral illness.  PT/OT evaluation appreciated, home health PT/OT has been arranged with DME wheelchair per recommendation   Mild hypokalemia Replaced   Abnormal UA UA with negative nitrite, trace leukocytes, 6-10 RBCs, 0-5 WBCs, and rare bacteria.  Patient was given ceftriaxone  in the ED.  Doubt UTI as he is not endorsing dysuria.  Will hold off on additional antibiotics at this time and follow-up urine culture.   Paroxysmal atrial flutter Per review of cardiology office note, history of atrial flutter in the setting of sepsis with no recurrence for several years and not on anticoagulation.  Currently in sinus rhythm heart rate in the 60s and 70s during hospital stay, so I will continue his home dose of Cardizem    Asthma Stable, no signs of acute exacerbation.  Does not appear to be on inhalers at home.   Hypertension Continue with home medications GERD Continue Protonix .   OSA Continue CPAP at night.   Gout Continue allopurinol .   Mood disorder Continue Lexapro .   Chronic overactive bladder Placed on fesoterodine  which is hospital primary replacement for his home tolterodine.    Discharge Diagnoses:  Principal Problem:   Influenza A Active Problems:   Paroxysmal atrial flutter (HCC)   Acute metabolic encephalopathy   Generalized weakness   Hypokalemia    Discharge Instructions  Discharge Instructions     Increase activity slowly   Complete by: As directed       Allergies as of 06/23/2024       Reactions   Donepezil  Hcl Other (See Comments)   Galantamine  Other (See Comments)   Memantine     Other Reaction(s): weakness and confusion   Memantine  Hcl Other (See Comments)   Naprosyn [naproxen] Other (See Comments)   makes me jittery/nervous   Sulfamethoxazole-trimethoprim Other (See Comments)        Medication List     STOP taking these medications    valACYclovir  1000 MG  tablet Commonly known as: VALTREX        TAKE these medications    acetaminophen  325 MG tablet Commonly known as: TYLENOL  Take 2 tablets (650 mg total) by mouth every 6 (six) hours as needed for mild pain (pain score 1-3) or fever (or Fever >/= 101). What changed:  medication strength how much to take reasons to take this   allopurinol  100 MG tablet Commonly known as: ZYLOPRIM  Take 100 mg by mouth every morning.   cetirizine 10 MG tablet Commonly known as: ZYRTEC 1 tablet   cholecalciferol  1000 units tablet Commonly known as: VITAMIN D Take 1,000 Units by mouth daily.   cyanocobalamin  1000 MCG tablet Commonly known as: VITAMIN B12 Take 1,000 mcg by mouth every morning.   diltiazem  240 MG 24 hr capsule Commonly known as: CARDIZEM  CD Take 1 capsule (240 mg total) by mouth every morning. Patient needs appointment What changed: additional instructions   escitalopram  5 MG tablet Commonly known as: Lexapro  Take 1 tablet (5 mg total) by mouth at bedtime.   HYDROcodone -acetaminophen  5-325 MG tablet Commonly known as: NORCO/VICODIN Take 1 tablet by mouth every 6 (six) hours as needed for moderate pain.   hyoscyamine  0.125 MG Tbdp disintergrating tablet Commonly known as: ANASPAZ  Place 0.125 mg under the tongue every 6 (six) hours as needed for cramping.   indapamide  2.5 MG tablet Commonly known as: LOZOL  Take 2.5 mg by mouth every evening.   loperamide  2 MG capsule Commonly known as: IMODIUM  Take 1-2 mg by mouth as needed.   oseltamivir  30 MG capsule Commonly known as: TAMIFLU  Take 1 capsule (30 mg total) by mouth 2 (two) times daily for 10 doses.   pantoprazole  40 MG tablet Commonly known as: PROTONIX  Take 1 tablet by mouth daily.   polyethylene glycol 17 g packet Commonly known as: MIRALAX  / GLYCOLAX  Take 17 g by mouth daily.   potassium chloride  SA 20 MEQ tablet Commonly known as: KLOR-CON  M Take 20 mEq by mouth 2 (two) times daily.   saccharomyces  boulardii 250 MG capsule Commonly known as: FLORASTOR 1 capsule   tolterodine 4 MG 24 hr capsule Commonly known as: DETROL LA Take by mouth daily.               Durable Medical Equipment  (From admission, onward)           Start     Ordered   06/23/24 1331  For home use only DME standard manual wheelchair with seat cushion  Once       Comments: Patient suffers from weakness which impairs their ability to perform daily activities like dressing in the home.  A walker will not resolve issue with performing activities of daily living. A wheelchair will allow patient to safely perform daily activities. Patient can safely propel the wheelchair in the home or has a caregiver who can provide assistance. Length of need 6 months . Accessories: elevating leg rests (ELRs), wheel locks, extensions and anti-tippers.   06/23/24 1330            Allergies[1]  Consultations: None  Procedures/Studies: CT HEAD WO CONTRAST ( ) Result Date: 06/22/2024 EXAM: CT HEAD WITHOUT CONTRAST 06/22/2024 09:50:00 PM TECHNIQUE: CT of the head was performed without the administration of intravenous contrast. Automated exposure control, iterative reconstruction, and/or weight based adjustment of the mA/kV was utilized to reduce the radiation dose to as low as reasonably achievable. COMPARISON: Head CT 03/04/2019. CLINICAL HISTORY: Mental status change, unknown cause. FINDINGS: BRAIN AND VENTRICLES: No acute hemorrhage. No evidence of acute infarct. Mild to moderate cerebral cortical volume loss, with atrophic ventriculomegaly and mild small vessel disease in the cerebral white matter. There is slight cerebellar volume loss. No extra-axial collection. No mass effect or midline shift. Scattered calcific plaques in the carotid siphons with no hyperdense vessels. ORBITS: Evidence of prior lens replacements but no acute orbital findings. SINUSES: There is patchy opacification throughout the ethmoid air cells,  mild to moderate membrane disease in the sphenoid and maxillary sinuses. No mastoid effusion is seen. The frontal sinus is clear. No sinus fluid levels. SOFT TISSUES AND SKULL: No acute soft tissue abnormality. No skull fracture. IMPRESSION: 1. No acute intracranial CT findings. 2. Mild to moderate cerebral cortical volume loss with atrophic ventriculomegaly and mild small vessel disease in the cerebral white matter. Stable exam. 3. . Multifocal sinus disease . Electronically signed by: Francis Quam MD 06/22/2024 10:08 PM EST RP Workstation: [id number]   DG Chest Port 1 View Result Date: 06/22/2024 CLINICAL DATA:  Weakness fever EXAM: PORTABLE CHEST 1 VIEW COMPARISON:  08/19/2022 FINDINGS: Right shoulder replacement. No acute airspace disease or effusion. Upper normal cardiac size. Aortic atherosclerosis. IMPRESSION: No active disease. Electronically Signed   By: Luke Bun M.D.   On: 06/22/2024 21:20   (Echo, Carotid, EGD, Colonoscopy, ERCP)    Subjective: Patient with good appetite, he had a good breakfast and lunch, he denies any dyspnea  Discharge Exam: Vitals:   06/23/24 0942 06/23/24 1242  BP: (!) 150/65 135/65  Pulse: (!) 57 73  Resp: 18 16  Temp:  99.1 F (37.3 C)  SpO2: 94% 91%   Vitals:   06/23/24 0800 06/23/24 0832 06/23/24 0942 06/23/24 1242  BP: (!) 144/57 (!) 162/71 (!) 150/65 135/65  Pulse:  63 (!) 57 73  Resp: 20 18 18 16   Temp:  99 F (37.2 C)  99.1 F (37.3 C)  TempSrc:  Oral  Axillary  SpO2:   94% 91%  Weight:      Height:        General: Pt is alert, awake, frail, but in no apparent distress, pleasant Cardiovascular: RRR, S1/S2 +, no rubs, no gallops Respiratory: CTA bilaterally, no wheezing, no rhonchi Abdominal: Soft, NT, ND, bowel sounds + Extremities: no edema, no cyanosis    The results of significant diagnostics from this hospitalization (including imaging, microbiology, ancillary and laboratory) are listed below for reference.      Microbiology: Recent Results (from the past 240 hours)  Blood culture (routine x 2)     Status: None (Preliminary result)   Collection Time: 06/22/24  9:05 PM   Specimen: BLOOD LEFT ARM  Result Value Ref Range Status   Specimen Description BLOOD LEFT ARM  Final   Special Requests   Final    BOTTLES DRAWN AEROBIC AND ANAEROBIC Blood Culture adequate volume   Culture   Final    NO GROWTH < 24 HOURS Performed at [facility], [address] [place], [place] [address]    Report Status PENDING  Incomplete  Blood culture (routine x  2)     Status: None (Preliminary result)   Collection Time: 06/22/24  9:37 PM   Specimen: BLOOD RIGHT HAND  Result Value Ref Range Status   Specimen Description BLOOD RIGHT HAND  Final   Special Requests   Final    BOTTLES DRAWN AEROBIC AND ANAEROBIC Blood Culture adequate volume   Culture   Final    NO GROWTH < 24 HOURS Performed at [facility], [address], [place], [place] [address]    Report Status PENDING  Incomplete  Resp panel by RT-PCR (RSV, Flu A&B, Covid) Anterior Nasal Swab     Status: Abnormal   Collection Time: 06/22/24 11:56 PM   Specimen: Anterior Nasal Swab  Result Value Ref Range Status   SARS Coronavirus 2 by RT PCR NEGATIVE NEGATIVE Final   Influenza A by PCR POSITIVE (A) NEGATIVE Final   Influenza B by PCR NEGATIVE NEGATIVE Final    Comment: (NOTE) The Xpert Xpress SARS-CoV-2/FLU/RSV plus assay is intended as an aid in the diagnosis of influenza from Nasopharyngeal swab specimens and should not be used as a sole basis for treatment. Nasal washings and aspirates are unacceptable for Xpert Xpress SARS-CoV-2/FLU/RSV testing.  Fact Sheet for Patients: bloggercourse.com  Fact Sheet for Healthcare Providers: seriousbroker.it  This test is not yet approved or cleared by the [place]  FDA and has been authorized for detection and/or diagnosis of SARS-CoV-2  by FDA under an Emergency Use Authorization (EUA). This EUA will remain in effect (meaning this test can be used) for the duration of the COVID-19 declaration under Section 564(b)(1) of the Act, 21 U.S.C. section 360bbb-3(b)(1), unless the authorization is terminated or revoked.     Resp Syncytial Virus by PCR NEGATIVE NEGATIVE Final    Comment: (NOTE) Fact Sheet for Patients: bloggercourse.com  Fact Sheet for Healthcare Providers: seriousbroker.it  This test is not yet approved or cleared by the [place]  FDA and has been authorized for detection and/or diagnosis of SARS-CoV-2 by FDA under an Emergency Use Authorization (EUA). This EUA will remain in effect (meaning this test can be used) for the duration of the COVID-19 declaration under Section 564(b)(1) of the Act, 21 U.S.C. section 360bbb-3(b)(1), unless the authorization is terminated or revoked.  Performed at [facility], [address], [place], [place] [address]      Labs: BNP (last 3 results) No results for input(s): BNP in the last 8760 hours. Basic Metabolic Panel: Recent Labs  Lab 06/22/24 2105 06/23/24 0318  NA 137 138  K 3.3* 3.7  CL 98 101  CO2 29 25  GLUCOSE 128* 94  BUN 18 14  CREATININE 1.13 1.04  CALCIUM 9.0 8.6*  MG  --  1.6*  PHOS  --  3.0   Liver Function Tests: Recent Labs  Lab 06/22/24 2105  AST 37  ALT 27  ALKPHOS 62  BILITOT 0.3  PROT 6.6  ALBUMIN  3.9   No results for input(s): LIPASE, AMYLASE in the last 168 hours. No results for input(s): AMMONIA in the last 168 hours. CBC: Recent Labs  Lab 06/22/24 2105  WBC 8.1  NEUTROABS 6.5  HGB 15.8  HCT 46.1  MCV 88.8  PLT 248   Cardiac Enzymes: No results for input(s): CKTOTAL, CKMB, CKMBINDEX, TROPONINI in the last 168 hours. BNP: Invalid input(s): POCBNP CBG: No results for input(s): GLUCAP in the last 168 hours. D-Dimer No results for  input(s): DDIMER in the last 72 hours. Hgb A1c No results for  input(s): HGBA1C in the last 72 hours. Lipid Profile No results for input(s): CHOL, HDL, LDLCALC, TRIG, CHOLHDL, LDLDIRECT in the last 72 hours. Thyroid  function studies Recent Labs    06/23/24 0318  TSH 0.858   Anemia work up No results for input(s): VITAMINB12, FOLATE, FERRITIN, TIBC, IRON, RETICCTPCT in the last 72 hours. Urinalysis    Component Value Date/Time   COLORURINE YELLOW 06/22/2024 2105   APPEARANCEUR CLEAR 06/22/2024 2105   LABSPEC 1.020 06/22/2024 2105   PHURINE 5.0 06/22/2024 2105   GLUCOSEU NEGATIVE 06/22/2024 2105   HGBUR SMALL (A) 06/22/2024 2105   BILIRUBINUR NEGATIVE 06/22/2024 2105   KETONESUR NEGATIVE 06/22/2024 2105   PROTEINUR NEGATIVE 06/22/2024 2105   UROBILINOGEN 0.2 04/02/2013 1524   NITRITE NEGATIVE 06/22/2024 2105   LEUKOCYTESUR TRACE (A) 06/22/2024 2105   Sepsis Labs Recent Labs  Lab 06/22/24 2105  WBC 8.1   Microbiology Recent Results (from the past 240 hours)  Blood culture (routine x 2)     Status: None (Preliminary result)   Collection Time: 06/22/24  9:05 PM   Specimen: BLOOD LEFT ARM  Result Value Ref Range Status   Specimen Description BLOOD LEFT ARM  Final   Special Requests   Final    BOTTLES DRAWN AEROBIC AND ANAEROBIC Blood Culture adequate volume   Culture   Final    NO GROWTH < 24 HOURS Performed at [facility], [address] [place], [place] [address]    Report Status PENDING  Incomplete  Blood culture (routine x 2)     Status: None (Preliminary result)   Collection Time: 06/22/24  9:37 PM   Specimen: BLOOD RIGHT HAND  Result Value Ref Range Status   Specimen Description BLOOD RIGHT HAND  Final   Special Requests   Final    BOTTLES DRAWN AEROBIC AND ANAEROBIC Blood Culture adequate volume   Culture   Final    NO GROWTH < 24 HOURS Performed at [facility], [address], [place], [place] [address]    Report  Status PENDING  Incomplete  Resp panel by RT-PCR (RSV, Flu A&B, Covid) Anterior Nasal Swab     Status: Abnormal   Collection Time: 06/22/24 11:56 PM   Specimen: Anterior Nasal Swab  Result Value Ref Range Status   SARS Coronavirus 2 by RT PCR NEGATIVE NEGATIVE Final   Influenza A by PCR POSITIVE (A) NEGATIVE Final   Influenza B by PCR NEGATIVE NEGATIVE Final    Comment: (NOTE) The Xpert Xpress SARS-CoV-2/FLU/RSV plus assay is intended as an aid in the diagnosis of influenza from Nasopharyngeal swab specimens and should not be used as a sole basis for treatment. Nasal washings and aspirates are unacceptable for Xpert Xpress SARS-CoV-2/FLU/RSV testing.  Fact Sheet for Patients: bloggercourse.com  Fact Sheet for Healthcare Providers: seriousbroker.it  This test is not yet approved or cleared by the [place]  FDA and has been authorized for detection and/or diagnosis of SARS-CoV-2 by FDA under an Emergency Use Authorization (EUA). This EUA will remain in effect (meaning this test can be used) for the duration of the COVID-19 declaration under Section 564(b)(1) of the Act, 21 U.S.C. section 360bbb-3(b)(1), unless the authorization is terminated or revoked.     Resp Syncytial Virus by PCR NEGATIVE NEGATIVE Final    Comment: (NOTE) Fact Sheet for Patients: bloggercourse.com  Fact Sheet for Healthcare Providers: seriousbroker.it  This test is not yet approved or cleared by the [place]  FDA and has been authorized for detection  and/or diagnosis of SARS-CoV-2 by FDA under an Emergency Use Authorization (EUA). This EUA will remain in effect (meaning this test can be used) for the duration of the COVID-19 declaration under Section 564(b)(1) of the Act, 21 U.S.C. section 360bbb-3(b)(1), unless the authorization is terminated or revoked.  Performed at [facility],  [address], [place], [place] [address]      Time coordinating discharge: Over 30 minutes  SIGNED:   Brayton Lye, MD  [facility] 06/23/2024, 1:48 PM Pager   If 7PM-7AM, please contact night-coverage [email or web address]     [1]  Allergies Allergen Reactions   Donepezil Hcl Other (See Comments)   Galantamine  Other (See Comments)   Memantine      Other Reaction(s): weakness and confusion   Memantine  Hcl Other (See Comments)   Naprosyn [Naproxen] Other (See Comments)    makes me jittery/nervous   Sulfamethoxazole-Trimethoprim Other (See Comments)

## 2024-06-24 LAB — BLOOD CULTURE ID PANEL (REFLEXED) - BCID2

## 2024-06-24 NOTE — Progress Notes (Signed)
 PHARMACY - PHYSICIAN COMMUNICATION CRITICAL VALUE ALERT - BLOOD CULTURE IDENTIFICATION (BCID)  Darrell Perez is an 83 y.o. male who presented to [facility] on 06/22/2024 with a chief complaint of weakness.    Assessment:  One blood culture positive for staph species, suspect contamination.  Also positive for influenza A  Name of physician (or Provider) Contacted: [place]  Current antibiotics: None  Changes to prescribed antibiotics recommended: None Patient is on recommended antibiotics - No changes needed  Results for orders placed or performed during the hospital encounter of 06/22/24  Blood Culture ID Panel (Reflexed) (Collected: 06/22/2024  9:37 PM)  Result Value Ref Range   Enterococcus faecalis NOT DETECTED NOT DETECTED   Enterococcus Faecium NOT DETECTED NOT DETECTED   Listeria monocytogenes NOT DETECTED NOT DETECTED   Staphylococcus species DETECTED (A) NOT DETECTED   Staphylococcus aureus (BCID) NOT DETECTED NOT DETECTED   Staphylococcus epidermidis NOT DETECTED NOT DETECTED   Staphylococcus lugdunensis NOT DETECTED NOT DETECTED   Streptococcus species NOT DETECTED NOT DETECTED   Streptococcus agalactiae NOT DETECTED NOT DETECTED   Streptococcus pneumoniae NOT DETECTED NOT DETECTED   Streptococcus pyogenes NOT DETECTED NOT DETECTED   A.calcoaceticus-baumannii NOT DETECTED NOT DETECTED   Bacteroides fragilis NOT DETECTED NOT DETECTED   Enterobacterales NOT DETECTED NOT DETECTED   Enterobacter cloacae complex NOT DETECTED NOT DETECTED   Escherichia coli NOT DETECTED NOT DETECTED   Klebsiella aerogenes NOT DETECTED NOT DETECTED   Klebsiella oxytoca NOT DETECTED NOT DETECTED   Klebsiella pneumoniae NOT DETECTED NOT DETECTED   Proteus species NOT DETECTED NOT DETECTED   Salmonella species NOT DETECTED NOT DETECTED   Serratia marcescens NOT DETECTED NOT DETECTED   Haemophilus influenzae NOT DETECTED NOT DETECTED   Neisseria meningitidis NOT DETECTED NOT DETECTED    Pseudomonas aeruginosa NOT DETECTED NOT DETECTED   Stenotrophomonas maltophilia NOT DETECTED NOT DETECTED   Candida albicans NOT DETECTED NOT DETECTED   Candida auris NOT DETECTED NOT DETECTED   Candida glabrata NOT DETECTED NOT DETECTED   Candida krusei NOT DETECTED NOT DETECTED   Candida parapsilosis NOT DETECTED NOT DETECTED   Candida tropicalis NOT DETECTED NOT DETECTED   Cryptococcus neoformans/gattii NOT DETECTED NOT DETECTED    Darrell Perez 06/24/2024  10:41 AM

## 2024-06-26 LAB — CULTURE, BLOOD (ROUTINE X 2): Special Requests: ADEQUATE

## 2024-06-27 LAB — CULTURE, BLOOD (ROUTINE X 2)
Culture: NO GROWTH
Special Requests: ADEQUATE

## 2024-07-27 ENCOUNTER — Ambulatory Visit (INDEPENDENT_AMBULATORY_CARE_PROVIDER_SITE_OTHER)

## 2024-07-27 ENCOUNTER — Encounter: Payer: Self-pay | Admitting: Emergency Medicine

## 2024-07-27 ENCOUNTER — Ambulatory Visit: Admission: EM | Admit: 2024-07-27 | Discharge: 2024-07-27 | Disposition: A | Discharge status: Home / Self Care

## 2024-07-27 DIAGNOSIS — R3915 Urgency of urination: Secondary | ICD-10-CM | POA: Insufficient documentation

## 2024-07-27 DIAGNOSIS — S40012A Contusion of left shoulder, initial encounter: Secondary | ICD-10-CM | POA: Diagnosis not present

## 2024-07-27 DIAGNOSIS — M179 Osteoarthritis of knee, unspecified: Secondary | ICD-10-CM | POA: Insufficient documentation

## 2024-07-27 DIAGNOSIS — N529 Male erectile dysfunction, unspecified: Secondary | ICD-10-CM | POA: Insufficient documentation

## 2024-07-27 DIAGNOSIS — E669 Obesity, unspecified: Secondary | ICD-10-CM | POA: Insufficient documentation

## 2024-07-27 DIAGNOSIS — K5730 Diverticulosis of large intestine without perforation or abscess without bleeding: Secondary | ICD-10-CM | POA: Insufficient documentation

## 2024-07-27 DIAGNOSIS — F0280 Dementia in other diseases classified elsewhere without behavioral disturbance: Secondary | ICD-10-CM | POA: Insufficient documentation

## 2024-07-27 DIAGNOSIS — I714 Abdominal aortic aneurysm, without rupture, unspecified: Secondary | ICD-10-CM | POA: Insufficient documentation

## 2024-07-27 DIAGNOSIS — I700 Atherosclerosis of aorta: Secondary | ICD-10-CM | POA: Insufficient documentation

## 2024-07-27 DIAGNOSIS — S0990XA Unspecified injury of head, initial encounter: Secondary | ICD-10-CM

## 2024-07-27 DIAGNOSIS — R202 Paresthesia of skin: Secondary | ICD-10-CM | POA: Insufficient documentation

## 2024-07-27 DIAGNOSIS — I480 Paroxysmal atrial fibrillation: Secondary | ICD-10-CM | POA: Insufficient documentation

## 2024-07-27 DIAGNOSIS — F02A0 Dementia in other diseases classified elsewhere, mild, without behavioral disturbance, psychotic disturbance, mood disturbance, and anxiety: Secondary | ICD-10-CM | POA: Insufficient documentation

## 2024-07-27 DIAGNOSIS — R209 Unspecified disturbances of skin sensation: Secondary | ICD-10-CM | POA: Insufficient documentation

## 2024-07-27 DIAGNOSIS — R197 Diarrhea, unspecified: Secondary | ICD-10-CM | POA: Insufficient documentation

## 2024-07-27 DIAGNOSIS — S4992XA Unspecified injury of left shoulder and upper arm, initial encounter: Secondary | ICD-10-CM | POA: Diagnosis not present

## 2024-07-27 DIAGNOSIS — G8929 Other chronic pain: Secondary | ICD-10-CM | POA: Insufficient documentation

## 2024-07-27 DIAGNOSIS — H9190 Unspecified hearing loss, unspecified ear: Secondary | ICD-10-CM | POA: Insufficient documentation

## 2024-07-27 DIAGNOSIS — K760 Fatty (change of) liver, not elsewhere classified: Secondary | ICD-10-CM | POA: Insufficient documentation

## 2024-07-27 DIAGNOSIS — M5430 Sciatica, unspecified side: Secondary | ICD-10-CM | POA: Insufficient documentation

## 2024-07-27 DIAGNOSIS — N1831 Chronic kidney disease, stage 3a: Secondary | ICD-10-CM | POA: Insufficient documentation

## 2024-07-27 DIAGNOSIS — N401 Enlarged prostate with lower urinary tract symptoms: Secondary | ICD-10-CM | POA: Insufficient documentation

## 2024-07-27 DIAGNOSIS — G4733 Obstructive sleep apnea (adult) (pediatric): Secondary | ICD-10-CM | POA: Insufficient documentation

## 2024-07-27 DIAGNOSIS — K219 Gastro-esophageal reflux disease without esophagitis: Secondary | ICD-10-CM | POA: Insufficient documentation

## 2024-07-27 DIAGNOSIS — K2100 Gastro-esophageal reflux disease with esophagitis, without bleeding: Secondary | ICD-10-CM | POA: Insufficient documentation

## 2024-07-27 NOTE — ED Triage Notes (Signed)
 Pt presents c/o fall and L shoulder pain x 2 days. Pt states,  Monday night I had a bad fall. I fell backwards and landed on my shoulder. We called the fire dept and decided it was not bad enough to come out to the Urgent Care. Now the pain keeps getting worse and worse.  Pt denies LOC.

## 2024-07-27 NOTE — ED Provider Notes (Signed)
 " [facility]    CSN: [id number] Arrival date & time: 07/27/24  1238      History   Chief Complaint Chief Complaint  Patient presents with   Shoulder Pain   Shoulder Injury    HPI Darrell Perez is a 84 y.o. male.   Pt presents today due to left shoulder pain after fall on Monday. Pt states that he was checked on by EMS and advised that he did not need to go to urgent care immediately. Pt states that he took Tylenol  for pain. Pt maintains full ROM of left shoulder. Pt also states that he hit his head and has a small abrasion of scalp. Pt does not use blood thinners. Pt denies HA, dizziness, nausea, sensitivity to light, or blurred vision.   The history is provided by the patient.  Shoulder Pain Shoulder Injury    Past Medical History:  Diagnosis Date   Arthritis    Dementia (HCC)    GERD (gastroesophageal reflux disease)    watches and NO ISSUES WHEN USING CPAP--  NO MEDS   Hiatal hernia    History of asthma    History of kidney stones    History of multinodular goiter    s/p  thyroid  lobectomy 1967   Hypertension    Irritable bowel syndrome    Left knee DJD    Left ureteral calculus    Nephrolithiasis    bilateral non-obstrucive per CT 03-23-2017   OSA on CPAP    cpap does not know settings    Paroxysmal atrial fibrillation (HCC) newly dx 03-25-2017 (during hospitlization for urosepsis)   A-fib -- Aflutter w/ RVR -- pt started on cardizem  and eliquis --- at discharge in [facility]/  cardiologist-  dr hochrein    Patient Active Problem List   Diagnosis Date Noted   Abdominal aortic aneurysm (AAA) 07/27/2024   Obesity 07/27/2024   Chronic pain 07/27/2024   Diarrhea 07/27/2024   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   Erectile dysfunction 07/27/2024   Gastro-esophageal reflux disease without esophagitis 07/27/2024   Gastroesophageal reflux disease with esophagitis 07/27/2024   Hardening of the aorta (main artery of the heart) 07/27/2024   Hearing loss  07/27/2024   Lower urinary tract symptoms due to benign prostatic hyperplasia 07/27/2024   Obstructive sleep apnea syndrome 07/27/2024   Paresthesia 07/27/2024   Paroxysmal atrial fibrillation (HCC) 07/27/2024   Sciatica 07/27/2024   Skin sensation disturbance 07/27/2024   Stage 3a chronic kidney disease (HCC) 07/27/2024   Steatosis of liver 07/27/2024   Urinary urgency 07/27/2024   Osteoarthritis of knee 07/27/2024   Alzheimer's disease with late onset (HCC) 07/27/2024   Dementia in other diseases classified elsewhere, mild, without behavioral disturbance, psychotic disturbance, mood disturbance, and anxiety (HCC) 07/27/2024   Influenza A 06/23/2024   Acute metabolic encephalopathy 06/23/2024   Generalized weakness 06/23/2024   Hypokalemia 06/23/2024   Acquired trigger finger of right ring finger 07/16/2023   Pain in finger 07/16/2023   Memory loss 09/11/2022   Abnormality of gait due to impairment of balance 05/28/2022   Impairment of balance 05/28/2022   Mild late onset Alzheimer's dementia without behavioral disturbance, psychotic disturbance, mood disturbance, or anxiety (HCC) 03/14/2022   History of reverse total replacement of right shoulder joint 07/23/2021   Pain in joint of right shoulder 07/15/2021   Idiopathic peripheral neuropathy 05/17/2021   Closed fracture of patella 06/06/2019   Pain in right knee 05/12/2019   Degenerative spondylolisthesis 04/08/2018   Degeneration of  lumbar intervertebral disc 03/18/2018   Scoliosis of lumbar spine 03/18/2018   Spinal stenosis of lumbar region 03/18/2018   Mild dementia (HCC) 01/22/2018   History of total right knee replacement 01/21/2018   Paroxysmal atrial flutter (HCC) 04/02/2017   Anticoagulated 04/02/2017   Right carotid bruit 04/02/2017   Atrial flutter (HCC) 04/02/2017   Sepsis (HCC) 03/25/2017   Pyelonephritis 03/25/2017   Left ureteral calculus    Ureteral stone 03/23/2017   Nephrolithiasis 03/23/2017   Benign  prostatic hyperplasia without lower urinary tract symptoms 02/03/2013   Extrinsic asthma 01/27/2013   Benign essential hypertension 01/27/2013   Primary gout 01/27/2013   Left knee DJD 12/16/2012   Primary osteoarthritis involving multiple joints 12/16/2012   Right knee DJD 07/22/2012    Past Surgical History:  Procedure Laterality Date   CARPAL TUNNEL RELEASE Bilateral 1989  & 1991    CATARACT EXTRACTION W/ INTRAOCULAR LENS  IMPLANT, BILATERAL  2005  &  2012   CYSTOSCOPY WITH STENT PLACEMENT Left 03/23/2017   Procedure: CYSTOSCOPY, LEFT RETROGRADE WITH STENT PLACEMENT;  Surgeon: Carolee Sherwood JONETTA DOUGLAS, MD;  Location: [facility];  Service: Urology;  Laterality: Left;   CYSTOSCOPY/URETEROSCOPY/HOLMIUM LASER/STENT PLACEMENT Left 04/10/2017   Procedure: CYSTOSCOPY/URETEROSCOPY/HOLMIUM LASER/STENT PLACEMENT;  Surgeon: Nieves Cough, MD;  Location: [facility];  Service: Urology;  Laterality: Left;   EXTRACORPOREAL SHOCK WAVE LITHOTRIPSY     MULTIPLE   HEMIARTHROPLASTY RIGHT SHOULDER  2007   HOLMIUM LASER APPLICATION Left 11/23/2012   Procedure: HOLMIUM LASER APPLICATION;  Surgeon: Cough Gwenyth Nieves, MD;  Location: [facility];  Service: Urology;  Laterality: Left;   KIDNEY STONE SURGERY  1972 and 1977   removal of stones. (OPEN PROCUDURE)   REVERSE SHOULDER ARTHROPLASTY  10/09/2011   Procedure: REVERSE SHOULDER ARTHROPLASTY;  Surgeon: Franky CHRISTELLA Pointer, MD;  Location: [facility];  Service: Orthopedics;  Laterality: Right;  right hardware removal and reverse shoulder arthroplasty   THYROID  LOBECTOMY  1967   goiter removed   TONSILLECTOMY     TOTAL KNEE ARTHROPLASTY  07/22/2012   Procedure: TOTAL KNEE ARTHROPLASTY;  Surgeon: Reyes JAYSON Billing, MD;  Location: [facility];  Service: Orthopedics;  Laterality: Right;   TOTAL KNEE ARTHROPLASTY Left 12/16/2012   Procedure: LEFT TOTAL KNEE ARTHROPLASTY;  Surgeon: Reyes JAYSON Billing, MD;  Location: [facility];  Service: Orthopedics;   Laterality: Left;   TRANSTHORACIC ECHOCARDIOGRAM  03/25/2017   mild LVH, ef 55-60%/  mild LAE/ trivial PR/  mild MR   TRIGGER FINGER RELEASE Right 2006   URETEROSOPIC STONE EXTRACTION  2002       Home Medications    Prior to Admission medications  Medication Sig Start Date End Date Taking? Authorizing Provider  acetaminophen  (TYLENOL ) 325 MG tablet Take 2 tablets (650 mg total) by mouth every 6 (six) hours as needed for mild pain (pain score 1-3) or fever (or Fever >/= 101). 06/23/24  Yes Elgergawy, Brayton RAMAN, MD  allopurinol  (ZYLOPRIM ) 100 MG tablet Take 100 mg by mouth every morning.     [provider]  cetirizine (ZYRTEC) 10 MG tablet 1 tablet    [provider]  cholecalciferol  (VITAMIN D) 1000 UNITS tablet Take 1,000 Units by mouth daily.    [provider]  diltiazem  (CARDIZEM  CD) 240 MG 24 hr capsule Take 1 capsule (240 mg total) by mouth every morning. Patient needs appointment Patient taking differently: Take 240 mg by mouth every morning. Patient needs appointment Pt takes BID 06/18/18   Hochrein,  Lynwood, MD  escitalopram  (LEXAPRO ) 5 MG tablet Take 1 tablet (5 mg total) by mouth at bedtime. 09/11/22   Wertman, Sara E, PA-C  finasteride (PROSCAR) 5 MG tablet 1 tablet Orally Once a day    [provider]  HYDROcodone -acetaminophen  (NORCO/VICODIN) 5-325 MG tablet Take 1 tablet by mouth every 6 (six) hours as needed for moderate pain.    [provider]  hyoscyamine  (ANASPAZ ) 0.125 MG TBDP disintergrating tablet Place 0.125 mg under the tongue every 6 (six) hours as needed for cramping.    [provider]  indapamide  (LOZOL ) 2.5 MG tablet Take 2.5 mg by mouth every evening.     [provider]  loperamide  (IMODIUM ) 2 MG capsule Take 1-2 mg by mouth as needed.    [provider]  Loperamide -Simethicone (IMODIUM  MULTI-SYMPTOM RELIEF) 2-125 MG TABS as needed or diarrhea Orally    [provider]   pantoprazole  (PROTONIX ) 40 MG tablet Take 1 tablet by mouth daily. 07/24/17   [provider]  polyethylene glycol (MIRALAX  / GLYCOLAX ) packet Take 17 g by mouth daily. 03/27/17   Tobie Yetta HERO, MD  potassium chloride  SA (K-DUR,KLOR-CON ) 20 MEQ tablet Take 20 mEq by mouth 2 (two) times daily.    [provider]  Probiotic Product (MISC INTESTINAL FLORA REGULAT) CAPS 1 capsule.    [provider]  saccharomyces boulardii (FLORASTOR) 250 MG capsule 1 capsule    [provider]  tolterodine (DETROL LA) 4 MG 24 hr capsule Take by mouth daily. 08/18/18   [provider]  vitamin B-12 (CYANOCOBALAMIN ) 1000 MCG tablet Take 1,000 mcg by mouth every morning.     [provider]    Family History Family History  Problem Relation Age of Onset   Dementia Mother    Dementia Father    Anesthesia problems Neg Hx    Hypotension Neg Hx    Malignant hyperthermia Neg Hx    Pseudochol deficiency Neg Hx     Social History Social History[1]   Allergies   Donepezil, Donepezil hcl, Galantamine , Memantine , Memantine  hcl, Naprosyn [naproxen], and Sulfamethoxazole-trimethoprim   Review of Systems Review of Systems   Physical Exam Triage Vital Signs ED Triage Vitals  Encounter Vitals Group     BP 07/27/24 1318 (!) 171/74     Girls Systolic BP Percentile --      Girls Diastolic BP Percentile --      Boys Systolic BP Percentile --      Boys Diastolic BP Percentile --      Pulse Rate 07/27/24 1318 71     Resp 07/27/24 1318 18     Temp 07/27/24 1318 98.3 F (36.8 C)     Temp Source 07/27/24 1318 Oral     SpO2 07/27/24 1318 97 %     Weight 07/27/24 1316 191 lb 2.2 oz (86.7 kg)     Height --      Head Circumference --      Peak Flow --      Pain Score 07/27/24 1316 9     Pain Loc --      Pain Education --      Exclude from Growth Chart --    No data found.  Updated Vital Signs BP (!) 171/74 (BP Location: Left Arm)   Pulse 71   Temp  98.3 F (36.8 C) (Oral)   Resp 18   Wt 191 lb 2.2 oz (86.7 kg)   SpO2 97%   BMI 28.23 kg/m  Visual Acuity Right Eye Distance:   Left Eye Distance:   Bilateral Distance:    Right Eye Near:   Left Eye Near:    Bilateral Near:     Physical Exam Vitals and nursing note reviewed.  Constitutional:      General: He is not in acute distress.    Appearance: Normal appearance. He is not ill-appearing, toxic-appearing or diaphoretic.  HENT:     Head:   Eyes:     General: No scleral icterus. Cardiovascular:     Rate and Rhythm: Normal rate and regular rhythm.     Heart sounds: Normal heart sounds.  Pulmonary:     Effort: Pulmonary effort is normal. No respiratory distress.     Breath sounds: Normal breath sounds. No wheezing or rhonchi.  Musculoskeletal:     Left shoulder: Tenderness present. No swelling, deformity, effusion or laceration. Normal range of motion.       Arms:     Comments: Tenderness to palpation where indicated on diagram  Skin:    General: Skin is warm.  Neurological:     Mental Status: He is alert and oriented to person, place, and time.     GCS: GCS eye subscore is 4. GCS verbal subscore is 5. GCS motor subscore is 6.     Cranial Nerves: Cranial nerves 2-12 are intact. No facial asymmetry.     Sensory: Sensation is intact.     Motor: No weakness.     Gait: Gait is intact.  Psychiatric:        Mood and Affect: Mood normal.        Behavior: Behavior normal.      UC Treatments / Results  Labs (all labs ordered are listed, but only abnormal results are displayed) Labs Reviewed - No data to display  EKG   Radiology DG Shoulder Left Result Date: 07/27/2024 CLINICAL DATA:  Left shoulder injury. EXAM: LEFT SHOULDER - 2+ VIEW COMPARISON:  None Available. FINDINGS: No fracture or dislocation. Advanced degenerative changes in the glenohumeral joint with subchondral sclerosis and cyst formation as well as osteophytosis and loose bodies. Visualized left  chest is grossly unremarkable. IMPRESSION: Advanced left glenohumeral joint osteoarthritis.  No acute findings. Electronically Signed   By: Newell Eke M.D.   On: 07/27/2024 14:35    Procedures Procedures (including critical care time)  Medications Ordered in UC Medications - No data to display  Initial Impression / Assessment and Plan / UC Course  I have reviewed the triage vital signs and the nursing notes.  Pertinent labs & imaging results that were available during my care of the patient were reviewed by me and considered in my medical decision making (see chart for details).     Final Clinical Impressions(s) / UC Diagnoses   Final diagnoses:  Injury of left shoulder, initial encounter  Contusion of left shoulder, initial encounter  Injury of head, initial encounter     Discharge Instructions      No fracture of left shoulder, osteoarthritis noted of joint. May use Tylenol  and/or ibuprofen as needed for pain.   Because of patient's age it is recommended that he have a CT scan of head due to fall. This can be done in ED or be ordered by his PCP.     ED Prescriptions   None    PDMP not reviewed this encounter.    [1]  Social History Tobacco Use   Smoking status: Never   Smokeless tobacco: Never  Vaping Use  Vaping status: Never Used  Substance Use Topics   Alcohol use: No   Drug use: No     Andra Corean BROCKS, PA-C 07/27/24 1446  "

## 2024-07-27 NOTE — Discharge Instructions (Addendum)
 No fracture of left shoulder, osteoarthritis noted of joint. May use Tylenol  and/or ibuprofen as needed for pain.   Because of patient's age it is recommended that he have a CT scan of head due to fall. This can be done in ED or be ordered by his PCP.

## 2024-10-05 ENCOUNTER — Ambulatory Visit: Admitting: Physician Assistant
